# Patient Record
Sex: Female | Born: 1950 | Race: White | Hispanic: No | Smoking: Former smoker
Health system: Southern US, Community
[De-identification: ages and names within clinical notes are randomized; demographics above are authoritative.]

## PROBLEM LIST (undated history)

## (undated) DIAGNOSIS — M4800 Spinal stenosis, site unspecified: Secondary | ICD-10-CM

## (undated) DIAGNOSIS — C541 Malignant neoplasm of endometrium: Secondary | ICD-10-CM

## (undated) DIAGNOSIS — I639 Cerebral infarction, unspecified: Secondary | ICD-10-CM

## (undated) DIAGNOSIS — E119 Type 2 diabetes mellitus without complications: Secondary | ICD-10-CM

## (undated) DIAGNOSIS — G629 Polyneuropathy, unspecified: Secondary | ICD-10-CM

## (undated) DIAGNOSIS — I10 Essential (primary) hypertension: Secondary | ICD-10-CM

## (undated) HISTORY — PX: BACK SURGERY: SHX140

## (undated) HISTORY — PX: APPENDECTOMY: SHX54

---

## 2005-07-17 ENCOUNTER — Ambulatory Visit: Payer: Self-pay

## 2006-03-28 IMAGING — CT CT ABD-PELV W/O CM
1 of 2 series · 15 of 32 positions shown, 19 images · non-contrast
Comparison: none

REASON FOR EXAM: (1) Abdominal pain  non contrast right sided pain; (2)
abd pain    [HOSPITAL]
COMMENTS:

PROCEDURE:     CT  - CT ABDOMEN AND PELVIS W[DATE] [DATE]
RESULT:
HISTORY: Abdominal pain.

[Series 2: stone · axial · 0.65mm/px · z∈[-502,-104]mm · 15 of 150 slices shown, 19 images]
[im 11/150  soft-tissue]
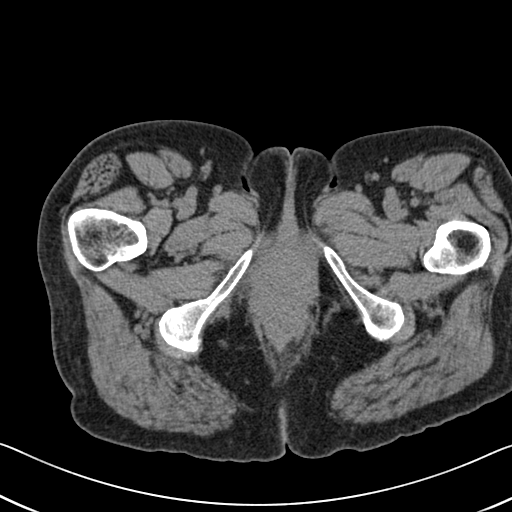
[im 11/150  bone]
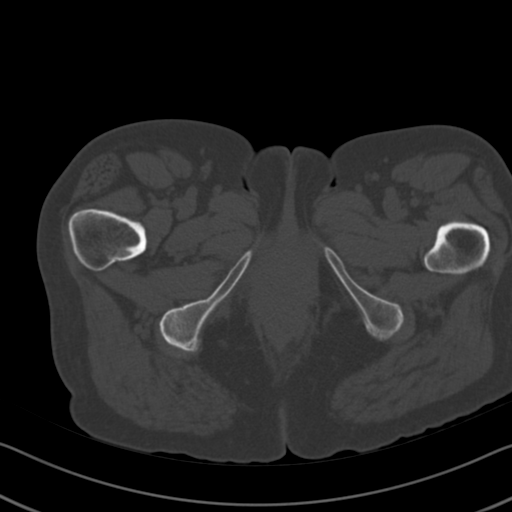
[im 22/150  soft-tissue]
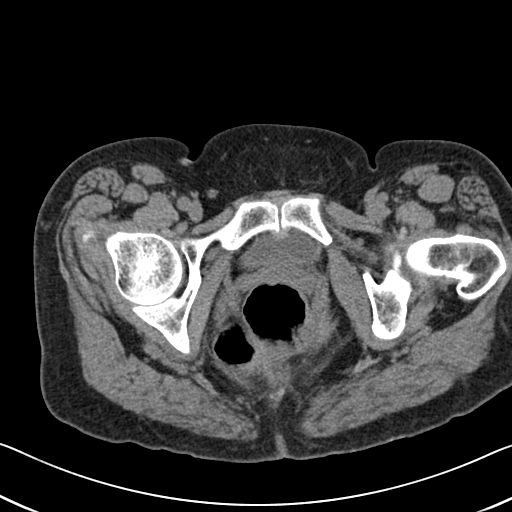
[im 32/150  soft-tissue]
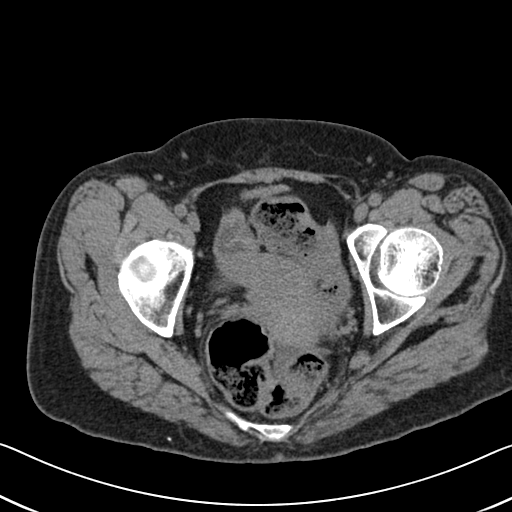
[im 43/150  soft-tissue]
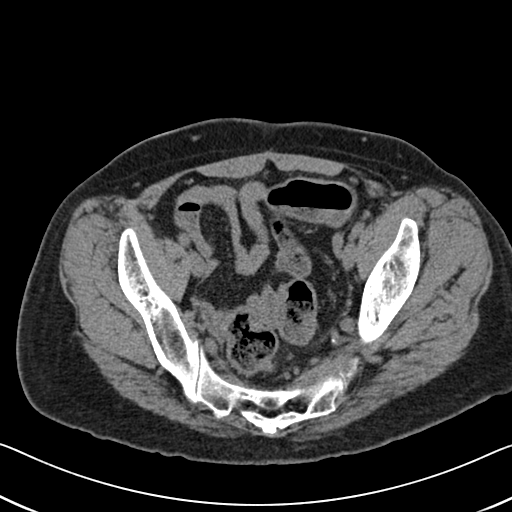
[im 54/150  soft-tissue]
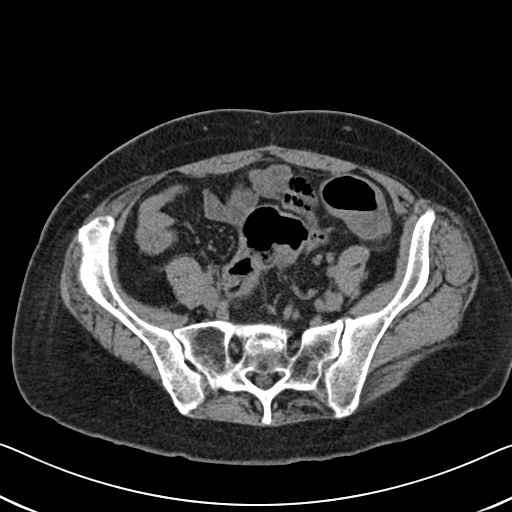
[im 64/150  soft-tissue]
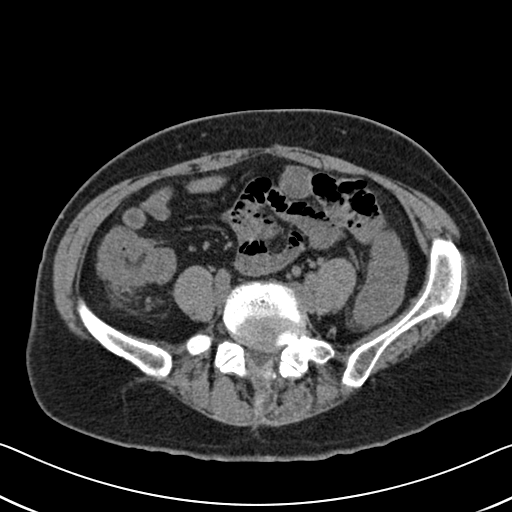
[im 75/150  soft-tissue]
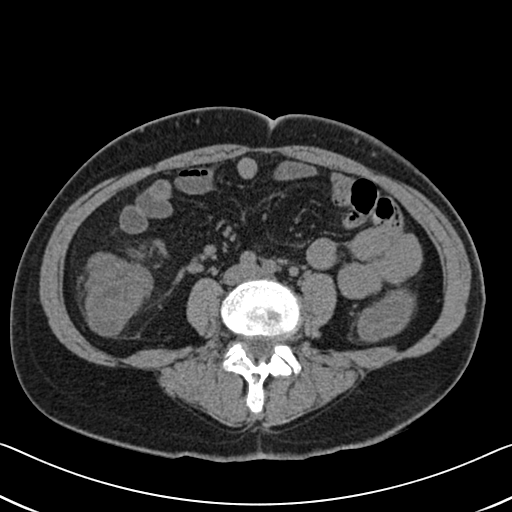
[im 86/150  soft-tissue]
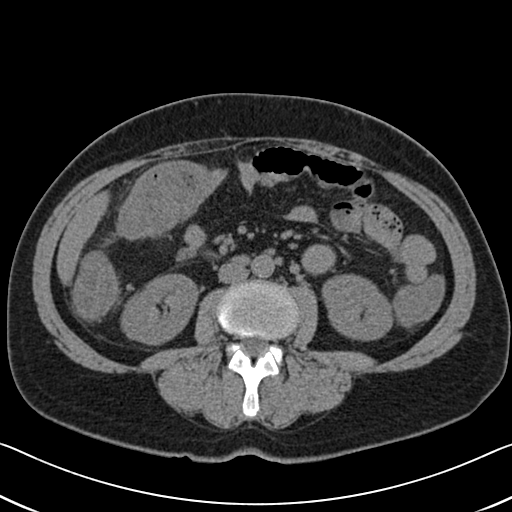
[im 96/150  soft-tissue]
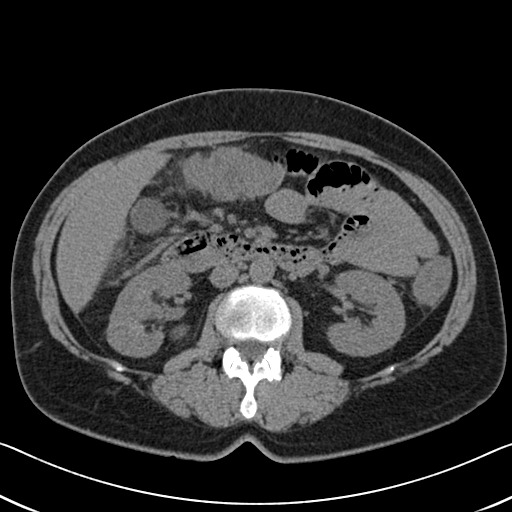
[im 96/150  bone]
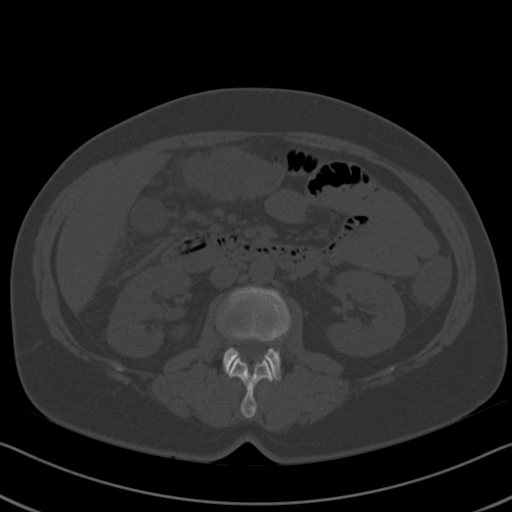
[im 107/150  soft-tissue]
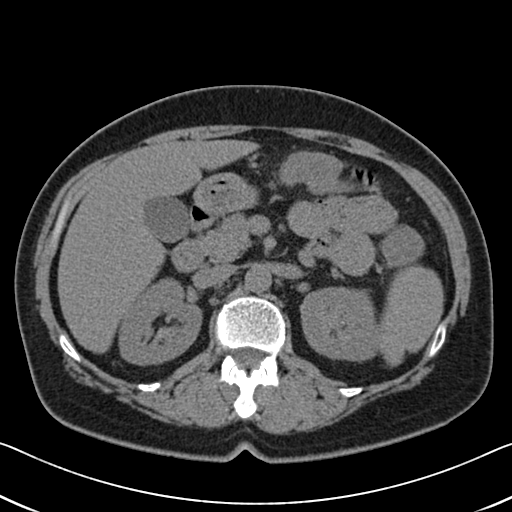
[im 118/150  soft-tissue]
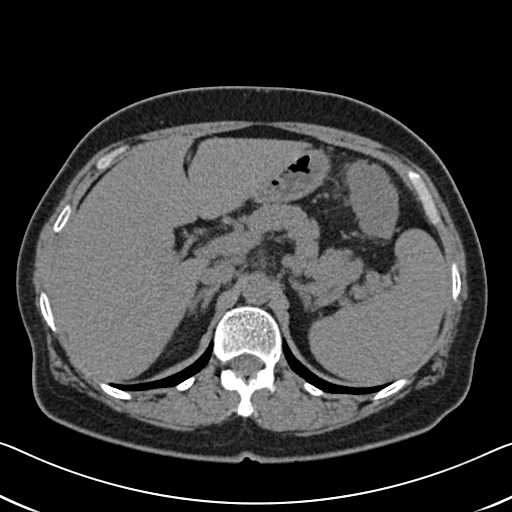
[im 128/150  soft-tissue]
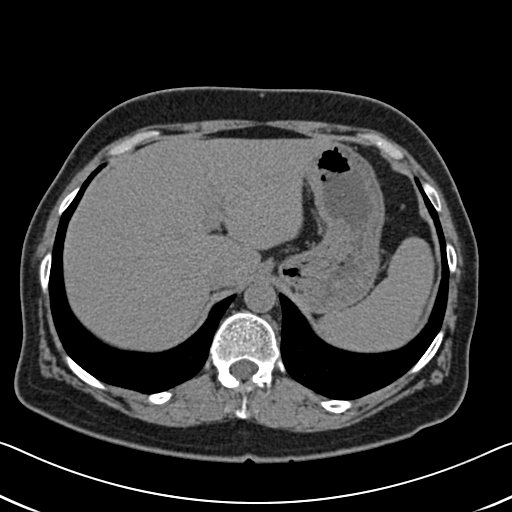
[im 128/150  lung]
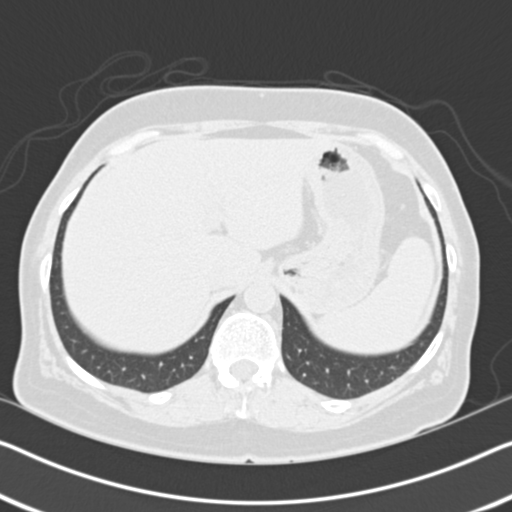
[im 134/150  lung]
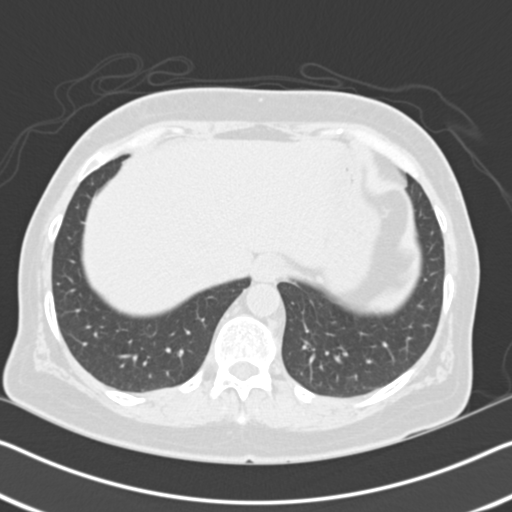
[im 139/150  soft-tissue]
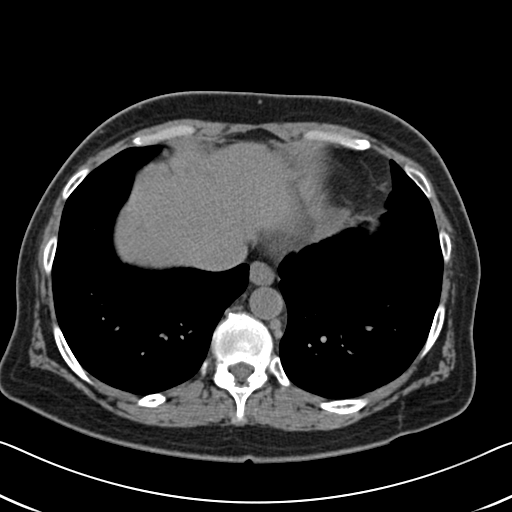
[im 139/150  lung]
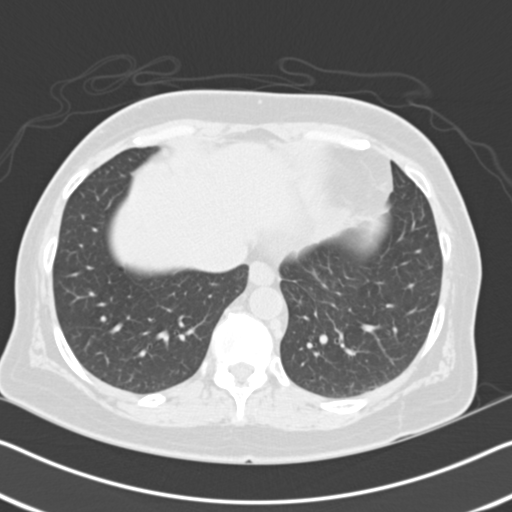
[im 144/150  lung]
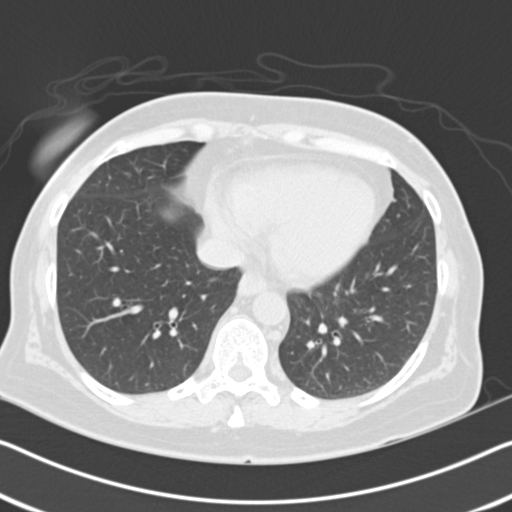

[15 of 32 positions shown; findings below may reference images not displayed]

PROCEDURE AND FINDINGS:   The liver and spleen are normal.  The pancreas is
normal.  The adrenals are normal.  No focal renal abnormalities are
identified.  There is no bowel distention.  Thickening of the RIGHT colonic
wall with pericolonic mesenteric inflammatory change is noted.   Thickening
of the transverse colonic wall and descending colonic wall noted.  Fluid is
noted in the cul-de-sac.  These changes could be related to colitis.  At
some point, colonoscopy may prove useful for further evaluation when
clinically indicated.  The appendix is unremarkable.  Surgical clips are
noted in the RIGHT adnexa.  No inguinal adenopathy is noted.   The lung
bases are clear.  No free air is noted.
IMPRESSION: 1.     RIGHT colonic, transverse colonic and descending colon wall
thickening with pericolonic mesenteric thickening suggesting colitis.
2.     Small amount of fluid noted in the cul-de-sac.
3.     No evidence of bowel obstruction or free air.

## 2006-03-29 ENCOUNTER — Inpatient Hospital Stay: Payer: Self-pay | Admitting: Internal Medicine

## 2012-11-04 ENCOUNTER — Observation Stay: Payer: Self-pay | Admitting: Internal Medicine

## 2012-11-04 DIAGNOSIS — R079 Chest pain, unspecified: Secondary | ICD-10-CM

## 2012-11-04 LAB — TROPONIN I
Troponin-I: <0.02 ng/mL
Troponin-I: <0.02 ng/mL

## 2012-11-04 LAB — BASIC METABOLIC PANEL
Co2: 23 mmol/L (ref 21–32)
Creatinine: 0.74 mg/dL (ref 0.60–1.30)
EGFR (Non-African Amer.): >60
Glucose: 229 mg/dL — ABNORMAL HIGH (ref 65–99)
Osmolality: 279 (ref 275–301)
Sodium: 136 mmol/L (ref 136–145)

## 2012-11-04 LAB — CBC
HCT: 43.2 % (ref 35.0–47.0)
HGB: 15.2 g/dL (ref 12.0–16.0)
MCH: 30.7 pg (ref 26.0–34.0)
RBC: 4.95 10*6/uL (ref 3.80–5.20)
RDW: 13.1 % (ref 11.5–14.5)
WBC: 8.1 10*3/uL (ref 3.6–11.0)

## 2012-11-04 LAB — CK TOTAL AND CKMB (NOT AT ARMC)
CK-MB: 1.2 ng/mL (ref 0.5–3.6)
CK-MB: 1.4 ng/mL (ref 0.5–3.6)

## 2012-11-04 IMAGING — CR DG CHEST 1V PORT
1 series · 1 of 1 positions shown · non-contrast
Comparison: none

REASON FOR EXAM: CP
COMMENTS:

PROCEDURE:     DXR - DXR PORTABLE CHEST SINGLE VIEW  - [DATE]  [DATE]
RESULT:     Mediastinum and hilar structures normal. Lungs clear. Heart size
normal.

[ap]
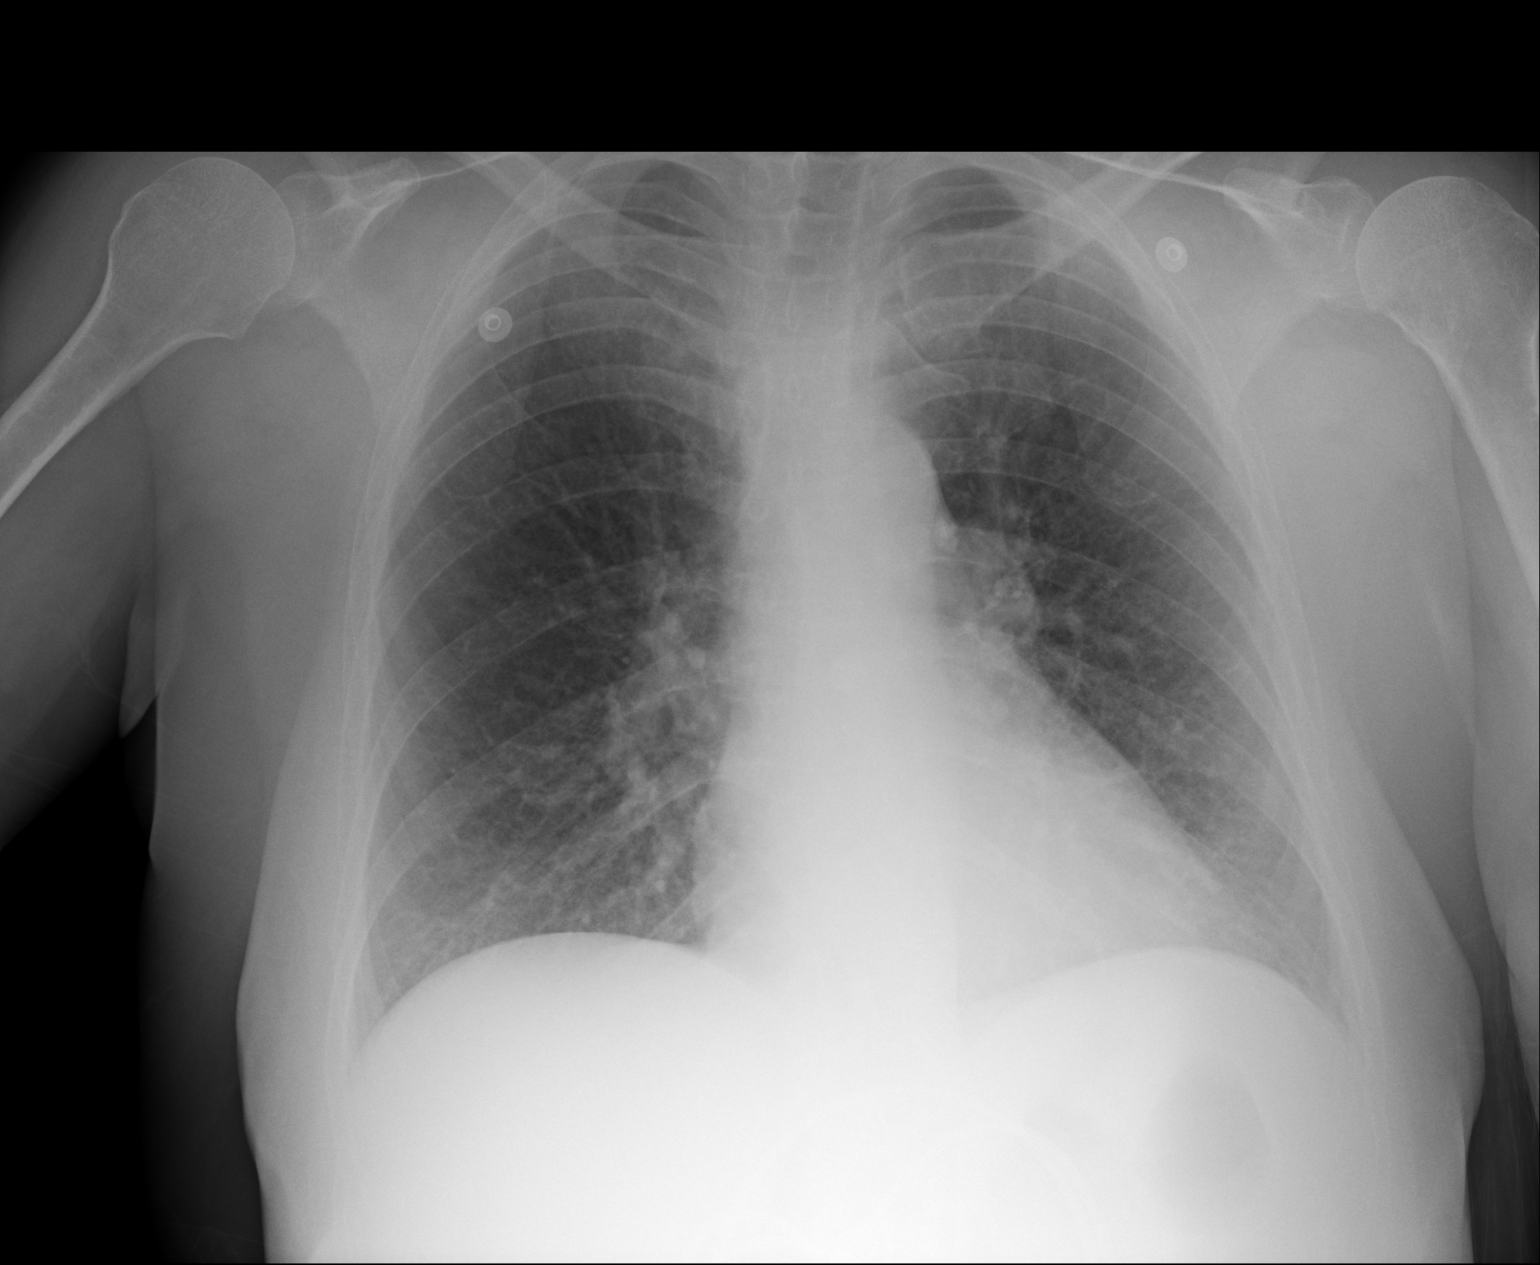

[1 of 1 positions shown; findings below may reference images not displayed]

IMPRESSION: No acute abnormality.

## 2012-11-20 ENCOUNTER — Emergency Department: Payer: Self-pay | Admitting: Emergency Medicine

## 2012-11-20 IMAGING — CR DG FOOT COMPLETE 3+V*L*
1 series · 3 of 3 positions shown · non-contrast
Comparison: none

REASON FOR EXAM: injury
COMMENTS:

PROCEDURE:     DXR - DXR FOOT LT COMP W/OBLIQUES  - [DATE] [DATE]
RESULT:     Three images of the left foot demonstrate no fracture,
dislocation or focal bony defect.

[Series 2: x foot obl left · 0.14mm/px · 3 of 3 slices shown]
[im 1/3]
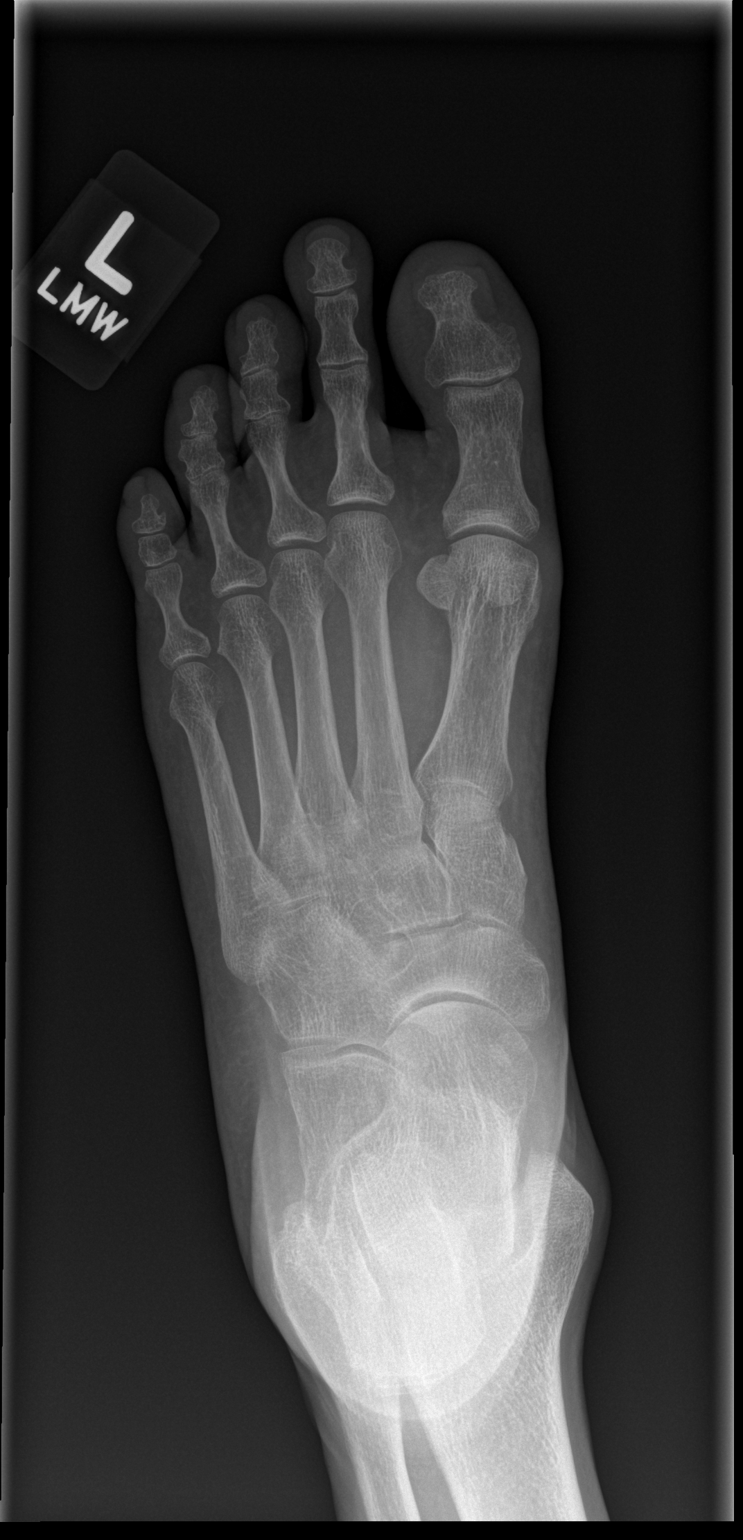
[im 2/3]
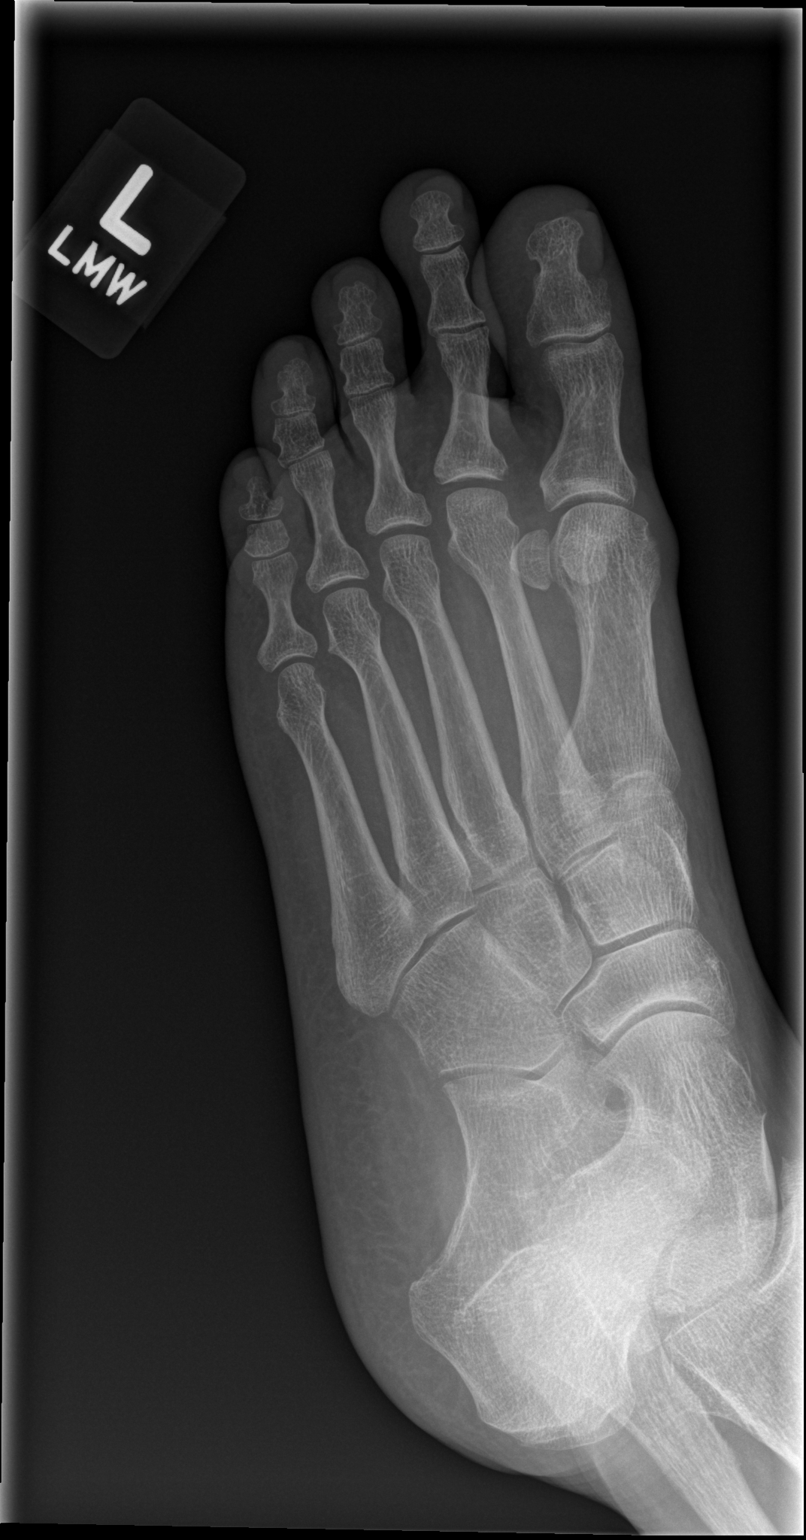
[im 3/3]
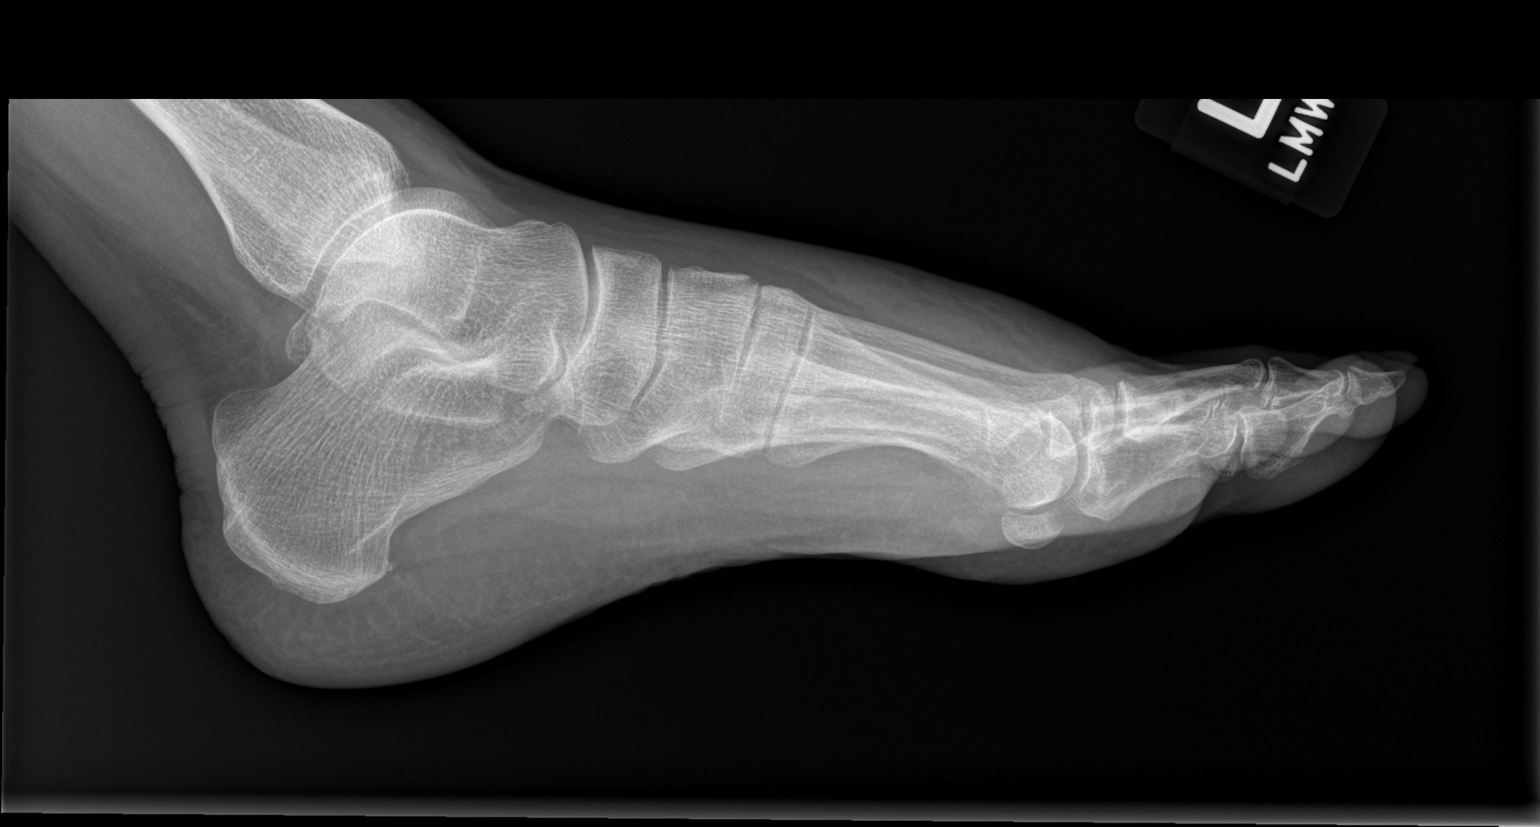

[3 of 3 positions shown; findings below may reference images not displayed]

IMPRESSION: No acute bony abnormality.

[REDACTED]

## 2014-03-05 HISTORY — PX: BACK SURGERY: SHX140

## 2014-03-20 ENCOUNTER — Emergency Department: Payer: Self-pay | Admitting: Emergency Medicine

## 2014-03-20 LAB — COMPREHENSIVE METABOLIC PANEL
ALT: 18 U/L
Albumin: 3.8 g/dL (ref 3.4–5.0)
Alkaline Phosphatase: 71 U/L
Anion Gap: 5 — ABNORMAL LOW (ref 7–16)
BUN: 15 mg/dL (ref 7–18)
Bilirubin,Total: 0.3 mg/dL (ref 0.2–1.0)
CREATININE: 0.71 mg/dL (ref 0.60–1.30)
Calcium, Total: 9.4 mg/dL (ref 8.5–10.1)
Chloride: 104 mmol/L (ref 98–107)
Co2: 30 mmol/L (ref 21–32)
EGFR (African American): >60
EGFR (Non-African Amer.): >60
Glucose: 137 mg/dL — ABNORMAL HIGH (ref 65–99)
Osmolality: 281 (ref 275–301)
POTASSIUM: 3.9 mmol/L (ref 3.5–5.1)
SGOT(AST): 18 U/L (ref 15–37)
SODIUM: 139 mmol/L (ref 136–145)
TOTAL PROTEIN: 7.5 g/dL (ref 6.4–8.2)

## 2014-03-20 LAB — URINALYSIS, COMPLETE
Bacteria: NONE SEEN
Bilirubin,UR: NEGATIVE
Blood: NEGATIVE
Glucose,UR: NEGATIVE mg/dL (ref 0–75)
Ketone: NEGATIVE
Leukocyte Esterase: NEGATIVE
NITRITE: NEGATIVE
PH: 6 (ref 4.5–8.0)
PROTEIN: NEGATIVE
SPECIFIC GRAVITY: 1.005 (ref 1.003–1.030)
Squamous Epithelial: <1

## 2014-03-20 LAB — CBC WITH DIFFERENTIAL/PLATELET
BASOS ABS: 0.1 10*3/uL (ref 0.0–0.1)
Basophil %: 0.8 %
EOS ABS: 0 10*3/uL (ref 0.0–0.7)
Eosinophil %: 0.1 %
HCT: 46.3 % (ref 35.0–47.0)
HGB: 15.4 g/dL (ref 12.0–16.0)
LYMPHS ABS: 1.5 10*3/uL (ref 1.0–3.6)
LYMPHS PCT: 24.9 %
MCH: 30.2 pg (ref 26.0–34.0)
MCHC: 33.3 g/dL (ref 32.0–36.0)
MCV: 91 fL (ref 80–100)
Monocyte #: 0.4 x10 3/mm (ref 0.2–0.9)
Monocyte %: 6.7 %
NEUTROS ABS: 4.1 10*3/uL (ref 1.4–6.5)
Neutrophil %: 67.5 %
Platelet: 194 10*3/uL (ref 150–440)
RBC: 5.11 10*6/uL (ref 3.80–5.20)
RDW: 13.6 % (ref 11.5–14.5)
WBC: 6.1 10*3/uL (ref 3.6–11.0)

## 2014-03-20 LAB — LIPASE, BLOOD: Lipase: 106 U/L (ref 73–393)

## 2014-03-20 LAB — TROPONIN I: Troponin-I: <0.02 ng/mL

## 2014-03-21 IMAGING — CT CT ABD-PELV W/ CM
2 of 6 series · 15 of 46 positions shown, 17 images · IV contrast (omnipaque)
Comparison: CT of the abdomen and pelvis performed [DATE], and
right upper quadrant abdominal ultrasound performed earlier today at

CLINICAL DATA: Acute onset of right upper quadrant abdominal pain,
diarrhea and nausea. Initial encounter.

EXAM:
CT ABDOMEN AND PELVIS WITH CONTRAST
TECHNIQUE: Multidetector CT imaging of the abdomen and pelvis was performed
using the standard protocol following bolus administration of
intravenous contrast.
CONTRAST:  100 mL of Omnipaque 300 IV contrast

[Series 3: routine abd pel with thins · axial · 0.67mm/px · z∈[-493,-45]mm · 12 of 331 slices shown, 14 images]
[im 16/331  soft-tissue]
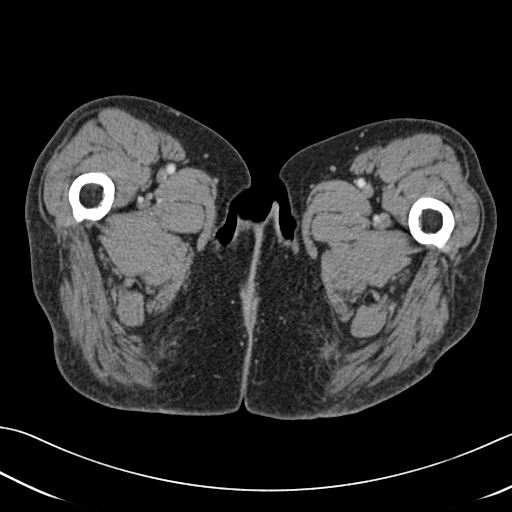
[im 16/331  bone]
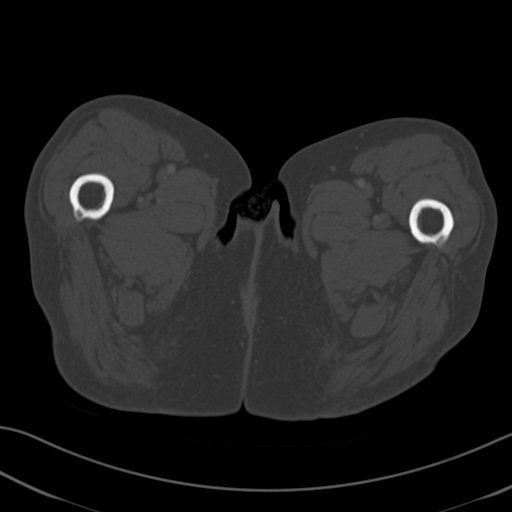
[im 48/331  soft-tissue]
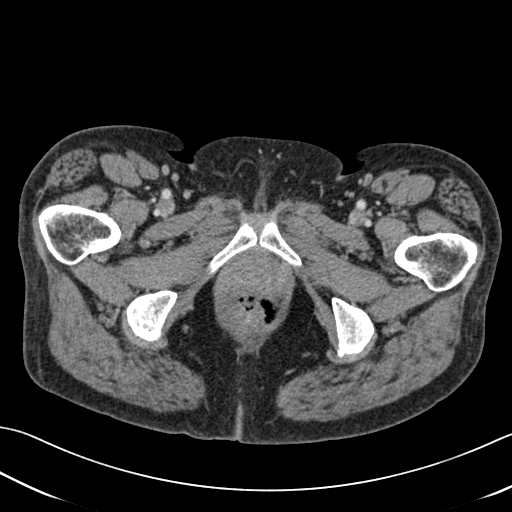
[im 79/331  soft-tissue]
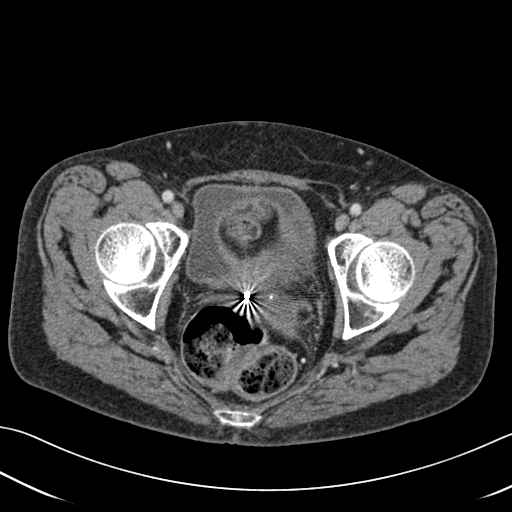
[im 95/331  soft-tissue]
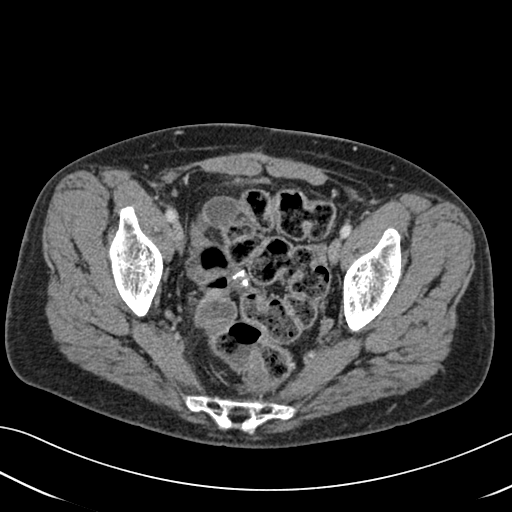
[im 126/331  soft-tissue]
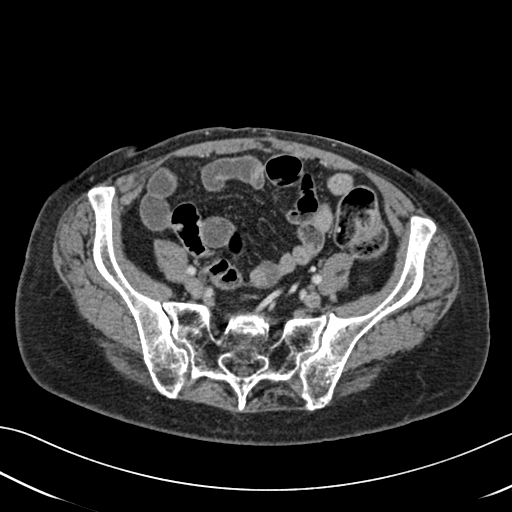
[im 158/331  soft-tissue]
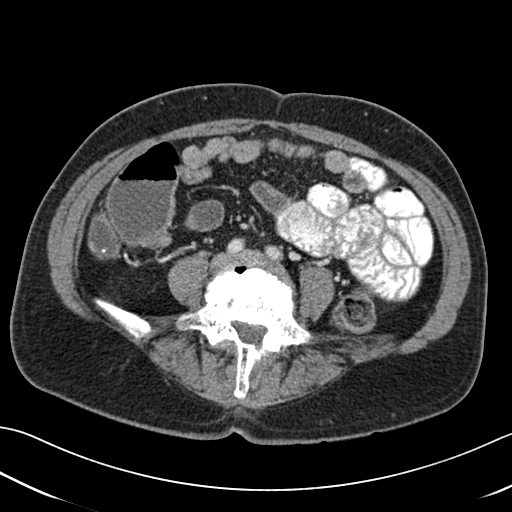
[im 173/331  soft-tissue]
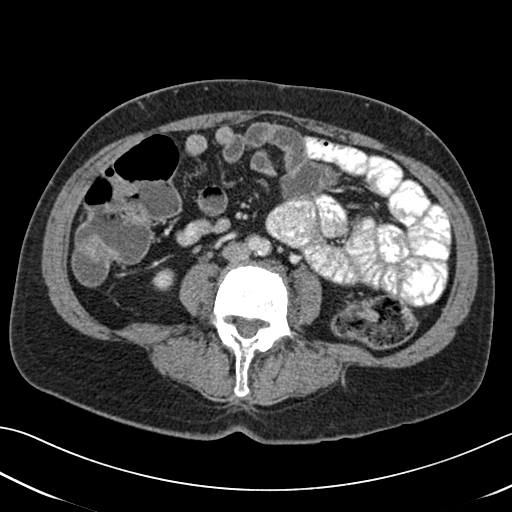
[im 205/331  soft-tissue]
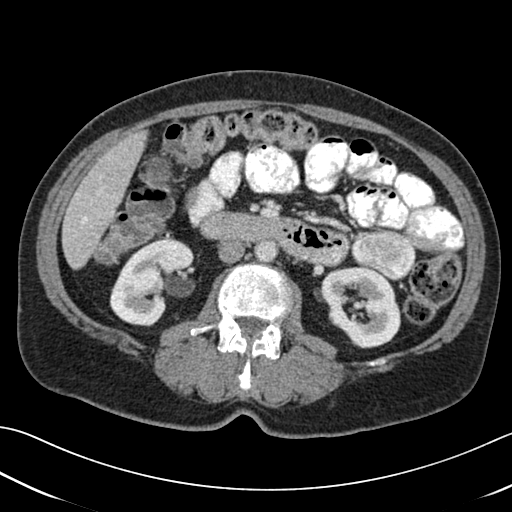
[im 236/331  soft-tissue]
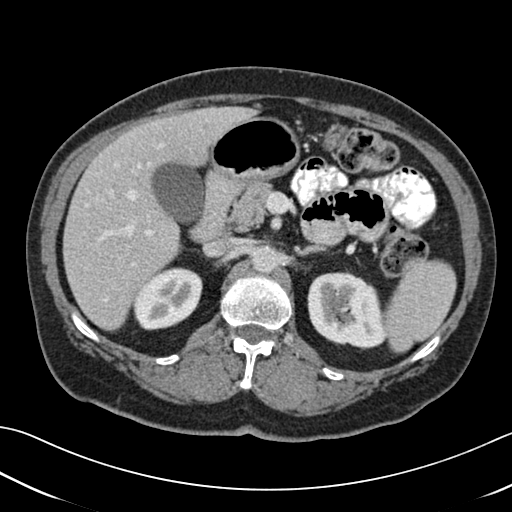
[im 236/331  bone]
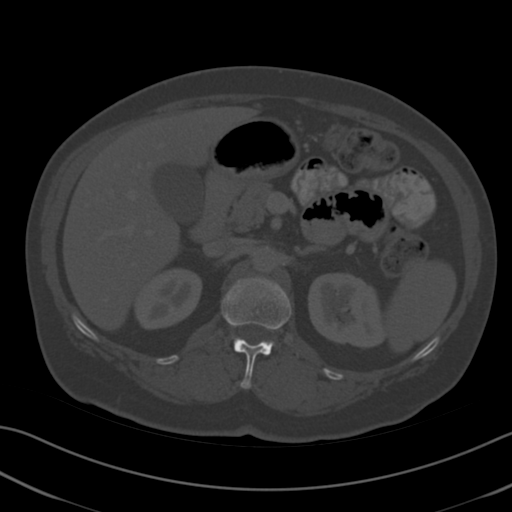
[im 252/331  soft-tissue]
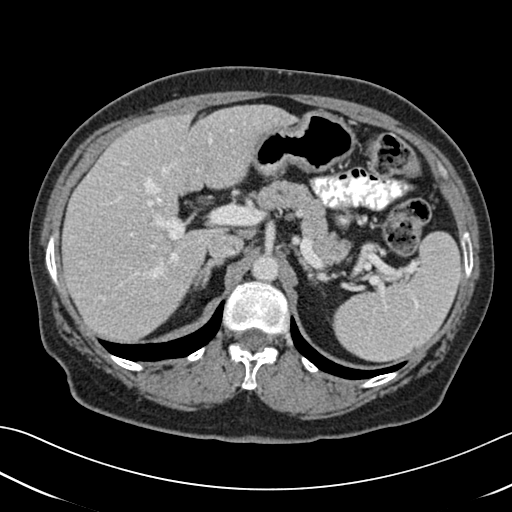
[im 283/331  soft-tissue]
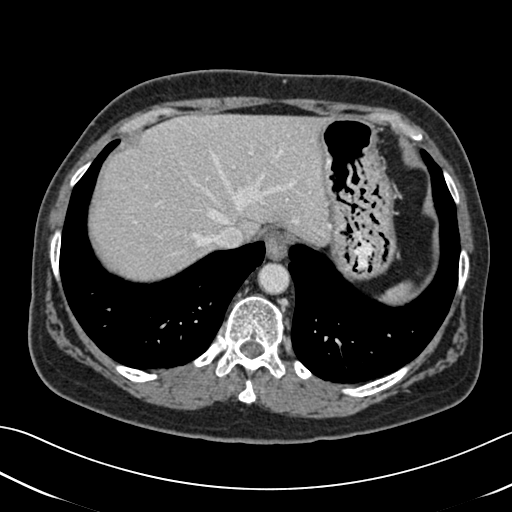
[im 315/331  soft-tissue]
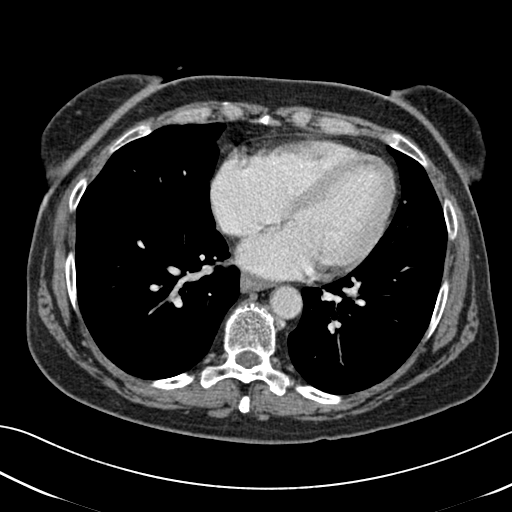

[Series 6: cor routine abd pel with · coronal · 0.68mm/px · 3 of 116 slices shown]
[im 39/116  soft-tissue]
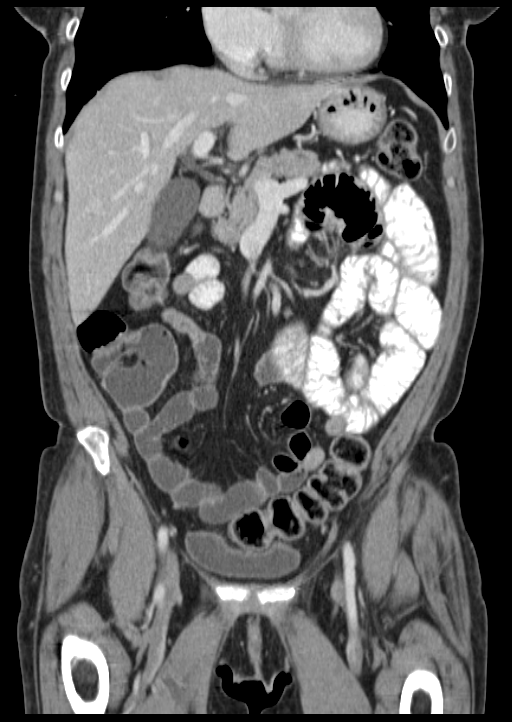
[im 52/116  soft-tissue]
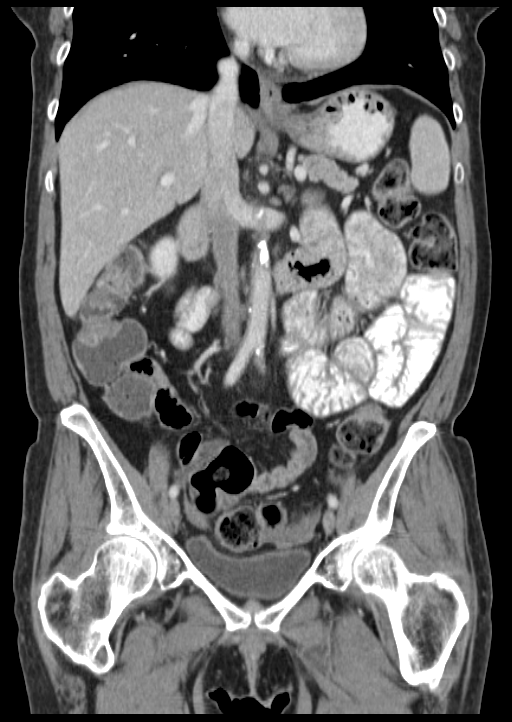
[im 64/116  soft-tissue]
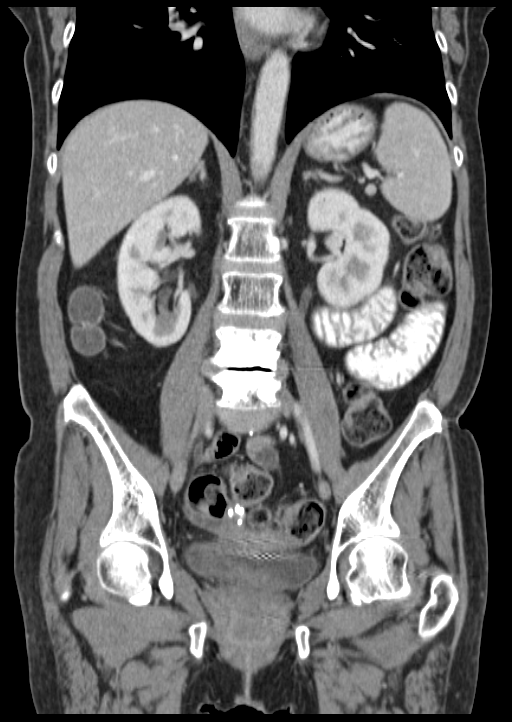

[15 of 46 positions shown; findings below may reference images not displayed]

FINDINGS: The visualized lung bases are clear.

The liver and spleen are unremarkable in appearance. The gallbladder
is within normal limits. The pancreas and adrenal glands are
unremarkable.

The kidneys are unremarkable in appearance. There is no evidence of
hydronephrosis. No renal or ureteral stones are seen. No perinephric
stranding is appreciated.

No free fluid is identified. The small bowel is unremarkable in
appearance. The stomach is within normal limits. No acute vascular
abnormalities are seen. Scattered calcification is seen along the
abdominal aorta and its branches.

The appendix is not definitely seen; there is no evidence for
appendicitis. The colon is partially filled with stool and is
unremarkable in appearance. The sigmoid colon is mildly redundant.

The bladder is decompressed and not well assessed. The patient is
status post hysterectomy. No suspicious adnexal masses are seen. No
inguinal lymphadenopathy is seen.

No acute osseous abnormalities are identified. Significant sclerotic
change is noted at the endplates at L4-L5, with vacuum phenomenon
and underlying left-sided disc protrusion.
IMPRESSION: 1. No acute abnormality seen to explain the patient's symptoms.
2. Scattered calcification along the abdominal aorta and its
branches.
3. Degenerative change at L4-L5, with underlying left-sided disc
protrusion.

## 2014-03-21 IMAGING — US ABDOMEN ULTRASOUND LIMITED
1 series · 14 of 25 positions shown · non-contrast
Comparison: CT of the abdomen and pelvis [DATE]

CLINICAL DATA: RIGHT upper quadrant pain for 3 hr, nausea.

EXAM:
US ABDOMEN LIMITED - RIGHT UPPER QUADRANT

[Series 1: abdomen ultrasound limited · 0.25mm/px · 14 of 45 slices shown]
[im 1/45]
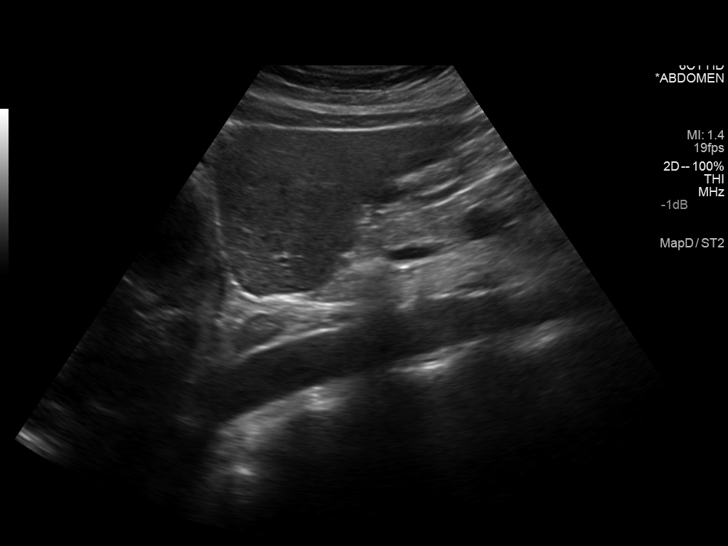
[im 4/45]
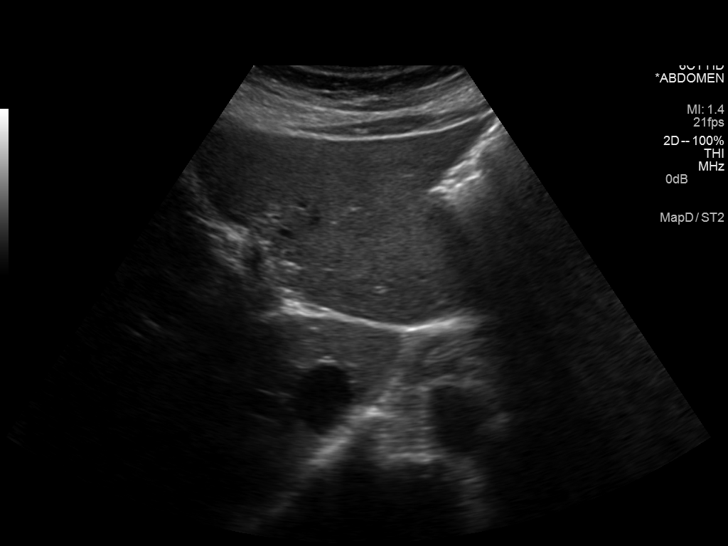
[im 8/45]
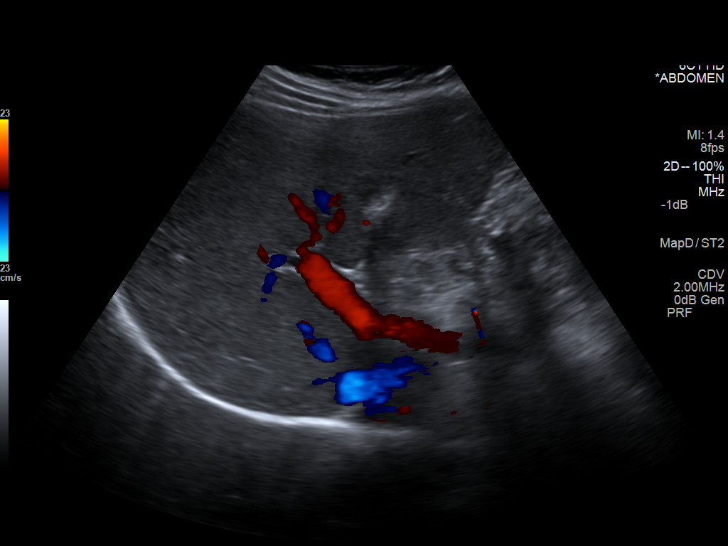
[im 12/45]
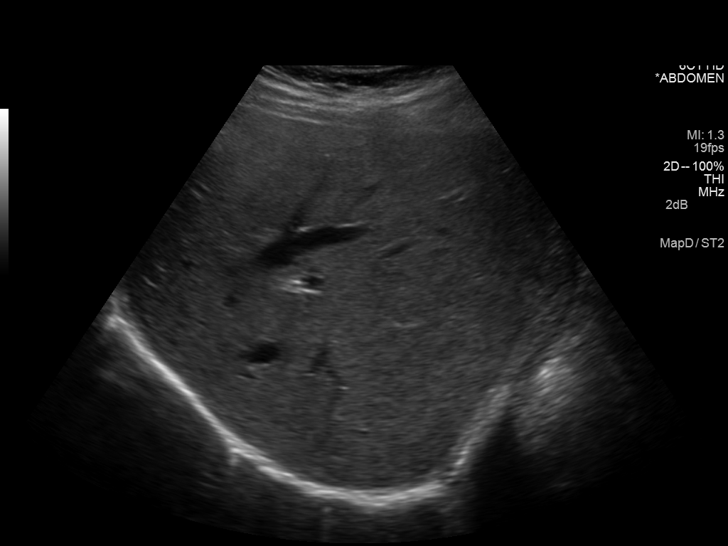
[im 15/45]
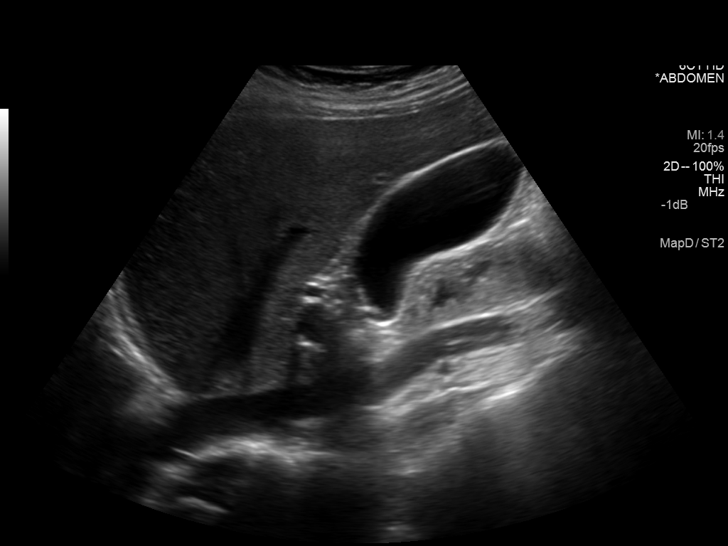
[im 17/45]
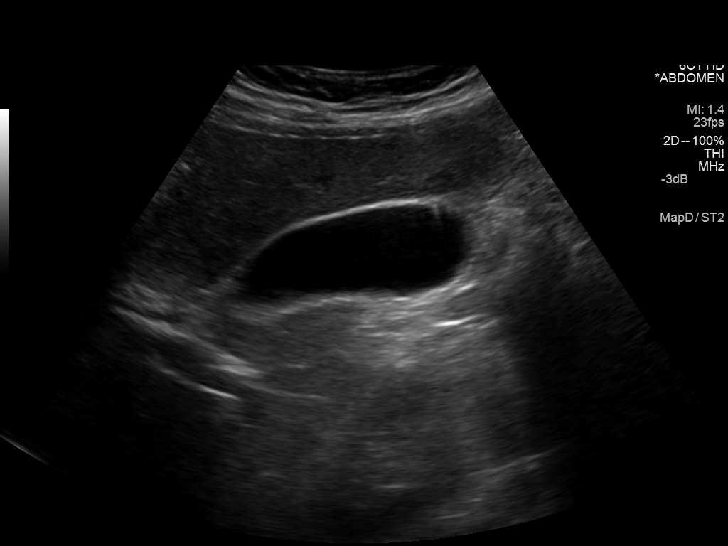
[im 21/45]
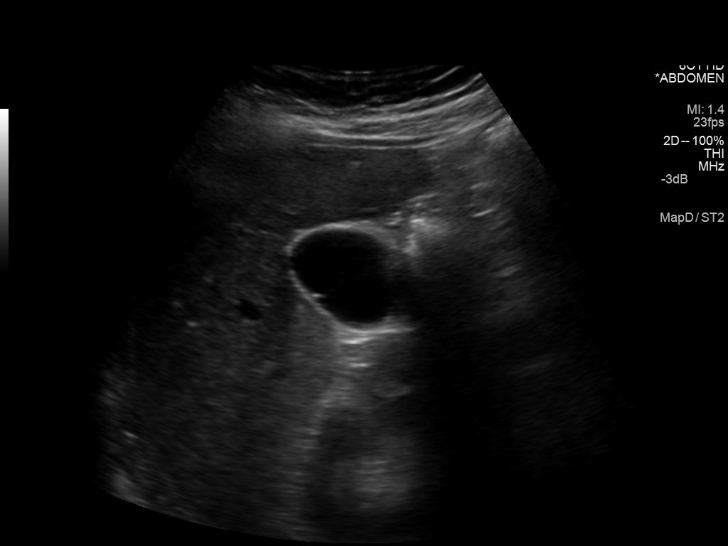
[im 24/45]
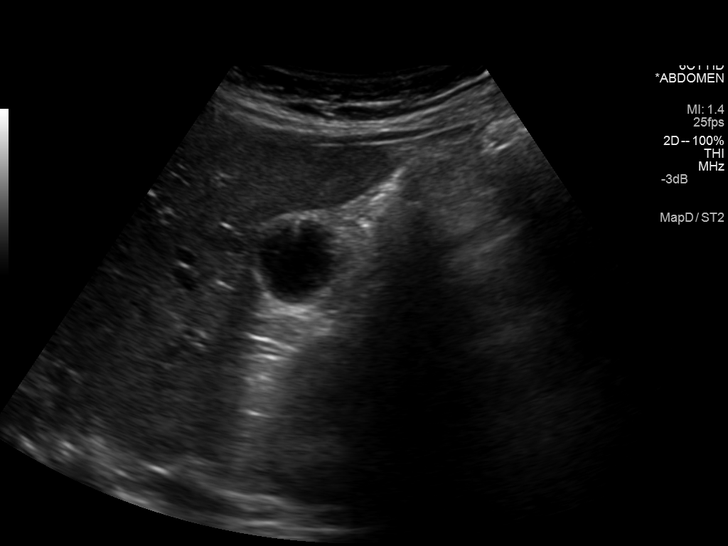
[im 28/45]
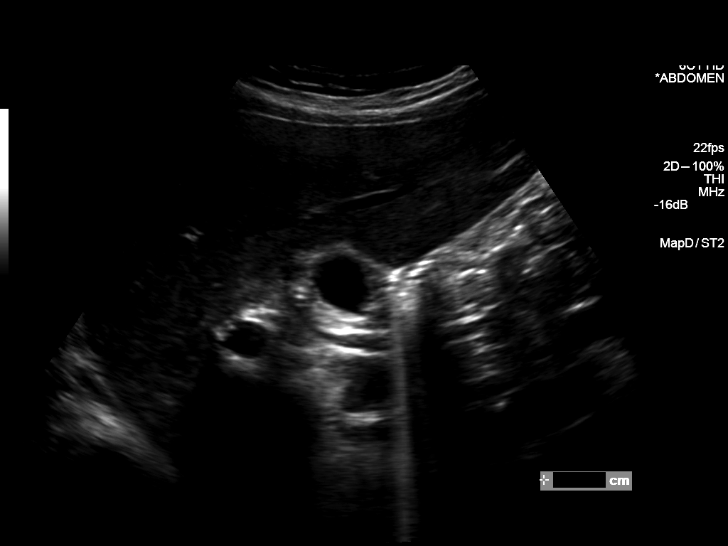
[im 30/45]
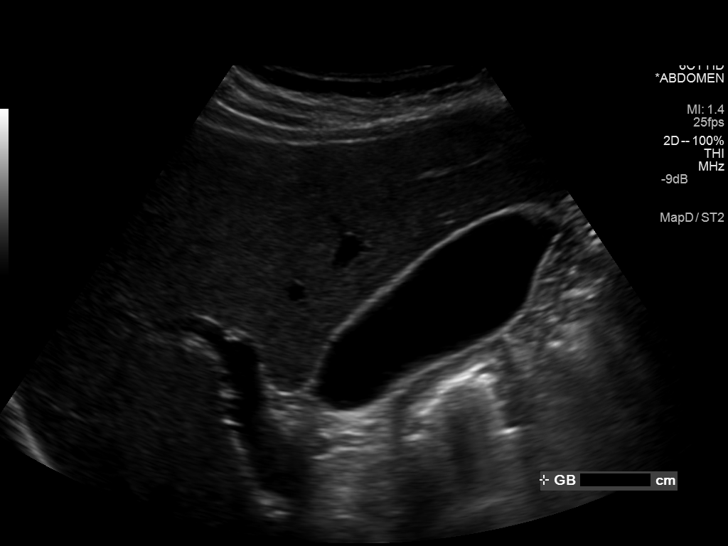
[im 34/45]
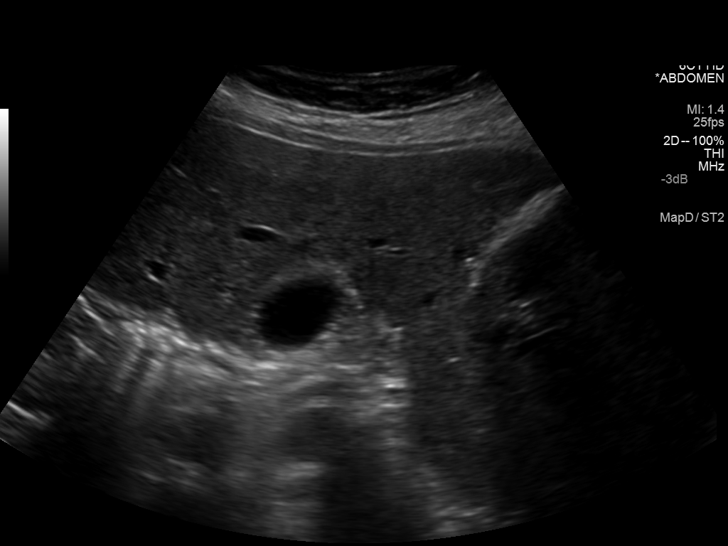
[im 37/45]
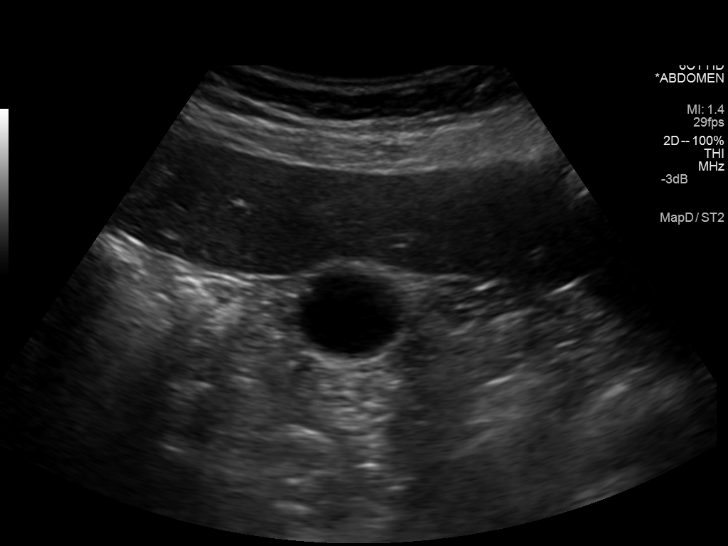
[im 41/45]
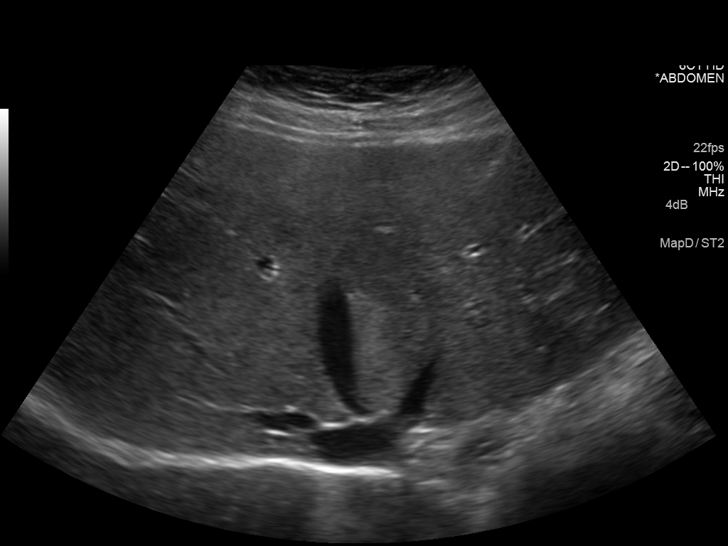
[im 45/45]
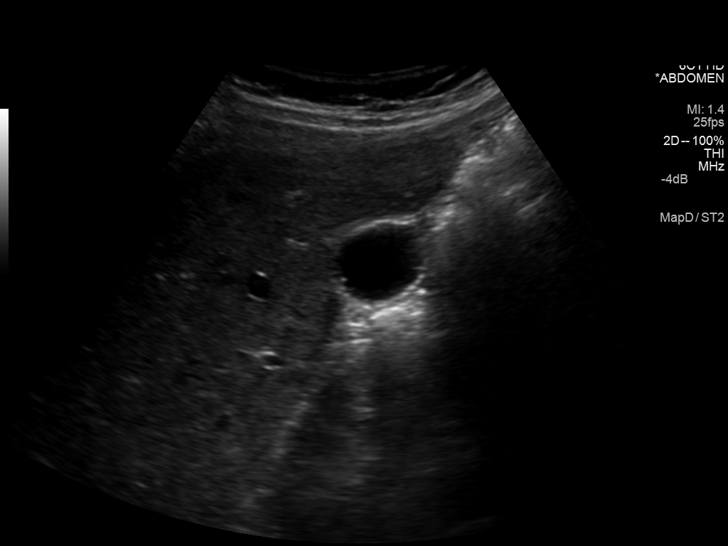

[14 of 25 positions shown; findings below may reference images not displayed]

FINDINGS: Gallbladder:

Echogenic nonmobile non shadowing mural based lesions measure up to
3 mm with comet tail artifact. No gallbladder with wall thickening
or pericholecystic fluid. No sonographic Murphy's sign elicited.

Common bile duct:

Diameter: 6 mm, normal for patient's age.

Liver:

Echogenic focal fat near the porta hepatis, the liver is otherwise
unremarkable. Hepatopetal portal vein.
IMPRESSION: Adenomyomatosis without convincing evidence of cholelithiasis ; no
acute cholecystitis.

  By: THEPZOMAN

## 2014-06-25 NOTE — H&P (Signed)
PATIENT NAME:  Cheyenne [name], Cheyenne [name] MR#:  [id number] DATE OF BIRTH:  1950/04/28  DATE OF ADMISSION:  11/04/2012  REFERRING PHYSICIAN:  Dr. Marjean Donna.  PRIMARY CARE PHYSICIAN:  [facility].   CHIEF COMPLAINT:  Chest pain.   HISTORY OF PRESENT ILLNESS: This is a 64 year old female with significant past medical history of hypertension, hyperlipidemia, type 2 diabetes, who presents with complaints of chest pain, started this evening at rest, as well as radiating to the neck and to the left shoulder. She reports some muscle spasm in the left shoulder as well but reported this as kind of a different pain, which prompted her to come to the ED. The patient had sublingual nitroglycerin at home where she took 1 tablet which helped resolve her pain. Currently she is chest pain-free. She reports she took 1 baby aspirin at home, as well, she received 4 baby aspirin here in the ED. Currently she is chest pain-free. She reports she had rest chest pain in the remote past, but she does not remember having any stress test or cardiac catheter done for it.  She reports she was given sublingual nitroglycerin in case it comes back. The patient's EKG did not have any acute findings. Her first troponin was negative. Hospitalist service was requested to admit the patient for further management and work-up of her chest pain. The patient reports some mild shortness of breath, mild nausea, mild sweating with chest pain but denies any palpitation or dizziness.   PAST MEDICAL HISTORY: 1.  Type 2 diabetes.  2.  Obesity.  3.  Hypertension.  4.  Dyslipidemia.  5.  Tobacco abuse.    ALLERGIES: CODEINE.   HOME MEDICATIONS: 1. Enalapril 10 mg oral daily.  2.  Sublingual nitroglycerin as needed.  3.  Citalopram 20 mg oral daily.  4.  Lantus 15 units subcutaneous daily.  5.  Metformin 1 tablet 500 mg oral 2 times a day.  6.  Taking aspirin or Tylenol as needed, not on a regular basis.   SOCIAL HISTORY: The patient  currently smokes 1 pack per day. No history of illicit drug use. No history of alcohol abuse.   FAMILY HISTORY: Significant for coronary artery disease in the family, but none at young age.   REVIEW OF SYSTEMS: GENERAL: Denies fever, chills, weakness, fatigue, weight gain, weight loss.  EYES: Denies blurry vision, double vision, inflammation, glaucoma.  ENT: Denies tinnitus, ear pain, epistaxis or discharge.  RESPIRATORY: Complains of mild shortness of breath, currently resolved. Denies any cough, wheezing, palpitations, COPD.  CARDIOVASCULAR: Had episode of chest pain resolved. Denies any edema, arrhythmia, palpitations, syncope.  GASTROINTESTINAL: Had some nausea but denies vomiting, diarrhea, abdominal pain, hematemesis, melena, jaundice.  GENITOURINARY: Denies dysuria, hematuria or renal colic.  ENDOCRINE:  Denies polyuria, polydipsia, heat or cold intolerance.  HEMATOLOGY: Denies anemia, easy bruising, bleeding diathesis.  INTEGUMENTARY: Denies acne, rash or skin lesions.  MUSCULOSKELETAL: Complains of neck spasm and shoulder spasm. Denies any arthritis, gout.  NEUROLOGIC: Denies CVA, TIA, seizures, headache or ataxia, vertigo.  PSYCHIATRIC: Denies anxiety, insomnia, schizophrenia, substance or alcohol abuse.   PHYSICAL EXAMINATION: VITAL SIGNS: Temperature 98.2, pulse 66, respiratory rate 18, blood pressure 132/70, saturating 98% on room air.  GENERAL: Older, well-nourished female who looks comfortable in no apparent distress.  HEENT: Head normocephalic. Pupils equal, reactive to light. Pink conjunctivae. Anicteric sclerae. Moist oral mucosa.  NECK: Supple. No thyromegaly. No JVD.  CHEST: Good air entry bilaterally. No wheezing, rales or rhonchi.  CARDIOVASCULAR:  S1, S2 heard. No rubs, murmurs or gallops.  ABDOMEN: Soft, nontender, nondistended. Bowel sounds present.  EXTREMITIES: No edema. No clubbing. No cyanosis. Dorsalis pedis pulse +2 bilaterally.  SKIN: No rash. Moist and  warm.  PSYCHIATRIC: Appropriate affect. Awake, alert x 3. Intact judgment and insight.  NEUROLOGIC: Cranial nerves grossly intact. Motor 5 out of 5.  LYMPHATICS: No cervical or supraclavicular lymphadenopathy.   PERTINENT LABORATORY DATA: Glucose 229, BUN 12, creatinine 0.74, sodium 136, potassium 3.8, chloride 106. Troponin less than 0.02. White blood cell 8.1, hemoglobin 15.2, hematocrit 43.2, platelets 221.   EKG showing normal sinus rhythm without ST or Q-wave changes at 69 beats per minute.  ASSESSMENT AND PLAN: 1.  Chest pain. The patient is currently chest pain-free. She received 324 of aspirin. Given the fact she has multiple risk factors and her chest pain resolved after sublingual nitroglycerin, she will be admitted to telemetry. We will continue to cycle her cardiac enzymes. If next one is negative, we will schedule her for a stress test. We will have her on p.r.n. sublingual nitroglycerin, and we will start her on baby aspirin daily.  2.  Tobacco abuse: The patient was counseled. She will be started on NicoDerm patch.  3.  Diabetes mellitus. We will hold Lantus and oral hypoglycemic agents. We will have her on insulin sliding scale as she will be n.p.o. for her stress test in the morning.  4.  Hypertension. Acceptable. Continue with home meds.  5.  Dyslipidemia. Unclear why the patient is not on any statins. By reviewing her old records, she used to be on Lipitor, so will resume her back on it.  6.  Deep vein thrombosis prophylaxis. Subcutaneous heparin.  7.  CODE STATUS: FULL CODE.   Total time spent on admission and patient care: 50 minutes.    ____________________________ Albertine Patricia, MD dse:dp D: 11/04/2012 04:23:50 ET T: 11/04/2012 06:01:18 ET JOB#: [id number]  cc: Albertine Patricia, MD, <Dictator> Ledia Hanford Graciela Husbands MD ELECTRONICALLY SIGNED 11/04/2012 23:23

## 2014-06-25 NOTE — Discharge Summary (Signed)
PATIENT NAME:  Cheyenne [name], Cheyenne [name] MR#:  [id number] DATE OF BIRTH:  1950-06-03  DATE OF ADMISSION:  11/04/2012 DATE OF DISCHARGE:  11/04/2012  PRIMARY CARE PHYSICIAN: None local.  DISCHARGE DIAGNOSES: 1.  Atypical chest pain.  2.  Hypertension.  3.  Diabetes.  4.  Hyperlipidemia.   CODE STATUS:  FULL CODE.     HOME MEDICATIONS:   1.  Enalapril 10 mg p.o. daily.  2.  NitroQuick 0.4 mg sublingual tablet, 1 tablet sublingual every 5 minutes p.r.n. for chest pain. 3.  Citalopram 20mg   p.o. daily. 4.  Promethazine tablet 12.5 mg p.o. q. 6 hours p.r.n.  5.  Lantus 15 units subcu b.i.d.  6.  Metformin 500 mg p.o. b.i.d.  7.  Atorvastatin 10 mg p.o. at bedtime.  8.  Aspirin 81 mg p.o. daily. 9.  Nicoderm patch 21 mg per 24 hours transdermal film, 1 patch transdermal once a day. 10.   Lopressor 25 mg p.o. tablet, 0.5 tablet b.i.d.   DIET: Low sodium, low fat, ADA diet.   ACTIVITY: As tolerated.   FOLLOWUP CARE: Follow up with PCP within 1 to 2 weeks. The patient needs smoking cessation exercise.   REASON FOR ADMISSION: Chest pain.   HOSPITAL COURSE: The patient is a 64 year old Caucasian female with a history of hypertension, hyperlipidemia, type 2 diabetes, presented to the ED with sudden chest pain on the left side with left shoulder pain.  For detailed history and physical examination, please refer to the admission note dictated by Dr. Waldron Labs.  The patient was admitted for chest pain.  Laboratory data on admission date showed glucose 229, BUN 12, creatinine 0.74.  Troponin was less than 0.02. The patient was treated with aspirin 324 mg p.o.  In addition, the patient was treated with a pressor, enalapril, and a statin.  The patient underwent a stress test which was negative. The patient also has tobacco abuse.  She was counseled for smoking cessation. After admission, the patient had no chest pain . She was clinically stable and was discharged to home on September 2nd. The patient was  admitted on Sept 2 and was discharged on the same day. I discussed the patient's discharge plan with the patient, nurse and case manager.   TIME SPENT: About 32 minutes.   ____________________________ Demetrios Loll, MD qc:cb D: 11/11/2012 18:28:40 ET T: 11/11/2012 20:23:57 ET JOB#: [id number]  cc: Demetrios Loll, MD, <Dictator> Demetrios Loll MD ELECTRONICALLY SIGNED 11/12/2012 15:03

## 2015-12-16 ENCOUNTER — Other Ambulatory Visit: Payer: Self-pay | Admitting: Pharmacist

## 2015-12-16 NOTE — Patient Outreach (Signed)
Outreach call to [employer] regarding her request for follow up from the [facility] Medication Adherence Campaign. Call and speak to Ms. Huffine, but patient reports that she is just heading out the door. Let Ms. Evarts know that I will try her again next week.  Harlow Asa, PharmD Clinical Pharmacist [place] [facility] [phone]

## 2015-12-19 ENCOUNTER — Other Ambulatory Visit: Payer: Self-pay | Admitting: Pharmacist

## 2015-12-19 NOTE — Patient Outreach (Signed)
Outreach call to [employer] regarding her request for follow up from the [facility] Medication Adherence Campaign. Called and spoke with patient. HIPAA identifiers verified and verbal consent received.  Ms. Edgeman reports that she takes both her atorvastatin and enalapril as directed. Reports that she does not typically miss a dose. However, reports that occasionally she will miss a couple of doses if she is waiting to have enough money to afford a refill of her medications. Reports that she currently uses [facility]. Discuss with patient the importance of medications adherence. Patient verbalizes understanding. Discuss with patient the option of using mail order pharmacy for cost savings. Counsel patient on the process of using mail order pharmacy. Patient expresses appreciation for this information. Provide patient with the phone numbers for [facility] mail order and the [facility] over the counter benefit line.   Ms. Lucke reports that she has no further medication questions/concerns for me today. Provide patient with my phone number.  Harlow Asa, PharmD Clinical Pharmacist [place] [facility] [phone]

## 2017-10-09 DIAGNOSIS — E1151 Type 2 diabetes mellitus with diabetic peripheral angiopathy without gangrene: Secondary | ICD-10-CM | POA: Diagnosis not present

## 2017-10-09 DIAGNOSIS — E1136 Type 2 diabetes mellitus with diabetic cataract: Secondary | ICD-10-CM | POA: Diagnosis not present

## 2017-10-09 DIAGNOSIS — Z794 Long term (current) use of insulin: Secondary | ICD-10-CM | POA: Diagnosis not present

## 2017-10-09 DIAGNOSIS — E1142 Type 2 diabetes mellitus with diabetic polyneuropathy: Secondary | ICD-10-CM | POA: Diagnosis not present

## 2017-10-09 DIAGNOSIS — R69 Illness, unspecified: Secondary | ICD-10-CM | POA: Diagnosis not present

## 2017-10-09 DIAGNOSIS — G4700 Insomnia, unspecified: Secondary | ICD-10-CM | POA: Diagnosis not present

## 2017-10-09 DIAGNOSIS — G8929 Other chronic pain: Secondary | ICD-10-CM | POA: Diagnosis not present

## 2017-10-09 DIAGNOSIS — J309 Allergic rhinitis, unspecified: Secondary | ICD-10-CM | POA: Diagnosis not present

## 2017-10-09 DIAGNOSIS — I10 Essential (primary) hypertension: Secondary | ICD-10-CM | POA: Diagnosis not present

## 2017-10-20 DIAGNOSIS — R001 Bradycardia, unspecified: Secondary | ICD-10-CM | POA: Diagnosis not present

## 2017-10-20 DIAGNOSIS — R0682 Tachypnea, not elsewhere classified: Secondary | ICD-10-CM | POA: Diagnosis not present

## 2017-10-20 DIAGNOSIS — R69 Illness, unspecified: Secondary | ICD-10-CM | POA: Diagnosis not present

## 2017-10-20 DIAGNOSIS — K529 Noninfective gastroenteritis and colitis, unspecified: Secondary | ICD-10-CM | POA: Diagnosis not present

## 2017-10-20 DIAGNOSIS — R1084 Generalized abdominal pain: Secondary | ICD-10-CM | POA: Diagnosis not present

## 2017-10-20 DIAGNOSIS — R0902 Hypoxemia: Secondary | ICD-10-CM | POA: Diagnosis not present

## 2017-10-20 DIAGNOSIS — A09 Infectious gastroenteritis and colitis, unspecified: Secondary | ICD-10-CM | POA: Diagnosis not present

## 2017-10-20 DIAGNOSIS — R109 Unspecified abdominal pain: Secondary | ICD-10-CM | POA: Diagnosis not present

## 2017-10-20 DIAGNOSIS — R188 Other ascites: Secondary | ICD-10-CM | POA: Diagnosis not present

## 2017-10-20 DIAGNOSIS — R10819 Abdominal tenderness, unspecified site: Secondary | ICD-10-CM | POA: Diagnosis not present

## 2017-10-20 DIAGNOSIS — R0602 Shortness of breath: Secondary | ICD-10-CM | POA: Diagnosis not present

## 2017-10-20 DIAGNOSIS — Z885 Allergy status to narcotic agent status: Secondary | ICD-10-CM | POA: Diagnosis not present

## 2017-10-20 DIAGNOSIS — C539 Malignant neoplasm of cervix uteri, unspecified: Secondary | ICD-10-CM | POA: Diagnosis not present

## 2017-10-20 DIAGNOSIS — E119 Type 2 diabetes mellitus without complications: Secondary | ICD-10-CM | POA: Diagnosis not present

## 2017-10-20 DIAGNOSIS — I10 Essential (primary) hypertension: Secondary | ICD-10-CM | POA: Diagnosis not present

## 2017-10-20 DIAGNOSIS — R197 Diarrhea, unspecified: Secondary | ICD-10-CM | POA: Diagnosis not present

## 2017-10-23 DIAGNOSIS — E119 Type 2 diabetes mellitus without complications: Secondary | ICD-10-CM | POA: Diagnosis not present

## 2017-10-23 DIAGNOSIS — Z8541 Personal history of malignant neoplasm of cervix uteri: Secondary | ICD-10-CM | POA: Diagnosis not present

## 2017-10-23 DIAGNOSIS — Z1231 Encounter for screening mammogram for malignant neoplasm of breast: Secondary | ICD-10-CM | POA: Diagnosis not present

## 2017-10-23 DIAGNOSIS — I10 Essential (primary) hypertension: Secondary | ICD-10-CM | POA: Diagnosis not present

## 2017-10-23 DIAGNOSIS — Z0000 Encounter for general adult medical examination without abnormal findings: Secondary | ICD-10-CM | POA: Diagnosis not present

## 2017-10-23 DIAGNOSIS — K529 Noninfective gastroenteritis and colitis, unspecified: Secondary | ICD-10-CM | POA: Diagnosis not present

## 2017-10-23 DIAGNOSIS — R69 Illness, unspecified: Secondary | ICD-10-CM | POA: Diagnosis not present

## 2017-11-07 DIAGNOSIS — Z7982 Long term (current) use of aspirin: Secondary | ICD-10-CM | POA: Diagnosis not present

## 2017-11-07 DIAGNOSIS — Z79899 Other long term (current) drug therapy: Secondary | ICD-10-CM | POA: Diagnosis not present

## 2017-11-07 DIAGNOSIS — I10 Essential (primary) hypertension: Secondary | ICD-10-CM | POA: Diagnosis not present

## 2017-11-07 DIAGNOSIS — E785 Hyperlipidemia, unspecified: Secondary | ICD-10-CM | POA: Diagnosis not present

## 2017-11-07 DIAGNOSIS — Z794 Long term (current) use of insulin: Secondary | ICD-10-CM | POA: Diagnosis not present

## 2017-11-07 DIAGNOSIS — Z8541 Personal history of malignant neoplasm of cervix uteri: Secondary | ICD-10-CM | POA: Diagnosis not present

## 2017-11-07 DIAGNOSIS — K529 Noninfective gastroenteritis and colitis, unspecified: Secondary | ICD-10-CM | POA: Diagnosis not present

## 2017-11-07 DIAGNOSIS — Z1231 Encounter for screening mammogram for malignant neoplasm of breast: Secondary | ICD-10-CM | POA: Diagnosis not present

## 2017-11-07 DIAGNOSIS — E1142 Type 2 diabetes mellitus with diabetic polyneuropathy: Secondary | ICD-10-CM | POA: Diagnosis not present

## 2017-11-07 DIAGNOSIS — R69 Illness, unspecified: Secondary | ICD-10-CM | POA: Diagnosis not present

## 2017-11-07 DIAGNOSIS — Z885 Allergy status to narcotic agent status: Secondary | ICD-10-CM | POA: Diagnosis not present

## 2017-11-27 ENCOUNTER — Emergency Department
Admission: EM | Admit: 2017-11-27 | Discharge: 2017-11-27 | Disposition: A | Discharge status: Home / Self Care | Payer: MEDICARE | Attending: Emergency Medicine | Admitting: Emergency Medicine

## 2017-11-27 ENCOUNTER — Other Ambulatory Visit: Payer: Self-pay

## 2017-11-27 ENCOUNTER — Encounter: Payer: Self-pay | Admitting: Emergency Medicine

## 2017-11-27 ENCOUNTER — Emergency Department: Payer: MEDICARE

## 2017-11-27 DIAGNOSIS — R110 Nausea: Secondary | ICD-10-CM | POA: Diagnosis not present

## 2017-11-27 DIAGNOSIS — R69 Illness, unspecified: Secondary | ICD-10-CM | POA: Diagnosis not present

## 2017-11-27 DIAGNOSIS — R1084 Generalized abdominal pain: Secondary | ICD-10-CM

## 2017-11-27 DIAGNOSIS — F17210 Nicotine dependence, cigarettes, uncomplicated: Secondary | ICD-10-CM | POA: Insufficient documentation

## 2017-11-27 DIAGNOSIS — E119 Type 2 diabetes mellitus without complications: Secondary | ICD-10-CM | POA: Diagnosis not present

## 2017-11-27 HISTORY — DX: Type 2 diabetes mellitus without complications: E11.9

## 2017-11-27 LAB — URINALYSIS, COMPLETE (UACMP) WITH MICROSCOPIC
BACTERIA UA: NONE SEEN
Bilirubin Urine: NEGATIVE
Glucose, UA: 50 mg/dL — AB
Ketones, ur: NEGATIVE mg/dL
LEUKOCYTES UA: NEGATIVE
NITRITE: NEGATIVE
PH: 5 (ref 5.0–8.0)
Protein, ur: NEGATIVE mg/dL
Specific Gravity, Urine: 1.015 (ref 1.005–1.030)

## 2017-11-27 LAB — COMPREHENSIVE METABOLIC PANEL
ALBUMIN: 3.8 g/dL (ref 3.5–5.0)
ALK PHOS: 86 U/L (ref 38–126)
ALT: 12 U/L (ref 0–44)
ANION GAP: 9 (ref 5–15)
AST: 12 U/L — AB (ref 15–41)
BILIRUBIN TOTAL: 0.4 mg/dL (ref 0.3–1.2)
BUN: 20 mg/dL (ref 8–23)
CALCIUM: 9.3 mg/dL (ref 8.9–10.3)
CO2: 23 mmol/L (ref 22–32)
CREATININE: 1.29 mg/dL — AB (ref 0.44–1.00)
Chloride: 105 mmol/L (ref 98–111)
GFR calc Af Amer: 49 mL/min — ABNORMAL LOW (ref >60)
GFR calc non Af Amer: 42 mL/min — ABNORMAL LOW (ref >60)
GLUCOSE: 212 mg/dL — AB (ref 70–99)
Potassium: 4.4 mmol/L (ref 3.5–5.1)
SODIUM: 137 mmol/L (ref 135–145)
Total Protein: 7.4 g/dL (ref 6.5–8.1)

## 2017-11-27 LAB — LIPASE, BLOOD: Lipase: 21 U/L (ref 11–51)

## 2017-11-27 LAB — CBC
HEMATOCRIT: 43 % (ref 35.0–47.0)
HEMOGLOBIN: 14.7 g/dL (ref 12.0–16.0)
MCH: 29.9 pg (ref 26.0–34.0)
MCHC: 34.2 g/dL (ref 32.0–36.0)
MCV: 87.5 fL (ref 80.0–100.0)
PLATELETS: 270 10*3/uL (ref 150–440)
RBC: 4.92 MIL/uL (ref 3.80–5.20)
RDW: 13.7 % (ref 11.5–14.5)
WBC: 10.9 10*3/uL (ref 3.6–11.0)

## 2017-11-27 IMAGING — CT CT ABD-PELV W/ CM
2 of 5 series · 16 of 46 positions shown, 18 images · IV contrast (APPLIED)
Comparison: CT scan of [DATE].

CLINICAL DATA: Acute generalized abdominal pain.

EXAM:
CT ABDOMEN AND PELVIS WITH CONTRAST
TECHNIQUE: Multidetector CT imaging of the abdomen and pelvis was performed
using the standard protocol following bolus administration of
intravenous contrast.
CONTRAST:  80mL [DE] IOPAMIDOL ([DE]) INJECTION 61%

[Series 2: axial st · axial · 0.67mm/px · z∈[-1127,-707]mm · 13 of 94 slices shown, 15 images]
[im 5/94  soft-tissue]
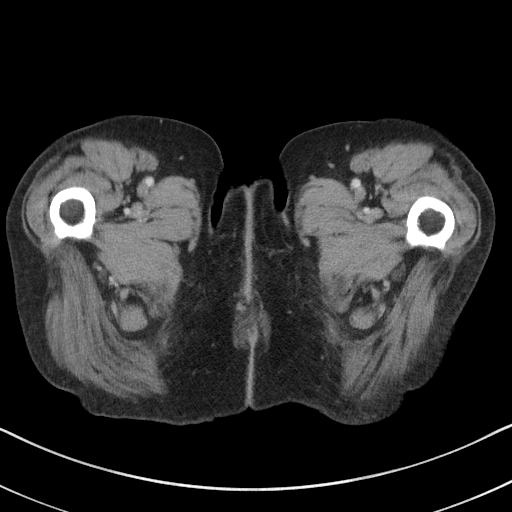
[im 5/94  bone]
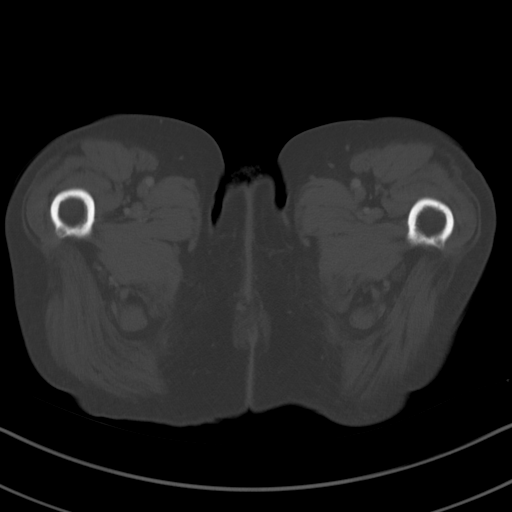
[im 14/94  soft-tissue]
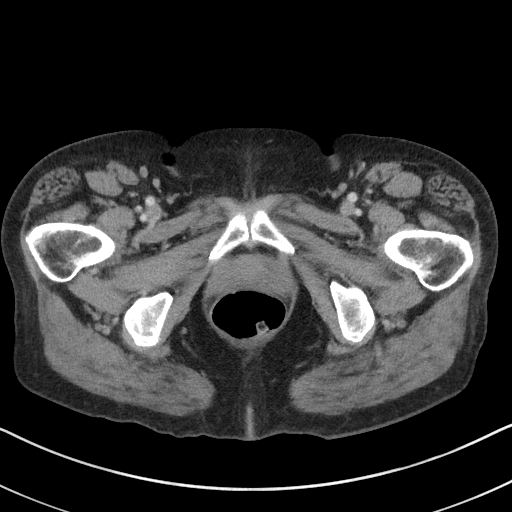
[im 19/94  soft-tissue]
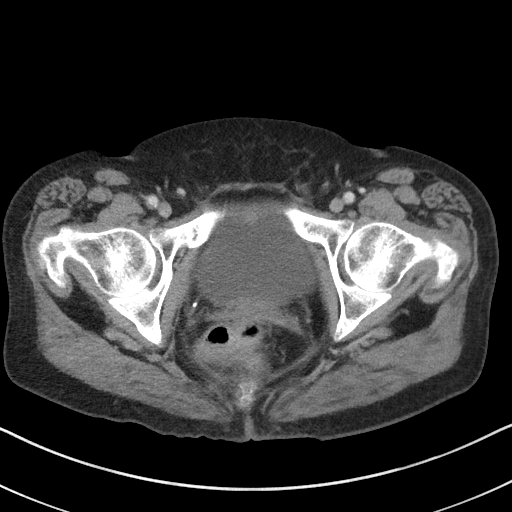
[im 28/94  soft-tissue]
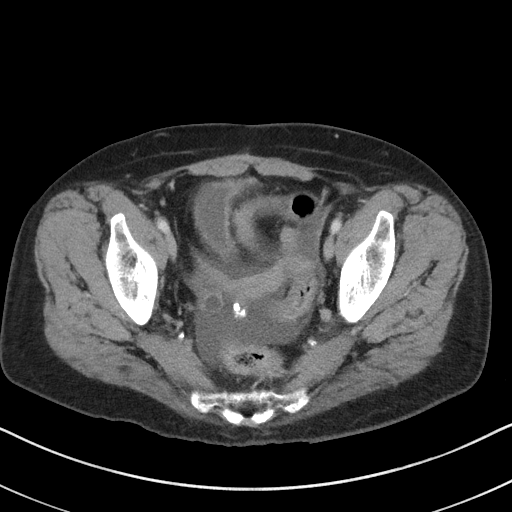
[im 33/94  soft-tissue]
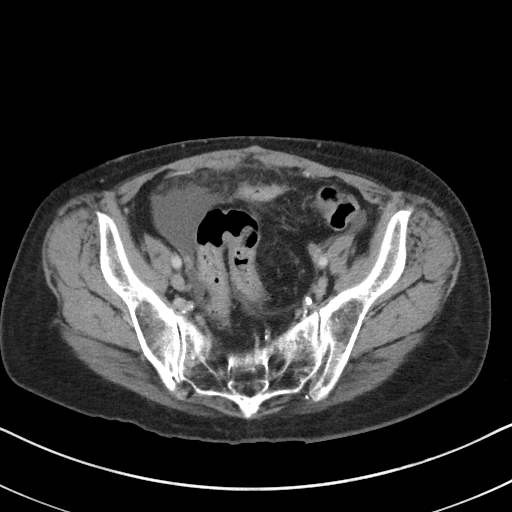
[im 42/94  soft-tissue]
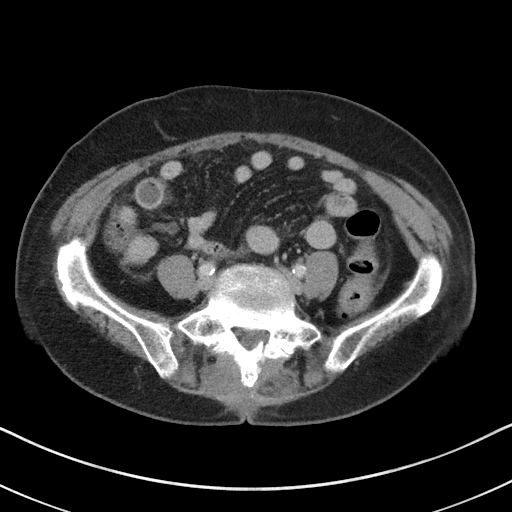
[im 47/94  soft-tissue]
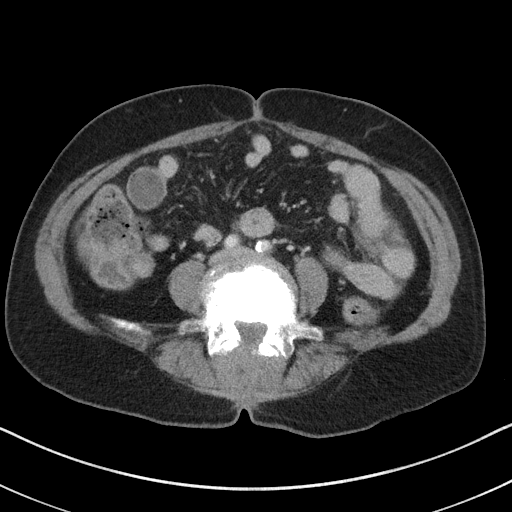
[im 52/94  soft-tissue]
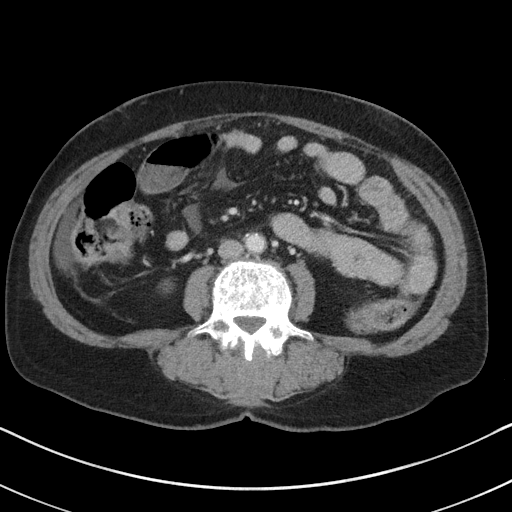
[im 61/94  soft-tissue]
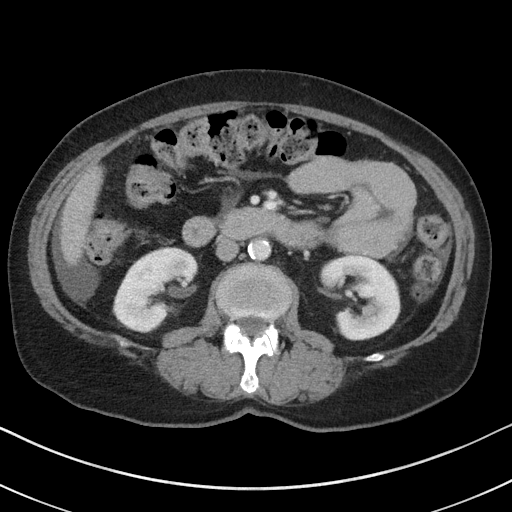
[im 61/94  bone]
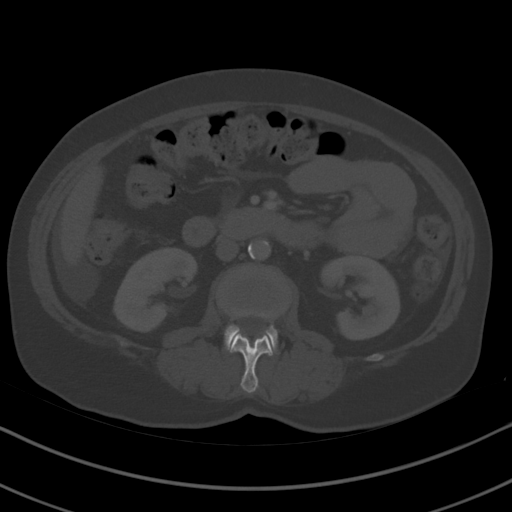
[im 66/94  soft-tissue]
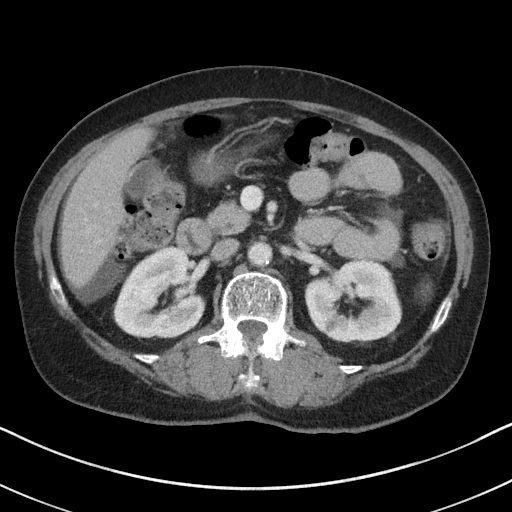
[im 75/94  soft-tissue]
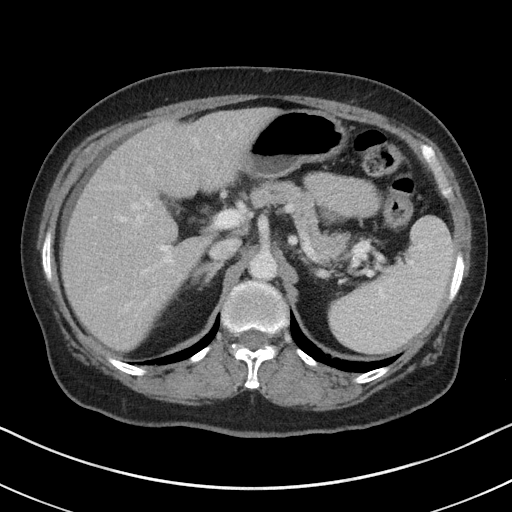
[im 80/94  soft-tissue]
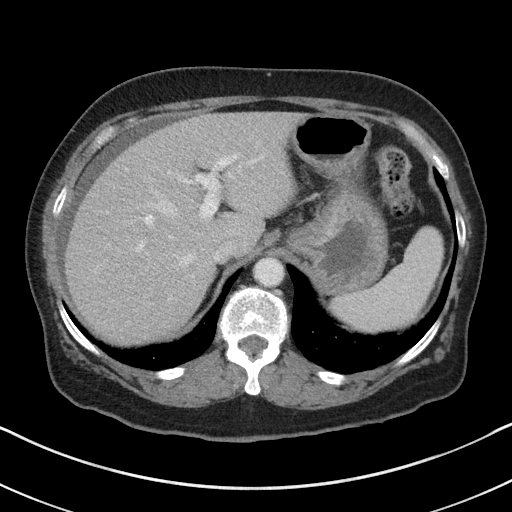
[im 89/94  soft-tissue]
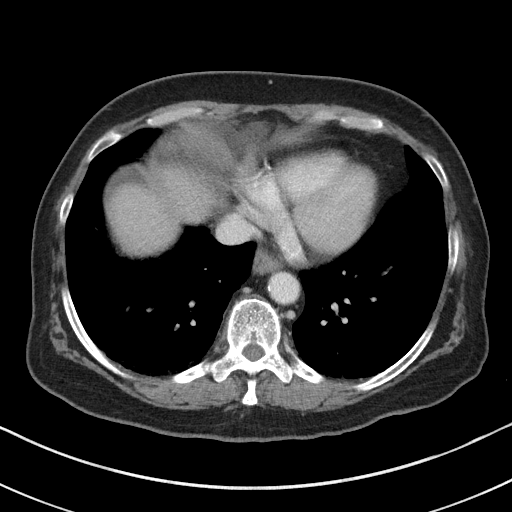

[Series 5: coronal st · coronal · 0.68mm/px · 3 of 81 slices shown]
[im 27/81  soft-tissue]
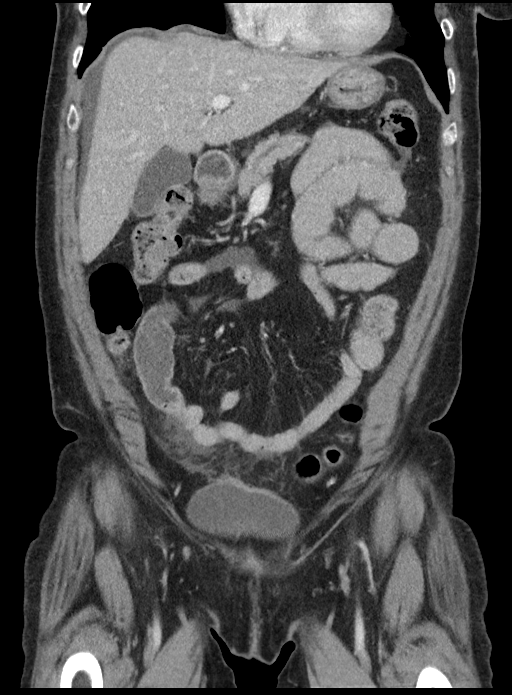
[im 36/81  soft-tissue]
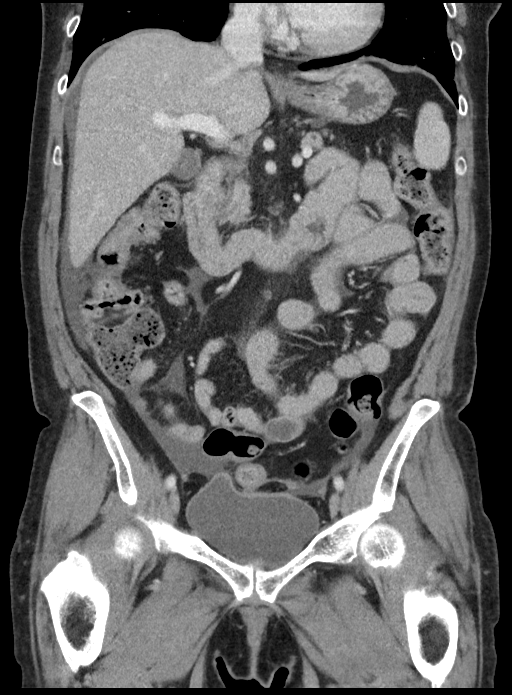
[im 45/81  soft-tissue]
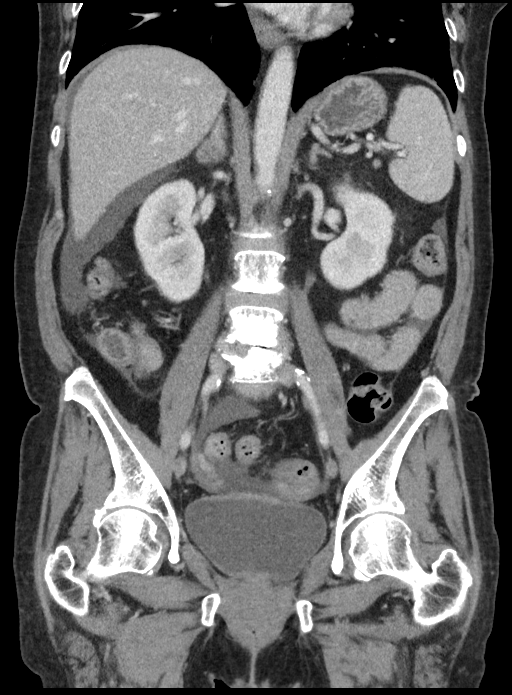

[16 of 46 positions shown; findings below may reference images not displayed]

FINDINGS: Lower chest: No acute abnormality.

Hepatobiliary: No focal liver abnormality is seen. No gallstones,
gallbladder wall thickening, or biliary dilatation. Mild amount of
fluid is noted around the liver.

Pancreas: Unremarkable. No pancreatic ductal dilatation or
surrounding inflammatory changes.

Spleen: Normal in size without focal abnormality.

Adrenals/Urinary Tract: Stable small right adrenal adenoma. Left
adrenal gland appears normal. No hydronephrosis or renal obstruction
is noted. No renal or ureteral calculi are noted. Urinary bladder is
unremarkable.

Stomach/Bowel: The stomach appears normal. The appendix is not
visualized. There is no evidence of bowel obstruction or
inflammation.

Vascular/Lymphatic: Aortic atherosclerosis. No enlarged abdominal or
pelvic lymph nodes.

Reproductive: Status post hysterectomy. No adnexal masses.

Other: Mild amount of free fluid is noted in the pelvis.

Musculoskeletal: Severe degenerative disc disease is noted at L4-5.
No acute abnormality is noted.
IMPRESSION: Mild ascites is noted.

No other acute abnormality seen in the abdomen or pelvis.

Aortic Atherosclerosis ([DE]-[DE]).

## 2017-11-27 MED ORDER — SODIUM CHLORIDE 0.9 % IV SOLN
Freq: Once | INTRAVENOUS | Status: AC
  Administered 2017-11-27: 14:00:00 via INTRAVENOUS

## 2017-11-27 MED ORDER — MORPHINE SULFATE (PF) 4 MG/ML IV SOLN
4.0000 mg | Freq: Once | INTRAVENOUS | Status: AC
  Administered 2017-11-27: 4 mg via INTRAVENOUS
  Filled 2017-11-27: qty 1

## 2017-11-27 MED ORDER — OXYCODONE-ACETAMINOPHEN 5-325 MG PO TABS
2.0000 | ORAL_TABLET | Freq: Once | ORAL | Status: AC
  Administered 2017-11-27: 2 via ORAL
  Filled 2017-11-27: qty 2

## 2017-11-27 MED ORDER — IOPAMIDOL (ISOVUE-300) INJECTION 61%
100.0000 mL | Freq: Once | INTRAVENOUS | Status: AC | PRN
  Administered 2017-11-27: 80 mL via INTRAVENOUS

## 2017-11-27 MED ORDER — ONDANSETRON 4 MG PO TBDP: 4.0000 mg | ORAL_TABLET | Freq: Three times a day (TID) | ORAL | 0 refills | Status: DC | PRN

## 2017-11-27 MED ORDER — DICYCLOMINE HCL 20 MG PO TABS: 20.0000 mg | ORAL_TABLET | Freq: Three times a day (TID) | ORAL | 0 refills | Status: DC | PRN

## 2017-11-27 MED ORDER — METRONIDAZOLE 500 MG PO TABS: 500.0000 mg | ORAL_TABLET | Freq: Three times a day (TID) | ORAL | 0 refills | Status: DC

## 2017-11-27 MED ORDER — OXYCODONE HCL 5 MG PO TABS: 5.0000 mg | ORAL_TABLET | Freq: Three times a day (TID) | ORAL | 0 refills | Status: AC | PRN

## 2017-11-27 NOTE — ED Notes (Signed)
Patient transported to CT 

## 2017-11-27 NOTE — ED Provider Notes (Signed)
[facility] Emergency Department Provider Note      Time seen: ----------------------------------------- 1:30 PM on 11/27/2017 -----------------------------------------   I have reviewed the triage vital signs and the nursing notes.  HISTORY   Chief Complaint Abdominal Pain  HPI Cheyenne [name] is a 67 y.o. female with a history of diabetes who presents to the ED for generalized abdominal pain that woke her up this morning.  Patient describes cold sweats and nausea without vomiting or diarrhea.  She denies any sick contacts, she reports similar symptoms in the past and was diagnosed with colitis.  Sometimes she took antibiotics and sometimes she did not.  She has not followed up with gastroenterology.  Past Medical History:  Diagnosis Date  . Diabetes mellitus without complication ([place])     There are no active problems to display for this patient.   Past Surgical History:  Procedure Laterality Date  . BACK SURGERY      Allergies Patient has no known allergies.  Social History Social History   Tobacco Use  . Smoking status: Current Some Day Smoker    Packs/day: 0.50  . Smokeless tobacco: Never Used  Substance Use Topics  . Alcohol use: Not Currently  . Drug use: Never   Review of Systems Constitutional: Negative for fever. Cardiovascular: Negative for chest pain. Respiratory: Negative for shortness of breath. Gastrointestinal: Positive for abdominal pain, nausea Musculoskeletal: Negative for back pain. Skin: Negative for rash. Neurological: Negative for headaches, focal weakness or numbness.  All systems negative/normal/unremarkable except as stated in the HPI  ____________________________________________   PHYSICAL EXAM:  VITAL SIGNS: ED Triage Vitals [11/27/17 1125]  Enc Vitals Group     BP (!) 139/59     Pulse Rate 78     Resp 18     Temp (!) 97.5 F (36.4 C)     Temp Source Oral     SpO2 96 %     Weight 145 lb (65.8  kg)     Height 5\' 4"  (1.626 m)     Head Circumference      Peak Flow      Pain Score 8     Pain Loc      Pain Edu?      Excl. in [place]?    Constitutional: Alert and oriented. Well appearing and in no distress. Eyes: Conjunctivae are normal. Normal extraocular movements. Cardiovascular: Normal rate, regular rhythm. No murmurs, rubs, or gallops. Respiratory: Normal respiratory effort without tachypnea nor retractions. Breath sounds are clear and equal bilaterally. No wheezes/rales/rhonchi. Gastrointestinal: Severe, nonfocal abdominal tenderness, no rebound or guarding.  Normal bowel sounds. Musculoskeletal: Nontender with normal range of motion in extremities. No lower extremity tenderness nor edema. Neurologic:  Normal speech and language. No gross focal neurologic deficits are appreciated.  Skin:  Skin is warm, dry and intact. No rash noted. Psychiatric: Mood and affect are normal. Speech and behavior are normal.  ____________________________________________  ED COURSE:  As part of my medical decision making, I reviewed the following data within the [place] History obtained from family if available, nursing notes, old chart and ekg, as well as notes from prior ED visits. Patient presented for abdominal pain, we will assess with labs and imaging as indicated at this time.   Procedures ____________________________________________   LABS (pertinent positives/negatives)  Labs Reviewed  COMPREHENSIVE METABOLIC PANEL - Abnormal; Notable for the following components:      Result Value   Glucose, Bld 212 (*)  Creatinine, Ser 1.29 (*)    AST 12 (*)    GFR calc non Af Amer 42 (*)    GFR calc Af Amer 49 (*)    All other components within normal limits  URINALYSIS, COMPLETE (UACMP) WITH MICROSCOPIC - Abnormal; Notable for the following components:   Color, Urine YELLOW (*)    APPearance CLEAR (*)    Glucose, UA 50 (*)    Hgb urine dipstick SMALL (*)    All other  components within normal limits  LIPASE, BLOOD  CBC    RADIOLOGY Images were viewed by me  CT the abdomen pelvis with contrast IMPRESSION: Mild ascites is noted.  No other acute abnormality seen in the abdomen or pelvis.  Aortic Atherosclerosis (ICD10-I70.0). ____________________________________________  DIFFERENTIAL DIAGNOSIS   Gastroenteritis, colitis, inflammatory bowel disease, chronic pain  FINAL ASSESSMENT AND PLAN  Abdominal pain   Plan: The patient had presented for generalized abdominal pain. Patient's labs do not reveal any acute process. Patient's imaging was negative.  She has had negative CT imaging this month and last month.  This likely indicates some type of chronic GI disorder, IBS, inflammatory bowel disorder or other etiology.  I have advised she must follow-up with gastroenterology without fail.  I may try Flagyl as well as antispasmodic medication and antiemetics.  She is cleared for outpatient follow-up.   Laurence Aly, MD   Note: This note was generated in part or whole with voice recognition software. Voice recognition is usually quite accurate but there are transcription errors that can and very often do occur. I apologize for any typographical errors that were not detected and corrected.     Earleen Newport, MD 11/27/17 [phone]

## 2017-11-27 NOTE — ED Triage Notes (Signed)
Patient reports generalized abdominal pain that woke her up this morning. Also reports cold sweats and nausea. Denies vomiting or diarrhea. Denies being around anyone sick. History of similar symptoms and diagnosed with colitis.

## 2017-11-27 NOTE — Discharge Instructions (Addendum)
Cheyenne [name] [id number]   [facility] [place] [id number] Exam Date:@TODAY @                   POST EXAM METFORMIN PATIENT INSTRUCTIONS  As part of your exam today in the Radiology Department, you were given  a radiographic contrast material or x-ray dye.  Because you have had this contrast material and you are taking a metformin  drug, please observe the following instructions:  DO NOT take your metformin medication for 48 hours after your test and notify your doctor for additional information and instructions.  I understand these instructions and have had an opportunity to discuss them with the Radiology Department personnel.    ___________________________________          _____________________________ Patient                                                                                                              Date    __________________________________           ______________________________ Witness                                                                                                            Date

## 2018-03-15 DIAGNOSIS — R69 Illness, unspecified: Secondary | ICD-10-CM | POA: Diagnosis not present

## 2018-03-15 DIAGNOSIS — E1140 Type 2 diabetes mellitus with diabetic neuropathy, unspecified: Secondary | ICD-10-CM | POA: Diagnosis not present

## 2018-03-15 DIAGNOSIS — Z8249 Family history of ischemic heart disease and other diseases of the circulatory system: Secondary | ICD-10-CM | POA: Diagnosis not present

## 2018-03-15 DIAGNOSIS — I10 Essential (primary) hypertension: Secondary | ICD-10-CM | POA: Diagnosis not present

## 2018-03-15 DIAGNOSIS — E1151 Type 2 diabetes mellitus with diabetic peripheral angiopathy without gangrene: Secondary | ICD-10-CM | POA: Diagnosis not present

## 2018-03-15 DIAGNOSIS — Z008 Encounter for other general examination: Secondary | ICD-10-CM | POA: Diagnosis not present

## 2018-03-15 DIAGNOSIS — G4700 Insomnia, unspecified: Secondary | ICD-10-CM | POA: Diagnosis not present

## 2018-03-15 DIAGNOSIS — M1990 Unspecified osteoarthritis, unspecified site: Secondary | ICD-10-CM | POA: Diagnosis not present

## 2018-03-15 DIAGNOSIS — J309 Allergic rhinitis, unspecified: Secondary | ICD-10-CM | POA: Diagnosis not present

## 2018-03-15 DIAGNOSIS — G8929 Other chronic pain: Secondary | ICD-10-CM | POA: Diagnosis not present

## 2018-03-15 DIAGNOSIS — Z794 Long term (current) use of insulin: Secondary | ICD-10-CM | POA: Diagnosis not present

## 2018-04-17 DIAGNOSIS — R69 Illness, unspecified: Secondary | ICD-10-CM | POA: Diagnosis not present

## 2018-04-17 DIAGNOSIS — Z23 Encounter for immunization: Secondary | ICD-10-CM | POA: Diagnosis not present

## 2018-04-17 DIAGNOSIS — E119 Type 2 diabetes mellitus without complications: Secondary | ICD-10-CM | POA: Diagnosis not present

## 2018-04-17 DIAGNOSIS — I10 Essential (primary) hypertension: Secondary | ICD-10-CM | POA: Diagnosis not present

## 2018-06-26 DIAGNOSIS — R1084 Generalized abdominal pain: Secondary | ICD-10-CM | POA: Diagnosis not present

## 2018-06-26 DIAGNOSIS — R69 Illness, unspecified: Secondary | ICD-10-CM | POA: Diagnosis not present

## 2018-06-26 DIAGNOSIS — Z79899 Other long term (current) drug therapy: Secondary | ICD-10-CM | POA: Diagnosis not present

## 2018-06-26 DIAGNOSIS — E785 Hyperlipidemia, unspecified: Secondary | ICD-10-CM | POA: Diagnosis not present

## 2018-06-26 DIAGNOSIS — R109 Unspecified abdominal pain: Secondary | ICD-10-CM | POA: Diagnosis not present

## 2018-06-26 DIAGNOSIS — R630 Anorexia: Secondary | ICD-10-CM | POA: Diagnosis not present

## 2018-06-26 DIAGNOSIS — E119 Type 2 diabetes mellitus without complications: Secondary | ICD-10-CM | POA: Diagnosis not present

## 2018-06-26 DIAGNOSIS — R140 Abdominal distension (gaseous): Secondary | ICD-10-CM | POA: Diagnosis not present

## 2018-06-26 DIAGNOSIS — Z7982 Long term (current) use of aspirin: Secondary | ICD-10-CM | POA: Diagnosis not present

## 2018-06-26 DIAGNOSIS — I10 Essential (primary) hypertension: Secondary | ICD-10-CM | POA: Diagnosis not present

## 2018-06-26 DIAGNOSIS — Z885 Allergy status to narcotic agent status: Secondary | ICD-10-CM | POA: Diagnosis not present

## 2018-06-26 DIAGNOSIS — R110 Nausea: Secondary | ICD-10-CM | POA: Diagnosis not present

## 2018-09-19 DIAGNOSIS — I10 Essential (primary) hypertension: Secondary | ICD-10-CM | POA: Diagnosis not present

## 2018-09-19 DIAGNOSIS — Z794 Long term (current) use of insulin: Secondary | ICD-10-CM | POA: Diagnosis not present

## 2018-09-19 DIAGNOSIS — R600 Localized edema: Secondary | ICD-10-CM | POA: Diagnosis not present

## 2018-09-19 DIAGNOSIS — M7521 Bicipital tendinitis, right shoulder: Secondary | ICD-10-CM | POA: Diagnosis not present

## 2018-09-19 DIAGNOSIS — Z7289 Other problems related to lifestyle: Secondary | ICD-10-CM | POA: Diagnosis not present

## 2018-09-19 DIAGNOSIS — E119 Type 2 diabetes mellitus without complications: Secondary | ICD-10-CM | POA: Diagnosis not present

## 2018-10-23 DIAGNOSIS — I10 Essential (primary) hypertension: Secondary | ICD-10-CM | POA: Diagnosis not present

## 2018-10-23 DIAGNOSIS — Z794 Long term (current) use of insulin: Secondary | ICD-10-CM | POA: Diagnosis not present

## 2018-10-23 DIAGNOSIS — R600 Localized edema: Secondary | ICD-10-CM | POA: Diagnosis not present

## 2018-10-23 DIAGNOSIS — M25511 Pain in right shoulder: Secondary | ICD-10-CM | POA: Diagnosis not present

## 2018-10-23 DIAGNOSIS — Z8541 Personal history of malignant neoplasm of cervix uteri: Secondary | ICD-10-CM | POA: Diagnosis not present

## 2018-10-23 DIAGNOSIS — Z9221 Personal history of antineoplastic chemotherapy: Secondary | ICD-10-CM | POA: Diagnosis not present

## 2018-10-23 DIAGNOSIS — E1142 Type 2 diabetes mellitus with diabetic polyneuropathy: Secondary | ICD-10-CM | POA: Diagnosis not present

## 2018-10-23 DIAGNOSIS — E113292 Type 2 diabetes mellitus with mild nonproliferative diabetic retinopathy without macular edema, left eye: Secondary | ICD-10-CM | POA: Diagnosis not present

## 2018-10-23 DIAGNOSIS — R69 Illness, unspecified: Secondary | ICD-10-CM | POA: Diagnosis not present

## 2018-10-23 DIAGNOSIS — Z923 Personal history of irradiation: Secondary | ICD-10-CM | POA: Diagnosis not present

## 2018-10-24 DIAGNOSIS — R69 Illness, unspecified: Secondary | ICD-10-CM | POA: Diagnosis not present

## 2018-10-30 DIAGNOSIS — R69 Illness, unspecified: Secondary | ICD-10-CM | POA: Diagnosis not present

## 2018-11-19 DIAGNOSIS — I10 Essential (primary) hypertension: Secondary | ICD-10-CM | POA: Diagnosis not present

## 2018-11-19 DIAGNOSIS — G2581 Restless legs syndrome: Secondary | ICD-10-CM | POA: Diagnosis not present

## 2018-11-19 DIAGNOSIS — Z794 Long term (current) use of insulin: Secondary | ICD-10-CM | POA: Diagnosis not present

## 2018-11-19 DIAGNOSIS — E119 Type 2 diabetes mellitus without complications: Secondary | ICD-10-CM | POA: Diagnosis not present

## 2018-11-19 DIAGNOSIS — E1142 Type 2 diabetes mellitus with diabetic polyneuropathy: Secondary | ICD-10-CM | POA: Diagnosis not present

## 2018-11-19 DIAGNOSIS — R358 Other polyuria: Secondary | ICD-10-CM | POA: Diagnosis not present

## 2018-11-25 DIAGNOSIS — Z0000 Encounter for general adult medical examination without abnormal findings: Secondary | ICD-10-CM | POA: Diagnosis not present

## 2018-11-25 DIAGNOSIS — Z780 Asymptomatic menopausal state: Secondary | ICD-10-CM | POA: Diagnosis not present

## 2018-11-25 DIAGNOSIS — M810 Age-related osteoporosis without current pathological fracture: Secondary | ICD-10-CM | POA: Diagnosis not present

## 2018-11-29 DIAGNOSIS — R69 Illness, unspecified: Secondary | ICD-10-CM | POA: Diagnosis not present

## 2018-12-09 DIAGNOSIS — M25531 Pain in right wrist: Secondary | ICD-10-CM | POA: Diagnosis not present

## 2018-12-09 DIAGNOSIS — Z794 Long term (current) use of insulin: Secondary | ICD-10-CM | POA: Diagnosis not present

## 2018-12-09 DIAGNOSIS — Z87891 Personal history of nicotine dependence: Secondary | ICD-10-CM | POA: Diagnosis not present

## 2018-12-09 DIAGNOSIS — Z23 Encounter for immunization: Secondary | ICD-10-CM | POA: Diagnosis not present

## 2018-12-09 DIAGNOSIS — E119 Type 2 diabetes mellitus without complications: Secondary | ICD-10-CM | POA: Diagnosis not present

## 2018-12-12 DIAGNOSIS — R69 Illness, unspecified: Secondary | ICD-10-CM | POA: Diagnosis not present

## 2018-12-25 DIAGNOSIS — R69 Illness, unspecified: Secondary | ICD-10-CM | POA: Diagnosis not present

## 2019-01-06 DIAGNOSIS — M25531 Pain in right wrist: Secondary | ICD-10-CM | POA: Diagnosis not present

## 2019-01-06 DIAGNOSIS — Z794 Long term (current) use of insulin: Secondary | ICD-10-CM | POA: Diagnosis not present

## 2019-01-06 DIAGNOSIS — E119 Type 2 diabetes mellitus without complications: Secondary | ICD-10-CM | POA: Diagnosis not present

## 2019-01-06 DIAGNOSIS — I10 Essential (primary) hypertension: Secondary | ICD-10-CM | POA: Diagnosis not present

## 2019-01-12 DIAGNOSIS — R69 Illness, unspecified: Secondary | ICD-10-CM | POA: Diagnosis not present

## 2019-01-18 ENCOUNTER — Other Ambulatory Visit: Payer: Self-pay

## 2019-01-18 ENCOUNTER — Ambulatory Visit
Admission: EM | Admit: 2019-01-18 | Discharge: 2019-01-18 | Disposition: A | Discharge status: Home / Self Care | Payer: MEDICARE | Attending: Emergency Medicine | Admitting: Emergency Medicine

## 2019-01-18 ENCOUNTER — Encounter: Payer: Self-pay | Admitting: Emergency Medicine

## 2019-01-18 DIAGNOSIS — J029 Acute pharyngitis, unspecified: Secondary | ICD-10-CM | POA: Diagnosis not present

## 2019-01-18 DIAGNOSIS — Z20822 Contact with and (suspected) exposure to covid-19: Secondary | ICD-10-CM

## 2019-01-18 DIAGNOSIS — R05 Cough: Secondary | ICD-10-CM

## 2019-01-18 DIAGNOSIS — M791 Myalgia, unspecified site: Secondary | ICD-10-CM | POA: Diagnosis not present

## 2019-01-18 DIAGNOSIS — J22 Unspecified acute lower respiratory infection: Secondary | ICD-10-CM

## 2019-01-18 DIAGNOSIS — Z20828 Contact with and (suspected) exposure to other viral communicable diseases: Secondary | ICD-10-CM

## 2019-01-18 DIAGNOSIS — H6693 Otitis media, unspecified, bilateral: Secondary | ICD-10-CM | POA: Diagnosis not present

## 2019-01-18 LAB — RAPID STREP SCREEN (MED CTR MEBANE ONLY): Streptococcus, Group A Screen (Direct): NEGATIVE

## 2019-01-18 LAB — RAPID INFLUENZA A&B ANTIGENS
Influenza A (ARMC): NEGATIVE
Influenza B (ARMC): NEGATIVE

## 2019-01-18 MED ORDER — ALBUTEROL SULFATE HFA 108 (90 BASE) MCG/ACT IN AERS: 1.0000 | INHALATION_SPRAY | Freq: Four times a day (QID) | RESPIRATORY_TRACT | 0 refills | Status: AC | PRN

## 2019-01-18 MED ORDER — CEFDINIR 300 MG PO CAPS: 300.0000 mg | ORAL_CAPSULE | Freq: Two times a day (BID) | ORAL | 0 refills | Status: AC

## 2019-01-18 NOTE — ED Triage Notes (Signed)
Patient c/o cough, congestion, bodyaches, ear pain and sore throat for the past 3 days.  Patient denies fevers.

## 2019-01-18 NOTE — ED Provider Notes (Signed)
[facility] [place], [place]   Name: Cheyenne [name] DOB: 24-Mar-1950 MRN: [id number] CSN: [id number] PCP: Patient, No Pcp Per  Arrival date and time:  01/18/19 1429  Chief Complaint:  Sore Throat, Otalgia, and Cough   NOTE: Prior to seeing the patient today, I have reviewed the triage nursing documentation and vital signs. Clinical staff has updated patient's PMH/PSHx, current medication list, and drug allergies/intolerances to ensure comprehensive history available to assist in medical decision making.   History:   HPI: Cheyenne [name] is a 68 y.o. female who presents today with complaints of bilateral ear pain, sore throat, coughing, and bodyaches. All these symptoms started 3 days ago. Her daughter is exhibiting similar symptoms. She denies fever or close contact with anyone who is known of suspected to have COVID-19. She has tried multiple OTC medications with little to no results.    Past Medical History:  Diagnosis Date   Diabetes mellitus without complication ([place])     Past Surgical History:  Procedure Laterality Date   BACK SURGERY      History reviewed. No pertinent family history.  Social History   Tobacco Use   Smoking status: Current Some Day Smoker    Packs/day: 0.50    Types: Cigarettes   Smokeless tobacco: Never Used  Substance Use Topics   Alcohol use: Not Currently   Drug use: Never    There are no active problems to display for this patient.   Home Medications:    Current Meds  Medication Sig   amitriptyline (ELAVIL) 50 MG tablet Take 50 mg by mouth at bedtime.   amLODipine (NORVASC) 10 MG tablet Take 10 mg by mouth daily.   atorvastatin (LIPITOR) 40 MG tablet Take 40 mg by mouth daily.   cetirizine (ZYRTEC) 10 MG tablet Take 10 mg by mouth daily.   glimepiride (AMARYL) 1 MG tablet Take 1 mg by mouth daily with breakfast.   losartan (COZAAR) 50 MG tablet Take 50 mg by mouth 2 (two) times daily.   metFORMIN  (GLUCOPHAGE-XR) 500 MG 24 hr tablet Take 500 mg by mouth 2 (two) times daily with a meal.    Allergies:   Gabapentin, Pregabalin, and Codeine  Review of Systems (ROS): Review of Systems  Constitutional: Positive for appetite change, chills and fatigue. Negative for activity change and fever.  HENT: Positive for congestion, ear pain and sore throat. Negative for ear discharge, postnasal drip, sinus pain, sneezing and tinnitus.   Respiratory: Positive for cough. Negative for wheezing.   Gastrointestinal: Positive for nausea.  Musculoskeletal: Positive for myalgias.  Neurological: Positive for weakness. Negative for dizziness.     Vital Signs: Today's Vitals   01/18/19 1455 01/18/19 1456 01/18/19 1458 01/18/19 1604  BP:   94/75   Pulse:   (!) 105   Resp:   16   Temp:   99.2 F (37.3 C)   TempSrc:   Oral   SpO2:   96%   Weight:  148 lb (67.1 kg)    Height:  5\' 4"  (1.626 m)    PainSc: 3    3     Physical Exam: Physical Exam Vitals signs and nursing note reviewed.  Constitutional:      Appearance: She is well-developed.  HENT:     Head: Normocephalic.     Right Ear: Tenderness present. Tympanic membrane is injected.     Left Ear: Tenderness present. A middle ear effusion is present. Tympanic membrane is injected, erythematous  and bulging.     Mouth/Throat:     Pharynx: Posterior oropharyngeal erythema present.     Tonsils: No tonsillar exudate.  Cardiovascular:     Rate and Rhythm: Normal rate and regular rhythm.     Heart sounds: Normal heart sounds.  Pulmonary:     Effort: Tachypnea present.     Breath sounds: Decreased air movement present. No wheezing or rhonchi.  Neurological:     Mental Status: She is alert.      Urgent Care Treatments / Results:   LABS: PLEASE NOTE: all labs that were ordered this encounter are listed, however only abnormal results are displayed. Labs Reviewed  RAPID STREP SCREEN ([facility] ONLY)  RAPID INFLUENZA A&B ANTIGENS ([facility]  ONLY)  NOVEL CORONAVIRUS, NAA (HOSP ORDER, SEND-OUT TO REF LAB; TAT 18-24 HRS)  CULTURE, GROUP A STREP [facility])    EKG: -None  RADIOLOGY: No results found.  PROCEDURES: Procedures  MEDICATIONS RECEIVED THIS VISIT: Medications - No data to display  PERTINENT CLINICAL COURSE NOTES/UPDATES:   Initial Impression / Assessment and Plan / Urgent Care Course:  Pertinent labs & imaging results that were available during my care of the patient were personally reviewed by me and considered in my medical decision making (see lab/imaging section of note for values and interpretations).  Cheyenne [name] is a 68 y.o. female who presents to [facility] today with complaints of sore throat, bilateral ear pain, cough, diagnosed with bilateral acute bacterial middle ear infection and lower respiratory infection,  and treated as such with the medications below. Due to patient's DM diagnosis, steriods unable to be prescribed. NP and patient reviewed discharge instructions below during visit.   Patient is well appearing overall in clinic today. She does not appear to be in any acute distress. Presenting symptoms (see HPI) and exam as documented above.   I have reviewed the follow up and strict return precautions for any new or worsening symptoms. Patient is aware of symptoms that would be deemed urgent/emergent, and would thus require further evaluation either here or in the emergency department. At the time of discharge, she verbalized understanding and consent with the discharge plan as it was reviewed with her. All questions were fielded by provider and/or clinic staff prior to patient discharge.    Final Clinical Impressions / Urgent Care Diagnoses:   Final diagnoses:  Acute bacterial middle ear infection, bilateral  Lower respiratory infection  Encounter for laboratory testing for COVID-19 virus    New Prescriptions:  [place] Controlled Substance Registry consulted? Not Applicable  Meds  ordered this encounter  Medications   cefdinir (OMNICEF) 300 MG capsule    Sig: Take 1 capsule (300 mg total) by mouth 2 (two) times daily for 10 days.    Dispense:  20 capsule    Refill:  0   albuterol (VENTOLIN HFA) 108 (90 Base) MCG/ACT inhaler    Sig: Inhale 1-2 puffs into the lungs every 6 (six) hours as needed for wheezing or shortness of breath.    Dispense:  8 g    Refill:  0      Discharge Instructions     Follow-up with an ear, nose, throat doctor as soon as you can.   Use the albuterol inhaler every 6 hours for the first two days, then change to using it every 6 hours as needed.  ____________________________________________     Person Under Monitoring Name: Arvin Collard  Location: [place]  [id number]   Infection Prevention Recommendations for Individuals Confirmed to have, or Being Evaluated for, 2019 Novel Coronavirus (COVID-19) Infection Who Receive Care at Home  Individuals who are confirmed to have, or are being evaluated for, COVID-19 should follow the prevention steps below until a healthcare provider or local or state health department says they can return to normal activities.  Stay home except to get medical care You should restrict activities outside your home, except for getting medical care. Do not go to work, school, or public areas, and do not use public transportation or taxis.  Call ahead before visiting your doctor Before your medical appointment, call the healthcare provider and tell them that you have, or are being evaluated for, COVID-19 infection. This will help the healthcare providers office take steps to keep other people from getting infected. Ask your healthcare provider to call the local or state health department.  Monitor your symptoms Seek prompt medical attention if your illness is worsening (e.g., difficulty breathing). Before going to your medical appointment, call the healthcare provider and tell  them that you have, or are being evaluated for, COVID-19 infection. Ask your healthcare provider to call the local or state health department.  Wear a facemask You should wear a facemask that covers your nose and mouth when you are in the same room with other people and when you visit a healthcare provider. People who live with or visit you should also wear a facemask while they are in the same room with you.  Separate yourself from other people in your home As much as possible, you should stay in a different room from other people in your home. Also, you should use a separate bathroom, if available.  Avoid sharing household items You should not share dishes, drinking glasses, cups, eating utensils, towels, bedding, or other items with other people in your home. After using these items, you should wash them thoroughly with soap and water.  Cover your coughs and sneezes Cover your mouth and nose with a tissue when you cough or sneeze, or you can cough or sneeze into your sleeve. Throw used tissues in a lined trash can, and immediately wash your hands with soap and water for at least 20 seconds or use an alcohol-based hand rub.  Wash your [facility] your hands often and thoroughly with soap and water for at least 20 seconds. You can use an alcohol-based hand sanitizer if soap and water are not available and if your hands are not visibly dirty. Avoid touching your eyes, nose, and mouth with unwashed hands.   Prevention Steps for Caregivers and Household Members of Individuals Confirmed to have, or Being Evaluated for, COVID-19 Infection Being Cared for in the Home  If you live with, or provide care at home for, a person confirmed to have, or being evaluated for, COVID-19 infection please follow these guidelines to prevent infection:  Follow healthcare providers instructions Make sure that you understand and can help the patient follow any healthcare provider instructions for all  care.  Provide for the patients basic needs You should help the patient with basic needs in the home and provide support for getting groceries, prescriptions, and other personal needs.  Monitor the patients symptoms If they are getting sicker, call his or her medical provider and tell them that the patient has, or is being evaluated for, COVID-19 infection. This will help the healthcare providers office take steps to keep other people from getting infected. Ask the healthcare provider to call  the local or state health department.  Limit the number of people who have contact with the patient  If possible, have only one caregiver for the patient.  Other household members should stay in another home or place of residence. If this is not possible, they should stay  in another room, or be separated from the patient as much as possible. Use a separate bathroom, if available.  Restrict visitors who do not have an essential need to be in the home.  Keep older adults, very young children, and other sick people away from the patient Keep older adults, very young children, and those who have compromised immune systems or chronic health conditions away from the patient. This includes people with chronic heart, lung, or kidney conditions, diabetes, and cancer.  Ensure good ventilation Make sure that shared spaces in the home have good air flow, such as from an air conditioner or an opened window, weather permitting.  Wash your hands often  Wash your hands often and thoroughly with soap and water for at least 20 seconds. You can use an alcohol based hand sanitizer if soap and water are not available and if your hands are not visibly dirty.  Avoid touching your eyes, nose, and mouth with unwashed hands.  Use disposable paper towels to dry your hands. If not available, use dedicated cloth towels and replace them when they become wet.  Wear a facemask and gloves  Wear a disposable facemask at  all times in the room and gloves when you touch or have contact with the patients blood, body fluids, and/or secretions or excretions, such as sweat, saliva, sputum, nasal mucus, vomit, urine, or feces.  Ensure the mask fits over your nose and mouth tightly, and do not touch it during use.  Throw out disposable facemasks and gloves after using them. Do not reuse.  Wash your hands immediately after removing your facemask and gloves.  If your personal clothing becomes contaminated, carefully remove clothing and launder. Wash your hands after handling contaminated clothing.  Place all used disposable facemasks, gloves, and other waste in a lined container before disposing them with other household waste.  Remove gloves and wash your hands immediately after handling these items.  Do not share dishes, glasses, or other household items with the patient  Avoid sharing household items. You should not share dishes, drinking glasses, cups, eating utensils, towels, bedding, or other items with a patient who is confirmed to have, or being evaluated for, COVID-19 infection.  After the person uses these items, you should wash them thoroughly with soap and water.  Wash laundry thoroughly  Immediately remove and wash clothes or bedding that have blood, body fluids, and/or secretions or excretions, such as sweat, saliva, sputum, nasal mucus, vomit, urine, or feces, on them.  Wear gloves when handling laundry from the patient.  Read and follow directions on labels of laundry or clothing items and detergent. In general, wash and dry with the warmest temperatures recommended on the label.  Clean all areas the individual has used often  Clean all touchable surfaces, such as counters, tabletops, doorknobs, bathroom fixtures, toilets, phones, keyboards, tablets, and bedside tables, every day. Also, clean any surfaces that may have blood, body fluids, and/or secretions or excretions on them.  Wear gloves when  cleaning surfaces the patient has come in contact with.  Use a diluted bleach solution (e.g., dilute bleach with 1 part bleach and 10 parts water) or a household disinfectant with a label that says EPA-registered  for coronaviruses. To make a bleach solution at home, add 1 tablespoon of bleach to 1 quart (4 cups) of water. For a larger supply, add  cup of bleach to 1 gallon (16 cups) of water.  Read labels of cleaning products and follow recommendations provided on product labels. Labels contain instructions for safe and effective use of the cleaning product including precautions you should take when applying the product, such as wearing gloves or eye protection and making sure you have good ventilation during use of the product.  Remove gloves and wash hands immediately after cleaning.  Monitor yourself for signs and symptoms of illness Caregivers and household members are considered close contacts, should monitor their health, and will be asked to limit movement outside of the home to the extent possible. Follow the monitoring steps for close contacts listed on the symptom monitoring form.   ? If you have additional questions, contact your local health department or call the epidemiologist on call at [phone] (available 24/7). ? This guidance is subject to change. For the most up-to-date guidance from [facility], please refer to their website: YouBlogs.pl     Recommended Follow up Care:  Patient encouraged to follow up with the following provider within the specified time frame, or sooner as dictated by the severity of her symptoms. As always, she was instructed that for any urgent/emergent care needs, she should seek care either here or in the emergency department for more immediate evaluation.   Gertie Baron, DNP, NP-c    Gertie Baron, NP 01/18/19 [phone]

## 2019-01-18 NOTE — Discharge Instructions (Signed)
Follow-up with an ear, nose, throat doctor as soon as you can.   Use the albuterol inhaler every 6 hours for the first two days, then change to using it every 6 hours as needed.  ____________________________________________     Person Under Monitoring Name: Cheyenne [name]  Location: [place] [address]   Infection Prevention Recommendations for Individuals Confirmed to have, or Being Evaluated for, 2019 Novel Coronavirus (COVID-19) Infection Who Receive Care at Home  Individuals who are confirmed to have, or are being evaluated for, COVID-19 should follow the prevention steps below until a healthcare provider or local or state health department says they can return to normal activities.  Stay home except to get medical care You should restrict activities outside your home, except for getting medical care. Do not go to work, school, or public areas, and do not use public transportation or taxis.  Call ahead before visiting your doctor Before your medical appointment, call the healthcare provider and tell them that you have, or are being evaluated for, COVID-19 infection. This will help the healthcare providers office take steps to keep other people from getting infected. Ask your healthcare provider to call the local or state health department.  Monitor your symptoms Seek prompt medical attention if your illness is worsening (e.g., difficulty breathing). Before going to your medical appointment, call the healthcare provider and tell them that you have, or are being evaluated for, COVID-19 infection. Ask your healthcare provider to call the local or state health department.  Wear a facemask You should wear a facemask that covers your nose and mouth when you are in the same room with other people and when you visit a healthcare provider. People who live with or visit you should also wear a facemask while they are in the same room with you.  Separate  yourself from other people in your home As much as possible, you should stay in a different room from other people in your home. Also, you should use a separate bathroom, if available.  Avoid sharing household items You should not share dishes, drinking glasses, cups, eating utensils, towels, bedding, or other items with other people in your home. After using these items, you should wash them thoroughly with soap and water.  Cover your coughs and sneezes Cover your mouth and nose with a tissue when you cough or sneeze, or you can cough or sneeze into your sleeve. Throw used tissues in a lined trash can, and immediately wash your hands with soap and water for at least 20 seconds or use an alcohol-based hand rub.  Wash your [facility] your hands often and thoroughly with soap and water for at least 20 seconds. You can use an alcohol-based hand sanitizer if soap and water are not available and if your hands are not visibly dirty. Avoid touching your eyes, nose, and mouth with unwashed hands.   Prevention Steps for Caregivers and Household Members of Individuals Confirmed to have, or Being Evaluated for, COVID-19 Infection Being Cared for in the Home  If you live with, or provide care at home for, a person confirmed to have, or being evaluated for, COVID-19 infection please follow these guidelines to prevent infection:  Follow healthcare providers instructions Make sure that you understand and can help the patient follow any healthcare provider instructions for all care.  Provide for the patients basic needs You should help the patient with basic needs in the home and provide support for getting groceries,  prescriptions, and other personal needs.  Monitor the patients symptoms If they are getting sicker, call his or her medical provider and tell them that the patient has, or is being evaluated for, COVID-19 infection. This will help the healthcare providers office take steps to keep  other people from getting infected. Ask the healthcare provider to call the local or state health department.  Limit the number of people who have contact with the patient If possible, have only one caregiver for the patient. Other household members should stay in another home or place of residence. If this is not possible, they should stay in another room, or be separated from the patient as much as possible. Use a separate bathroom, if available. Restrict visitors who do not have an essential need to be in the home.  Keep older adults, very young children, and other sick people away from the patient Keep older adults, very young children, and those who have compromised immune systems or chronic health conditions away from the patient. This includes people with chronic heart, lung, or kidney conditions, diabetes, and cancer.  Ensure good ventilation Make sure that shared spaces in the home have good air flow, such as from an air conditioner or an opened window, weather permitting.  Wash your hands often Wash your hands often and thoroughly with soap and water for at least 20 seconds. You can use an alcohol based hand sanitizer if soap and water are not available and if your hands are not visibly dirty. Avoid touching your eyes, nose, and mouth with unwashed hands. Use disposable paper towels to dry your hands. If not available, use dedicated cloth towels and replace them when they become wet.  Wear a facemask and gloves Wear a disposable facemask at all times in the room and gloves when you touch or have contact with the patients blood, body fluids, and/or secretions or excretions, such as sweat, saliva, sputum, nasal mucus, vomit, urine, or feces.  Ensure the mask fits over your nose and mouth tightly, and do not touch it during use. Throw out disposable facemasks and gloves after using them. Do not reuse. Wash your hands immediately after removing your facemask and gloves. If your  personal clothing becomes contaminated, carefully remove clothing and launder. Wash your hands after handling contaminated clothing. Place all used disposable facemasks, gloves, and other waste in a lined container before disposing them with other household waste. Remove gloves and wash your hands immediately after handling these items.  Do not share dishes, glasses, or other household items with the patient Avoid sharing household items. You should not share dishes, drinking glasses, cups, eating utensils, towels, bedding, or other items with a patient who is confirmed to have, or being evaluated for, COVID-19 infection. After the person uses these items, you should wash them thoroughly with soap and water.  Wash laundry thoroughly Immediately remove and wash clothes or bedding that have blood, body fluids, and/or secretions or excretions, such as sweat, saliva, sputum, nasal mucus, vomit, urine, or feces, on them. Wear gloves when handling laundry from the patient. Read and follow directions on labels of laundry or clothing items and detergent. In general, wash and dry with the warmest temperatures recommended on the label.  Clean all areas the individual has used often Clean all touchable surfaces, such as counters, tabletops, doorknobs, bathroom fixtures, toilets, phones, keyboards, tablets, and bedside tables, every day. Also, clean any surfaces that may have blood, body fluids, and/or secretions or excretions on them. Wear gloves  when cleaning surfaces the patient has come in contact with. Use a diluted bleach solution (e.g., dilute bleach with 1 part bleach and 10 parts water) or a household disinfectant with a label that says EPA-registered for coronaviruses. To make a bleach solution at home, add 1 tablespoon of bleach to 1 quart (4 cups) of water. For a larger supply, add  cup of bleach to 1 gallon (16 cups) of water. Read labels of cleaning products and follow recommendations provided on  product labels. Labels contain instructions for safe and effective use of the cleaning product including precautions you should take when applying the product, such as wearing gloves or eye protection and making sure you have good ventilation during use of the product. Remove gloves and wash hands immediately after cleaning.  Monitor yourself for signs and symptoms of illness Caregivers and household members are considered close contacts, should monitor their health, and will be asked to limit movement outside of the home to the extent possible. Follow the monitoring steps for close contacts listed on the symptom monitoring form.   ? If you have additional questions, contact your local health department or call the epidemiologist on call at [phone] (available 24/7). ? This guidance is subject to change. For the most up-to-date guidance from [facility], please refer to their website: [email or web address]

## 2019-01-19 LAB — NOVEL CORONAVIRUS, NAA (HOSP ORDER, SEND-OUT TO REF LAB; TAT 18-24 HRS): SARS-CoV-2, NAA: NOT DETECTED

## 2019-01-21 DIAGNOSIS — H6690 Otitis media, unspecified, unspecified ear: Secondary | ICD-10-CM | POA: Diagnosis not present

## 2019-01-21 DIAGNOSIS — E119 Type 2 diabetes mellitus without complications: Secondary | ICD-10-CM | POA: Diagnosis not present

## 2019-01-21 DIAGNOSIS — Z794 Long term (current) use of insulin: Secondary | ICD-10-CM | POA: Diagnosis not present

## 2019-01-21 DIAGNOSIS — R05 Cough: Secondary | ICD-10-CM | POA: Diagnosis not present

## 2019-01-21 LAB — CULTURE, GROUP A STREP (THRC)

## 2019-01-22 DIAGNOSIS — E119 Type 2 diabetes mellitus without complications: Secondary | ICD-10-CM | POA: Diagnosis not present

## 2019-01-22 DIAGNOSIS — E785 Hyperlipidemia, unspecified: Secondary | ICD-10-CM | POA: Diagnosis not present

## 2019-01-22 DIAGNOSIS — I10 Essential (primary) hypertension: Secondary | ICD-10-CM | POA: Diagnosis not present

## 2019-01-22 DIAGNOSIS — Z794 Long term (current) use of insulin: Secondary | ICD-10-CM | POA: Diagnosis not present

## 2019-01-22 DIAGNOSIS — R69 Illness, unspecified: Secondary | ICD-10-CM | POA: Diagnosis not present

## 2019-02-03 DIAGNOSIS — R69 Illness, unspecified: Secondary | ICD-10-CM | POA: Diagnosis not present

## 2019-02-10 DIAGNOSIS — R69 Illness, unspecified: Secondary | ICD-10-CM | POA: Diagnosis not present

## 2019-02-23 DIAGNOSIS — R69 Illness, unspecified: Secondary | ICD-10-CM | POA: Diagnosis not present

## 2019-03-04 DIAGNOSIS — Z87891 Personal history of nicotine dependence: Secondary | ICD-10-CM | POA: Diagnosis not present

## 2019-03-18 DIAGNOSIS — M109 Gout, unspecified: Secondary | ICD-10-CM | POA: Diagnosis not present

## 2019-03-18 DIAGNOSIS — G2581 Restless legs syndrome: Secondary | ICD-10-CM | POA: Diagnosis not present

## 2019-03-18 DIAGNOSIS — I10 Essential (primary) hypertension: Secondary | ICD-10-CM | POA: Diagnosis not present

## 2019-03-18 DIAGNOSIS — E1151 Type 2 diabetes mellitus with diabetic peripheral angiopathy without gangrene: Secondary | ICD-10-CM | POA: Diagnosis not present

## 2019-03-18 DIAGNOSIS — D649 Anemia, unspecified: Secondary | ICD-10-CM | POA: Diagnosis not present

## 2019-03-18 DIAGNOSIS — R69 Illness, unspecified: Secondary | ICD-10-CM | POA: Diagnosis not present

## 2019-03-18 DIAGNOSIS — Z794 Long term (current) use of insulin: Secondary | ICD-10-CM | POA: Diagnosis not present

## 2019-03-18 DIAGNOSIS — E785 Hyperlipidemia, unspecified: Secondary | ICD-10-CM | POA: Diagnosis not present

## 2019-03-27 DIAGNOSIS — R69 Illness, unspecified: Secondary | ICD-10-CM | POA: Diagnosis not present

## 2019-03-27 DIAGNOSIS — Z794 Long term (current) use of insulin: Secondary | ICD-10-CM | POA: Diagnosis not present

## 2019-03-27 DIAGNOSIS — E119 Type 2 diabetes mellitus without complications: Secondary | ICD-10-CM | POA: Diagnosis not present

## 2019-03-27 DIAGNOSIS — M542 Cervicalgia: Secondary | ICD-10-CM | POA: Diagnosis not present

## 2019-04-14 DIAGNOSIS — R69 Illness, unspecified: Secondary | ICD-10-CM | POA: Diagnosis not present

## 2019-04-17 DIAGNOSIS — E559 Vitamin D deficiency, unspecified: Secondary | ICD-10-CM | POA: Diagnosis not present

## 2019-04-17 DIAGNOSIS — Z794 Long term (current) use of insulin: Secondary | ICD-10-CM | POA: Diagnosis not present

## 2019-04-17 DIAGNOSIS — M25542 Pain in joints of left hand: Secondary | ICD-10-CM | POA: Diagnosis not present

## 2019-04-17 DIAGNOSIS — M25541 Pain in joints of right hand: Secondary | ICD-10-CM | POA: Diagnosis not present

## 2019-04-17 DIAGNOSIS — M810 Age-related osteoporosis without current pathological fracture: Secondary | ICD-10-CM | POA: Diagnosis not present

## 2019-04-17 DIAGNOSIS — E119 Type 2 diabetes mellitus without complications: Secondary | ICD-10-CM | POA: Diagnosis not present

## 2019-04-17 DIAGNOSIS — R5382 Chronic fatigue, unspecified: Secondary | ICD-10-CM | POA: Diagnosis not present

## 2019-04-18 DIAGNOSIS — R69 Illness, unspecified: Secondary | ICD-10-CM | POA: Diagnosis not present

## 2019-05-07 DIAGNOSIS — I10 Essential (primary) hypertension: Secondary | ICD-10-CM | POA: Diagnosis not present

## 2019-05-07 DIAGNOSIS — Z794 Long term (current) use of insulin: Secondary | ICD-10-CM | POA: Diagnosis not present

## 2019-05-07 DIAGNOSIS — R69 Illness, unspecified: Secondary | ICD-10-CM | POA: Diagnosis not present

## 2019-05-07 DIAGNOSIS — E119 Type 2 diabetes mellitus without complications: Secondary | ICD-10-CM | POA: Diagnosis not present

## 2019-05-11 DIAGNOSIS — R69 Illness, unspecified: Secondary | ICD-10-CM | POA: Diagnosis not present

## 2019-06-10 DIAGNOSIS — R69 Illness, unspecified: Secondary | ICD-10-CM | POA: Diagnosis not present

## 2019-06-24 DIAGNOSIS — R69 Illness, unspecified: Secondary | ICD-10-CM | POA: Diagnosis not present

## 2019-06-24 DIAGNOSIS — E119 Type 2 diabetes mellitus without complications: Secondary | ICD-10-CM | POA: Diagnosis not present

## 2019-06-24 DIAGNOSIS — H543 Unqualified visual loss, both eyes: Secondary | ICD-10-CM | POA: Diagnosis not present

## 2019-06-24 DIAGNOSIS — I10 Essential (primary) hypertension: Secondary | ICD-10-CM | POA: Diagnosis not present

## 2019-06-24 DIAGNOSIS — Z794 Long term (current) use of insulin: Secondary | ICD-10-CM | POA: Diagnosis not present

## 2019-07-09 DIAGNOSIS — M810 Age-related osteoporosis without current pathological fracture: Secondary | ICD-10-CM | POA: Diagnosis not present

## 2019-07-09 DIAGNOSIS — E1142 Type 2 diabetes mellitus with diabetic polyneuropathy: Secondary | ICD-10-CM | POA: Diagnosis not present

## 2019-07-09 DIAGNOSIS — G2581 Restless legs syndrome: Secondary | ICD-10-CM | POA: Diagnosis not present

## 2019-07-09 DIAGNOSIS — I10 Essential (primary) hypertension: Secondary | ICD-10-CM | POA: Diagnosis not present

## 2019-07-09 DIAGNOSIS — R69 Illness, unspecified: Secondary | ICD-10-CM | POA: Diagnosis not present

## 2019-07-09 DIAGNOSIS — T7840XD Allergy, unspecified, subsequent encounter: Secondary | ICD-10-CM | POA: Diagnosis not present

## 2019-07-09 DIAGNOSIS — M25541 Pain in joints of right hand: Secondary | ICD-10-CM | POA: Diagnosis not present

## 2019-07-09 DIAGNOSIS — E785 Hyperlipidemia, unspecified: Secondary | ICD-10-CM | POA: Diagnosis not present

## 2019-07-09 DIAGNOSIS — E119 Type 2 diabetes mellitus without complications: Secondary | ICD-10-CM | POA: Diagnosis not present

## 2019-07-31 DIAGNOSIS — E119 Type 2 diabetes mellitus without complications: Secondary | ICD-10-CM | POA: Diagnosis not present

## 2019-07-31 DIAGNOSIS — Z794 Long term (current) use of insulin: Secondary | ICD-10-CM | POA: Diagnosis not present

## 2019-07-31 DIAGNOSIS — I10 Essential (primary) hypertension: Secondary | ICD-10-CM | POA: Diagnosis not present

## 2019-07-31 DIAGNOSIS — R69 Illness, unspecified: Secondary | ICD-10-CM | POA: Diagnosis not present

## 2020-10-24 ENCOUNTER — Encounter (HOSPITAL_COMMUNITY): Payer: Self-pay | Admitting: Emergency Medicine

## 2020-10-24 ENCOUNTER — Inpatient Hospital Stay (HOSPITAL_COMMUNITY)
Admission: EM | Admit: 2020-10-24 | Discharge: 2020-10-31 | DRG: 065 | Disposition: A | Discharge status: Skilled Nursing Facility | Payer: Medicare HMO | Attending: Internal Medicine | Admitting: Internal Medicine

## 2020-10-24 ENCOUNTER — Emergency Department (HOSPITAL_COMMUNITY): Payer: Medicare HMO

## 2020-10-24 ENCOUNTER — Other Ambulatory Visit: Payer: Self-pay

## 2020-10-24 DIAGNOSIS — W010XXA Fall on same level from slipping, tripping and stumbling without subsequent striking against object, initial encounter: Secondary | ICD-10-CM | POA: Diagnosis present

## 2020-10-24 DIAGNOSIS — I639 Cerebral infarction, unspecified: Secondary | ICD-10-CM | POA: Diagnosis not present

## 2020-10-24 DIAGNOSIS — I10 Essential (primary) hypertension: Secondary | ICD-10-CM | POA: Diagnosis present

## 2020-10-24 DIAGNOSIS — F17210 Nicotine dependence, cigarettes, uncomplicated: Secondary | ICD-10-CM | POA: Diagnosis present

## 2020-10-24 DIAGNOSIS — I680 Cerebral amyloid angiopathy: Secondary | ICD-10-CM | POA: Diagnosis present

## 2020-10-24 DIAGNOSIS — E854 Organ-limited amyloidosis: Secondary | ICD-10-CM | POA: Diagnosis present

## 2020-10-24 DIAGNOSIS — G9349 Other encephalopathy: Secondary | ICD-10-CM | POA: Diagnosis not present

## 2020-10-24 DIAGNOSIS — Z888 Allergy status to other drugs, medicaments and biological substances status: Secondary | ICD-10-CM

## 2020-10-24 DIAGNOSIS — R531 Weakness: Secondary | ICD-10-CM

## 2020-10-24 DIAGNOSIS — Z885 Allergy status to narcotic agent status: Secondary | ICD-10-CM

## 2020-10-24 DIAGNOSIS — M545 Low back pain, unspecified: Secondary | ICD-10-CM | POA: Diagnosis present

## 2020-10-24 DIAGNOSIS — E1165 Type 2 diabetes mellitus with hyperglycemia: Secondary | ICD-10-CM | POA: Diagnosis present

## 2020-10-24 DIAGNOSIS — Z7984 Long term (current) use of oral hypoglycemic drugs: Secondary | ICD-10-CM

## 2020-10-24 DIAGNOSIS — I69354 Hemiplegia and hemiparesis following cerebral infarction affecting left non-dominant side: Secondary | ICD-10-CM

## 2020-10-24 DIAGNOSIS — Z20822 Contact with and (suspected) exposure to covid-19: Secondary | ICD-10-CM | POA: Diagnosis present

## 2020-10-24 DIAGNOSIS — Z28310 Unvaccinated for covid-19: Secondary | ICD-10-CM

## 2020-10-24 DIAGNOSIS — R29705 NIHSS score 5: Secondary | ICD-10-CM | POA: Diagnosis present

## 2020-10-24 DIAGNOSIS — Z79899 Other long term (current) drug therapy: Secondary | ICD-10-CM

## 2020-10-24 DIAGNOSIS — J329 Chronic sinusitis, unspecified: Secondary | ICD-10-CM | POA: Diagnosis present

## 2020-10-24 DIAGNOSIS — E1169 Type 2 diabetes mellitus with other specified complication: Secondary | ICD-10-CM | POA: Diagnosis present

## 2020-10-24 DIAGNOSIS — F99 Mental disorder, not otherwise specified: Secondary | ICD-10-CM

## 2020-10-24 DIAGNOSIS — E785 Hyperlipidemia, unspecified: Secondary | ICD-10-CM | POA: Diagnosis present

## 2020-10-24 DIAGNOSIS — R296 Repeated falls: Secondary | ICD-10-CM | POA: Diagnosis present

## 2020-10-24 DIAGNOSIS — E11649 Type 2 diabetes mellitus with hypoglycemia without coma: Secondary | ICD-10-CM | POA: Diagnosis present

## 2020-10-24 HISTORY — DX: Cerebral infarction, unspecified: I63.9

## 2020-10-24 LAB — COMPREHENSIVE METABOLIC PANEL
ALT: 13 U/L (ref 0–44)
AST: 16 U/L (ref 15–41)
Albumin: 4 g/dL (ref 3.5–5.0)
Alkaline Phosphatase: 86 U/L (ref 38–126)
Anion gap: 7 (ref 5–15)
BUN: 15 mg/dL (ref 8–23)
CO2: 25 mmol/L (ref 22–32)
Calcium: 9.5 mg/dL (ref 8.9–10.3)
Chloride: 105 mmol/L (ref 98–111)
Creatinine, Ser: 0.9 mg/dL (ref 0.44–1.00)
GFR, Estimated: >60 mL/min (ref >60)
Glucose, Bld: 93 mg/dL (ref 70–99)
Potassium: 4.2 mmol/L (ref 3.5–5.1)
Sodium: 137 mmol/L (ref 135–145)
Total Bilirubin: 0.7 mg/dL (ref 0.3–1.2)
Total Protein: 8.1 g/dL (ref 6.5–8.1)

## 2020-10-24 LAB — URINALYSIS, ROUTINE W REFLEX MICROSCOPIC
Bilirubin Urine: NEGATIVE
Glucose, UA: 50 mg/dL — AB
Hgb urine dipstick: NEGATIVE
Ketones, ur: NEGATIVE mg/dL
Leukocytes,Ua: NEGATIVE
Nitrite: NEGATIVE
Protein, ur: NEGATIVE mg/dL
Specific Gravity, Urine: 1.017 (ref 1.005–1.030)
pH: 5 (ref 5.0–8.0)

## 2020-10-24 LAB — CBC
HCT: 50.5 % — ABNORMAL HIGH (ref 36.0–46.0)
Hemoglobin: 16.4 g/dL — ABNORMAL HIGH (ref 12.0–15.0)
MCH: 28.9 pg (ref 26.0–34.0)
MCHC: 32.5 g/dL (ref 30.0–36.0)
MCV: 88.9 fL (ref 80.0–100.0)
Platelets: 276 10*3/uL (ref 150–400)
RBC: 5.68 MIL/uL — ABNORMAL HIGH (ref 3.87–5.11)
RDW: 14.1 % (ref 11.5–15.5)
WBC: 9.4 10*3/uL (ref 4.0–10.5)
nRBC: 0 % (ref 0.0–0.2)

## 2020-10-24 LAB — CBG MONITORING, ED: Glucose-Capillary: 98 mg/dL (ref 70–99)

## 2020-10-24 IMAGING — DX DG LUMBAR SPINE COMPLETE 4+V
5 series · 5 of 5 positions shown · non-contrast
Comparison: None.

CLINICAL DATA: Fall, left lower rib pain, low back pain

EXAM:
LUMBAR SPINE - COMPLETE 4+ VIEW

[l-spine ap]
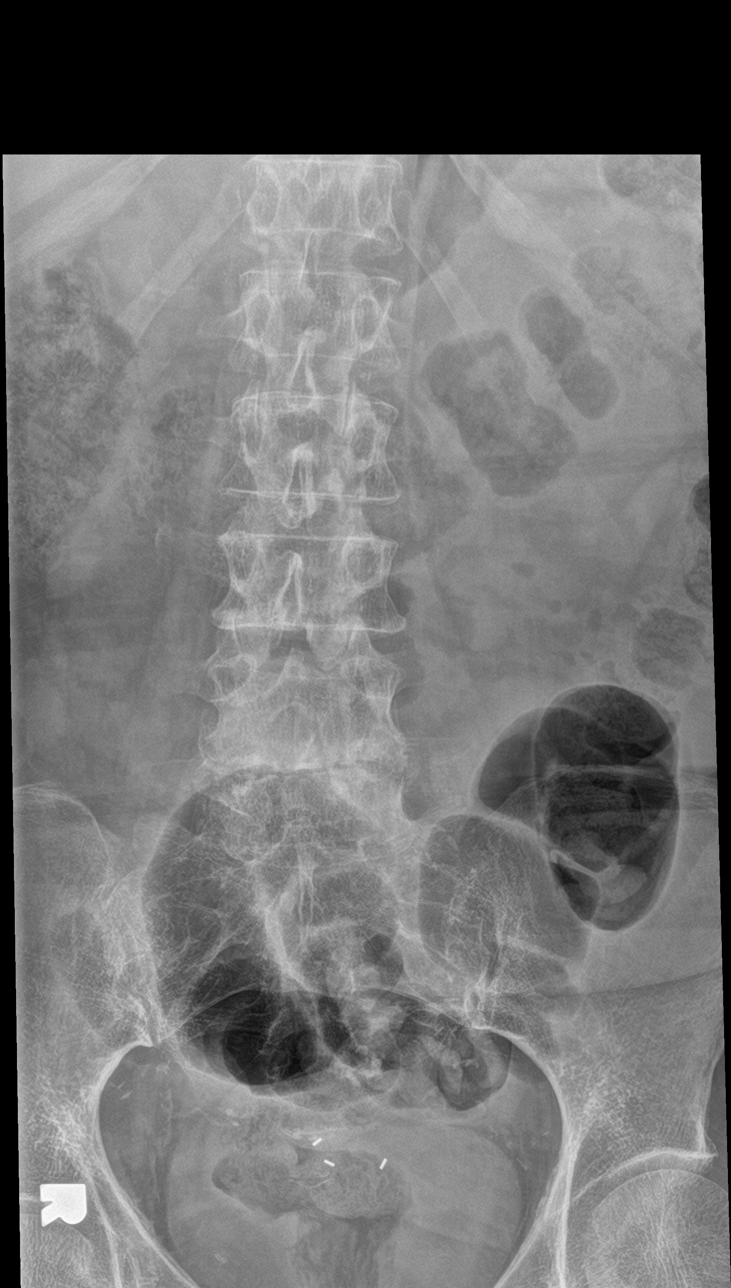

[l-spine obl (1 of 2)]
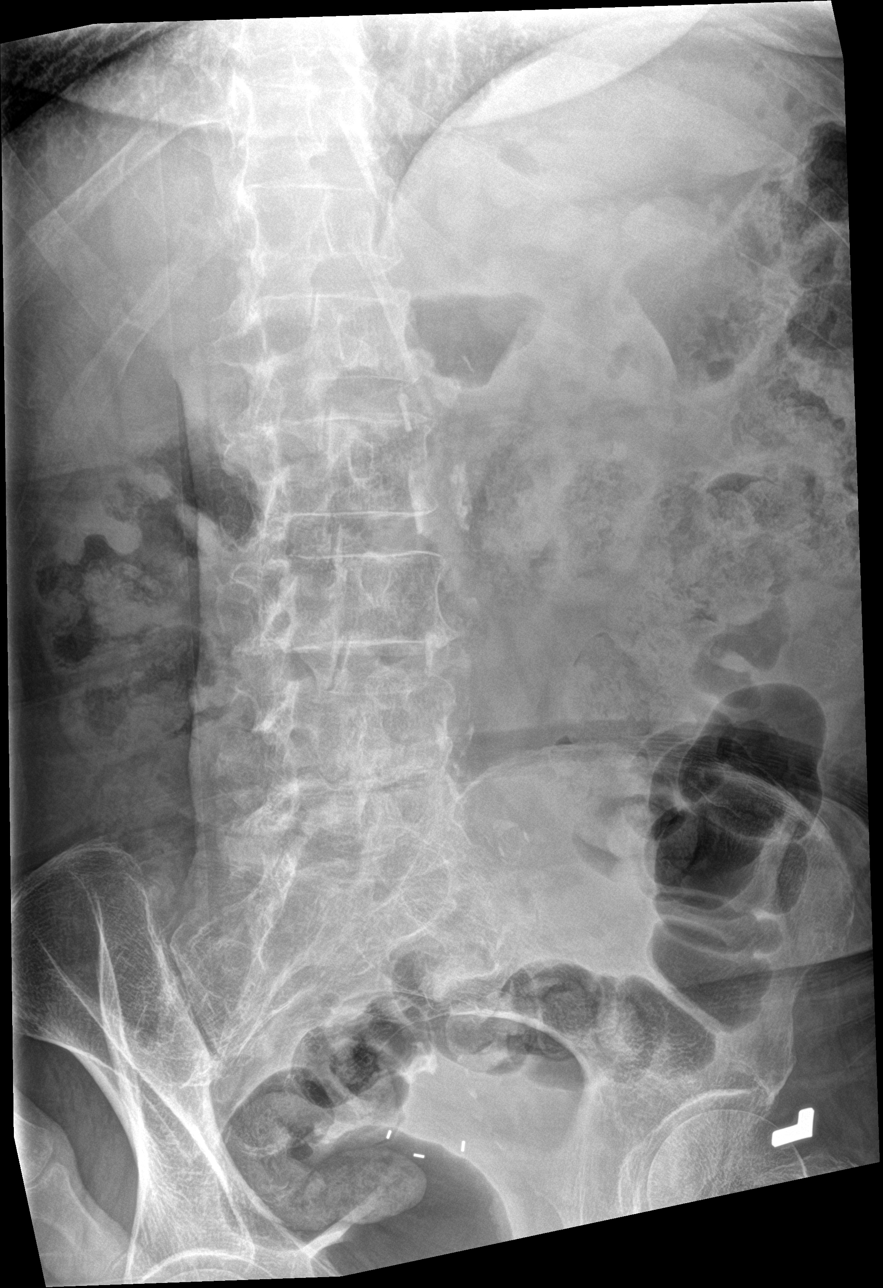

[l-spine obl (2 of 2)]
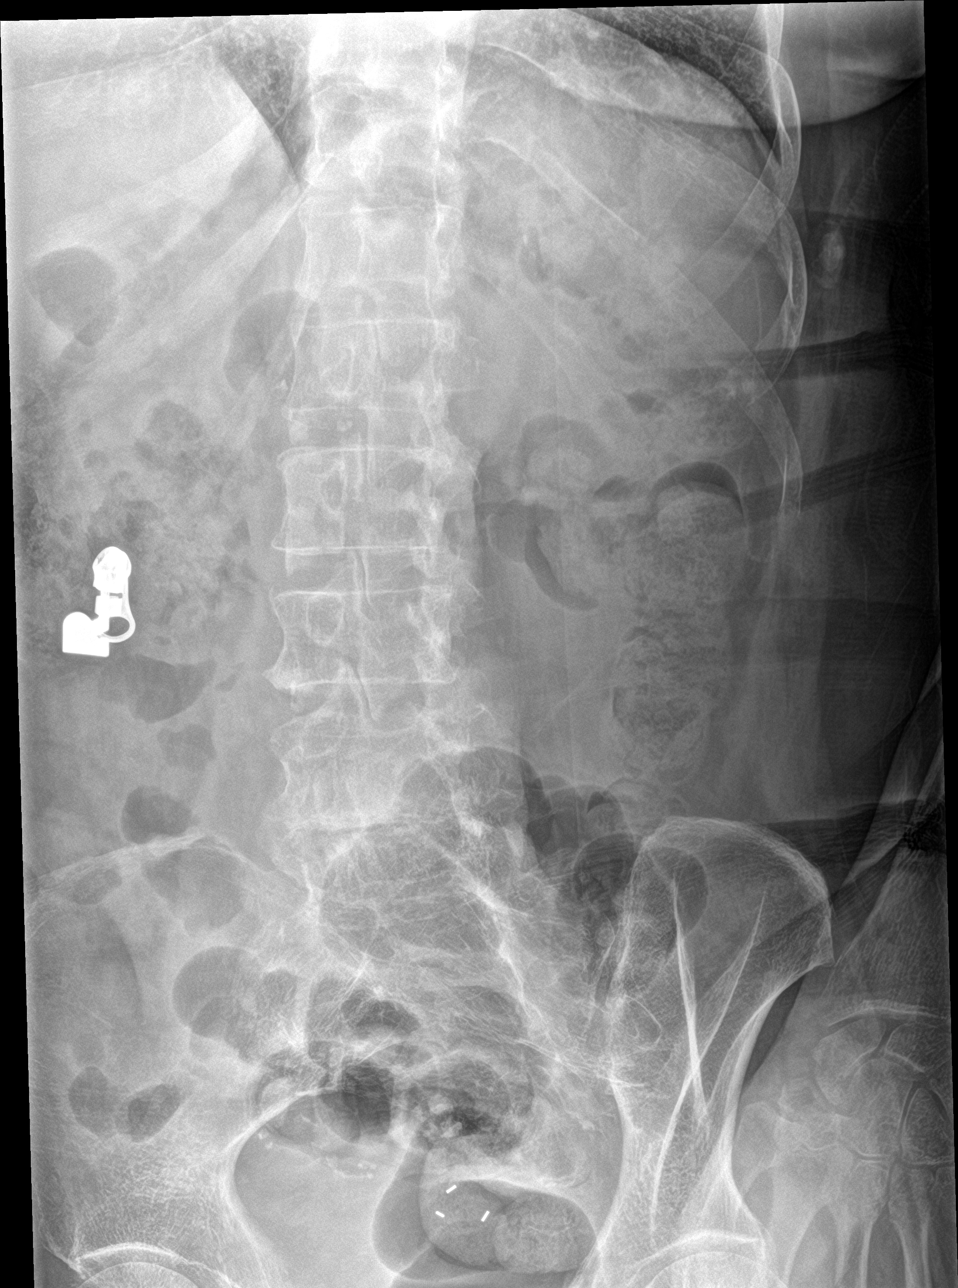

[l-spine lat]
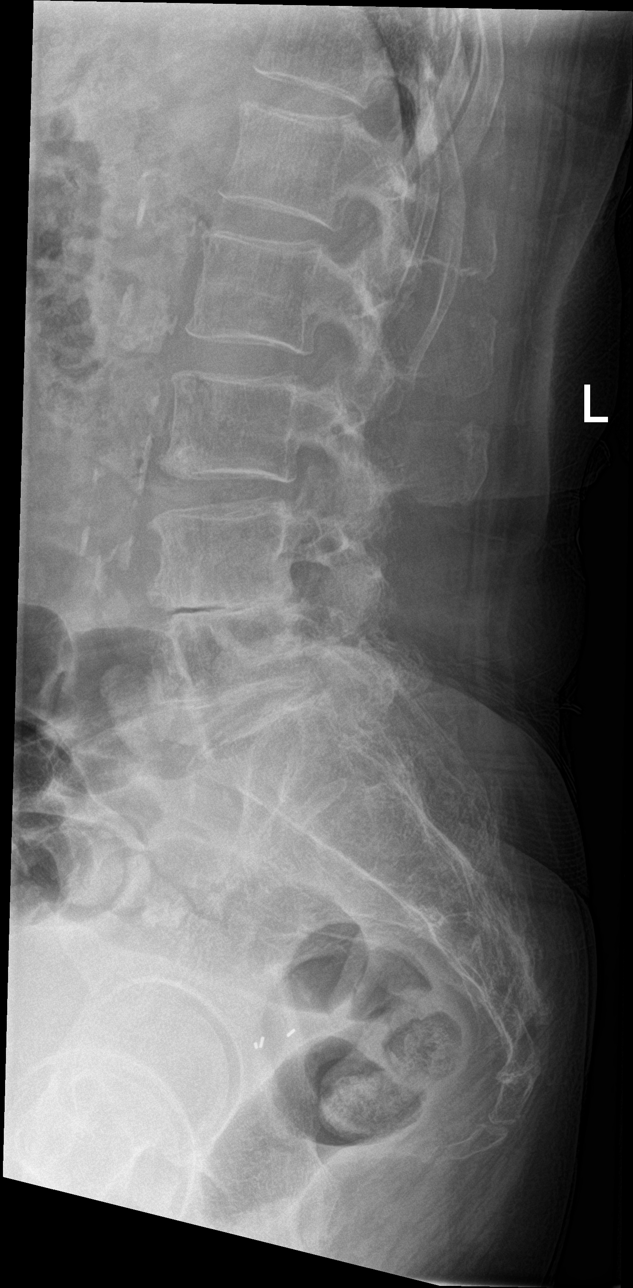

[l-spine spot]
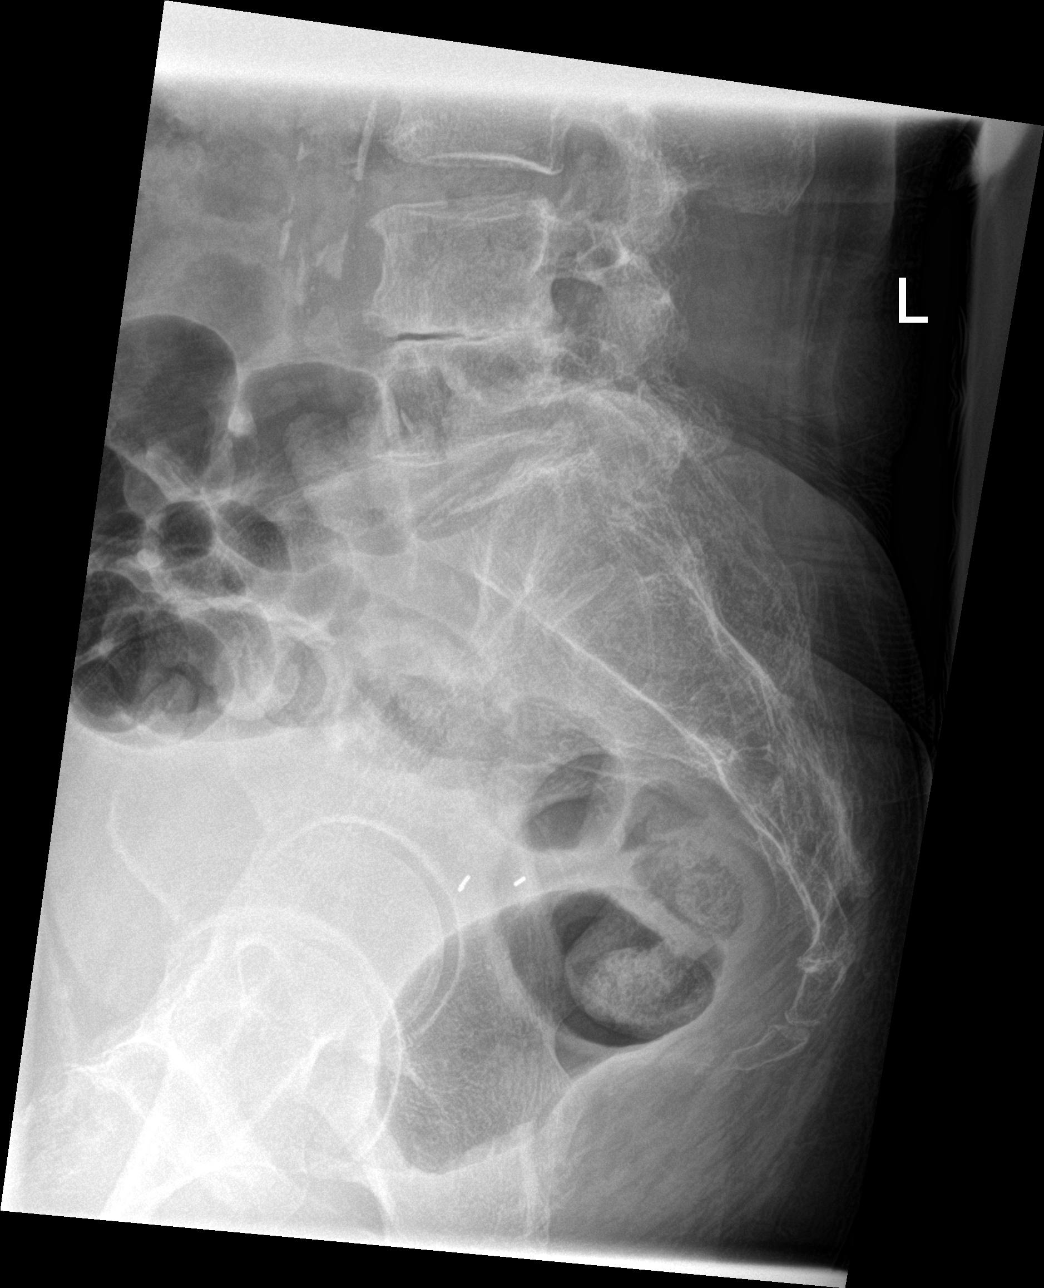

[5 of 5 positions shown; findings below may reference images not displayed]

FINDINGS: Advanced degenerative disc disease changes at L4-5 with disc space
loss, vacuum disc and spurring. Degenerative facet disease in the
lumbar spine. Normal alignment. No fracture. Diffuse osteopenia.
IMPRESSION: No acute bony abnormality.

## 2020-10-24 IMAGING — CT CT HEAD W/O CM
3 series · 15 of 47 positions shown, 18 images · non-contrast
Comparison: None.

CLINICAL DATA: Head trauma, minor (Age >= 65y) recent stroke
affecting L arm/leg at [HOSPITAL], now falling

EXAM:
CT HEAD WITHOUT CONTRAST
TECHNIQUE: Contiguous axial images were obtained from the base of the skull
through the vertex without intravenous contrast.

[Series 2: head w o · axial · 0.43mm/px · z∈[+38,+183]mm · 9 of 35 slices shown, 12 images]
[im 3/35  brain]
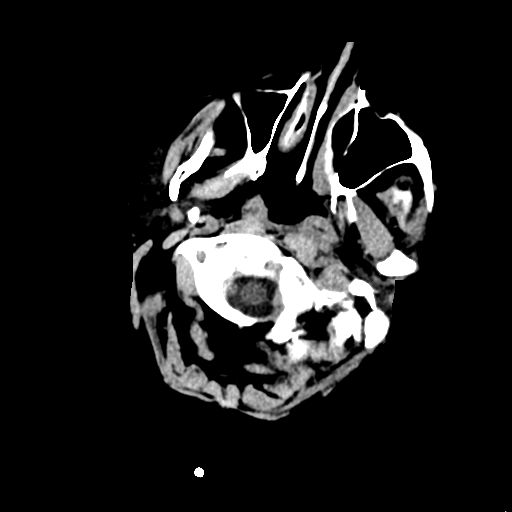
[im 3/35  bone]
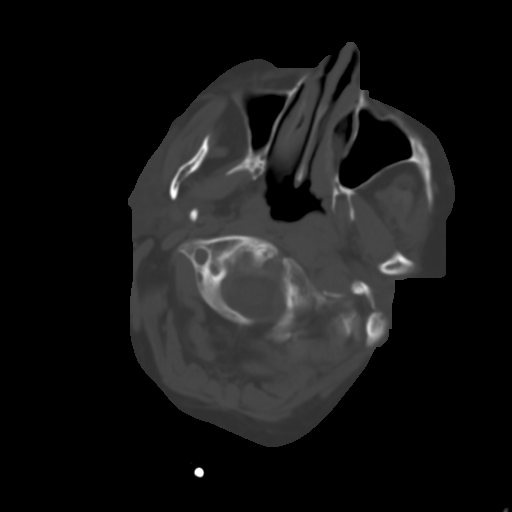
[im 6/35  brain]
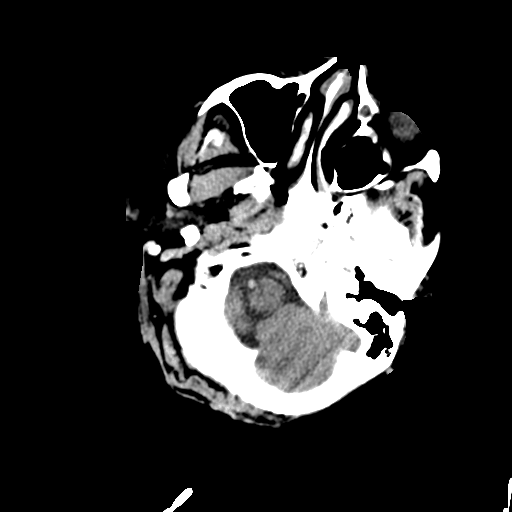
[im 10/35  brain]
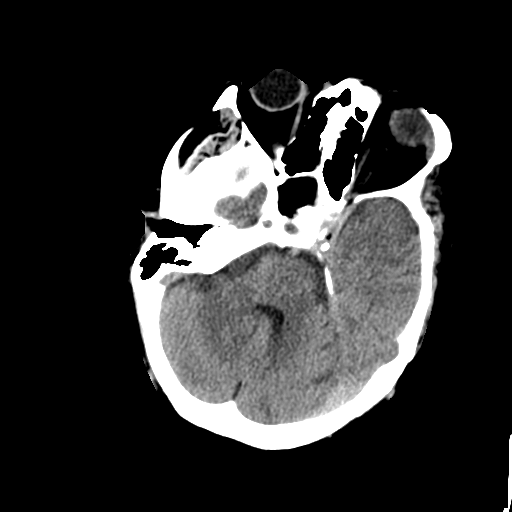
[im 13/35  brain]
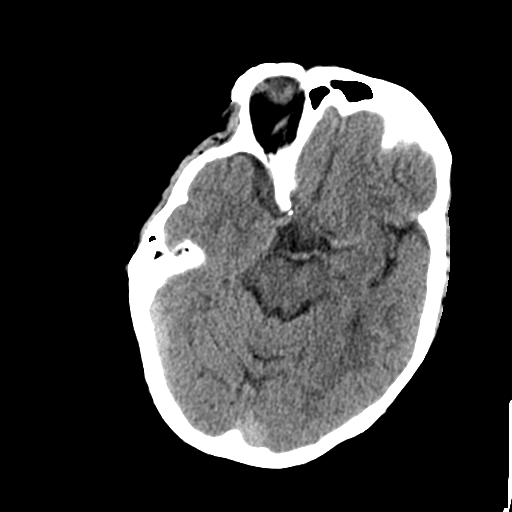
[im 18/35  brain]
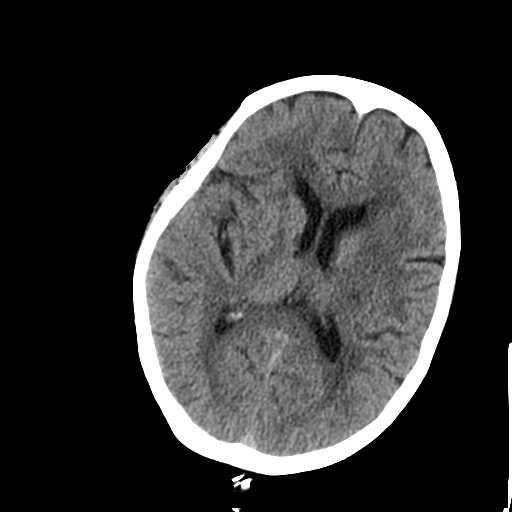
[im 18/35  bone]
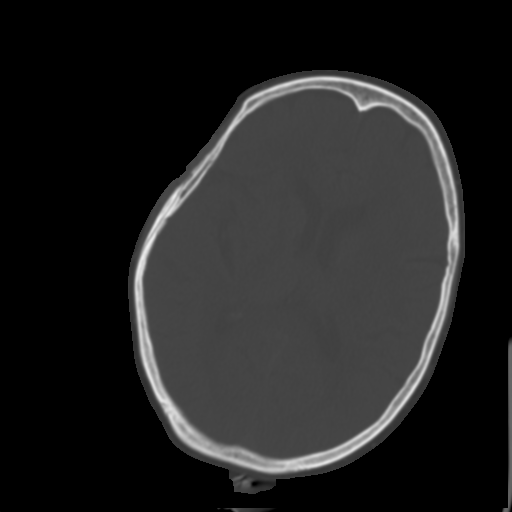
[im 22/35  brain]
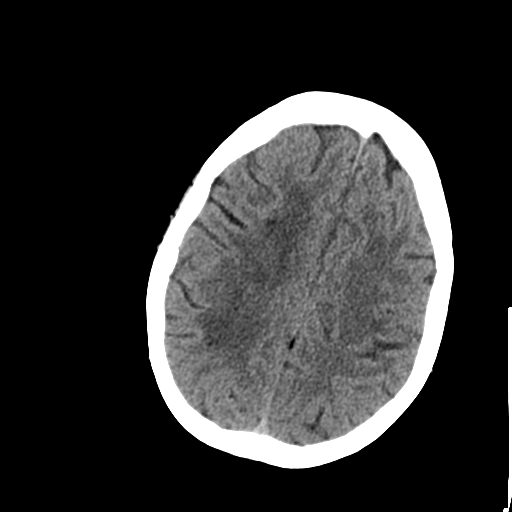
[im 25/35  brain]
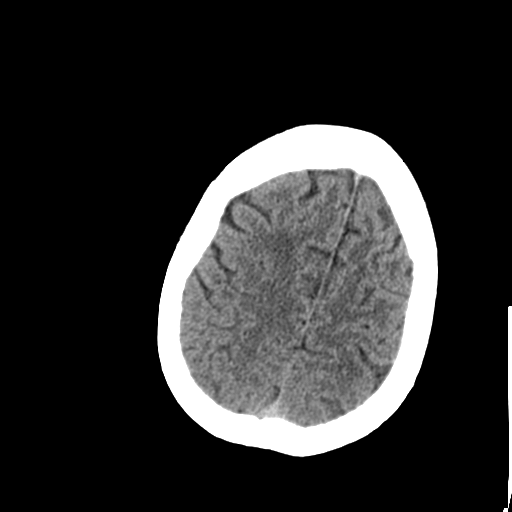
[im 29/35  brain]
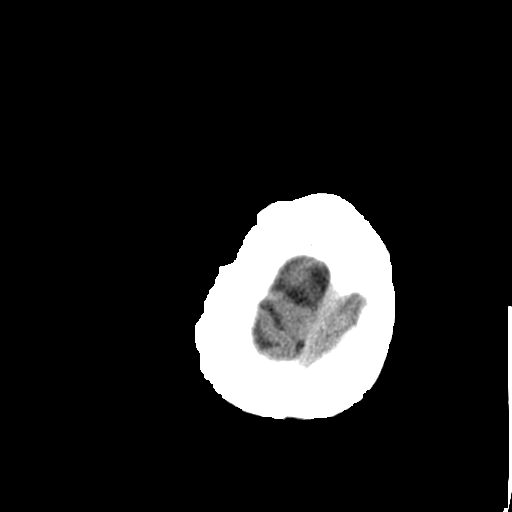
[im 32/35  brain]
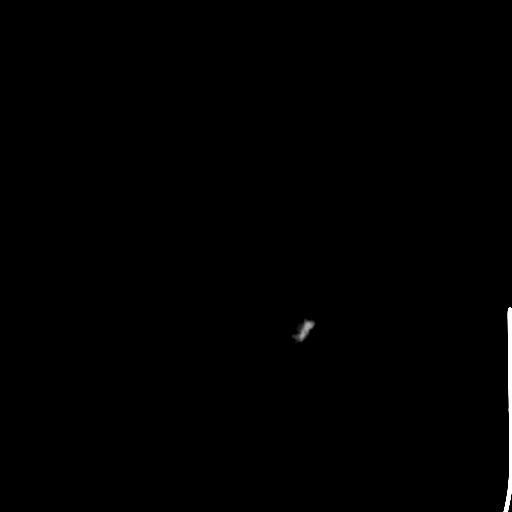
[im 32/35  bone]
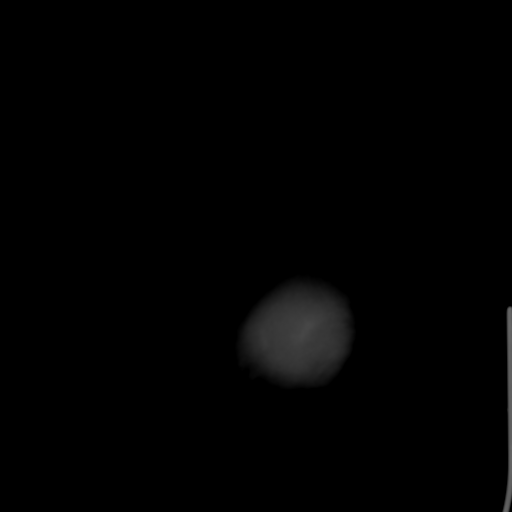

[Series 4: coronal soft · coronal · 0.32mm/px · 3 of 62 slices shown]
[im 21/62  brain]
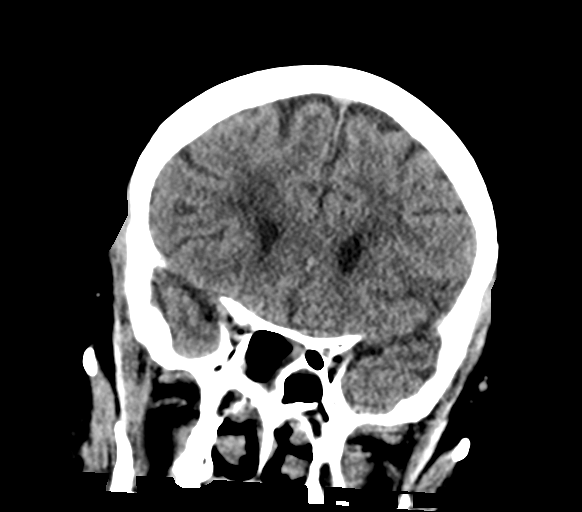
[im 28/62  brain]
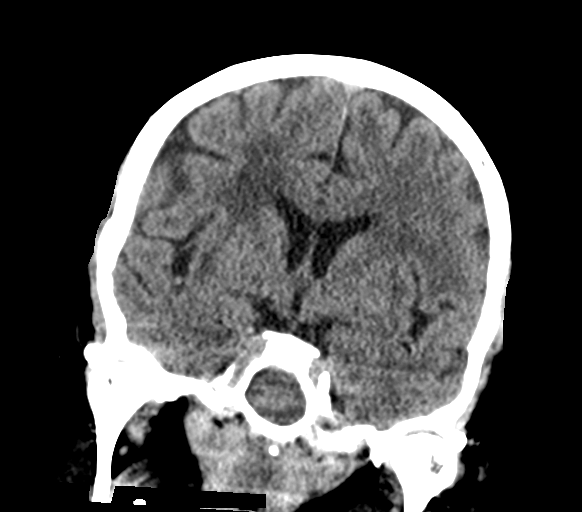
[im 34/62  brain]
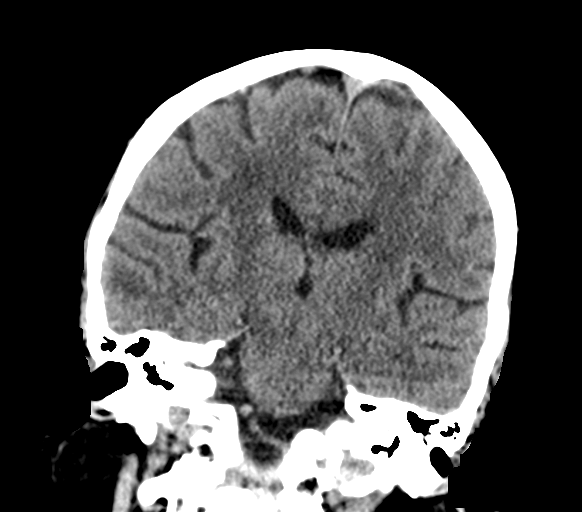

[Series 5: sagittal soft · sagittal · 0.31mm/px · 3 of 50 slices shown]
[im 22/50  brain]
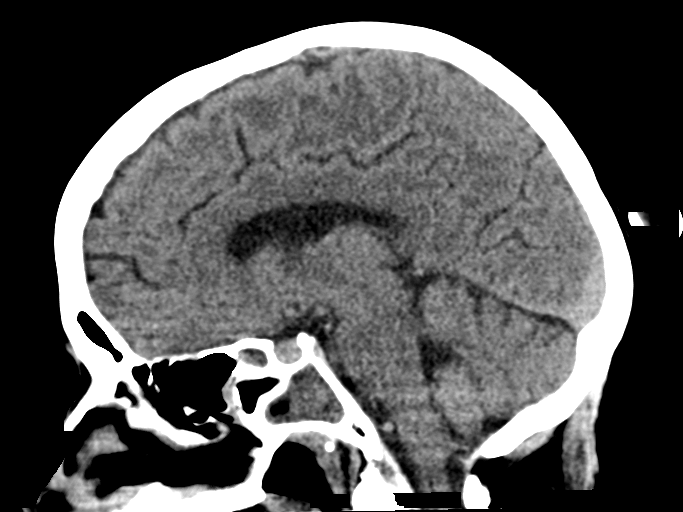
[im 25/50  brain]
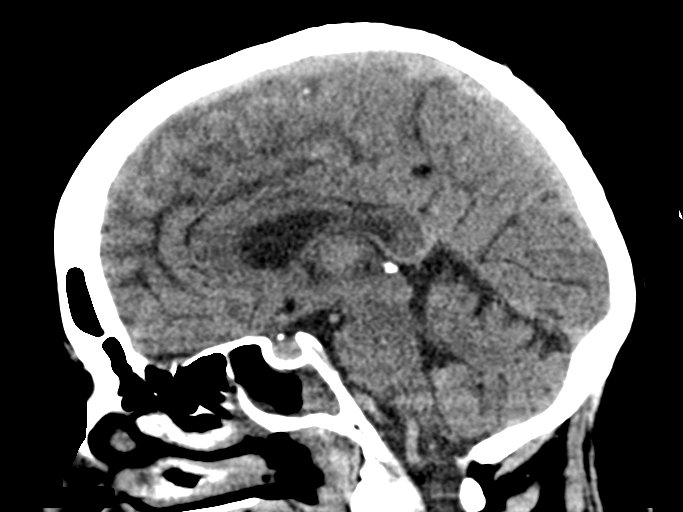
[im 29/50  brain]
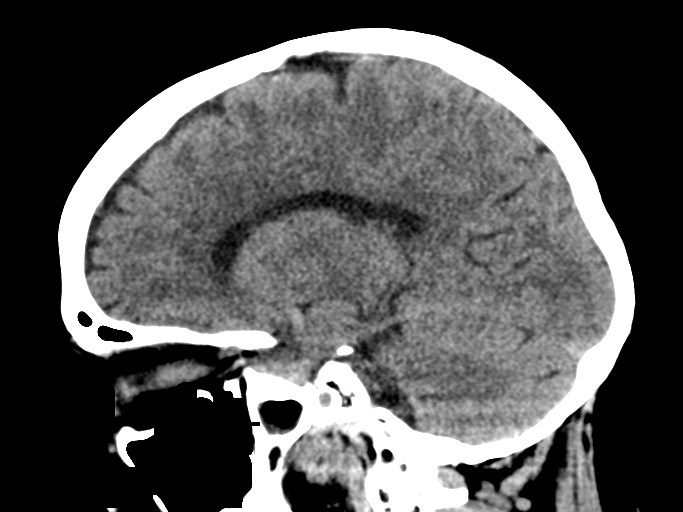

[15 of 47 positions shown; findings below may reference images not displayed]

FINDINGS: Brain: Patchy low-density throughout the deep white matter, likely
chronic small vessel disease. No acute intracranial abnormality.
Specifically, no hemorrhage, hydrocephalus, mass lesion, acute
infarction, or significant intracranial injury.

Vascular: No hyperdense vessel or unexpected calcification.

Skull: No acute calvarial abnormality.

Sinuses/Orbits: Mucosal thickening throughout the paranasal sinuses,
most pronounced in the sphenoid sinuses.

Other: None
IMPRESSION: Chronic small vessel disease.

No acute intracranial abnormality.

Chronic sinusitis.

## 2020-10-24 IMAGING — DX DG RIBS W/ CHEST 3+V*L*
4 series · 6 of 6 positions shown · non-contrast
Comparison: Chest radiograph dated [DATE].

CLINICAL DATA: Fall and left rib pain.

EXAM:
LEFT RIBS AND CHEST - 3+ VIEW

[rib pa obl (1 of 2)]
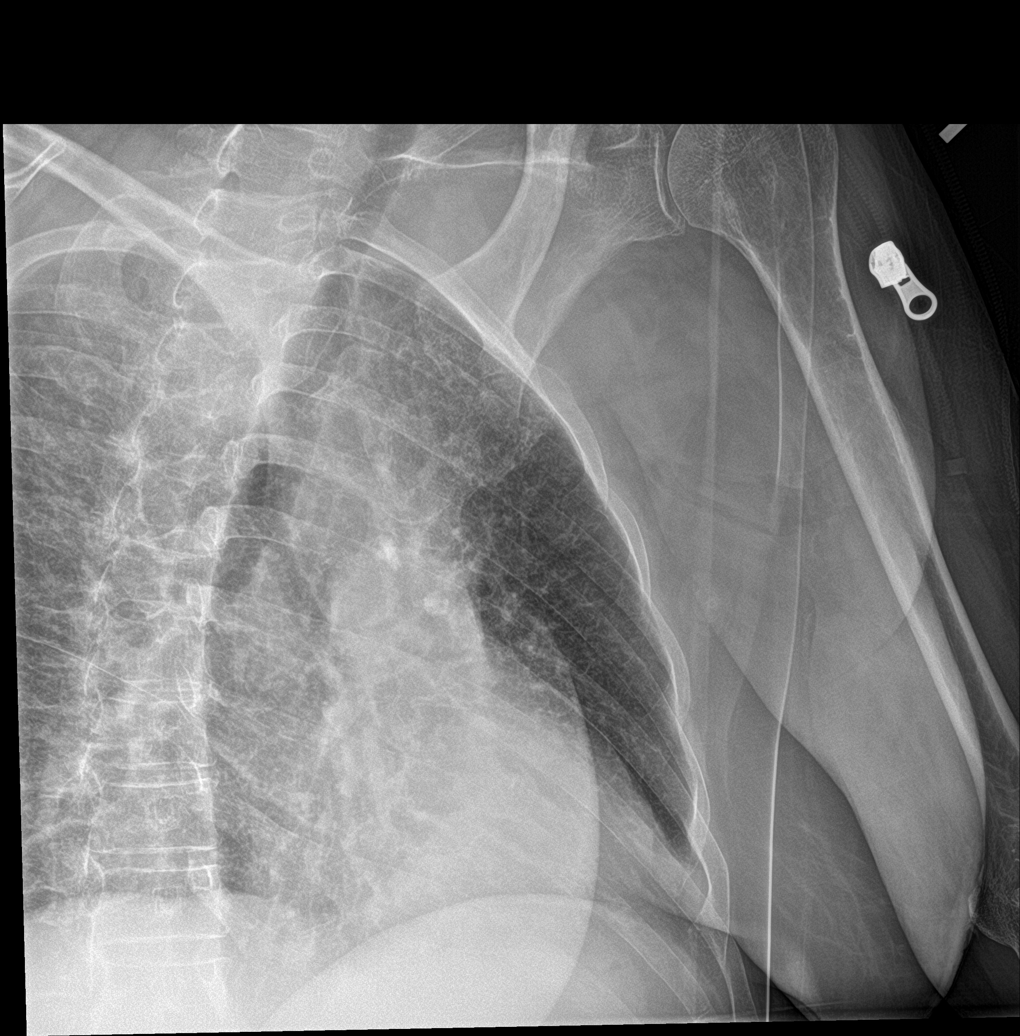

[rib pa obl (2 of 2)]
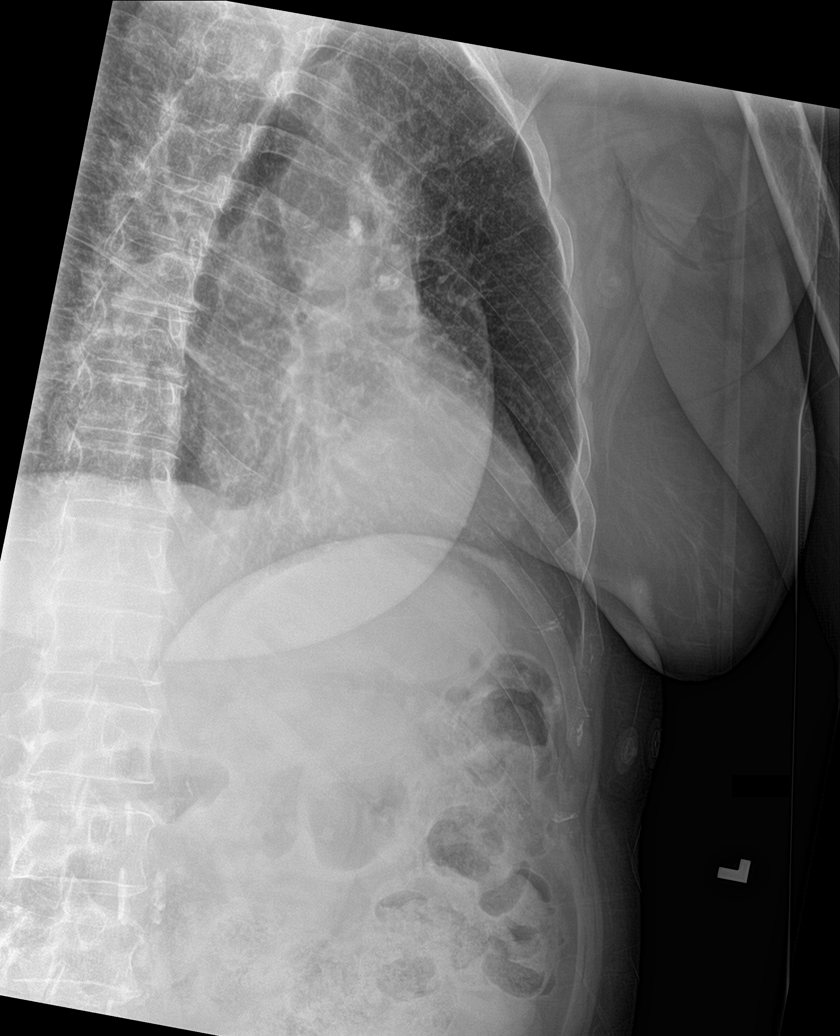

[Series 4: rib pa · 0.13mm/px · 2 of 2 slices shown]
[im 1/2]
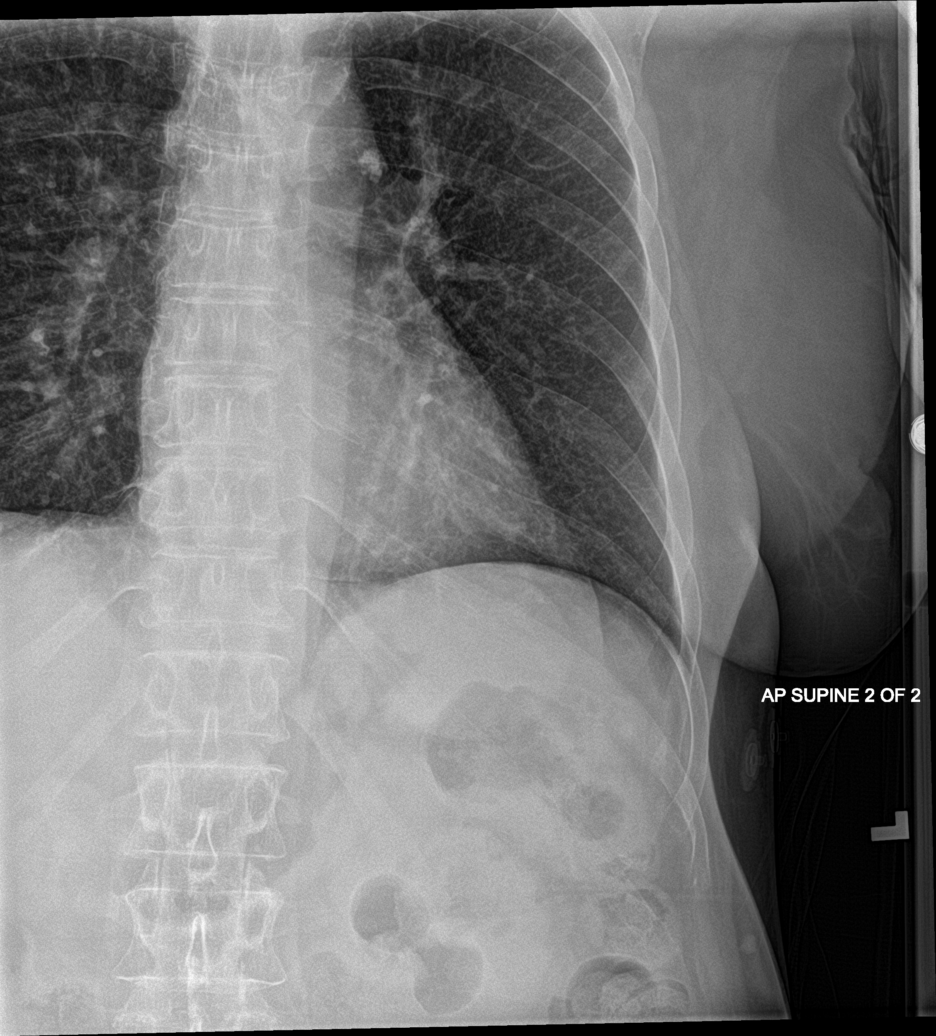
[im 2/2]
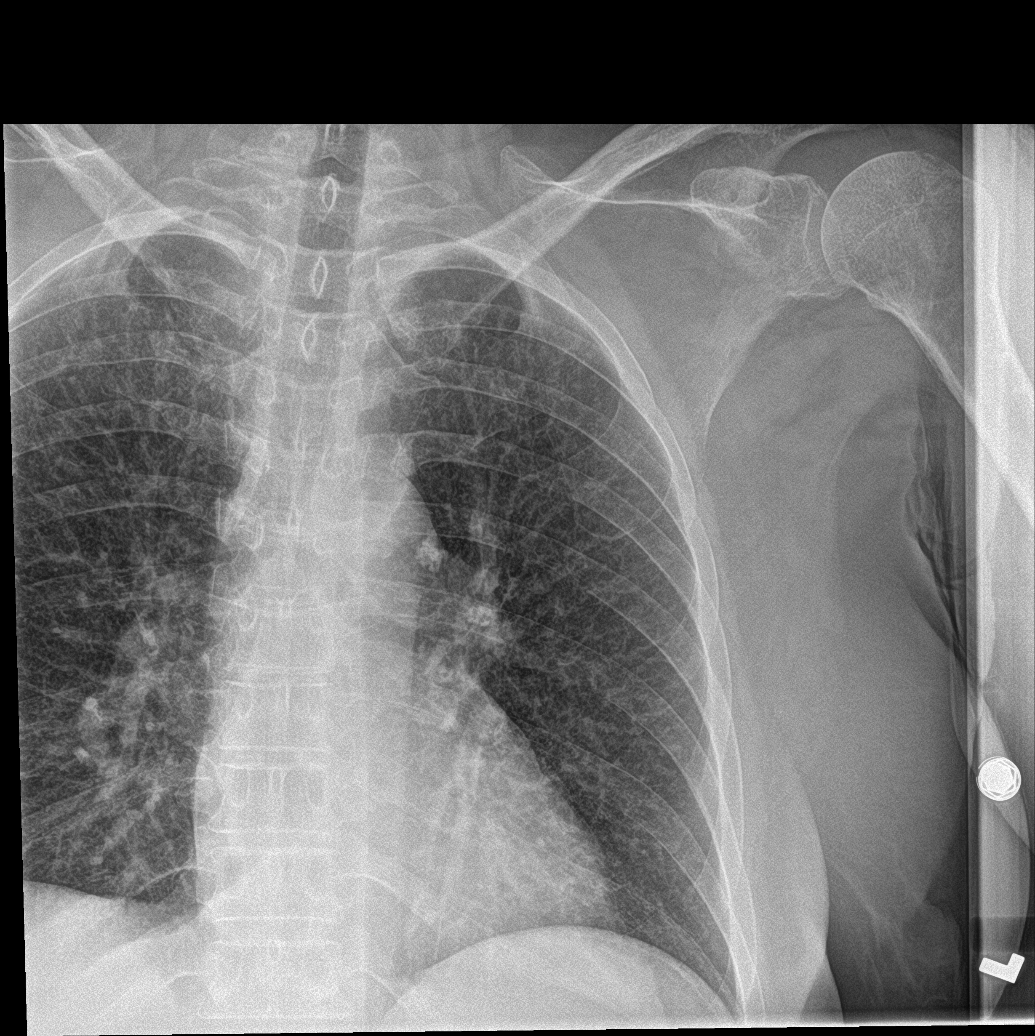

[Series 5: chest ap · 0.13mm/px · 2 of 2 slices shown]
[im 1/2]
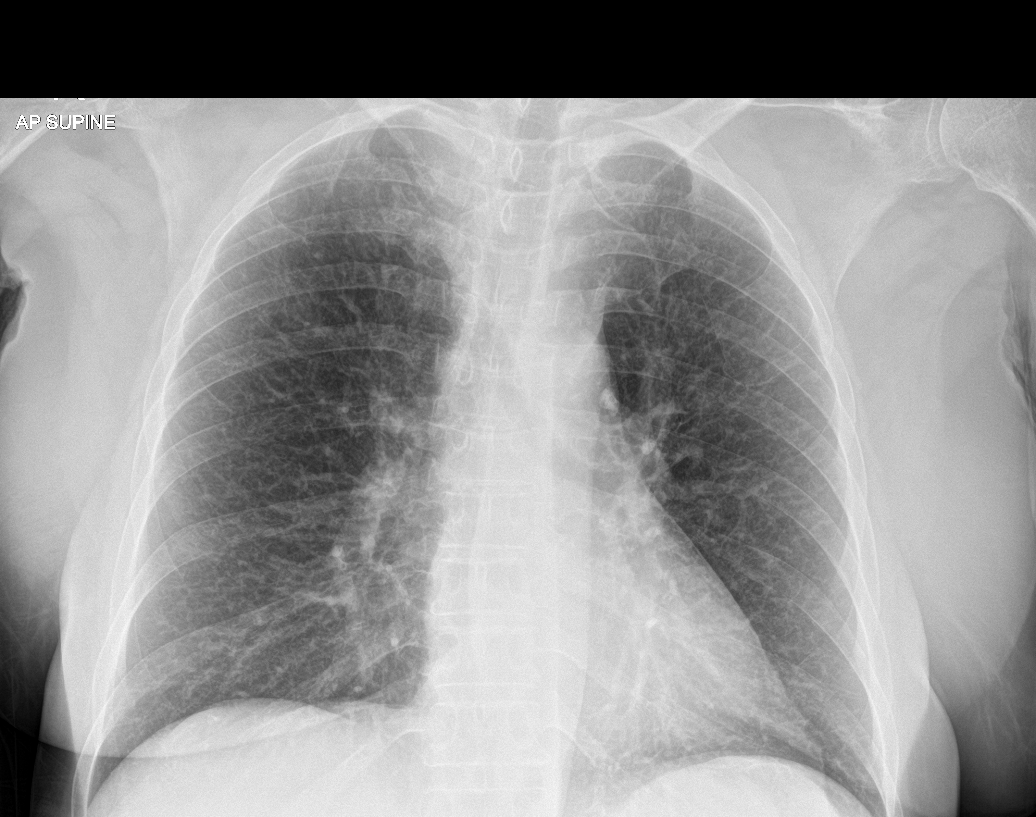
[im 2/2]
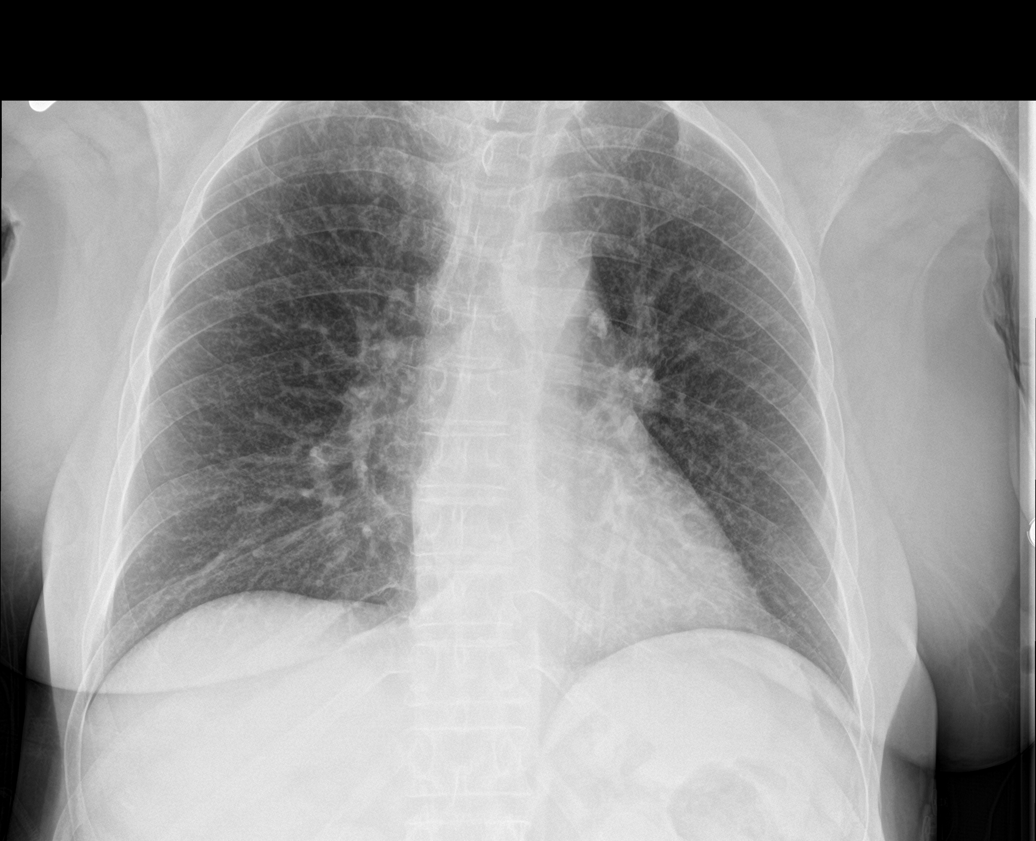

[6 of 6 positions shown; findings below may reference images not displayed]

FINDINGS: There is diffuse chronic interstitial coarsening. No focal
consolidation, pleural effusion, or pneumothorax. The cardiac
silhouette is within limits. Osteopenia with degenerative changes of
the spine. No acute osseous pathology. No displaced rib fractures.
IMPRESSION: 1. No acute cardiopulmonary process.
2. No displaced rib fractures.

## 2020-10-24 NOTE — ED Provider Notes (Signed)
[facility] EMERGENCY DEPARTMENT Provider Note  CSN: [id number] Arrival date & time: 10/24/20 2040    History Chief Complaint  Patient presents with  . Fall  . Altered Mental Status    Cheyenne [name] is a 70 y.o. female brought to the ED by her granddaughter. She had an ischemic stroke at [facility] about 6 weeks ago. Came to live with her granddaughter here about a month ago. She had been doing well, still having some weakness on L arm and leg but has been able to walk with a cane. In the last 4-5 days, however she has had 4 falls. First one she fell onto her L side, has been complaining of some rib pain. She has also fallen backwards, maybe hit her head, complaining of low back pain. She was a little more confused than has been typical for her after a fall yesterday but was back to baseline this afternoon. Home health RN recommended she come to the ED for evaluation.    Past Medical History:  Diagnosis Date  . Diabetes mellitus without complication ([place])   . Stroke [facility])    left sided arm weakness    Past Surgical History:  Procedure Laterality Date  . BACK SURGERY      History reviewed. No pertinent family history.  Social History   Tobacco Use  . Smoking status: Some Days    Packs/day: 0.50    Types: Cigarettes  . Smokeless tobacco: Never  Vaping Use  . Vaping Use: Never used  Substance Use Topics  . Alcohol use: Not Currently  . Drug use: Never     Home Medications Prior to Admission medications   Medication Sig Start Date End Date Taking? Authorizing Provider  albuterol (VENTOLIN HFA) 108 (90 Base) MCG/ACT inhaler Inhale 1-2 puffs into the lungs every 6 (six) hours as needed for wheezing or shortness of breath. 01/18/19   Gertie Baron, NP  amitriptyline (ELAVIL) 50 MG tablet Take 50 mg by mouth at bedtime. 11/05/17   [provider]  amLODipine (NORVASC) 10 MG tablet Take 10 mg by mouth daily. 11/05/17   [provider]  atorvastatin  (LIPITOR) 40 MG tablet Take 40 mg by mouth daily. 11/26/17   [provider]  cetirizine (ZYRTEC) 10 MG tablet Take 10 mg by mouth daily. 11/05/17   [provider]  dicyclomine (BENTYL) 20 MG tablet Take 1 tablet (20 mg total) by mouth 3 (three) times daily as needed for spasms. 11/27/17   Earleen Newport, MD  glimepiride (AMARYL) 1 MG tablet Take 1 mg by mouth daily with breakfast. 11/05/17   [provider]  losartan (COZAAR) 50 MG tablet Take 50 mg by mouth 2 (two) times daily. 11/26/17   [provider]  metFORMIN (GLUCOPHAGE-XR) 500 MG 24 hr tablet Take 500 mg by mouth 2 (two) times daily with a meal. 11/05/17   [provider]  metroNIDAZOLE (FLAGYL) 500 MG tablet Take 1 tablet (500 mg total) by mouth 3 (three) times daily. 11/27/17   Earleen Newport, MD  ondansetron (ZOFRAN ODT) 4 MG disintegrating tablet Take 1 tablet (4 mg total) by mouth every 8 (eight) hours as needed for nausea or vomiting. 11/27/17   Earleen Newport, MD     Allergies    Gabapentin, Pregabalin, and Codeine   Review of Systems   Review of Systems A comprehensive review of systems was completed and negative except as noted in HPI.    Physical Exam  BP (!) 144/71   Pulse 95   Temp 97.8 F (36.6 C) (Oral)   Resp 16   Ht '5\' 4"'$  (1.626 m)   Wt 61.7 kg   SpO2 95%   BMI 23.34 kg/m   Physical Exam Vitals and nursing note reviewed.  Constitutional:      Appearance: Normal appearance.  HENT:     Head: Normocephalic and atraumatic.     Nose: Nose normal.     Mouth/Throat:     Mouth: Mucous membranes are moist.  Eyes:     Extraocular Movements: Extraocular movements intact.     Conjunctiva/sclera: Conjunctivae normal.  Cardiovascular:     Rate and Rhythm: Normal rate.  Pulmonary:     Effort: Pulmonary effort is normal.     Breath sounds: Normal breath sounds.  Chest:     Chest wall: Tenderness (L lower ribs) present.  Abdominal:     General: Abdomen is  flat.     Palpations: Abdomen is soft.     Tenderness: There is no abdominal tenderness.  Musculoskeletal:        General: Tenderness (midline lumbar spine) present. No swelling. Normal range of motion.     Cervical back: Neck supple.  Skin:    General: Skin is warm and dry.  Neurological:     General: No focal deficit present.     Mental Status: She is alert.     Comments: L hemiparesis, otherwise no new focal neuro deficits  Psychiatric:        Mood and Affect: Mood normal.     ED Results / Procedures / Treatments   Labs (all labs ordered are listed, but only abnormal results are displayed) Labs Reviewed  COMPREHENSIVE METABOLIC PANEL  CBC  URINALYSIS, ROUTINE W REFLEX MICROSCOPIC  CBG MONITORING, ED    EKG None  Radiology No results found.  Procedures Procedures  Medications Ordered in the ED Medications - No data to display   MDM Rules/Calculators/A&P MDM Patient with recent stroke, now having more falls. Has not been using her cane all the time. Will check imaging of the areas of pain. Check labs and UA for eval of reported AMS. Currently at baseline.   ED Course  I have reviewed the triage vital signs and the nursing notes.  Pertinent labs & imaging results that were available during my care of the patient were reviewed by me and considered in my medical decision making (see chart for details).  Clinical Course as of 10/24/20 2329  Mon Oct 24, 2020  2154 CBC unremarkable.  [CS]  2225 CMP is normal.  [CS]  [id number] CT head without acute process. Rib and Lumbar spine xrays neg.  [CS]  2328 Care of the patient signed out to Dr. Stark Jock at the change of shift pending UA.  [CS]    Clinical Course User Index [CS] Truddie Hidden, MD    Final Clinical Impression(s) / ED Diagnoses Final diagnoses:  None    Rx / DC Orders ED Discharge Orders     None        Truddie Hidden, MD 10/24/20 2329

## 2020-10-24 NOTE — ED Triage Notes (Signed)
Pt recently had Right frontal CVA on 7/5 seen at [facility]. Since discharge pt has had left sided weakness. Pt has had multiple falls at home since discharge.   Pt being cared for by granddaughter at home, family called RN tonight and they advised to come to ED for eval for possible UTI.

## 2020-10-25 ENCOUNTER — Observation Stay (HOSPITAL_COMMUNITY): Payer: Medicare HMO

## 2020-10-25 DIAGNOSIS — I10 Essential (primary) hypertension: Secondary | ICD-10-CM | POA: Diagnosis present

## 2020-10-25 DIAGNOSIS — F99 Mental disorder, not otherwise specified: Secondary | ICD-10-CM | POA: Diagnosis not present

## 2020-10-25 DIAGNOSIS — Z885 Allergy status to narcotic agent status: Secondary | ICD-10-CM | POA: Diagnosis not present

## 2020-10-25 DIAGNOSIS — R531 Weakness: Secondary | ICD-10-CM

## 2020-10-25 DIAGNOSIS — E1169 Type 2 diabetes mellitus with other specified complication: Secondary | ICD-10-CM | POA: Diagnosis present

## 2020-10-25 DIAGNOSIS — R29705 NIHSS score 5: Secondary | ICD-10-CM | POA: Diagnosis present

## 2020-10-25 DIAGNOSIS — E785 Hyperlipidemia, unspecified: Secondary | ICD-10-CM | POA: Diagnosis present

## 2020-10-25 DIAGNOSIS — Z28310 Unvaccinated for covid-19: Secondary | ICD-10-CM | POA: Diagnosis not present

## 2020-10-25 DIAGNOSIS — I680 Cerebral amyloid angiopathy: Secondary | ICD-10-CM | POA: Diagnosis present

## 2020-10-25 DIAGNOSIS — Z79899 Other long term (current) drug therapy: Secondary | ICD-10-CM | POA: Diagnosis not present

## 2020-10-25 DIAGNOSIS — E854 Organ-limited amyloidosis: Secondary | ICD-10-CM | POA: Diagnosis present

## 2020-10-25 DIAGNOSIS — Z7984 Long term (current) use of oral hypoglycemic drugs: Secondary | ICD-10-CM | POA: Diagnosis not present

## 2020-10-25 DIAGNOSIS — F17210 Nicotine dependence, cigarettes, uncomplicated: Secondary | ICD-10-CM | POA: Diagnosis present

## 2020-10-25 DIAGNOSIS — I639 Cerebral infarction, unspecified: Secondary | ICD-10-CM | POA: Diagnosis present

## 2020-10-25 DIAGNOSIS — E11649 Type 2 diabetes mellitus with hypoglycemia without coma: Secondary | ICD-10-CM | POA: Diagnosis present

## 2020-10-25 DIAGNOSIS — E1165 Type 2 diabetes mellitus with hyperglycemia: Secondary | ICD-10-CM | POA: Diagnosis present

## 2020-10-25 DIAGNOSIS — W010XXA Fall on same level from slipping, tripping and stumbling without subsequent striking against object, initial encounter: Secondary | ICD-10-CM | POA: Diagnosis present

## 2020-10-25 DIAGNOSIS — R296 Repeated falls: Secondary | ICD-10-CM | POA: Diagnosis present

## 2020-10-25 DIAGNOSIS — I69354 Hemiplegia and hemiparesis following cerebral infarction affecting left non-dominant side: Secondary | ICD-10-CM | POA: Diagnosis not present

## 2020-10-25 DIAGNOSIS — M545 Low back pain, unspecified: Secondary | ICD-10-CM | POA: Diagnosis present

## 2020-10-25 DIAGNOSIS — J329 Chronic sinusitis, unspecified: Secondary | ICD-10-CM | POA: Diagnosis present

## 2020-10-25 DIAGNOSIS — G9349 Other encephalopathy: Secondary | ICD-10-CM | POA: Diagnosis present

## 2020-10-25 DIAGNOSIS — Z20822 Contact with and (suspected) exposure to covid-19: Secondary | ICD-10-CM | POA: Diagnosis present

## 2020-10-25 DIAGNOSIS — Z888 Allergy status to other drugs, medicaments and biological substances status: Secondary | ICD-10-CM | POA: Diagnosis not present

## 2020-10-25 LAB — CBC WITH DIFFERENTIAL/PLATELET
Abs Immature Granulocytes: 0.03 10*3/uL (ref 0.00–0.07)
Basophils Absolute: 0.1 10*3/uL (ref 0.0–0.1)
Basophils Relative: 1 %
Eosinophils Absolute: 0.2 10*3/uL (ref 0.0–0.5)
Eosinophils Relative: 2 %
HCT: 45.4 % (ref 36.0–46.0)
Hemoglobin: 14.9 g/dL (ref 12.0–15.0)
Immature Granulocytes: 0 %
Lymphocytes Relative: 13 %
Lymphs Abs: 1.3 10*3/uL (ref 0.7–4.0)
MCH: 29.3 pg (ref 26.0–34.0)
MCHC: 32.8 g/dL (ref 30.0–36.0)
MCV: 89.2 fL (ref 80.0–100.0)
Monocytes Absolute: 1 10*3/uL (ref 0.1–1.0)
Monocytes Relative: 10 %
Neutro Abs: 7.6 10*3/uL (ref 1.7–7.7)
Neutrophils Relative %: 74 %
Platelets: 257 10*3/uL (ref 150–400)
RBC: 5.09 MIL/uL (ref 3.87–5.11)
RDW: 14 % (ref 11.5–15.5)
WBC: 10.1 10*3/uL (ref 4.0–10.5)
nRBC: 0 % (ref 0.0–0.2)

## 2020-10-25 LAB — LIPID PANEL
Cholesterol: 126 mg/dL (ref 0–200)
HDL: 43 mg/dL (ref >40)
LDL Cholesterol: 71 mg/dL (ref 0–99)
Total CHOL/HDL Ratio: 2.9 RATIO
Triglycerides: 62 mg/dL (ref <150)
VLDL: 12 mg/dL (ref 0–40)

## 2020-10-25 LAB — GLUCOSE, CAPILLARY
Glucose-Capillary: 149 mg/dL — ABNORMAL HIGH (ref 70–99)
Glucose-Capillary: 108 mg/dL — ABNORMAL HIGH (ref 70–99)
Glucose-Capillary: 180 mg/dL — ABNORMAL HIGH (ref 70–99)
Glucose-Capillary: 245 mg/dL — ABNORMAL HIGH (ref 70–99)

## 2020-10-25 LAB — COMPREHENSIVE METABOLIC PANEL
ALT: 12 U/L (ref 0–44)
AST: 15 U/L (ref 15–41)
Albumin: 3.6 g/dL (ref 3.5–5.0)
Alkaline Phosphatase: 78 U/L (ref 38–126)
Anion gap: 9 (ref 5–15)
BUN: 16 mg/dL (ref 8–23)
CO2: 21 mmol/L — ABNORMAL LOW (ref 22–32)
Calcium: 8.7 mg/dL — ABNORMAL LOW (ref 8.9–10.3)
Chloride: 108 mmol/L (ref 98–111)
Creatinine, Ser: 0.74 mg/dL (ref 0.44–1.00)
GFR, Estimated: >60 mL/min (ref >60)
Glucose, Bld: 43 mg/dL — CL (ref 70–99)
Potassium: 3.9 mmol/L (ref 3.5–5.1)
Sodium: 138 mmol/L (ref 135–145)
Total Bilirubin: 0.7 mg/dL (ref 0.3–1.2)
Total Protein: 7.2 g/dL (ref 6.5–8.1)

## 2020-10-25 LAB — CBG MONITORING, ED
Glucose-Capillary: 228 mg/dL — ABNORMAL HIGH (ref 70–99)
Glucose-Capillary: 35 mg/dL — CL (ref 70–99)

## 2020-10-25 LAB — HEMOGLOBIN A1C
Hgb A1c MFr Bld: 9 % — ABNORMAL HIGH (ref 4.8–5.6)
Hgb A1c MFr Bld: 9.1 % — ABNORMAL HIGH (ref 4.8–5.6)
Mean Plasma Glucose: 211.6 mg/dL
Mean Plasma Glucose: 214.47 mg/dL

## 2020-10-25 LAB — TSH: TSH: 0.512 u[IU]/mL (ref 0.350–4.500)

## 2020-10-25 LAB — AMMONIA: Ammonia: 11 umol/L (ref 9–35)

## 2020-10-25 LAB — ECHOCARDIOGRAM COMPLETE
AR max vel: 2.99 cm2
AV Area VTI: 2.92 cm2
AV Area mean vel: 2.66 cm2
AV Mean grad: 1.7 mmHg
AV Peak grad: 2.9 mmHg
Ao pk vel: 0.85 m/s
Area-P 1/2: 3.5 cm2
Height: 64 in
S' Lateral: 2.19 cm
Weight: 2176 oz

## 2020-10-25 LAB — MAGNESIUM: Magnesium: 2.1 mg/dL (ref 1.7–2.4)

## 2020-10-25 LAB — HIV ANTIBODY (ROUTINE TESTING W REFLEX): HIV Screen 4th Generation wRfx: NONREACTIVE

## 2020-10-25 LAB — SARS CORONAVIRUS 2 (TAT 6-24 HRS): SARS Coronavirus 2: NEGATIVE

## 2020-10-25 IMAGING — MR MR HEAD W/O CM
11 of 13 series · 25 of 48 positions shown · non-contrast
Comparison: No pertinent prior exam.

CLINICAL DATA: Neuro deficit, acute, stroke suspected

EXAM:
MRI HEAD WITHOUT CONTRAST
MRA HEAD WITHOUT CONTRAST
TECHNIQUE: Multiplanar, multi-echo pulse sequences of the brain and surrounding
structures were acquired without intravenous contrast. Angiographic
images of the Circle of Willis were acquired using MRA technique
without intravenous contrast.

[Series 5: DWI · axial · 4.0mm · 0.88mm/px · z∈[-65,+75]mm · 2 of 36 slices shown (1 of 6)]
[im 1/36]
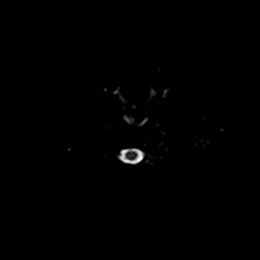
[im 36/36]
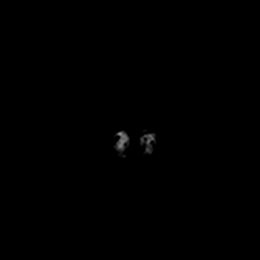

[Series 5: DWI · axial · 4.0mm · 0.88mm/px · z∈[-65,+75]mm · 3 of 36 slices shown (2 of 6)]
[im 1/36]
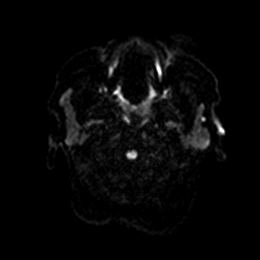
[im 18/36]
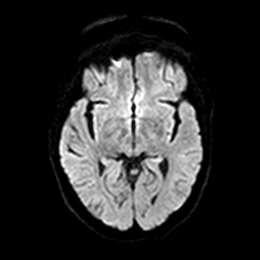
[im 36/36]
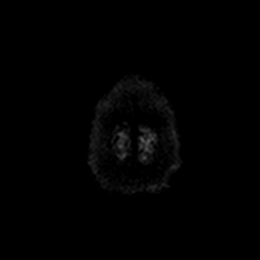

[Series 6: DWI · axial · 4.0mm · 0.88mm/px · z∈[-65,+75]mm · 3 of 36 slices shown (3 of 6)]
[im 1/36]
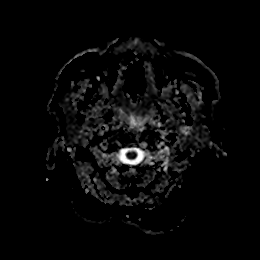
[im 18/36]
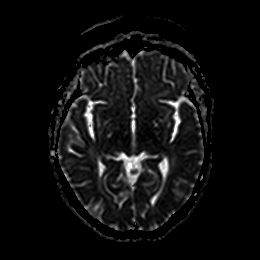
[im 36/36]
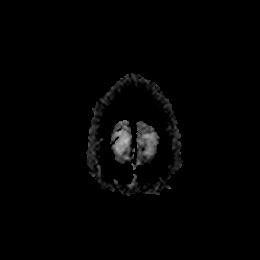

[Series 7: DWI · coronal · 5.0mm · 0.88mm/px · 2 of 26 slices shown (4 of 6)]
[im 1/26]
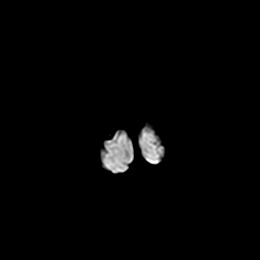
[im 26/26]
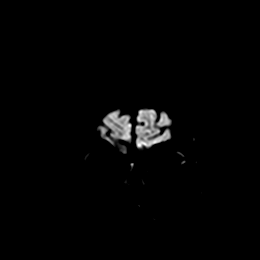

[Series 7: DWI · coronal · 5.0mm · 0.88mm/px · 2 of 26 slices shown (5 of 6)]
[im 1/26]
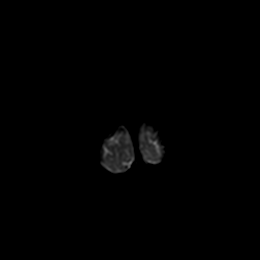
[im 26/26]
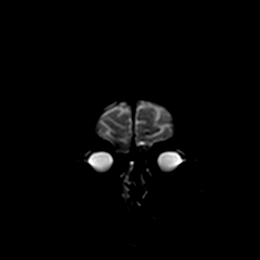

[Series 8: DWI · coronal · 5.0mm · 0.88mm/px · 2 of 26 slices shown (6 of 6)]
[im 1/26]
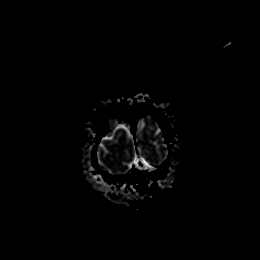
[im 26/26]
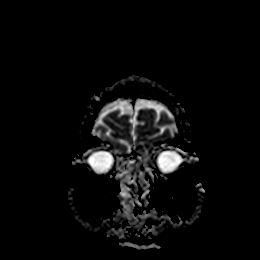

[Series 9: T1 · sagittal · 5.0mm · 0.94mm/px · 2 of 21 slices shown]
[im 1/21]
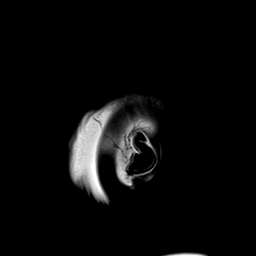
[im 21/21]
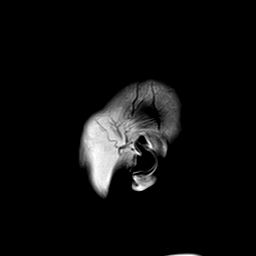

[Series 15: T2 · axial · 5.0mm · 0.72mm/px · z∈[-61,+72]mm · 2 of 20 slices shown (1 of 2)]
[im 1/20]
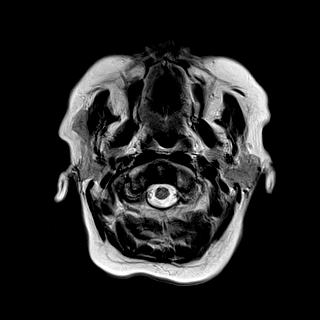
[im 20/20]
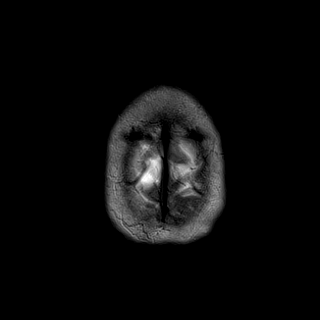

[Series 16: ax hemo · axial · 5.0mm · 0.86mm/px · z∈[-68,+75]mm · 2 of 25 slices shown]
[im 1/25]
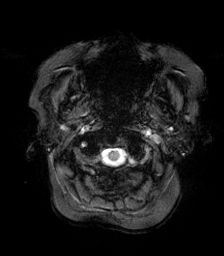
[im 25/25]
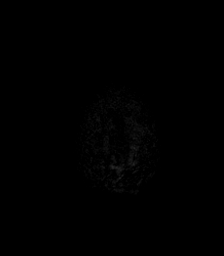

[Series 17: FLAIR · axial · 4.0mm · 0.43mm/px · z∈[-58,+66]mm · 3 of 32 slices shown]
[im 1/32]
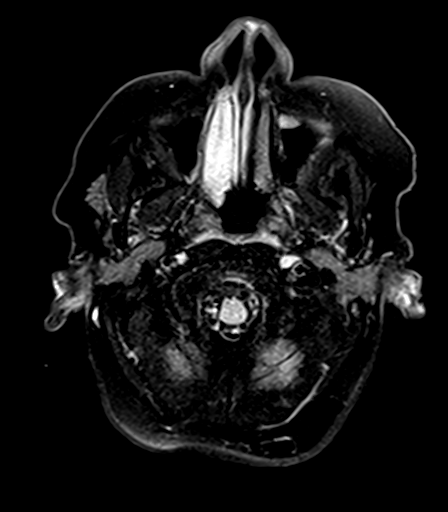
[im 16/32]
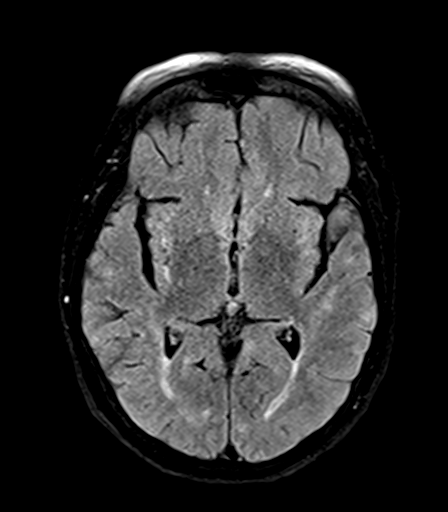
[im 32/32]
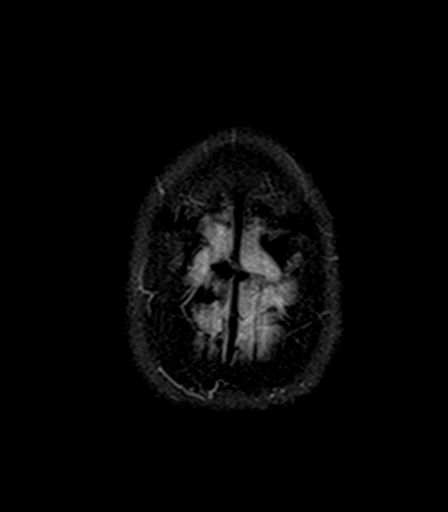

[Series 19: T2 · coronal · 5.0mm · 0.72mm/px · 2 of 28 slices shown (2 of 2)]
[im 1/28]
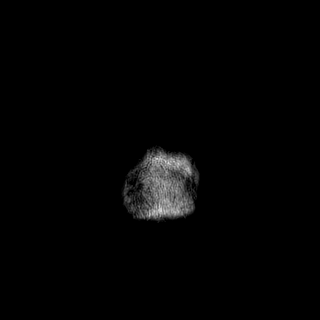
[im 28/28]
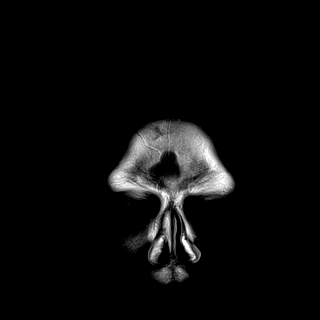

[25 of 48 positions shown; findings below may reference images not displayed]

FINDINGS: MRI HEAD FINDINGS

Brain: Approximately 1.3 cm focus of peripheral restricted diffusion
in the right posterior frontal white matter, most consistent with
acute infarct given the patient's risk factors and other findings of
chronic microvascular disease. Additional punctate areas of infarct
in the more anterior frontal white matter. Moderate to advanced
patchy and T2 hyperintensity in the supratentorial white matter.
There are numerous small foci of susceptibility artifact throughout
the supratentorial brain, predominantly cortical in location and
compatible with prior microhemorrhages. No evidence of acute
hemorrhage, mass lesion, hydrocephalus, or extra-axial fluid
collection.

Vascular: See below.

Skull and upper cervical spine: Normal marrow signal.

Sinuses/Orbits: Moderate mucosal thickening in the right sphenoid
sinus with air-fluid level.

Other: No sizable mastoid effusions.

MRA HEAD FINDINGS

Anterior circulation: Bilateral intracranial ICAs, MCAs, and ACAs
are patent without proximal hemodynamically significant stenosis. No
aneurysm identified.

Posterior circulation: Bilateral intradural vertebral arteries,
basilar artery, and posterior cerebral arteries are patent. Moderate
mid right and severe distal left P2 PCA stenosis.
IMPRESSION: MRI:

1. Approximately 1.3 cm acute infarct in the right frontal white
matter with surrounding punctate acute frontal infarcts.
2. Moderate to advanced T2/FLAIR hyperintensities the white matter.
While nonspecific, findings most likely represent age-advanced
chronic microvascular ischemic disease given the patient's known
risk factors (including diabetes and hypertension).
3. Numerous supratentorial chronic microhemorrhages, potentially
related to amyloid angiopathy given the cortical distribution. These
hemorrhages are atypical for hypertensive hemorrhages given relative
sparing of the basal ganglia, thalami and cerebellum.
4. Moderate mucosal thickening in the right sphenoid sinus with
air-fluid level.

MRA:

1. No large vessel occlusion.
2. Motion limited distal evaluation with suspected severe distal
left P2 PCA and moderate mid right P2 PCA stenosis.

## 2020-10-25 MED ORDER — INSULIN ASPART 100 UNIT/ML IJ SOLN
0.0000 [IU] | Freq: Three times a day (TID) | INTRAMUSCULAR | Status: DC
  Administered 2020-10-25: 2 [IU] via SUBCUTANEOUS

## 2020-10-25 MED ORDER — AMLODIPINE BESYLATE 5 MG PO TABS
10.0000 mg | ORAL_TABLET | Freq: Every day | ORAL | Status: DC
  Administered 2020-10-25 – 2020-10-31 (×7): 10 mg via ORAL
  Filled 2020-10-25 (×7): qty 2

## 2020-10-25 MED ORDER — ONDANSETRON HCL 4 MG PO TABS: 4.0000 mg | ORAL_TABLET | Freq: Four times a day (QID) | ORAL | Status: DC | PRN

## 2020-10-25 MED ORDER — ACETAMINOPHEN 650 MG RE SUPP: 650.0000 mg | Freq: Four times a day (QID) | RECTAL | Status: DC | PRN

## 2020-10-25 MED ORDER — PERFLUTREN LIPID MICROSPHERE
1.0000 mL | INTRAVENOUS | Status: AC | PRN
  Administered 2020-10-25: 2 mL via INTRAVENOUS

## 2020-10-25 MED ORDER — LOSARTAN POTASSIUM 50 MG PO TABS
50.0000 mg | ORAL_TABLET | Freq: Two times a day (BID) | ORAL | Status: DC
  Administered 2020-10-25 – 2020-10-31 (×13): 50 mg via ORAL
  Filled 2020-10-25 (×13): qty 1

## 2020-10-25 MED ORDER — DEXTROSE 50 % IV SOLN: 1.0000 | Freq: Once | INTRAVENOUS | Status: AC

## 2020-10-25 MED ORDER — INSULIN ASPART 100 UNIT/ML IJ SOLN
0.0000 [IU] | Freq: Every day | INTRAMUSCULAR | Status: DC
  Administered 2020-10-25: 2 [IU] via SUBCUTANEOUS
  Administered 2020-10-28: 5 [IU] via SUBCUTANEOUS
  Administered 2020-10-30: 3 [IU] via SUBCUTANEOUS

## 2020-10-25 MED ORDER — INSULIN ASPART 100 UNIT/ML IJ SOLN: 0.0000 [IU] | Freq: Every day | INTRAMUSCULAR | Status: DC

## 2020-10-25 MED ORDER — DEXTROSE 50 % IV SOLN
INTRAVENOUS | Status: AC
  Administered 2020-10-25: 50 mL via INTRAVENOUS
  Filled 2020-10-25: qty 50

## 2020-10-25 MED ORDER — ACETAMINOPHEN 325 MG PO TABS
650.0000 mg | ORAL_TABLET | Freq: Four times a day (QID) | ORAL | Status: DC | PRN
  Administered 2020-10-25: 650 mg via ORAL
  Filled 2020-10-25: qty 2

## 2020-10-25 MED ORDER — LORAZEPAM 2 MG/ML IJ SOLN: 1.0000 mg | Freq: Four times a day (QID) | INTRAMUSCULAR | Status: DC | PRN

## 2020-10-25 MED ORDER — SODIUM CHLORIDE 0.9 % IV BOLUS
500.0000 mL | Freq: Once | INTRAVENOUS | Status: AC
  Administered 2020-10-25: 500 mL via INTRAVENOUS

## 2020-10-25 MED ORDER — ALBUTEROL SULFATE HFA 108 (90 BASE) MCG/ACT IN AERS: 1.0000 | INHALATION_SPRAY | Freq: Four times a day (QID) | RESPIRATORY_TRACT | Status: DC | PRN

## 2020-10-25 MED ORDER — INSULIN ASPART 100 UNIT/ML IJ SOLN
0.0000 [IU] | Freq: Three times a day (TID) | INTRAMUSCULAR | Status: DC
  Administered 2020-10-25 – 2020-10-26 (×2): 2 [IU] via SUBCUTANEOUS
  Administered 2020-10-26: 1 [IU] via SUBCUTANEOUS
  Administered 2020-10-26: 2 [IU] via SUBCUTANEOUS
  Administered 2020-10-27: 3 [IU] via SUBCUTANEOUS
  Administered 2020-10-27: 1 [IU] via SUBCUTANEOUS
  Administered 2020-10-27: 3 [IU] via SUBCUTANEOUS
  Administered 2020-10-28: 2 [IU] via SUBCUTANEOUS
  Administered 2020-10-28: 5 [IU] via SUBCUTANEOUS
  Administered 2020-10-28: 1 [IU] via SUBCUTANEOUS
  Administered 2020-10-29: 5 [IU] via SUBCUTANEOUS
  Administered 2020-10-29 – 2020-10-30 (×3): 3 [IU] via SUBCUTANEOUS
  Administered 2020-10-30: 7 [IU] via SUBCUTANEOUS
  Administered 2020-10-31: 5 [IU] via SUBCUTANEOUS
  Administered 2020-10-31: 2 [IU] via SUBCUTANEOUS
  Administered 2020-10-31: 3 [IU] via SUBCUTANEOUS

## 2020-10-25 MED ORDER — AMITRIPTYLINE HCL 25 MG PO TABS
50.0000 mg | ORAL_TABLET | Freq: Every day | ORAL | Status: DC
  Administered 2020-10-25: 50 mg via ORAL
  Filled 2020-10-25: qty 2

## 2020-10-25 MED ORDER — ATORVASTATIN CALCIUM 40 MG PO TABS
40.0000 mg | ORAL_TABLET | Freq: Every day | ORAL | Status: DC
  Administered 2020-10-25 – 2020-10-31 (×7): 40 mg via ORAL
  Filled 2020-10-25 (×7): qty 1

## 2020-10-25 MED ORDER — STROKE: EARLY STAGES OF RECOVERY BOOK
Freq: Once | Status: DC
  Filled 2020-10-25: qty 1

## 2020-10-25 MED ORDER — ONDANSETRON HCL 4 MG/2ML IJ SOLN: 4.0000 mg | Freq: Four times a day (QID) | INTRAMUSCULAR | Status: DC | PRN

## 2020-10-25 MED ORDER — HEPARIN SODIUM (PORCINE) 5000 UNIT/ML IJ SOLN
5000.0000 [IU] | Freq: Three times a day (TID) | INTRAMUSCULAR | Status: DC
  Administered 2020-10-25 – 2020-10-31 (×17): 5000 [IU] via SUBCUTANEOUS
  Filled 2020-10-25 (×19): qty 1

## 2020-10-25 NOTE — H&P (Signed)
TRH H&P    Patient Demographics:    Cheyenne [name], is a 70 y.o. female  MRN: [id number]  DOB - [birth year]  Admit Date - 10/24/2020  Referring MD/NP/PA: Stark Jock  Outpatient Primary MD for the patient is Toni Arthurs, [place]  Patient coming from: Home  Chief complaint-generalized weakness   HPI:    Cheyenne [name]  is a 70 y.o. female, with history of diabetes mellitus type 2, and stroke 6 weeks ago, presents the ED with a chief complaint of generalized weakness.  Patient has had 4 or 5 falls in the last 5 days.  She reports that today she landed on her face.  She did not have loss of consciousness.  She reports that she tripped and fell over her house slippers.  She did not have any chest pain, palpitations, nausea, presyncopal feeling, or diaphoresis prior to her fall.  She denies any new asymmetrical weakness.  She does have residual left arm and leg weakness from the stroke 6 weeks ago.  She has not had any trouble with vision, speaking, swallowing.  She does not have any change in her hearing either.  Patient reports that she has been excessively fatigued.  Initially the ED attempted to discharge this patient but the daughter feels strongly that we are missing something as she reports that she is worse than when she was admitted to the hospital 6 weeks ago.  That admission was at [facility].  At this time patient is not complaining of any pain.  Earlier in the shift she was complaining of low back pain.  Daughter reported that she is a little more confused than normal, but patient is alert and oriented for me.  Patient has no other complaints at this time.  Patient does not smoke, does not drink alcohol, does not use illicit drugs, is not vaccinated for COVID.  In the ED Temp 97.8, heart rate 91-100, respiratory rate 16-27, blood pressure 149/72, satting at 94% No leukocytosis with a white blood cell count 9.4,  hemoglobin 16.4 Chemistry panel is unremarkable UA is unremarkable X-ray lumbar spine shows no acute bony abnormality X-ray of ribs showed no acute process or displaced rib fracture CT head shows chronic small vessel disease.  No acute intracranial abnormality.  Chronic sinusitis Admission requested for PT consult eval and treat, work-up of progressive weakness    Review of systems:    In addition to the HPI above,  No Fever-chills, No Headache, No changes with Vision or hearing, No problems swallowing food or Liquids, No Chest pain, Cough or Shortness of Breath, No Abdominal pain, No Nausea or Vomiting, bowel movements are regular, No Blood in stool or Urine, No dysuria, No new skin rashes or bruises, No new joints pains-aches,  No recent weight gain or loss, No polyuria, polydypsia or polyphagia, No significant Mental Stressors.  All other systems reviewed and are negative.    Past History of the following :    Past Medical History:  Diagnosis Date   Diabetes mellitus without complication ([place])  Stroke [facility])    left sided arm weakness      Past Surgical History:  Procedure Laterality Date   BACK SURGERY        Social History:      Social History   Tobacco Use   Smoking status: Some Days    Packs/day: 0.50    Types: Cigarettes   Smokeless tobacco: Never  Substance Use Topics   Alcohol use: Not Currently       Family History :    History reviewed. No pertinent family history. Family history hypertension   Home Medications:   Prior to Admission medications   Medication Sig Start Date End Date Taking? Authorizing Provider  albuterol (VENTOLIN HFA) 108 (90 Base) MCG/ACT inhaler Inhale 1-2 puffs into the lungs every 6 (six) hours as needed for wheezing or shortness of breath. 01/18/19   Gertie Baron, NP  amitriptyline (ELAVIL) 50 MG tablet Take 50 mg by mouth at bedtime. 11/05/17   [provider]  amLODipine (NORVASC) 10 MG tablet Take  10 mg by mouth daily. 11/05/17   [provider]  atorvastatin (LIPITOR) 40 MG tablet Take 40 mg by mouth daily. 11/26/17   [provider]  cetirizine (ZYRTEC) 10 MG tablet Take 10 mg by mouth daily. 11/05/17   [provider]  dicyclomine (BENTYL) 20 MG tablet Take 1 tablet (20 mg total) by mouth 3 (three) times daily as needed for spasms. 11/27/17   Earleen Newport, MD  glimepiride (AMARYL) 1 MG tablet Take 1 mg by mouth daily with breakfast. 11/05/17   [provider]  losartan (COZAAR) 50 MG tablet Take 50 mg by mouth 2 (two) times daily. 11/26/17   [provider]  metFORMIN (GLUCOPHAGE-XR) 500 MG 24 hr tablet Take 500 mg by mouth 2 (two) times daily with a meal. 11/05/17   [provider]  metroNIDAZOLE (FLAGYL) 500 MG tablet Take 1 tablet (500 mg total) by mouth 3 (three) times daily. 11/27/17   Earleen Newport, MD  ondansetron (ZOFRAN ODT) 4 MG disintegrating tablet Take 1 tablet (4 mg total) by mouth every 8 (eight) hours as needed for nausea or vomiting. 11/27/17   Earleen Newport, MD     Allergies:     Allergies  Allergen Reactions   Gabapentin     Bad dreams   Pregabalin     Drowsiness. Mind not clear.   Codeine Itching     Physical Exam:   Vitals  Blood pressure (!) 131/58, pulse 94, temperature 97.8 F (36.6 C), temperature source Oral, resp. rate 10, height '5\' 4"'$  (1.626 m), weight 61.7 kg, SpO2 94 %.  1.  General: Patient lying supine in bed,  no acute distress   2. Psychiatric: Alert and oriented x 3, mood and behavior normal for situation, pleasant and cooperative with exam   3. Neurologic: Speech and language are normal, face is symmetric, moves all 4 extremities voluntarily, 4 out of 5 strength in the upper and lower extremity on the left side, normal strength in the right, normal tongue protrusion, cranial nerves intact   4. HEENMT:  Head is atraumatic, normocephalic, pupils reactive to light, neck is  supple, trachea is midline, mucous membranes are moist   5. Respiratory : Lungs are clear to auscultation bilaterally without wheezing, rhonchi, rales, no cyanosis, no increase in work of breathing or accessory muscle use, clubbing present   6. Cardiovascular : Heart rate normal, rhythm is regular, no murmurs, rubs or gallops, no  peripheral edema, peripheral pulses palpated   7. Gastrointestinal:  Abdomen is soft, nondistended, nontender to palpation bowel sounds active, no masses or organomegaly palpated   8. Skin:  Skin is warm, dry and intact without rashes, acute lesions, or ulcers on limited exam   9.Musculoskeletal:  No acute deformities or trauma, no asymmetry in tone, no peripheral edema, peripheral pulses palpated, no tenderness to palpation in the extremities     Data Review:    CBC Recent Labs  Lab 10/24/20 2058  WBC 9.4  HGB 16.4*  HCT 50.5*  PLT 276  MCV 88.9  MCH 28.9  MCHC 32.5  RDW 14.1   ------------------------------------------------------------------------------------------------------------------  Results for orders placed or performed during the hospital encounter of 10/24/20 (from the past 48 hour(s))  Comprehensive metabolic panel     Status: None   Collection Time: 10/24/20  8:58 PM  Result Value Ref Range   Sodium 137 135 - 145 mmol/L   Potassium 4.2 3.5 - 5.1 mmol/L   Chloride 105 98 - 111 mmol/L   CO2 25 22 - 32 mmol/L   Glucose, Bld 93 70 - 99 mg/dL    Comment: Glucose reference range applies only to samples taken after fasting for at least 8 hours.   BUN 15 8 - 23 mg/dL   Creatinine, Ser 0.90 0.44 - 1.00 mg/dL   Calcium 9.5 8.9 - 10.3 mg/dL   Total Protein 8.1 6.5 - 8.1 g/dL   Albumin 4.0 3.5 - 5.0 g/dL   AST 16 15 - 41 U/L   ALT 13 0 - 44 U/L   Alkaline Phosphatase 86 38 - 126 U/L   Total Bilirubin 0.7 0.3 - 1.2 mg/dL   GFR, Estimated >60 >60 mL/min    Comment: (NOTE) Calculated using the CKD-EPI Creatinine Equation (2021)     Anion gap 7 5 - 15    Comment: Performed at [facility], [address] [place], [place] [address]  CBC     Status: Abnormal   Collection Time: 10/24/20  8:58 PM  Result Value Ref Range   WBC 9.4 4.0 - 10.5 K/uL   RBC 5.68 (H) 3.87 - 5.11 MIL/uL   Hemoglobin 16.4 (H) 12.0 - 15.0 g/dL   HCT 50.5 (H) 36.0 - 46.0 %   MCV 88.9 80.0 - 100.0 fL   MCH 28.9 26.0 - 34.0 pg   MCHC 32.5 30.0 - 36.0 g/dL   RDW 14.1 11.5 - 15.5 %   Platelets 276 150 - 400 K/uL   nRBC 0.0 0.0 - 0.2 %    Comment: Performed at [facility], [address], [place], [place] [address]  CBG monitoring, ED     Status: None   Collection Time: 10/24/20  9:01 PM  Result Value Ref Range   Glucose-Capillary 98 70 - 99 mg/dL    Comment: Glucose reference range applies only to samples taken after fasting for at least 8 hours.  Urinalysis, Routine w reflex microscopic Urine, In & Out Cath     Status: Abnormal   Collection Time: 10/24/20 11:18 PM  Result Value Ref Range   Color, Urine YELLOW YELLOW   APPearance CLEAR CLEAR   Specific Gravity, Urine 1.017 1.005 - 1.030   pH 5.0 5.0 - 8.0   Glucose, UA 50 (A) NEGATIVE mg/dL   Hgb urine dipstick NEGATIVE NEGATIVE   Bilirubin Urine NEGATIVE NEGATIVE   Ketones, ur NEGATIVE NEGATIVE mg/dL   Protein, ur NEGATIVE NEGATIVE mg/dL   Nitrite NEGATIVE NEGATIVE  Leukocytes,Ua NEGATIVE NEGATIVE    Comment: Performed at [facility], [address], [place], [place] [address]    Chemistries  Recent Labs  Lab 10/24/20 2058  NA 137  K 4.2  CL 105  CO2 25  GLUCOSE 93  BUN 15  CREATININE 0.90  CALCIUM 9.5  AST 16  ALT 13  ALKPHOS 86  BILITOT 0.7   ------------------------------------------------------------------------------------------------------------------  ------------------------------------------------------------------------------------------------------------------ GFR: Estimated Creatinine Clearance: 50.9 mL/min (by C-G formula based on SCr of 0.9 mg/dL). Liver  Function Tests: Recent Labs  Lab 10/24/20 2058  AST 16  ALT 13  ALKPHOS 86  BILITOT 0.7  PROT 8.1  ALBUMIN 4.0   No results for input(s): LIPASE, AMYLASE in the last 168 hours. No results for input(s): AMMONIA in the last 168 hours. Coagulation Profile: No results for input(s): INR, PROTIME in the last 168 hours. Cardiac Enzymes: No results for input(s): CKTOTAL, CKMB, CKMBINDEX, TROPONINI in the last 168 hours. BNP (last 3 results) No results for input(s): PROBNP in the last 8760 hours. HbA1C: No results for input(s): HGBA1C in the last 72 hours. CBG: Recent Labs  Lab 10/24/20 2101  GLUCAP 98   Lipid Profile: No results for input(s): CHOL, HDL, LDLCALC, TRIG, CHOLHDL, LDLDIRECT in the last 72 hours. Thyroid Function Tests: No results for input(s): TSH, T4TOTAL, FREET4, T3FREE, THYROIDAB in the last 72 hours. Anemia Panel: No results for input(s): VITAMINB12, FOLATE, FERRITIN, TIBC, IRON, RETICCTPCT in the last 72 hours.  --------------------------------------------------------------------------------------------------------------- Urine analysis:    Component Value Date/Time   COLORURINE YELLOW 10/24/2020 2318   APPEARANCEUR CLEAR 10/24/2020 2318   APPEARANCEUR Clear 03/20/2014 2149   LABSPEC 1.017 10/24/2020 2318   LABSPEC 1.005 03/20/2014 2149   PHURINE 5.0 10/24/2020 2318   GLUCOSEU 50 (A) 10/24/2020 2318   GLUCOSEU Negative 03/20/2014 2149   HGBUR NEGATIVE 10/24/2020 2318   [place] NEGATIVE 10/24/2020 2318   BILIRUBINUR Negative 03/20/2014 2149   KETONESUR NEGATIVE 10/24/2020 2318   PROTEINUR NEGATIVE 10/24/2020 2318   NITRITE NEGATIVE 10/24/2020 2318   LEUKOCYTESUR NEGATIVE 10/24/2020 2318   LEUKOCYTESUR Negative 03/20/2014 2149      Imaging Results:    DG Ribs Unilateral W/Chest Left  Result Date: 10/24/2020 CLINICAL DATA:  Fall and left rib pain. EXAM: LEFT RIBS AND CHEST - 3+ VIEW COMPARISON:  Chest radiograph dated 11/04/2012. FINDINGS: There  is diffuse chronic interstitial coarsening. No focal consolidation, pleural effusion, or pneumothorax. The cardiac silhouette is within limits. Osteopenia with degenerative changes of the spine. No acute osseous pathology. No displaced rib fractures. IMPRESSION: 1. No acute cardiopulmonary process. 2. No displaced rib fractures. Electronically Signed   By: Anner Crete M.D.   On: 10/24/2020 22:36   DG Lumbar Spine Complete  Result Date: 10/24/2020 CLINICAL DATA:  Fall, left lower rib pain, low back pain EXAM: LUMBAR SPINE - COMPLETE 4+ VIEW COMPARISON:  None. FINDINGS: Advanced degenerative disc disease changes at L4-5 with disc space loss, vacuum disc and spurring. Degenerative facet disease in the lumbar spine. Normal alignment. No fracture. Diffuse osteopenia. IMPRESSION: No acute bony abnormality. Electronically Signed   By: Rolm Baptise M.D.   On: 10/24/2020 22:34   CT Head Wo Contrast  Result Date: 10/24/2020 CLINICAL DATA:  Head trauma, minor (Age >= 65y) recent stroke affecting L arm/leg at [facility], now falling EXAM: CT HEAD WITHOUT CONTRAST TECHNIQUE: Contiguous axial images were obtained from the base of the skull through the vertex without intravenous contrast. COMPARISON:  None. FINDINGS: Brain: Patchy low-density throughout the  deep white matter, likely chronic small vessel disease. No acute intracranial abnormality. Specifically, no hemorrhage, hydrocephalus, mass lesion, acute infarction, or significant intracranial injury. Vascular: No hyperdense vessel or unexpected calcification. Skull: No acute calvarial abnormality. Sinuses/Orbits: Mucosal thickening throughout the paranasal sinuses, most pronounced in the sphenoid sinuses. Other: None IMPRESSION: Chronic small vessel disease. No acute intracranial abnormality. Chronic sinusitis. Electronically Signed   By: Rolm Baptise M.D.   On: 10/24/2020 22:27    My personal review of EKG: Rhythm NSR, Rate 93 /min, QTc 467 ,no Acute ST changes    Assessment & Plan:    Active Problems:   Generalized weakness   Hypertension   Type 2 diabetes mellitus with hyperlipidemia (HCC)   Psychiatric problem   Generalized weakness CT head is without acute changes MRI brain in a.m. PT eval and treat No infectious symptoms 1 staff member reported that patient had been elsewhere since her discharge from [facility] and just recently moved in with daughter a couple days ago.  Daughter believes the patient is worse than she has been, but it is possible that this is the new baseline the patient was charged at.  In any event patient would likely benefit from home PT at the least Multiple falls Consult PT Diabetes mellitus type 2 Hold oral hypoglycemics Start sliding scale Hypertension Continue amlodipine and losartan Hyperlipidemia Continue statin Psychiatric problem Continue Elavil   DVT Prophylaxis-   heparin - SCDs   AM Labs Ordered, also please review Full Orders  Family Communication: No family at bedside  Code Status: Full  Admission status: Observation      Time spent in minutes : [place] DO

## 2020-10-25 NOTE — Progress Notes (Signed)
Inpatient Diabetes Program Recommendations  AACE/ADA: New Consensus Statement on Inpatient Glycemic Control (2015)  Target Ranges:  Prepandial:   less than 140 mg/dL      [facility] postprandial:   less than 180 mg/dL (1-2 hours)      Critically ill patients:  140 - 180 mg/dL   Lab Results  Component Value Date   GLUCAP 108 (H) 10/25/2020   HGBA1C 9.0 (H) 10/25/2020    Review of Glycemic Control Results for Cheyenne [name], Cheyenne [name] (MRN [id number]) as of 10/25/2020 13:31  Ref. Range 10/24/2020 21:01 10/25/2020 05:57 10/25/2020 06:18 10/25/2020 08:51 10/25/2020 11:16  Glucose-Capillary Latest Ref Range: 70 - 99 mg/dL 98 35 (LL) 228 (H) 149 (H) 108 (H)   Diabetes history: DM 2 Outpatient Diabetes medications:  Amaryl 1 mg daily, NPH 45 units AM and 20 units q PM Current orders for Inpatient glycemic control:  Novolog sensitive tid with meals and HS  Inpatient Diabetes Program Recommendations:    Note low blood sugar this AM.  May have still had NPH on board?  Agree with current orders.  May need low dose basal insulin eventually?  Thanks,  Adah Perl, RN, BC-ADM Inpatient Diabetes Coordinator Pager [phone]  (8a-5p)

## 2020-10-25 NOTE — TOC Initial Note (Signed)
Transition of Care [facility]) - Initial/Assessment Note    Patient Details  Name: Cheyenne [name] MRN: [id number] Date of Birth: Jun 01, 1950  Transition of Care [facility]) CM/SW Contact:    Natasha Bence, LCSW Phone Number: 10/25/2020, 1:12 PM  Clinical Narrative:                 Patient is a 70 year female admitted for Generalized weakness. CSW conducted initial assessment. Patient's daughter reported that prior to patient's stroke in July 2022, patient was fully ambulatory and able to complete all ADL's independently. Patient's daughter reported that patient contacted Covid in July of 2022. Patient has since been experiencing weekness and has not gained back full function of her fine motor skills. Patient's daughter reported that the preference for rehab would be [facility] in [place]. Patient is not fully vaccinated for Covid 19. CSW faxed out patient. PASSR pending. TOC to follow.        Patient Goals and CMS Choice        Expected Discharge Plan and Services                                                Prior Living Arrangements/Services                       Activities of Daily Living Home Assistive Devices/Equipment: Dentures (specify type), Cane (specify quad or straight), Bedside commode/3-in-1, CBG Meter, Walker (specify type) ADL Screening (condition at time of admission) Patient's cognitive ability adequate to safely complete daily activities?: Yes Is the patient deaf or have difficulty hearing?: Yes Does the patient have difficulty seeing, even when wearing glasses/contacts?: No Does the patient have difficulty concentrating, remembering, or making decisions?: Yes Patient able to express need for assistance with ADLs?: Yes Does the patient have difficulty dressing or bathing?: No Independently performs ADLs?: Yes (appropriate for developmental age) Does the patient have difficulty walking or climbing stairs?: Yes Weakness of Legs: Both Weakness of  Arms/Hands: Left  Permission Sought/Granted                  Emotional Assessment              Admission diagnosis:  Generalized weakness [R53.1] Patient Active Problem List   Diagnosis Date Noted   Generalized weakness 10/25/2020   Hypertension 10/25/2020   Type 2 diabetes mellitus with hyperlipidemia ([place]) 10/25/2020   Psychiatric problem 10/25/2020   PCP:  Toni Arthurs, PA Pharmacy:   [place], [place] - [address] [place] [address] Phone: [phone] Fax: [place], [place] - [facility] [address] AT [place] [facility] [place] [address] Phone: [phone] Fax: [phone]     Social Determinants of Health (SDOH) Interventions    Readmission Risk Interventions No flowsheet data found.

## 2020-10-25 NOTE — ED Notes (Signed)
Pts CBG 43, pt awake and alert at this time. Pt given 2 cups of orange juice at this time.

## 2020-10-25 NOTE — Evaluation (Signed)
Speech Language Pathology Evaluation Patient Details Name: Cheyenne [name] MRN: [id number] DOB: 20-Mar-1950 Today's Date: 10/25/2020 Time: [id number] SLP Time Calculation (min) (ACUTE ONLY): 24 min  Problem List:  Patient Active Problem List   Diagnosis Date Noted   Generalized weakness 10/25/2020   Hypertension 10/25/2020   Type 2 diabetes mellitus with hyperlipidemia ([place]) 10/25/2020   Psychiatric problem 10/25/2020   Past Medical History:  Past Medical History:  Diagnosis Date   Diabetes mellitus without complication ([place])    Stroke ([place])    left sided arm weakness   Past Surgical History:  Past Surgical History:  Procedure Laterality Date   BACK SURGERY     HPI:  Cheyenne [name]  is a 70 y.o. female, with history of diabetes mellitus type 2, and stroke 6 weeks ago, presents the ED with a chief complaint of generalized weakness.  Patient has had 4 or 5 falls in the last 5 days.  She reports that today she landed on her face.  She did not have loss of consciousness.  She reports that she tripped and fell over her house slippers.  She did not have any chest pain, palpitations, nausea, presyncopal feeling, or diaphoresis prior to her fall.  She denies any new asymmetrical weakness.  She does have residual left arm and leg weakness from the stroke 6 weeks ago.  She has not had any trouble with vision, speaking, swallowing.  She does not have any change in her hearing either.  Patient reports that she has been excessively fatigued.  Initially the ED attempted to discharge this patient but the daughter feels strongly that we are missing something as she reports that she is worse than when she was admitted to the hospital 6 weeks ago.  That admission was at [facility]. MRI now shows: 1.3 cm acute infarct in the right frontal white   matter with surrounding punctate acute frontal infarcts. SLE requested.   Assessment / Plan / Recommendation Clinical Impression  Pt presents with mild  cognitive deficits characterized by impaired orientation and working memory. Pt indicates that she has had some trouble with her memory since her stroke in July. She lives with her daughter and has appropriate level of supervision to assist with medication management. Pt was surprised to hear that she was at [facility] in [place]. She then required cues for delayed recall of information. Language skills appear intact. Recommend f/u SLP services for cognition at next level of care and after confirming baseline cognitive status (no family present today).     SLP Assessment  SLP Recommendation/Assessment: All further Speech Lanaguage Pathology  needs can be addressed in the next venue of care SLP Visit Diagnosis: Cognitive communication deficit (R41.841)    Follow Up Recommendations  Home health SLP;Skilled Nursing facility    Frequency and Duration           SLP Evaluation Cognition  Overall Cognitive Status: No family/caregiver present to determine baseline cognitive functioning (reportedly some mild baseline "confusion") Arousal/Alertness: Awake/alert Orientation Level: Oriented to person;Oriented to situation;Disoriented to place;Disoriented to time Year: 2022 Month: August Day of Week: Incorrect Memory: Impaired Memory Impairment: Storage deficit;Decreased recall of new information Awareness: Appears intact Problem Solving: Appears intact Executive Function: Self Correcting Safety/Judgment: Appears intact       Comprehension  Auditory Comprehension Overall Auditory Comprehension: Appears within functional limits for tasks assessed Yes/No Questions: Within Functional Limits Commands: Within Functional Limits Conversation: Simple Visual Recognition/Discrimination Discrimination: Within Function Limits Reading Comprehension Reading Status:  Not tested    Expression Expression Primary Mode of Expression: Verbal Verbal Expression Overall Verbal Expression: Appears  within functional limits for tasks assessed Initiation: No impairment Automatic Speech: Name;Social Response;Counting Level of Generative/Spontaneous Verbalization: Conversation Repetition: No impairment Naming: No impairment Pragmatics: No impairment Non-Verbal Means of Communication: Not applicable Written Expression Dominant Hand: Right Written Expression: Not tested   Oral / Motor  Oral Motor/Sensory Function Overall Oral Motor/Sensory Function: Within functional limits Motor Speech Overall Motor Speech: Appears within functional limits for tasks assessed Respiration: Within functional limits Phonation: Normal Resonance: Within functional limits Articulation: Within functional limitis Intelligibility: Intelligible Motor Planning: Witnin functional limits Motor Speech Errors: Not applicable Interfering Components: Inadequate dentition   Thank you,  Genene Churn, [place]                     Azyiah Bo 10/25/2020, 3:11 PM

## 2020-10-25 NOTE — Progress Notes (Addendum)
PROGRESS NOTE  Brief Narrative: FEMALE MASIS is a 70 y.o. 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 Work up in the ED was reassuring including radiographs to r/o fracture from falls and CT head with no acute stroke/hemorrhage on background of chronic small vessel disease. Her mentation appeared somewhat confused which is not her baseline, hospitalists were consulted and the patient was admitted early this morning. She's had wide variations in blood glucose including symptomatic hypoglycemia down to 35 this morning.   Subjective: Pt slowed thinking but oriented. Denies fever, pain, or other complaint this morning. Was resting when I came to visit her.  Objective: BP (!) 145/69 (BP Location: Right Arm)   Pulse 82   Temp 97.8 F (36.6 C) (Oral)   Resp 18   Ht '5\' 4"'$  (1.626 m)   Wt 61.7 kg   SpO2 97%   BMI 23.34 kg/m   Gen: Nontoxic, chronically ill-appearing female in no distress Pulm: Clear and nonlabored on room air  CV: RRR, no murmur, no JVD, no edema GI: Soft, NT, ND, +BS  Neuro: Drowsy but rousable, oriented but very slowed cognitively. Have to ask some questions multiple times. Cooperative with exam but limited in participation. Does appear to have diminished strength on left arm > leg compared to contralateral side. No tremor. Skin: No rashes, lesions or ulcers on visualized skin.  Assessment & Plan: Weakness, falls at home, acute metabolic encephalopathy: Urinalysis negative. CT head nonacute. Blood work largely unremarkable. - PT consulted - MRI ADDENDUM: Wet read per MRI is positive for acute CVA, so will add on MRA, consult neurology. Called pt's contact with no answer. TOC consulted due to SNF recommendation per PT which appears appropriate from my interaction this morning.  - Permissive hyperglycemia as below as hypoglycemia acutely and possibly  sustained at home may be contributing to presentation. - Continue psychiatric medication elavil.   T2DM: Uncontrolled with HbA1c 9%, though brittle with symptomatic hypoglycemia as well.  - Hold metformin, glimepiride. - Will deescalate to sensitive SSI. Permissive hyperglycemia planned due to suspicion for symptomatic and severe hypoglycemia which may have been troubling her at home as well.   History of CVA:  - MRI ordered - Continue statin - Continue statin. LDL is 71, HDL 43.   HTN:  - Continue norvasc, losartan  Patrecia Pour, MD Pager on [facility] 10/25/2020, 9:32 AM

## 2020-10-25 NOTE — Progress Notes (Signed)
  Echocardiogram 2D Echocardiogram has been performed.  Cheyenne [name] 10/25/2020, 11:56 AM

## 2020-10-25 NOTE — NC FL2 (Signed)
[place] MEDICAID FL2 LEVEL OF CARE SCREENING TOOL     IDENTIFICATION  Patient Name: Cheyenne [name] Birthdate: Apr 30, 1950 Sex: female Admission Date (Current Location): 10/24/2020  [facility] and [place] Number:  [employer] and Address:  [place] [address], [place]      Provider Number: [id number]  Attending Physician Name and Address:  Patrecia Pour, MD  Relative Name and Phone Number:  Lanna Poche (Granddaughter)   [phone]    Current Level of Care: SNF Recommended Level of Care: [place] Prior Approval Number:    Date Approved/Denied:   PASRR Number: Pending  Discharge Plan: SNF    Current Diagnoses: Patient Active Problem List   Diagnosis Date Noted   Generalized weakness 10/25/2020   Hypertension 10/25/2020   Type 2 diabetes mellitus with hyperlipidemia ([place]) 10/25/2020   Psychiatric problem 10/25/2020    Orientation RESPIRATION BLADDER Height & Weight     Self, Time, Situation, Place  Normal Continent Weight: 136 lb (61.7 kg) Height:  '5\' 4"'$  (162.6 cm)  BEHAVIORAL SYMPTOMS/MOOD NEUROLOGICAL BOWEL NUTRITION STATUS      Continent Diet (Diet heart healthy/carb modified Room service appropriate? Yes; Fluid consistency: Thin)  AMBULATORY STATUS COMMUNICATION OF NEEDS Skin   Extensive Assist Non-Verbally Normal                       Personal Care Assistance Level of Assistance  Bathing, Feeding, Dressing Bathing Assistance: Maximum assistance Feeding assistance: Independent Dressing Assistance: Maximum assistance     Functional Limitations Info  Sight, Hearing, Speech Sight Info: Adequate Hearing Info: Adequate Speech Info: Adequate    SPECIAL CARE FACTORS FREQUENCY  PT (By licensed PT)     PT Frequency: 5x              Contractures Contractures Info: Not present    Additional Factors Info  Code Status, Allergies, Insulin Sliding Scale Code Status Info: Full Allergies  Info: Gabapentin, Pregabalin, Codeine   Insulin Sliding Scale Info: CBG 70 - 120: 0 units   CBG 121 - 150: 1 unit   CBG 151 - 200: 2 units   CBG 201 - 250: 3 units   CBG 251 - 300: 5 units   CBG 301 - 350: 7 units   CBG 351 - 400 9 units   CBG > 400 call MD and obtain STAT lab verification       Current Medications (10/25/2020):  This is the current hospital active medication list Current Facility-Administered Medications  Medication Dose Route Frequency Provider Last Rate Last Admin    stroke: mapping our early stages of recovery book   Does not apply Once Zierle-Ghosh, [place] B, DO       acetaminophen (TYLENOL) tablet 650 mg  650 mg Oral Q6H PRN Zierle-Ghosh, Asia B, DO       Or   acetaminophen (TYLENOL) suppository 650 mg  650 mg Rectal Q6H PRN Zierle-Ghosh, Asia B, DO       albuterol (VENTOLIN HFA) 108 (90 Base) MCG/ACT inhaler 1-2 puff  1-2 puff Inhalation Q6H PRN Zierle-Ghosh, Asia B, DO       amitriptyline (ELAVIL) tablet 50 mg  50 mg Oral QHS Zierle-Ghosh, Asia B, DO       amLODipine (NORVASC) tablet 10 mg  10 mg Oral Daily Zierle-Ghosh, Asia B, DO   10 mg at 10/25/20 0913   atorvastatin (LIPITOR) tablet 40 mg  40 mg Oral Daily Zierle-Ghosh,  Asia B, DO   40 mg at 10/25/20 0913   heparin injection 5,000 Units  5,000 Units Subcutaneous Q8H Zierle-Ghosh, Asia B, DO       insulin aspart (novoLOG) injection 0-5 Units  0-5 Units Subcutaneous QHS Patrecia Pour, MD       insulin aspart (novoLOG) injection 0-9 Units  0-9 Units Subcutaneous TID WC Patrecia Pour, MD       losartan (COZAAR) tablet 50 mg  50 mg Oral BID Zierle-Ghosh, Asia B, DO   50 mg at 10/25/20 0913   ondansetron (ZOFRAN) tablet 4 mg  4 mg Oral Q6H PRN Zierle-Ghosh, Asia B, DO       Or   ondansetron (ZOFRAN) injection 4 mg  4 mg Intravenous Q6H PRN Zierle-Ghosh, Asia B, DO       perflutren lipid microspheres (DEFINITY) IV suspension  1-10 mL Intravenous PRN Patrecia Pour, MD   2 mL at 10/25/20 1157     Discharge  Medications: Please see discharge summary for a list of discharge medications.  Relevant Imaging Results:  Relevant Lab Results:   Additional Information Pt SSN: [id number]  Natasha Bence, LCSW

## 2020-10-25 NOTE — Progress Notes (Signed)
Patient agitated , wants to " go home and walk out the door. " . She was toileted  on [facility] . Her granddaughter attempted to talk to her without success. Patient still angry and agitated and wants to walk out of the door. Charge RN Stanton Kidney called to help with redirection of patient at this time.

## 2020-10-25 NOTE — Plan of Care (Signed)
  Problem: Acute Rehab OT Goals (only OT should resolve) Goal: Pt. Will Perform Grooming Flowsheets (Taken 10/25/2020 0948) Pt Will Perform Grooming:  standing  with min guard assist  with adaptive equipment Goal: Pt. Will Perform Upper Body Dressing Flowsheets (Taken 10/25/2020 0948) Pt Will Perform Upper Body Dressing:  with supervision  sitting  with adaptive equipment Goal: Pt. Will Perform Lower Body Dressing Flowsheets (Taken 10/25/2020 0948) Pt Will Perform Lower Body Dressing:  with min assist  sitting/lateral leans  with adaptive equipment  sit to/from stand Goal: Pt. Will Transfer To Toilet Flowsheets (Taken 10/25/2020 [phone]) Pt Will Transfer to Toilet:  with supervision  with min guard assist  stand pivot transfer Goal: Pt/Caregiver Will Perform Home Exercise Program Flowsheets (Taken 10/25/2020 [phone]) Pt/caregiver will Perform Home Exercise Program:  Increased ROM  Both right and left upper extremity  With minimal assist  Amaryah Mallen OT, MOT

## 2020-10-25 NOTE — Evaluation (Signed)
Physical Therapy Evaluation Patient Details Name: Cheyenne [name] MRN: [id number] DOB: 09-09-1950 Today's Date: 10/25/2020   History of Present Illness  Cheyenne [name]  is a 70 y.o. female, with history of diabetes mellitus type 2, and stroke 6 weeks ago, presents the ED with a chief complaint of generalized weakness.  Patient has had 4 or 5 falls in the last 5 days.  She reports that today she landed on her face.  She did not have loss of consciousness.  She reports that she tripped and fell over her house slippers.  She did not have any chest pain, palpitations, nausea, presyncopal feeling, or diaphoresis prior to her fall.  She denies any new asymmetrical weakness.  She does have residual left arm and leg weakness from the stroke 6 weeks ago.  She has not had any trouble with vision, speaking, swallowing.  She does not have any change in her hearing either.  Patient reports that she has been excessively fatigued.  Initially the ED attempted to discharge this patient but the daughter feels strongly that we are missing something as she reports that she is worse than when she was admitted to the hospital 6 weeks ago.  That admission was at [facility].  At this time patient is not complaining of any pain.  Earlier in the shift she was complaining of low back pain.  Daughter reported that she is a little more confused than normal, but patient is alert and oriented for me.  Patient has no other complaints at this time.   Clinical Impression  Patient demonstrates slow labored movement for sitting up at bedside, very unsteady on feet and unable to maintain standing balance without use of AD, limited to a few steps at bedside using RW before having to sit due to fatigue and generalized weakness.  Patient put back to bed to bed after therapy.  Patient will benefit from continued physical therapy in hospital and recommended venue below to increase strength, balance, endurance for safe ADLs and gait.       Follow Up Recommendations SNF    Equipment Recommendations  None recommended by PT    Recommendations for Other Services       Precautions / Restrictions Precautions Precautions: Fall Restrictions Weight Bearing Restrictions: No      Mobility  Bed Mobility Overal bed mobility: Needs Assistance Bed Mobility: Sit to Supine;Supine to Sit     Supine to sit: Mod assist Sit to supine: Mod assist;Max assist   General bed mobility comments: as per OT notes    Transfers Overall transfer level: Needs assistance Equipment used: Rolling walker (2 wheeled) Transfers: Sit to/from Stand Sit to Stand: Min assist         General transfer comment: as per OT notes  Ambulation/Gait Ambulation/Gait assistance: Mod assist Gait Distance (Feet): 5 Feet Assistive device: Rolling walker (2 wheeled) Gait Pattern/deviations: Decreased step length - right;Decreased step length - left;Decreased stride length Gait velocity: decreased   General Gait Details: limited to a few steps at bedside with slow labored movement, unable to maintain standing balance without use of RW  Stairs            Wheelchair Mobility    Modified Rankin (Stroke Patients Only)       Balance Overall balance assessment: Needs assistance Sitting-balance support: Feet supported;No upper extremity supported Sitting balance-Leahy Scale: Poor Sitting balance - Comments: fair/poor seated at EOB, fair supporting self with BUE Postural control: Posterior lean;Left lateral lean Standing balance  support: During functional activity;No upper extremity supported Standing balance-Leahy Scale: Poor Standing balance comment: fair/poor using RW                             Pertinent Vitals/Pain Pain Assessment: Faces Faces Pain Scale: Hurts little more Pain Location: low back Pain Descriptors / Indicators: Grimacing;Moaning Pain Intervention(s): Limited activity within patient's tolerance;Monitored  during session;Repositioned    Home Living Family/patient expects to be discharged to:: Private residence Living Arrangements: Other relatives Available Help at Discharge: Family;Available 24 hours/day Type of Home: House Home Access: Stairs to enter Entrance Stairs-Rails: Right Entrance Stairs-Number of Steps: 4 Home Layout: One level Home Equipment: Walker - 2 wheels;Cane - single point;Shower seat Additional Comments: Information per patient, who may be poor historian    Prior Function Level of Independence: Needs assistance   Gait / Transfers Assistance Needed: household and short distanced community without AD, "per patient"  ADL's / Homemaking Assistance Needed: assisted by family  Comments: Unsure of pt's reliability in prior living history. Pt not oriented to place or month.     Hand Dominance   Dominant Hand: Right    Extremity/Trunk Assessment   Upper Extremity Assessment Upper Extremity Assessment: Defer to OT evaluation RUE Deficits / Details: Pt limited to ~90* A/ROM of shoulder flexion while supine in bed with HOB elevated. WFL P/ROM for shoulder flexion and abduction. 4-/4 grip strength. RUE Coordination: decreased fine motor LUE Deficits / Details: Pt presents with L side weakness from previous stroke. Limited to ~75* A/ROM of shoulder flexion. WFL P/ROM of shoulder flexion and abduction with grimacing during movement. 3+/5 grip strength. LUE Coordination: decreased fine motor    Lower Extremity Assessment Lower Extremity Assessment: Generalized weakness;LLE deficits/detail LLE Deficits / Details: grossly -4/5 LLE Sensation: WNL LLE Coordination: decreased fine motor    Cervical / Trunk Assessment Cervical / Trunk Assessment: Normal  Communication   Communication: No difficulties  Cognition Arousal/Alertness: Awake/alert Behavior During Therapy: WFL for tasks assessed/performed Overall Cognitive Status: No family/caregiver present to determine  baseline cognitive functioning                                 General Comments: Pt not oriented to place or month.      General Comments      Exercises     Assessment/Plan    PT Assessment Patient needs continued PT services  PT Problem List Decreased strength;Decreased activity tolerance;Decreased balance;Decreased mobility       PT Treatment Interventions Gait training;Stair training;Functional mobility training;Therapeutic activities;Therapeutic exercise;Patient/family education;DME instruction;Balance training    PT Goals (Current goals can be found in the Care Plan section)  Acute Rehab PT Goals Patient Stated Goal: return home PT Goal Formulation: With patient Time For Goal Achievement: 11/08/20 Potential to Achieve Goals: Good    Frequency Min 3X/week   Barriers to discharge        Co-evaluation               AM-PAC PT "6 Clicks" Mobility  Outcome Measure Help needed turning from your back to your side while in a flat bed without using bedrails?: A Little Help needed moving from lying on your back to sitting on the side of a flat bed without using bedrails?: A Lot Help needed moving to and from a bed to a chair (including a wheelchair)?: A Lot Help needed standing  up from a chair using your arms (e.g., wheelchair or bedside chair)?: A Lot Help needed to walk in hospital room?: A Lot Help needed climbing 3-5 steps with a railing? : Total 6 Click Score: 12    End of Session   Activity Tolerance: Patient tolerated treatment well;Patient limited by fatigue Patient left: in bed;with call bell/phone within reach Nurse Communication: Mobility status PT Visit Diagnosis: Unsteadiness on feet (R26.81);Other abnormalities of gait and mobility (R26.89);Muscle weakness (generalized) (M62.81)    Time: [id number] PT Time Calculation (min) (ACUTE ONLY): 21 min   Charges:   PT Evaluation $PT Eval Moderate Complexity: 1 Mod PT  Treatments $Therapeutic Activity: 8-22 mins        11:18 AM, 10/25/20 Lonell Grandchild, MPT Physical Therapist with [facility] [phone] office [phone] mobile phone

## 2020-10-25 NOTE — Plan of Care (Signed)
  Problem: Acute Rehab PT Goals(only PT should resolve) Goal: Pt will Roll Supine to Side Outcome: Progressing Flowsheets (Taken 10/25/2020 1120) Pt will Roll Supine to Side:  with min assist  with supervision Goal: Patient Will Transfer Sit To/From Stand Outcome: Progressing Flowsheets (Taken 10/25/2020 1120) Patient will transfer sit to/from stand:  with min guard assist  with minimal assist Goal: Pt Will Transfer Bed To Chair/Chair To Bed Outcome: Progressing Flowsheets (Taken 10/25/2020 1120) Pt will Transfer Bed to Chair/Chair to Bed:  min guard assist  with min assist Goal: Pt Will Ambulate Outcome: Progressing Flowsheets (Taken 10/25/2020 1120) Pt will Ambulate:  50 feet  with minimal assist  with rolling walker   11:20 AM, 10/25/20 Lonell Grandchild, MPT Physical Therapist with [facility] [phone] office [phone] mobile phone

## 2020-10-25 NOTE — Evaluation (Signed)
Occupational Therapy Evaluation Patient Details Name: Cheyenne [name] MRN: [id number] DOB: 01-10-1951 Today's Date: 10/25/2020    History of Present Illness Cheyenne [name]  is a 70 y.o. female, with history of diabetes mellitus type 2, and stroke 6 weeks ago, presents the ED with a chief complaint of generalized weakness.  Patient has had 4 or 5 falls in the last 5 days.  She reports that today she landed on her face.  She did not have loss of consciousness.  She reports that she tripped and fell over her house slippers.  She did not have any chest pain, palpitations, nausea, presyncopal feeling, or diaphoresis prior to her fall.  She denies any new asymmetrical weakness.  She does have residual left arm and leg weakness from the stroke 6 weeks ago.  She has not had any trouble with vision, speaking, swallowing.  She does not have any change in her hearing either.  Patient reports that she has been excessively fatigued.  Initially the ED attempted to discharge this patient but the daughter feels strongly that we are missing something as she reports that she is worse than when she was admitted to the hospital 6 weeks ago.  That admission was at [facility].  At this time patient is not complaining of any pain.  Earlier in the shift she was complaining of low back pain.  Daughter reported that she is a little more confused than normal, but patient is alert and oriented for me.  Patient has no other complaints at this time.   Clinical Impression   Pt agreeable to OT evaluation this date. Pt not oriented to place or month. Pt reported history but no family present to confirm prior living situation. Pt demonstrates B UE weakness limited to 90* or less of shoulder A/ROM. Pt presents with residual L side weakness from previous stoke. Pt required mod to max A for bed mobility with reports of back pain during movement. Pt required min to mod A for sit to stand and ambulation at EOB. Pt will benefit from continued  OT in the hospital and recommended venue below to increase strength, balance, and endurance for safe ADL's.       Follow Up Recommendations  SNF    Equipment Recommendations  None recommended by OT           Precautions / Restrictions Precautions Precautions: Fall Restrictions Weight Bearing Restrictions: No      Mobility Bed Mobility Overal bed mobility: Needs Assistance Bed Mobility: Sit to Supine;Supine to Sit     Supine to sit: Mod assist Sit to supine: Mod assist;Max assist   General bed mobility comments: slow labored movement; reports of back pain during mobility    Transfers Overall transfer level: Needs assistance Equipment used: Rolling walker (2 wheeled) Transfers: Sit to/from Stand Sit to Stand: Min assist         General transfer comment: Min A for initial sit to stand with min to mod A for ambulation forward, backward, and to L side. Assist to manage RW.    Balance Overall balance assessment: Needs assistance Sitting-balance support: Feet supported;No upper extremity supported Sitting balance-Leahy Scale: Poor Sitting balance - Comments: poor to fair at EOB Postural control: Posterior lean;Left lateral lean Standing balance support: Bilateral upper extremity supported;During functional activity Standing balance-Leahy Scale: Poor Standing balance comment: poor to fair using RW  ADL either performed or assessed with clinical judgement   ADL Overall ADL's : Needs assistance/impaired                         Toilet Transfer: Moderate assistance;Minimal assistance;RW;Stand-pivot Armed forces technical officer Details (indicate cue type and reason): partially simulated via sit to stand from EOB with RW                 Vision Baseline Vision/History: 0 No visual deficits Ability to See in Adequate Light: 0 Adequate Patient Visual Report: No change from baseline                  Pertinent Vitals/Pain  Pain Assessment: Faces Faces Pain Scale: Hurts little more Pain Location: low back during movement Pain Descriptors / Indicators: Grimacing;Moaning Pain Intervention(s): Limited activity within patient's tolerance;Monitored during session;Repositioned     Hand Dominance Right   Extremity/Trunk Assessment Upper Extremity Assessment Upper Extremity Assessment: RUE deficits/detail;LUE deficits/detail RUE Deficits / Details: Pt limited to ~90* A/ROM of shoulder flexion while supine in bed with HOB elevated. WFL P/ROM for shoulder flexion and abduction. 4-/4 grip strength. RUE Coordination: decreased fine motor LUE Deficits / Details: Pt presents with L side weakness from previous stroke. Limited to ~75* A/ROM of shoulder flexion. WFL P/ROM of shoulder flexion and abduction with grimacing during movement. 3+/5 grip strength. LUE Coordination: decreased fine motor   Lower Extremity Assessment Lower Extremity Assessment: Defer to PT evaluation   Cervical / Trunk Assessment Cervical / Trunk Assessment: Normal   Communication Communication Communication: No difficulties   Cognition Arousal/Alertness: Awake/alert Behavior During Therapy: WFL for tasks assessed/performed Overall Cognitive Status: No family/caregiver present to determine baseline cognitive functioning                                 General Comments: Pt not oriented to place or month.              Home Living Family/patient expects to be discharged to:: Private residence Living Arrangements: Other relatives Advertising account executive) Available Help at Discharge: Family;Available 24 hours/day (per pt report) Type of Home: House Home Access: Stairs to enter [employer] of Steps: 4 Entrance Stairs-Rails: Right Home Layout: One level     Bathroom Shower/Tub: Occupational psychologist: Standard Bathroom Accessibility: Yes How Accessible: Accessible via walker Home Equipment: [place] - 2  wheels;Cane - single point;Shower seat   Additional Comments: Unsure of pt's reliability in prior living history. Pt not oriented to place or month.      Prior Functioning/Environment Level of Independence: Independent        Comments: Unsure of pt's reliability in prior living history. Pt not oriented to place or month.        OT Problem List: Decreased strength;Decreased range of motion;Decreased activity tolerance;Impaired balance (sitting and/or standing);Decreased cognition;Decreased safety awareness;Decreased knowledge of use of DME or AE;Impaired UE functional use      OT Treatment/Interventions: Self-care/ADL training;Therapeutic exercise;DME and/or AE instruction;Neuromuscular education;Manual therapy;Cognitive remediation/compensation;Patient/family education;Balance training    OT Goals(Current goals can be found in the care plan section) Acute Rehab OT Goals Patient Stated Goal: pt did not state a goal; likely would like to return home with family to assist Time For Goal Achievement: 11/08/20 Potential to Achieve Goals: Fair  OT Frequency: Min 2X/week    AM-PAC OT "6 Clicks" Daily Activity     Outcome Measure  Help from another person eating meals?: A Little Help from another person taking care of personal grooming?: A Little Help from another person toileting, which includes using toliet, bedpan, or urinal?: A Lot Help from another person bathing (including washing, rinsing, drying)?: A Lot Help from another person to put on and taking off regular upper body clothing?: A Lot Help from another person to put on and taking off regular lower body clothing?: A Lot 6 Click Score: 14   End of Session Equipment Utilized During Treatment: Surveyor, mining Communication: Other (comment) (Nurse observed later portion of session.)  Activity Tolerance: Patient tolerated treatment well Patient left: in bed;with call bell/phone within reach;with nursing/sitter in room  OT  Visit Diagnosis: Unsteadiness on feet (R26.81);Repeated falls (R29.6);History of falling (Z91.81);Muscle weakness (generalized) (M62.81);Other symptoms and signs involving cognitive function;Hemiplegia and hemiparesis Hemiplegia - Right/Left: Left Hemiplegia - dominant/non-dominant: Non-Dominant Hemiplegia - caused by:  (Prior stroke)                [id number] OT Time Calculation (min): 16 min Charges:  OT General Charges $OT Visit: 1 Visit OT Evaluation $OT Eval Moderate Complexity: 1 Mod  Eilee Schader OT, MOT  Larey Seat 10/25/2020, 9:46 AM

## 2020-10-25 NOTE — Progress Notes (Signed)
Patient redirected and agreed to stay , patient laying in bed at this time. Reminded patient to use call light and we would toilet her anytime. She was agitated about the purewick but was unable to express at first. Her granddauther made [name] aware that she would want to get up and use toilet. Will check every 2 hours to see if patient needs to be toileted

## 2020-10-26 DIAGNOSIS — R531 Weakness: Secondary | ICD-10-CM | POA: Diagnosis not present

## 2020-10-26 LAB — GLUCOSE, CAPILLARY
Glucose-Capillary: 161 mg/dL — ABNORMAL HIGH (ref 70–99)
Glucose-Capillary: 168 mg/dL — ABNORMAL HIGH (ref 70–99)
Glucose-Capillary: 123 mg/dL — ABNORMAL HIGH (ref 70–99)
Glucose-Capillary: 152 mg/dL — ABNORMAL HIGH (ref 70–99)

## 2020-10-26 MED ORDER — ESCITALOPRAM OXALATE 10 MG PO TABS
10.0000 mg | ORAL_TABLET | Freq: Every day | ORAL | Status: DC
  Administered 2020-10-26 – 2020-10-31 (×6): 10 mg via ORAL
  Filled 2020-10-26 (×6): qty 1

## 2020-10-26 MED ORDER — ASPIRIN EC 81 MG PO TBEC
81.0000 mg | DELAYED_RELEASE_TABLET | Freq: Every day | ORAL | Status: DC
  Administered 2020-10-26 – 2020-10-31 (×6): 81 mg via ORAL
  Filled 2020-10-26 (×6): qty 1

## 2020-10-26 MED ORDER — ROPINIROLE HCL 0.25 MG PO TABS
0.2500 mg | ORAL_TABLET | Freq: Every day | ORAL | Status: DC
  Administered 2020-10-26 – 2020-10-30 (×5): 0.25 mg via ORAL
  Filled 2020-10-26 (×5): qty 1

## 2020-10-26 MED ORDER — ROPINIROLE HCL 0.25 MG PO TABS: 0.2500 mg | ORAL_TABLET | Freq: Every day | ORAL | Status: DC

## 2020-10-26 MED ORDER — LORATADINE 10 MG PO TABS
10.0000 mg | ORAL_TABLET | Freq: Every day | ORAL | Status: DC
  Administered 2020-10-26 – 2020-10-31 (×6): 10 mg via ORAL
  Filled 2020-10-26 (×6): qty 1

## 2020-10-26 MED ORDER — CLONAZEPAM 0.5 MG PO TABS
0.5000 mg | ORAL_TABLET | Freq: Two times a day (BID) | ORAL | Status: DC | PRN
  Administered 2020-10-26 – 2020-10-31 (×5): 0.5 mg via ORAL
  Filled 2020-10-26 (×5): qty 1

## 2020-10-26 NOTE — TOC Progression Note (Signed)
Transition of Care [facility]) - Progression Note    Patient Details  Name: Cheyenne [name] MRN: [id number] Date of Birth: 1950/10/28  Transition of Care [facility]) CM/SW Contact  Natasha Bence, LCSW Phone Number: 10/26/2020, 1:36 PM  Clinical Narrative:    To Whom it may Concern:   Please be advised that the above name patient will require a short-term nursing stay - anticipated 30 days or less rehabilitation and strengthening. The plan is for return home.         Expected Discharge Plan and Services                                                 Social Determinants of Health (SDOH) Interventions    Readmission Risk Interventions No flowsheet data found.

## 2020-10-26 NOTE — Progress Notes (Addendum)
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  Work-up in the ED was reassuring including radiographs to rule out fracture from falls and CT head without acute stroke of hemorrhage.  Her mentation appears somewhat confused which is not her baseline, hospitalist were consulted and the patient was admitted early for further evaluation.  Patient has had wide variation in blood glucose including symptomatic hypoglycemia down to 35.   MRI was positive for acute stroke.   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  MRI; 1.3 cm acute infarct in the right frontal white matter with surrounding punctate acute frontal infarcts. MRA; No large Vessel Occlusion.  On aspirin. Statins.  Awaiting Neurology evaluation.  LDL 71.   2-DM type 2.  Hold metformin and glimepiride.  Had Hypoglycemic episodes.  Continue with SSI.  A1c at 9.   History of CVA;  Continue with statins, aspirin.    HTN: Continue with Norvasc and losartan.  Estimated body mass index is 23.46 kg/m as calculated from the following:   Height as of this encounter: '5\' 4"'$  (1.626 m).   Weight as of this encounter: 62 kg.   DVT prophylaxis: Heparin Code Status: Full code Family Communication: grandaughter over phone.  Disposition Plan:  Status is: Inpatient  Remains inpatient appropriate because:IV  treatments appropriate due to intensity of illness or inability to take PO  Dispo: The patient is from: Home              Anticipated d/c is to: SNF              Patient currently is not medically stable to d/c.   Difficult to place patient No        Consultants:  Neurology.   Procedures:  ECHO  Antimicrobials:    Subjective: She is alert, following command. Denies pain.   Objective: Vitals:   10/25/20 2107 10/26/20 0500 10/26/20 0552 10/26/20 1226  BP: 112/65  127/60 136/79  Pulse: 83  77 95  Resp: '19  18 17  '$ Temp: 98.1 F (36.7 C)  (!) 97.5 F (36.4 C) 98.4 F (36.9 C)  TempSrc:   Oral Oral  SpO2: 98%  95% 96%  Weight:  62 kg    Height:        Intake/Output Summary (Last 24 hours) at 10/26/2020 1513 Last data filed at 10/26/2020 1443 Gross per 24 hour  Intake 630 ml  Output --  Net 630 ml   Filed Weights   10/24/20 2043 10/26/20 0500  Weight: 61.7 kg 62 kg    Examination:  General exam: Appears calm and comfortable  Respiratory system: Clear to auscultation. Respiratory effort normal. Cardiovascular system: S1 & S2 heard, RRR. No JVD, murmurs, rubs, gallops or clicks. No pedal edema. Gastrointestinal system: Abdomen is nondistended, soft and nontender. No organomegaly or masses felt. Normal bowel sounds heard. Central nervous system: Alert, oriented to person, left side drift ,  weakness,  Extremities: trace edema     Data Reviewed: I have personally reviewed following labs and imaging studies  CBC: Recent Labs  Lab 10/24/20 2058 10/25/20 0432  WBC 9.4 10.1  NEUTROABS  --  7.6  HGB 16.4* 14.9  HCT 50.5* 45.4  MCV 88.9 89.2  PLT 276 [id number]   Basic Metabolic Panel: Recent Labs  Lab 10/24/20 2058 10/25/20 0432  NA 137 138  K 4.2 3.9  CL 105 108  CO2 25 21*  GLUCOSE 93 43*  BUN 15 16  CREATININE 0.90 0.74  CALCIUM 9.5 8.7*  MG  --  2.1   GFR: Estimated Creatinine Clearance: 57.3 mL/min (by C-G formula based on SCr of 0.74  mg/dL). Liver Function Tests: Recent Labs  Lab 10/24/20 2058 10/25/20 0432  AST 16 15  ALT 13 12  ALKPHOS 86 78  BILITOT 0.7 0.7  PROT 8.1 7.2  ALBUMIN 4.0 3.6   No results for input(s): LIPASE, AMYLASE in the last 168 hours. Recent Labs  Lab 10/25/20 1024  AMMONIA 11   Coagulation Profile: No results for input(s): INR, PROTIME in the last 168 hours. Cardiac Enzymes: No results for input(s): CKTOTAL, CKMB, CKMBINDEX, TROPONINI in the last 168 hours. BNP (last 3 results) No results for input(s): PROBNP in the last 8760 hours. HbA1C: Recent Labs    10/25/20 0432  HGBA1C 9.1*  9.0*   CBG: Recent Labs  Lab 10/25/20 1116 10/25/20 1602 10/25/20 2107 10/26/20 0745 10/26/20 1124  GLUCAP 108* 180* 245* 161* 168*   Lipid Profile: Recent Labs    10/25/20 0432  CHOL 126  HDL 43  LDLCALC 71  TRIG 62  CHOLHDL 2.9   Thyroid Function Tests: Recent Labs    10/25/20 0434  TSH 0.512   Anemia Panel: No results for input(s): VITAMINB12, FOLATE, FERRITIN, TIBC, IRON, RETICCTPCT in the last 72 hours. Sepsis Labs: No results for input(s): PROCALCITON, LATICACIDVEN in the last 168 hours.  Recent Results (from the past 240 hour(s))  SARS CORONAVIRUS 2 (TAT 6-24 HRS) Nasopharyngeal Nasopharyngeal Swab     Status: None   Collection Time: 10/25/20  2:37 AM   Specimen: Nasopharyngeal Swab  Result Value Ref Range Status   SARS Coronavirus 2 NEGATIVE NEGATIVE Final    Comment: (NOTE) SARS-CoV-2 target nucleic acids are NOT DETECTED.  The SARS-CoV-2 RNA is generally detectable in upper and lower respiratory specimens during the acute phase of infection. Negative results do not preclude SARS-CoV-2 infection, do not rule out co-infections with other pathogens, and should not be used as the sole basis for treatment or other patient management decisions. Negative results must be combined with clinical observations, patient history, and epidemiological information. The  expected result is Negative.  Fact Sheet for Patients: SugarRoll.be  Fact Sheet for Healthcare Providers: https://www.woods-mathews.com/  This test is not yet approved or cleared by the [place] FDA and  has been authorized for detection and/or diagnosis of SARS-CoV-2 by FDA under an Emergency Use Authorization (EUA). This EUA will remain  in effect (meaning this test can be used) for the duration of the COVID-19 declaration under Se ction 564(b)(1) of the Act, 21 U.S.C. section 360bbb-3(b)(1), unless the authorization is terminated or revoked sooner.  Performed at [facility], [place] [address] [place], [place] [address]          Radiology Studies: DG Ribs Unilateral W/Chest Left  Result Date: 10/24/2020 CLINICAL DATA:  Fall and left rib pain. EXAM: LEFT RIBS AND CHEST -  3+ VIEW COMPARISON:  Chest radiograph dated 11/04/2012. FINDINGS: There is diffuse chronic interstitial coarsening. No focal consolidation, pleural effusion, or pneumothorax. The cardiac silhouette is within limits. Osteopenia with degenerative changes of the spine. No acute osseous pathology. No displaced rib fractures. IMPRESSION: 1. No acute cardiopulmonary process. 2. No displaced rib fractures. Electronically Signed   By: Anner Crete M.D.   On: 10/24/2020 22:36   DG Lumbar Spine Complete  Result Date: 10/24/2020 CLINICAL DATA:  Fall, left lower rib pain, low back pain EXAM: LUMBAR SPINE - COMPLETE 4+ VIEW COMPARISON:  None. FINDINGS: Advanced degenerative disc disease changes at L4-5 with disc space loss, vacuum disc and spurring. Degenerative facet disease in the lumbar spine. Normal alignment. No fracture. Diffuse osteopenia. IMPRESSION: No acute bony abnormality. Electronically Signed   By: Rolm Baptise M.D.   On: 10/24/2020 22:34   CT Head Wo Contrast  Result Date: 10/24/2020 CLINICAL DATA:  Head trauma, minor (Age >= 65y) recent stroke affecting L  arm/leg at [facility], now falling EXAM: CT HEAD WITHOUT CONTRAST TECHNIQUE: Contiguous axial images were obtained from the base of the skull through the vertex without intravenous contrast. COMPARISON:  None. FINDINGS: Brain: Patchy low-density throughout the deep white matter, likely chronic small vessel disease. No acute intracranial abnormality. Specifically, no hemorrhage, hydrocephalus, mass lesion, acute infarction, or significant intracranial injury. Vascular: No hyperdense vessel or unexpected calcification. Skull: No acute calvarial abnormality. Sinuses/Orbits: Mucosal thickening throughout the paranasal sinuses, most pronounced in the sphenoid sinuses. Other: None IMPRESSION: Chronic small vessel disease. No acute intracranial abnormality. Chronic sinusitis. Electronically Signed   By: Rolm Baptise M.D.   On: 10/24/2020 22:27   MR ANGIO HEAD WO CONTRAST  Result Date: 10/25/2020 CLINICAL DATA:  Neuro deficit, acute, stroke suspected EXAM: MRI HEAD WITHOUT CONTRAST MRA HEAD WITHOUT CONTRAST TECHNIQUE: Multiplanar, multi-echo pulse sequences of the brain and surrounding structures were acquired without intravenous contrast. Angiographic images of the Circle of Willis were acquired using MRA technique without intravenous contrast. COMPARISON:  No pertinent prior exam. FINDINGS: MRI HEAD FINDINGS Brain: Approximately 1.3 cm focus of peripheral restricted diffusion in the right posterior frontal white matter, most consistent with acute infarct given the patient's risk factors and other findings of chronic microvascular disease. Additional punctate areas of infarct in the more anterior frontal white matter. Moderate to advanced patchy and T2 hyperintensity in the supratentorial white matter. There are numerous small foci of susceptibility artifact throughout the supratentorial brain, predominantly cortical in location and compatible with prior microhemorrhages. No evidence of acute hemorrhage, mass lesion,  hydrocephalus, or extra-axial fluid collection. Vascular: See below. Skull and upper cervical spine: Normal marrow signal. Sinuses/Orbits: Moderate mucosal thickening in the right sphenoid sinus with air-fluid level. Other: No sizable mastoid effusions. MRA HEAD FINDINGS Anterior circulation: Bilateral intracranial ICAs, MCAs, and ACAs are patent without proximal hemodynamically significant stenosis. No aneurysm identified. Posterior circulation: Bilateral intradural vertebral arteries, basilar artery, and posterior cerebral arteries are patent. Moderate mid right and severe distal left P2 PCA stenosis. IMPRESSION: MRI: 1. Approximately 1.3 cm acute infarct in the right frontal white matter with surrounding punctate acute frontal infarcts. 2. Moderate to advanced T2/FLAIR hyperintensities the white matter. 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 3. 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 These hemorrhages are atypical for hypertensive hemorrhages given relative sparing of the basal ganglia, thalami and cerebellum. 4. Moderate  mucosal thickening in the right sphenoid sinus with air-fluid level. MRA: 1. No large vessel occlusion. 2. Motion limited distal evaluation with suspected severe distal left P2 PCA and moderate mid right P2 PCA stenosis. Electronically Signed   By: Margaretha Sheffield M.D.   On: 10/25/2020 12:57   MR BRAIN WO CONTRAST  Result Date: 10/25/2020 CLINICAL DATA:  Neuro deficit, acute, stroke suspected EXAM: MRI HEAD WITHOUT CONTRAST MRA HEAD WITHOUT CONTRAST TECHNIQUE: Multiplanar, multi-echo pulse sequences of the brain and surrounding structures were acquired without intravenous contrast. Angiographic images of the Circle of Willis were acquired using MRA technique without  intravenous contrast. COMPARISON:  No pertinent prior exam. FINDINGS: MRI HEAD FINDINGS Brain: Approximately 1.3 cm focus of peripheral restricted diffusion in the right posterior frontal white matter, most consistent with acute infarct given the patient's risk factors and other findings of chronic microvascular disease. Additional punctate areas of infarct in the more anterior frontal white matter. Moderate to advanced patchy and T2 hyperintensity in the supratentorial white matter. There are numerous small foci of susceptibility artifact throughout the supratentorial brain, predominantly cortical in location and compatible with prior microhemorrhages. No evidence of acute hemorrhage, mass lesion, hydrocephalus, or extra-axial fluid collection. Vascular: See below. Skull and upper cervical spine: Normal marrow signal. Sinuses/Orbits: Moderate mucosal thickening in the right sphenoid sinus with air-fluid level. Other: No sizable mastoid effusions. MRA HEAD FINDINGS Anterior circulation: Bilateral intracranial ICAs, MCAs, and ACAs are patent without proximal hemodynamically significant stenosis. No aneurysm identified. Posterior circulation: Bilateral intradural vertebral arteries, basilar artery, and posterior cerebral arteries are patent. Moderate mid right and severe distal left P2 PCA stenosis. IMPRESSION: MRI: 1. Approximately 1.3 cm acute infarct in the right frontal white matter with surrounding punctate acute frontal infarcts. 2. Moderate to advanced T2/FLAIR hyperintensities the white matter. 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 3. 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 These hemorrhages are atypical for hypertensive hemorrhages given relative sparing of the basal ganglia, thalami  and cerebellum. 4. Moderate mucosal thickening in the right sphenoid sinus with air-fluid level. MRA: 1. No large vessel occlusion. 2. Motion limited distal evaluation with suspected severe distal left P2 PCA and moderate mid right P2 PCA stenosis. Electronically Signed   By: Margaretha Sheffield M.D.   On: 10/25/2020 12:57   ECHOCARDIOGRAM COMPLETE  Result Date: 10/25/2020    ECHOCARDIOGRAM REPORT   Patient Name:   GUSSIE PERALTA Date of Exam: 10/25/2020 Medical Rec #:  [id number]        Height:       64.0 in Accession #:    [id number]       Weight:       136.0 lb Date of Birth:  [birth year]        BSA:          1.661 m Patient Age:    60 years         BP:           145/99 mmHg Patient Gender: F                HR:           82 bpm. Exam Location:  [facility] Procedure: 2D Echo, Cardiac Doppler, Color Doppler and Intracardiac            Opacification Agent Indications:    CVA  History:  Patient has no prior history of Echocardiogram examinations.                 Stroke and COPD; Risk Factors:Diabetes, Current Smoker and                 Hypertension.  Sonographer:    Dustin Flock RDCS Referring Phys: [id number] ASIA B [place]  Sonographer Comments: Technically difficult study due to poor echo windows. Image acquisition challenging due to COPD. IMPRESSIONS  1. Left ventricular ejection fraction, by estimation, is 60 to 65%. The left ventricle has normal function. The left ventricle has no regional wall motion abnormalities. Left ventricular diastolic parameters are indeterminate.  2. Right ventricular systolic function is normal. The right ventricular size is normal. Tricuspid regurgitation signal is inadequate for assessing PA pressure.  3. The mitral valve is normal in structure. No evidence of mitral valve regurgitation. No evidence of mitral stenosis.  4. The aortic valve was not well visualized. Aortic valve regurgitation is not visualized. No aortic stenosis is present.  5. 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 FINDINGS  Left Ventricle: Left ventricular ejection fraction, by estimation, is 60 to 65%. The left ventricle has normal function. The left ventricle has no regional wall motion abnormalities. Definity contrast agent was given IV to delineate the left ventricular  endocardial borders. The left ventricular internal cavity size was normal in size. There is no left ventricular hypertrophy. Left ventricular diastolic parameters are indeterminate. Right Ventricle: The right ventricular size is normal. No increase in right ventricular wall thickness. Right ventricular systolic function is normal. Tricuspid regurgitation signal is inadequate for assessing PA pressure. Left Atrium: Left atrial size was normal in size. Right Atrium: Right atrial size was not well visualized. Pericardium: There is no evidence of pericardial effusion. Mitral Valve: The mitral valve is normal in structure. No evidence of mitral valve regurgitation. No evidence of mitral valve stenosis. Tricuspid Valve: The tricuspid valve is not well visualized. Tricuspid valve regurgitation is not demonstrated. No evidence of tricuspid stenosis. Aortic Valve: The aortic valve was not well visualized. Aortic valve regurgitation is not visualized. No aortic stenosis is present. Aortic valve mean gradient measures 1.7 mmHg. Aortic valve peak gradient measures 2.9 mmHg. Aortic valve area, by VTI measures 2.92 cm. Pulmonic Valve: The pulmonic valve was not well visualized. Pulmonic valve regurgitation is not visualized. No evidence of pulmonic stenosis. Aorta: The aortic root is normal in size and structure. 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  LEFT VENTRICLE PLAX 2D LVIDd:         3.57 cm  Diastology LVIDs:         2.19 cm  LV e' medial:    4.12 cm/s LV PW:         0.98 cm  LV E/e' medial:  11.8 LV IVS:        0.96 cm  LV e'  lateral:   7.36 cm/s LVOT diam:     2.00 cm  LV E/e' lateral: 6.6 LV SV:         50 LV SV Index:   30 LVOT Area:     3.14 cm  RIGHT VENTRICLE RV Basal diam:  3.20 cm TAPSE (M-mode): 2.8 cm LEFT ATRIUM             Index       RIGHT ATRIUM           Index  LA diam:        2.40 cm 1.45 cm/m  RA Area:     10.00 cm LA Vol (A2C):   27.4 ml 16.50 ml/m RA Volume:   20.10 ml  12.10 ml/m LA Vol (A4C):   36.1 ml 21.74 ml/m LA Biplane Vol: 33.4 ml 20.11 ml/m  AORTIC VALVE AV Area (Vmax):    2.99 cm AV Area (Vmean):   2.66 cm AV Area (VTI):     2.92 cm AV Vmax:           84.55 cm/s AV Vmean:          63.507 cm/s AV VTI:            0.170 m AV Peak Grad:      2.9 mmHg AV Mean Grad:      1.7 mmHg LVOT Vmax:         80.50 cm/s LVOT Vmean:        53.800 cm/s LVOT VTI:          0.158 m LVOT/AV VTI ratio: 0.93  AORTA Ao Root diam: 2.60 cm MITRAL VALVE MV Area (PHT): 3.50 cm    SHUNTS MV Decel Time: 217 msec    Systemic VTI:  0.16 m MV E velocity: 48.50 cm/s  Systemic Diam: 2.00 cm MV A velocity: 59.70 cm/s MV E/A ratio:  0.81 Carlyle Dolly MD Electronically signed by Carlyle Dolly MD Signature Date/Time: 10/25/2020/12:18:12 PM    Final         Scheduled Meds:   stroke: mapping our early stages of recovery book   Does not apply Once   amitriptyline  50 mg Oral QHS   amLODipine  10 mg Oral Daily   atorvastatin  40 mg Oral Daily   heparin  5,000 Units Subcutaneous Q8H   insulin aspart  0-5 Units Subcutaneous QHS   insulin aspart  0-9 Units Subcutaneous TID WC   losartan  50 mg Oral BID   Continuous Infusions:   LOS: 1 day    Time spent: 35 minutes.      Elmarie Shiley, MD [facility]   If 7PM-7AM, please contact night-coverage [email or web address]  10/26/2020, 3:13 PM

## 2020-10-26 NOTE — TOC Progression Note (Signed)
Transition of Care [facility]) - Progression Note    Patient Details  Name: Cheyenne [name] MRN: [id number] Date of Birth: 1950/11/19  [facility]) CM/SW Contact  Natasha Bence, LCSW Phone Number: 10/26/2020, 2:22 PM  Clinical Narrative:    CSW contacted [facility] to inquire about referral. Admissions agreeable to review patient. CSW contacted [facility] admissions to follow up. CSW LVM. Patient's daughter reported that she also spoke with admissions and stated that patient's husband is currently at [facility]. [facility] admissions also informed patient's daughter that they are reviewing patient for admission. TOC to follow.        Expected Discharge Plan and Services                                                 Social Determinants of Health (SDOH) Interventions    Readmission Risk Interventions No flowsheet data found.

## 2020-10-27 DIAGNOSIS — R531 Weakness: Secondary | ICD-10-CM | POA: Diagnosis not present

## 2020-10-27 LAB — BASIC METABOLIC PANEL
Anion gap: 12 (ref 5–15)
BUN: 14 mg/dL (ref 8–23)
CO2: 25 mmol/L (ref 22–32)
Calcium: 9.3 mg/dL (ref 8.9–10.3)
Chloride: 101 mmol/L (ref 98–111)
Creatinine, Ser: 0.81 mg/dL (ref 0.44–1.00)
GFR, Estimated: >60 mL/min (ref >60)
Glucose, Bld: 126 mg/dL — ABNORMAL HIGH (ref 70–99)
Potassium: 3.7 mmol/L (ref 3.5–5.1)
Sodium: 138 mmol/L (ref 135–145)

## 2020-10-27 LAB — GLUCOSE, CAPILLARY
Glucose-Capillary: 133 mg/dL — ABNORMAL HIGH (ref 70–99)
Glucose-Capillary: 226 mg/dL — ABNORMAL HIGH (ref 70–99)
Glucose-Capillary: 213 mg/dL — ABNORMAL HIGH (ref 70–99)
Glucose-Capillary: 259 mg/dL — ABNORMAL HIGH (ref 70–99)

## 2020-10-27 LAB — CBC
HCT: 42.9 % (ref 36.0–46.0)
Hemoglobin: 14.2 g/dL (ref 12.0–15.0)
MCH: 29.1 pg (ref 26.0–34.0)
MCHC: 33.1 g/dL (ref 30.0–36.0)
MCV: 87.9 fL (ref 80.0–100.0)
Platelets: 220 10*3/uL (ref 150–400)
RBC: 4.88 MIL/uL (ref 3.87–5.11)
RDW: 13.8 % (ref 11.5–15.5)
WBC: 6.2 10*3/uL (ref 4.0–10.5)
nRBC: 0 % (ref 0.0–0.2)

## 2020-10-27 MED ORDER — CLOPIDOGREL BISULFATE 75 MG PO TABS
75.0000 mg | ORAL_TABLET | Freq: Every day | ORAL | Status: DC
  Administered 2020-10-29 – 2020-10-31 (×3): 75 mg via ORAL
  Filled 2020-10-27 (×4): qty 1

## 2020-10-27 NOTE — Progress Notes (Signed)
Pts PASRR is [id number].

## 2020-10-27 NOTE — Progress Notes (Addendum)
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  Work-up in the ED was reassuring including radiographs to rule out fracture from falls and CT head without acute stroke of hemorrhage.  Her mentation appears somewhat confused which is not her baseline, hospitalist were consulted and the patient was admitted early for further evaluation.  Patient has had wide variation in blood glucose including symptomatic hypoglycemia down to 35.   MRI was positive for acute stroke.   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  MRI; 1.3 cm acute infarct in the right frontal white matter with surrounding punctate acute frontal infarcts. MRA; No large Vessel Occlusion.  On aspirin. Statins.  Awaiting Neurology evaluation. Added Dr Lily Lovings to treatment team. Left message.  LDL 71.  On aspirin prior to admission.  ECHO; no source of embolism.   2-DM type 2.  Hold metformin and glimepiride.  Had Hypoglycemic episodes.  Continue with SSI.  A1c at 9.  Due to low Blood sugar patient was getting lower dose insulin Novolin at home, per daughter.  She will need lower insulin doses at discharge.   History of CVA;  Continue with statins, aspirin.    HTN: Continue with Norvasc and losartan.  Estimated body mass index is 23.5 kg/m as calculated from the following:   Height as of this  encounter: '5\' 4"'$  (1.626 m).   Weight as of this encounter: 62.1 kg.   DVT prophylaxis: Heparin Code Status: Full code Family Communication: grandaughter over phone 8/24.  Disposition Plan:  Status is: Inpatient  Remains inpatient appropriate because:IV treatments appropriate due to intensity of illness or inability to take PO  Dispo: The patient is from: Home              Anticipated d/c is to: SNF              Patient currently is not medically stable to d/c.   Difficult to place patient No        Consultants:  Neurology.   Procedures:  ECHO  Antimicrobials:    Subjective: She is alert, oriented to person, denies pain, following command.   Objective: Vitals:   10/26/20 2224 10/27/20 0500 10/27/20 0517 10/27/20 1155  BP: (!) 152/76  (!) 142/76 117/62  Pulse: 87  87 81  Resp: '20  19 18  '$ Temp: 98.4 F (36.9 C)  98.2 F (36.8 C) 98.1 F (36.7 C)  TempSrc: Oral  Oral Oral  SpO2: 94%  95% 94%  Weight:  62.1 kg    Height:        Intake/Output Summary (Last 24 hours) at 10/27/2020 1348 Last data filed at 10/26/2020 1443 Gross per 24 hour  Intake 150 ml  Output --  Net 150 ml    Filed Weights   10/24/20 2043 10/26/20 0500 10/27/20 0500  Weight: 61.7 kg 62 kg 62.1 kg    Examination:  General exam: NAD  Respiratory system: CTA Cardiovascular system: S 1, S 2 RRR Gastrointestinal system: BS present, soft, nt, nd Central nervous system: Alert, oriented to person, left side drift , weakness,  Extremities: No edema    Data Reviewed: I have personally reviewed following labs and imaging studies  CBC: Recent Labs  Lab 10/24/20 2058 10/25/20 0432 10/27/20 0558  WBC 9.4 10.1 6.2  NEUTROABS  --  7.6  --   HGB 16.4* 14.9 14.2  HCT 50.5* 45.4 42.9  MCV 88.9 89.2 87.9  PLT 276 257 [id number]    Basic Metabolic Panel: Recent Labs  Lab 10/24/20 2058 10/25/20 0432 10/27/20 0558  NA 137 138 138  K 4.2 3.9 3.7  CL 105 108 101  CO2 25 21* 25  GLUCOSE 93  43* 126*  BUN '15 16 14  '$ CREATININE 0.90 0.74 0.81  CALCIUM 9.5 8.7* 9.3  MG  --  2.1  --     GFR: Estimated Creatinine Clearance: 56.6 mL/min (by C-G formula based on SCr of 0.81 mg/dL). Liver Function Tests: Recent Labs  Lab 10/24/20 2058 10/25/20 0432  AST 16 15  ALT 13 12  ALKPHOS 86 78  BILITOT 0.7 0.7  PROT 8.1 7.2  ALBUMIN 4.0 3.6    No results for input(s): LIPASE, AMYLASE in the last 168 hours. Recent Labs  Lab 10/25/20 1024  AMMONIA 11    Coagulation Profile: No results for input(s): INR, PROTIME in the last 168 hours. Cardiac Enzymes: No results for input(s): CKTOTAL, CKMB, CKMBINDEX, TROPONINI in the last 168 hours. BNP (last 3 results) No results for input(s): PROBNP in the last 8760 hours. HbA1C: Recent Labs    10/25/20 0432  HGBA1C 9.1*  9.0*    CBG: Recent Labs  Lab 10/26/20 1124 10/26/20 1626 10/26/20 2216 10/27/20 0837 10/27/20 1101  GLUCAP 168* 123* 152* 133* 226*    Lipid Profile: Recent Labs    10/25/20 0432  CHOL 126  HDL 43  LDLCALC 71  TRIG 62  CHOLHDL 2.9    Thyroid Function Tests: Recent Labs    10/25/20 0434  TSH 0.512    Anemia Panel: No results for input(s): VITAMINB12, FOLATE, FERRITIN, TIBC, IRON, RETICCTPCT in the last 72 hours. Sepsis Labs: No results for input(s): PROCALCITON, LATICACIDVEN in the last 168 hours.  Recent Results (from the past 240 hour(s))  SARS CORONAVIRUS 2 (TAT 6-24 HRS) Nasopharyngeal Nasopharyngeal Swab     Status: None   Collection Time: 10/25/20  2:37 AM   Specimen: Nasopharyngeal Swab  Result Value Ref Range Status   SARS Coronavirus 2 NEGATIVE NEGATIVE Final    Comment: (NOTE) SARS-CoV-2 target nucleic acids are NOT DETECTED.  The SARS-CoV-2 RNA is generally detectable in upper and lower respiratory specimens during the acute phase of infection. Negative results do not preclude SARS-CoV-2 infection, do not rule out co-infections with other pathogens, and should not be  used as the sole basis for treatment or other patient management decisions. Negative results must be combined with clinical observations, patient history, and epidemiological information. The expected result is Negative.  Fact Sheet for Patients: SugarRoll.be  Fact Sheet for Healthcare Providers: https://www.woods-mathews.com/  This test is not yet approved or cleared by the [place] FDA and  has been authorized for detection and/or diagnosis of SARS-CoV-2 by FDA under an Emergency Use Authorization (EUA). This EUA will remain  in effect (meaning this test can be used) for the duration of the COVID-19 declaration under Se ction 564(b)(1) of the Act, 21  U.S.C. section 360bbb-3(b)(1), unless the authorization is terminated or revoked sooner.  Performed at [facility], [place] [address] [place], [place] [address]           Radiology Studies: No results found.      Scheduled Meds:   stroke: mapping our early stages of recovery book   Does not apply Once   amLODipine  10 mg Oral Daily   aspirin EC  81 mg Oral Daily   atorvastatin  40 mg Oral Daily   escitalopram  10 mg Oral Daily   heparin  5,000 Units Subcutaneous Q8H   insulin aspart  0-5 Units Subcutaneous QHS   insulin aspart  0-9 Units Subcutaneous TID WC   loratadine  10 mg Oral Daily   losartan  50 mg Oral BID   rOPINIRole  0.25 mg Oral QHS   Continuous Infusions:   LOS: 2 days    Time spent: 35 minutes.      Elmarie Shiley, MD [facility]   If 7PM-7AM, please contact night-coverage [email or web address]  10/27/2020, 1:48 PM

## 2020-10-27 NOTE — [place] FL2 (Signed)
[place] MEDICAID FL2 LEVEL OF CARE SCREENING TOOL     IDENTIFICATION  Patient Name: Cheyenne [name] Birthdate: 1950-06-05 Sex: female Admission Date (Current Location): 10/24/2020  [facility] and [place] Number:  [employer] and Address:  [place] [address], [place]      Provider Number: [id number]  Attending Physician Name and Address:  Elmarie Shiley, MD  Relative Name and Phone Number:  Lanna Poche (Granddaughter)   [phone]    Current Level of Care: Hospital Recommended Level of Care: [place] Prior Approval Number:    Date Approved/Denied:   PASRR Number: Pending  Discharge Plan: SNF    Current Diagnoses: Patient Active Problem List   Diagnosis Date Noted   Generalized weakness 10/25/2020   Hypertension 10/25/2020   Type 2 diabetes mellitus with hyperlipidemia ([place]) 10/25/2020   Psychiatric problem 10/25/2020    Orientation RESPIRATION BLADDER Height & Weight     Self, Time, Situation, Place  Normal Continent Weight: 136 lb 14.5 oz (62.1 kg) Height:  '5\' 4"'$  (162.6 cm)  BEHAVIORAL SYMPTOMS/MOOD NEUROLOGICAL BOWEL NUTRITION STATUS      Continent Diet (Diet heart healthy/carb modified Room service appropriate? Yes; Fluid consistency: Thin)  AMBULATORY STATUS COMMUNICATION OF NEEDS Skin   Extensive Assist Non-Verbally Normal                       Personal Care Assistance Level of Assistance  Bathing, Feeding, Dressing Bathing Assistance: Maximum assistance Feeding assistance: Independent Dressing Assistance: Maximum assistance     Functional Limitations Info  Sight, Hearing, Speech Sight Info: Adequate Hearing Info: Adequate Speech Info: Adequate    SPECIAL CARE FACTORS FREQUENCY  PT (By licensed PT)     PT Frequency: 5x              Contractures Contractures Info: Not present    Additional Factors Info  Code Status, Allergies, Insulin Sliding Scale Code Status  Info: Full Allergies Info: Gabapentin, Pregabalin, Codeine   Insulin Sliding Scale Info: CBG 70 - 120: 0 units   CBG 121 - 150: 1 unit   CBG 151 - 200: 2 units   CBG 201 - 250: 3 units   CBG 251 - 300: 5 units   CBG 301 - 350: 7 units   CBG 351 - 400 9 units   CBG > 400 call MD and obtain STAT lab verification       Current Medications (10/27/2020):  This is the current hospital active medication list Current Facility-Administered Medications  Medication Dose Route Frequency Provider Last Rate Last Admin    stroke: mapping our early stages of recovery book   Does not apply Once Zierle-Ghosh, Asia B, DO       acetaminophen (TYLENOL) tablet 650 mg  650 mg Oral Q6H PRN Zierle-Ghosh, Asia B, DO   650 mg at 10/25/20 2105   Or   acetaminophen (TYLENOL) suppository 650 mg  650 mg Rectal Q6H PRN Zierle-Ghosh, Asia B, DO       albuterol (VENTOLIN HFA) 108 (90 Base) MCG/ACT inhaler 1-2 puff  1-2 puff Inhalation Q6H PRN Zierle-Ghosh, Asia B, DO       amLODipine (NORVASC) tablet 10 mg  10 mg Oral Daily Zierle-Ghosh, Asia B, DO   10 mg at 10/27/20 0907   aspirin EC tablet 81 mg  81 mg Oral Daily Regalado, Belkys A, MD   81 mg at 10/27/20 0908   atorvastatin (LIPITOR) tablet  40 mg  40 mg Oral Daily Zierle-Ghosh, Asia B, DO   40 mg at 10/27/20 [id number]   clonazePAM (KLONOPIN) tablet 0.5 mg  0.5 mg Oral BID PRN Regalado, Belkys A, MD   0.5 mg at 10/26/20 2218   escitalopram (LEXAPRO) tablet 10 mg  10 mg Oral Daily Regalado, Belkys A, MD   10 mg at 10/27/20 0907   heparin injection 5,000 Units  5,000 Units Subcutaneous Q8H Zierle-Ghosh, Asia B, DO   5,000 Units at 10/27/20 1307   insulin aspart (novoLOG) injection 0-5 Units  0-5 Units Subcutaneous QHS Patrecia Pour, MD   2 Units at 10/25/20 2106   insulin aspart (novoLOG) injection 0-9 Units  0-9 Units Subcutaneous TID WC Patrecia Pour, MD   3 Units at 10/27/20 1307   loratadine (CLARITIN) tablet 10 mg  10 mg Oral Daily Regalado, Belkys A, MD   10 mg at 10/27/20  0908   losartan (COZAAR) tablet 50 mg  50 mg Oral BID Zierle-Ghosh, Asia B, DO   50 mg at 10/27/20 0907   ondansetron (ZOFRAN) tablet 4 mg  4 mg Oral Q6H PRN Zierle-Ghosh, Asia B, DO       Or   ondansetron (ZOFRAN) injection 4 mg  4 mg Intravenous Q6H PRN Zierle-Ghosh, Asia B, DO       rOPINIRole (REQUIP) tablet 0.25 mg  0.25 mg Oral QHS Regalado, Belkys A, MD   0.25 mg at 10/26/20 2218     Discharge Medications: Please see discharge summary for a list of discharge medications.  Relevant Imaging Results:  Relevant Lab Results:   Additional Information Pt SSN: [id number]  Natasha Bence, LCSW

## 2020-10-27 NOTE — Progress Notes (Signed)
Occupational Therapy Treatment Patient Details Name: Cheyenne [name] MRN: [id number] DOB: [birth year] Today's Date: 10/27/2020    History of present illness Cheyenne [name]  is a 70 y.o. female, with history of diabetes mellitus type 2, and stroke 6 weeks ago, presents the ED with a chief complaint of generalized weakness.  Patient has had 4 or 5 falls in the last 5 days.  She reports that today she landed on her face.  She did not have loss of consciousness.  She reports that she tripped and fell over her house slippers.  She did not have any chest pain, palpitations, nausea, presyncopal feeling, or diaphoresis prior to her fall.  She denies any new asymmetrical weakness.  She does have residual left arm and leg weakness from the stroke 6 weeks ago.  She has not had any trouble with vision, speaking, swallowing.  She does not have any change in her hearing either.  Patient reports that she has been excessively fatigued.  Initially the ED attempted to discharge this patient but the daughter feels strongly that we are missing something as she reports that she is worse than when she was admitted to the hospital 6 weeks ago.  That admission was at [facility].  At this time patient is not complaining of any pain.  Earlier in the shift she was complaining of low back pain.  Daughter reported that she is a little more confused than normal, but patient is alert and oriented for me.  Patient has no other complaints at this time.   OT comments  Pt much more alert this date at start of session. Pt oriented to year but not month or place. Pt required min to mod A for supine to sit bed mobility but min A for sit to supine. Pt demonstrates weak L UE with assist needed for placement on RW. Pt required Min A for sit to stand and ambulation to sink where pt completed grooming tasks with Min A as well due to balance deficits. Pt using R UE to wash face with wash cloth. Pt appeared fatigued end of session with eyes closed  in bed. Pt left in bed with bed alarm set. Pt will benefit from continued OT in the hospital and recommended venue below to increase strength, balance, and endurance for safe ADL's.     Follow Up Recommendations  SNF    Equipment Recommendations  None recommended by OT          Precautions / Restrictions Precautions Precautions: Fall Restrictions Weight Bearing Restrictions: No       Mobility Bed Mobility Overal bed mobility: Needs Assistance Bed Mobility: Sit to Supine;Supine to Sit     Supine to sit: Min assist;Mod assist Sit to supine: Min assist   General bed mobility comments: Slow labored movement.    Transfers Overall transfer level: Needs assistance Equipment used: Rolling walker (2 wheeled) Transfers: Sit to/from [facility] Sit to Stand: Min assist Stand pivot transfers: Min assist       General transfer comment: Pt required tactile cuing and min A to redirect RW during ambulation. Pt also required assist due to mild posterior lean in standing.    Balance Overall balance assessment: Needs assistance Sitting-balance support: Feet supported;No upper extremity supported Sitting balance-Leahy Scale: Fair Sitting balance - Comments: seated at EOB Postural control: Left lateral lean Standing balance support: During functional activity;Bilateral upper extremity supported Standing balance-Leahy Scale: Fair Standing balance comment: using RW with additional assist from therapist.  ADL either performed or assessed with clinical judgement   ADL Overall ADL's : Needs assistance/impaired     Grooming: Minimal assistance;Standing;Cueing for sequencing;Wash/dry face;Wash/dry hands Grooming Details (indicate cue type and reason): Pt able to wash hands and face standing at sink with RW and Min A.                                                       Cognition Arousal/Alertness:  Awake/alert Behavior During Therapy: WFL for tasks assessed/performed Overall Cognitive Status: No family/caregiver present to determine baseline cognitive functioning                                 General Comments: Pt not oriented to place or month but did know the year.                          Pertinent Vitals/ Pain       Pain Assessment: No/denies pain                                                          Frequency  Min 2X/week        Progress Toward Goals  OT Goals(current goals can now be found in the care plan section)  Progress towards OT goals: Progressing toward goals  Acute Rehab OT Goals Patient Stated Goal: return home Time For Goal Achievement: 11/08/20 Potential to Achieve Goals: Fair ADL Goals Pt Will Perform Grooming: standing;with min guard assist;with adaptive equipment Pt Will Perform Upper Body Dressing: with supervision;sitting;with adaptive equipment Pt Will Perform Lower Body Dressing: with min assist;sitting/lateral leans;with adaptive equipment;sit to/from stand Pt Will Transfer to Toilet: with supervision;with min guard assist;stand pivot transfer Pt/caregiver will Perform Home Exercise Program: Increased ROM;Both right and left upper extremity;With minimal assist  Plan Discharge plan remains appropriate                                    End of Session Equipment Utilized During Treatment: Rolling walker  OT Visit Diagnosis: Unsteadiness on feet (R26.81);Repeated falls (R29.6);History of falling (Z91.81);Muscle weakness (generalized) (M62.81);Other symptoms and signs involving cognitive function;Hemiplegia and hemiparesis Hemiplegia - Right/Left: Left Hemiplegia - dominant/non-dominant: Non-Dominant   Activity Tolerance Patient tolerated treatment well   Patient Left in bed;with call bell/phone within reach;with bed alarm set             Time: [id number] OT Time  Calculation (min): 24 min  Charges: OT General Charges $OT Visit: 1 Visit OT Treatments $Self Care/Home Management : 23-37 mins  Brecken Walth OT, MOT   Larey Seat 10/27/2020, 11:30 AM

## 2020-10-27 NOTE — Plan of Care (Signed)
  Problem: Education: Goal: Knowledge of General Education information will improve Description Including pain rating scale, medication(s)/side effects and non-pharmacologic comfort measures Outcome: Progressing   

## 2020-10-27 NOTE — Consult Note (Addendum)
Olean A. Merlene Laughter, MD     [email or web address]          Cheyenne [name] is an 70 y.o. female.   ASSESSMENT/PLAN: ACUTE GAIT IMPAIRMENT AND THE CONFUSION;  ETIOLOGY IS AT LEAST DUE TO ACUTE ISCHEMIC STROKE INVOLVING THE RIGHT FRONTAL AREA. THERE IS SOME SUGGESTION OF [name] SMALL STROKE ON THE LEFT SIDE.  Etiology is unclear but possible cardioembolic phenomena this suspected. Carotid disease however has not being ruled out and therefore carotid duplex Doppler will be obtained. Patient has been on aspirin and this has been continued. Plavix will be added but only for 1 week. The patient's imaging shows evidence of a cerebral amyloid angiopathy which poses a significant risk of intracranial hemorrhage. Consequently, only aspirin will be recommended long-term. A 30 day event monitor is also recommended although given the multiple microhemorrhage seen on imaging, it is unlikely that she will be a candidate for long-term anticoagulation. I think it is still important for [name] to find that if she should she has paroxysmal atrial fibrillation however. Cerebral amyloid angiopathy: This increases the long-term risk of intracranial hemorrhage and dementia.     The patient presents with the acute onset of gait impairment. She is also noted to have significant cognitive impairment and acute mental status changes. The workup has been significant for ischemic stroke seen on MRI involving the right frontal region. There is also suggestion of a tiny stroke involving the posterior left frontal region.  She is currently disoriented and cannot provide adequate history but she does not complain of any issues at this time. The review systems is limited because of the confusion but appears unrevealing.   GENERAL:  She is resting in bed and cooperates with evaluation.  HEENT:  Neck is supple no trauma noted.  ABDOMEN: soft  EXTREMITIES: No edema   BACK: Normal  SKIN: Normal by inspection.     MENTAL STATUS:  She is awake and alert and the does follow commands briskly. She thinks she is at home and often makes nonsensical speech and the her daughter playing outside with her granddaughter. She is is oriented to the month but not the year.  CRANIAL NERVES: Pupils are equal, round and reactive to light and accomodation; extra ocular movements are full, there is no significant nystagmus; visual fields are full; upper and lower facial muscles are normal in strength and symmetric, there is no flattening of the nasolabial folds; tongue is midline; uvula is midline; shoulder elevation is normal.  MOTOR: Normal tone, bulk and strength; There is a significant drift of the left upper extremity. There is no drift of the other extremities.  COORDINATION: Left finger to nose is normal, right finger to nose is normal, No rest tremor; no intention tremor; no postural tremor; no bradykinesia.  REFLEXES: Deep tendon reflexes are symmetrical and normal.   SENSATION: Normal to pain.      Blood pressure 117/62, pulse 81, temperature 98.1 F (36.7 C), temperature source Oral, resp. rate 18, height '5\' 4"'$  (1.626 m), weight 62.1 kg, SpO2 94 %.  Past Medical History:  Diagnosis Date   Diabetes mellitus without complication ([place])    Stroke ([place])    left sided arm weakness    Past Surgical History:  Procedure Laterality Date   BACK SURGERY      History reviewed. No pertinent family history.  Social History:  reports that she has been smoking cigarettes. She has been smoking an average of .5 packs per day. She  has never used smokeless tobacco. She reports that she does not currently use alcohol. She reports that she does not use drugs.  Allergies:  Allergies  Allergen Reactions   Gabapentin     Bad dreams   Pregabalin     Drowsiness. Mind not clear.   Codeine Itching    Medications: Prior to Admission medications   Medication Sig Start Date End Date Taking? Authorizing Provider   alendronate (FOSAMAX) 70 MG tablet Take 70 mg by mouth once a week. Wednesday 10/03/20  Yes [provider]  aspirin 81 MG EC tablet Take 1 tablet by mouth daily. 11/02/04  Yes [provider]  atorvastatin (LIPITOR) 40 MG tablet Take 80 mg by mouth daily. 11/26/17  Yes [provider]  cholecalciferol (VITAMIN D3) 25 MCG (1000 UNIT) tablet Take 1,000 Units by mouth daily.   Yes [provider]  clonazePAM (KLONOPIN) 0.5 MG tablet Take 0.5 mg by mouth 2 (two) times daily as needed. 10/04/20  Yes [provider]  escitalopram (LEXAPRO) 10 MG tablet Take 10 mg by mouth daily.   Yes [provider]  glimepiride (AMARYL) 1 MG tablet Take 1 mg by mouth daily with breakfast. 11/05/17  Yes [provider]  loratadine (CLARITIN) 10 MG tablet Take 10 mg by mouth daily.   Yes [provider]  losartan (COZAAR) 50 MG tablet Take 50 mg by mouth 2 (two) times daily. 11/26/17  Yes [provider]  melatonin 5 MG TABS Take 10 mg by mouth at bedtime.   Yes [provider]  NOVOLIN N 100 UNIT/ML injection Inject 20-45 Units into the skin 2 (two) times daily. 45units in am and 20 units at night 10/07/20  Yes [provider]  potassium chloride SA (KLOR-CON) 20 MEQ tablet Take 40 mEq by mouth every morning. 09/20/20  Yes [provider]  rOPINIRole (REQUIP) 0.25 MG tablet Take 0.25 mg by mouth at bedtime. 10/06/20  Yes [provider]  albuterol (VENTOLIN HFA) 108 (90 Base) MCG/ACT inhaler Inhale 1-2 puffs into the lungs every 6 (six) hours as needed for wheezing or shortness of breath. 01/18/19   Gertie Baron, NP  dicyclomine (BENTYL) 20 MG tablet Take 1 tablet (20 mg total) by mouth 3 (three) times daily as needed for spasms. Patient not taking: No sig reported 11/27/17   Earleen Newport, MD  metroNIDAZOLE (FLAGYL) 500 MG tablet Take 1 tablet (500 mg total) by mouth 3 (three) times daily. Patient not  taking: Reported on 10/25/2020 11/27/17   Earleen Newport, MD  ondansetron (ZOFRAN ODT) 4 MG disintegrating tablet Take 1 tablet (4 mg total) by mouth every 8 (eight) hours as needed for nausea or vomiting. Patient not taking: Reported on 10/25/2020 11/27/17   Earleen Newport, MD    Scheduled Meds:   stroke: mapping our early stages of recovery book   Does not apply Once   amLODipine  10 mg Oral Daily   aspirin EC  81 mg Oral Daily   atorvastatin  40 mg Oral Daily   escitalopram  10 mg Oral Daily   heparin  5,000 Units Subcutaneous Q8H   insulin aspart  0-5 Units Subcutaneous QHS   insulin aspart  0-9 Units Subcutaneous TID WC   loratadine  10 mg Oral Daily   losartan  50 mg Oral BID   rOPINIRole  0.25 mg Oral QHS   Continuous Infusions: PRN Meds:.acetaminophen **OR** acetaminophen, albuterol, clonazePAM, ondansetron **OR** ondansetron (ZOFRAN) IV  Results for orders placed or performed during the hospital encounter of 10/24/20 (from the past 48 hour(s))  Glucose, capillary     Status: Abnormal   Collection Time: 10/25/20  9:07 PM  Result Value Ref Range   Glucose-Capillary 245 (H) 70 - 99 mg/dL    Comment: Glucose reference range applies only to samples taken after fasting for at least 8 hours.  Glucose, capillary     Status: Abnormal   Collection Time: 10/26/20  7:45 AM  Result Value Ref Range   Glucose-Capillary 161 (H) 70 - 99 mg/dL    Comment: Glucose reference range applies only to samples taken after fasting for at least 8 hours.  Glucose, capillary     Status: Abnormal   Collection Time: 10/26/20 11:24 AM  Result Value Ref Range   Glucose-Capillary 168 (H) 70 - 99 mg/dL    Comment: Glucose reference range applies only to samples taken after fasting for at least 8 hours.  Glucose, capillary     Status: Abnormal   Collection Time: 10/26/20  4:26 PM  Result Value Ref Range   Glucose-Capillary 123 (H) 70 - 99 mg/dL    Comment: Glucose reference range applies  only to samples taken after fasting for at least 8 hours.   Comment 1 Notify RN    Comment 2 Document in Chart   Glucose, capillary     Status: Abnormal   Collection Time: 10/26/20 10:16 PM  Result Value Ref Range   Glucose-Capillary 152 (H) 70 - 99 mg/dL    Comment: Glucose reference range applies only to samples taken after fasting for at least 8 hours.  CBC     Status: None   Collection Time: 10/27/20  5:58 AM  Result Value Ref Range   WBC 6.2 4.0 - 10.5 K/uL   RBC 4.88 3.87 - 5.11 MIL/uL   Hemoglobin 14.2 12.0 - 15.0 g/dL   HCT 42.9 36.0 - 46.0 %   MCV 87.9 80.0 - 100.0 fL   MCH 29.1 26.0 - 34.0 pg   MCHC 33.1 30.0 - 36.0 g/dL   RDW 13.8 11.5 - 15.5 %   Platelets 220 150 - 400 K/uL   nRBC 0.0 0.0 - 0.2 %    Comment: Performed at [facility], [address] [place], [place] [id number]  Basic metabolic panel     Status: Abnormal   Collection Time: 10/27/20  5:58 AM  Result Value Ref Range   Sodium 138 135 - 145 mmol/L   Potassium 3.7 3.5 - 5.1 mmol/L   Chloride 101 98 - 111 mmol/L   CO2 25 22 - 32 mmol/L   Glucose, Bld 126 (H) 70 - 99 mg/dL    Comment: Glucose reference range applies only to samples taken after fasting for at least 8 hours.   BUN 14 8 - 23 mg/dL   Creatinine, Ser 0.81 0.44 - 1.00 mg/dL   Calcium 9.3 8.9 - 10.3 mg/dL   GFR, Estimated >60 >60 mL/min    Comment: (NOTE) Calculated using the CKD-EPI Creatinine Equation (2021)    Anion gap 12 5 - 15    Comment: Performed at [facility], [address] [place],  [address]  Glucose, capillary     Status: Abnormal   Collection Time: 10/27/20  8:37 AM  Result Value Ref Range   Glucose-Capillary 133 (H) 70 - 99 mg/dL    Comment: Glucose reference range applies only to samples taken after fasting for at least 8 hours.  Glucose, capillary     Status: Abnormal   Collection Time: 10/27/20 11:01 AM  Result Value Ref Range   Glucose-Capillary 226 (H) 70 - 99 mg/dL    Comment: Glucose reference range applies  only to samples taken after fasting for at least 8 hours.  Glucose, capillary     Status: Abnormal   Collection Time: 10/27/20  4:09 PM  Result Value Ref Range   Glucose-Capillary 213 (H) 70 - 99 mg/dL    Comment: Glucose reference range applies only to samples taken after fasting for at least 8 hours.    Studies/Results:  BRAIN MRI MRA IMPRESSION: MRI:   1. Approximately 1.3 cm acute infarct in the right frontal white matter with surrounding punctate acute frontal infarcts. 2. Moderate to advanced T2/FLAIR hyperintensities the white matter. 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 3. 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 These hemorrhages are atypical for hypertensive hemorrhages given relative sparing of the basal ganglia, thalami and cerebellum. 4. Moderate mucosal thickening in the right sphenoid sinus with air-fluid level.   MRA:   1. No large vessel occlusion. 2. Motion limited distal evaluation with suspected severe distal left P2 PCA and moderate mid right P2 PCA stenosis.     THE BRAIN MRI IS REVIEWED IN PERSON AND SHOWS DEEP RIGHT FRONTAL INCREASED SIGNAL ON DWI. THE LARGEST 1 IN THE MOST POSTERIOR HAS A CENTRAL AREA OF ENCEPHALOMALACIA WITH SURROUNDING AREA OF INCREASED SIGNAL. THE DISTRIBUTION IS CONSISTENT WITH A WATERSHED INFARCT IN APPARENT MEDIAN DISTRIBUTION. THERE IS ALSO POSSIBLE LEFT FRONTAL SIGNAL POSTERIOR TO THE LATERAL VENTRICLE. THERE WAS SEVERE CONFLUENT LEUKOENCEPHALOPATHY. THERE ARE NUMEROUS MICROHEMORRHAGE INVOLVING THE SUPRA TENTORIUM. MULTIPLE REMOTE INFARCTION NOTED IN THE CENTRUM SEMIOVALE, BASAL GANGLIA AND THALAMIC AREAS BILATERALLY.    TTE 1. Left ventricular ejection fraction, by estimation, is 60 to 65%. The  left  ventricle has normal function. The left ventricle has no regional  wall motion abnormalities. Left ventricular diastolic parameters are  indeterminate.   2. Right ventricular systolic function is normal. The right ventricular  size is normal. Tricuspid regurgitation signal is inadequate for assessing  PA pressure.   3. The mitral valve is normal in structure. No evidence of mitral valve  regurgitation. No evidence of mitral stenosis.   4. The aortic valve was not well visualized. Aortic valve regurgitation  is not visualized. No aortic stenosis is present.   5. 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      Cheyenne [name] A. Merlene Laughter, M.D.  Diplomate, Tax adviser of Psychiatry and Neurology ( Neurology). 10/27/2020, 6:16 PM

## 2020-10-28 ENCOUNTER — Inpatient Hospital Stay (HOSPITAL_COMMUNITY): Payer: Medicare HMO

## 2020-10-28 DIAGNOSIS — I639 Cerebral infarction, unspecified: Secondary | ICD-10-CM

## 2020-10-28 LAB — GLUCOSE, CAPILLARY
Glucose-Capillary: 177 mg/dL — ABNORMAL HIGH (ref 70–99)
Glucose-Capillary: 262 mg/dL — ABNORMAL HIGH (ref 70–99)
Glucose-Capillary: 143 mg/dL — ABNORMAL HIGH (ref 70–99)
Glucose-Capillary: 402 mg/dL — ABNORMAL HIGH (ref 70–99)

## 2020-10-28 IMAGING — US US CAROTID DUPLEX BILAT
1 series · 13 of 24 positions shown · non-contrast
Comparison: None.

CLINICAL DATA: Previous stroke, recent falls, left-sided weakness

EXAM:
BILATERAL CAROTID DUPLEX ULTRASOUND
TECHNIQUE: Gray scale imaging, color Doppler and duplex ultrasound were
performed of bilateral carotid and vertebral arteries in the neck.

[Series 1: us carotid bilateral · 13 of 68 slices shown]
[im 1/68]
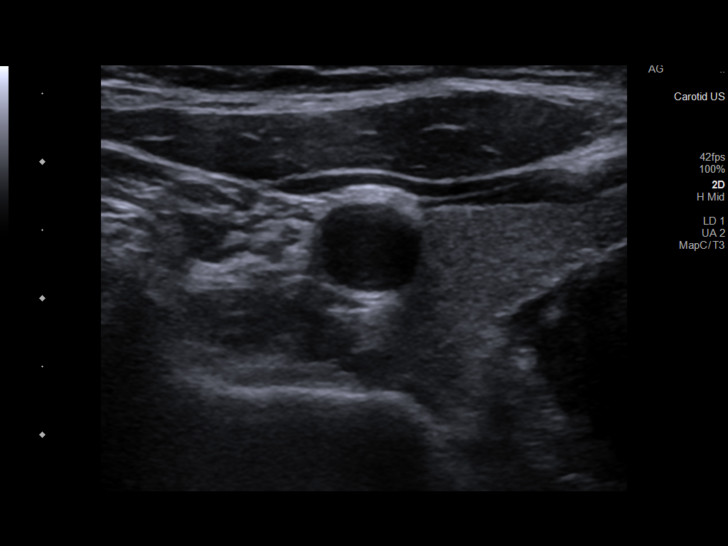
[im 6/68]
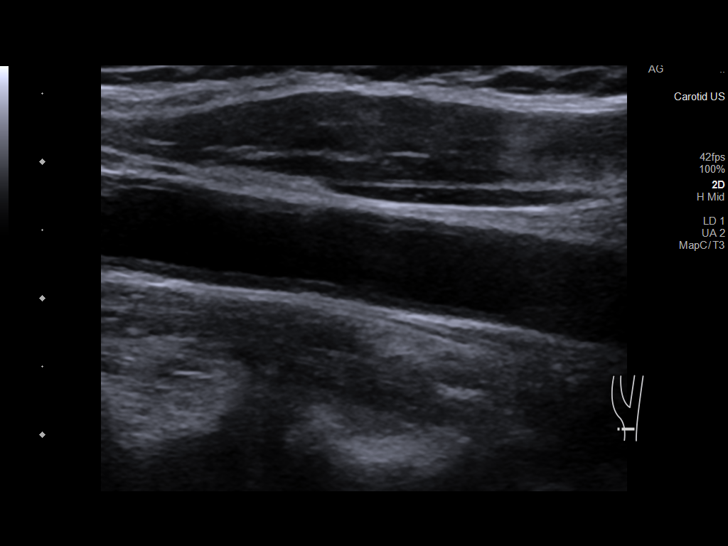
[im 12/68]
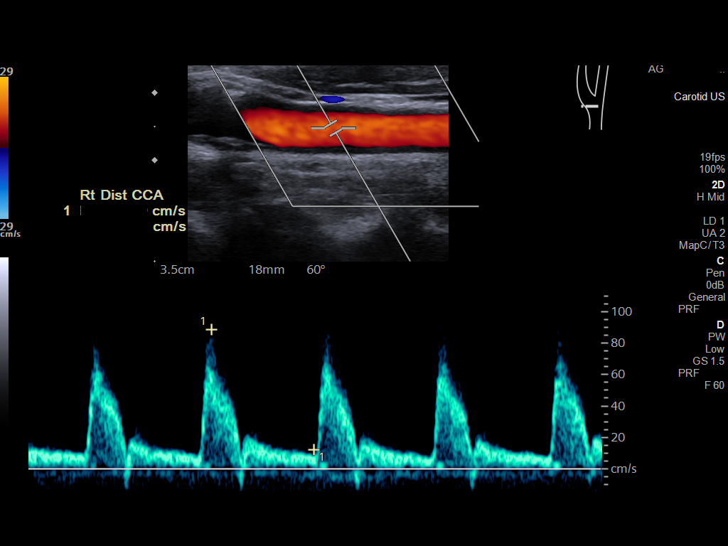
[im 18/68]
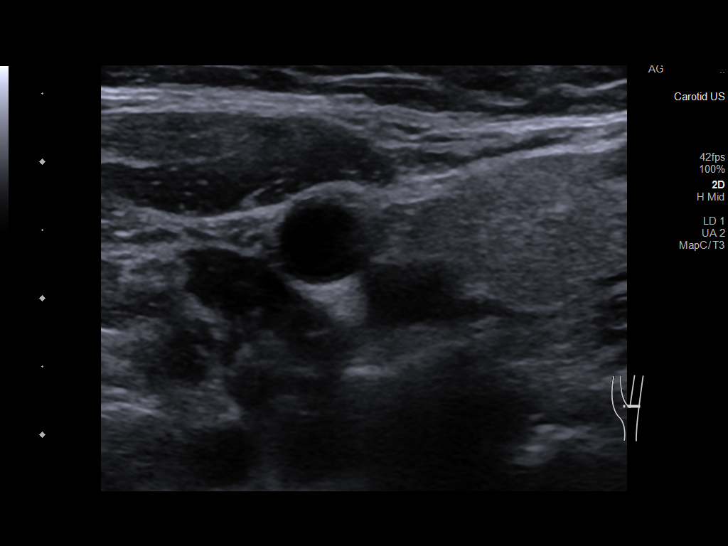
[im 24/68]
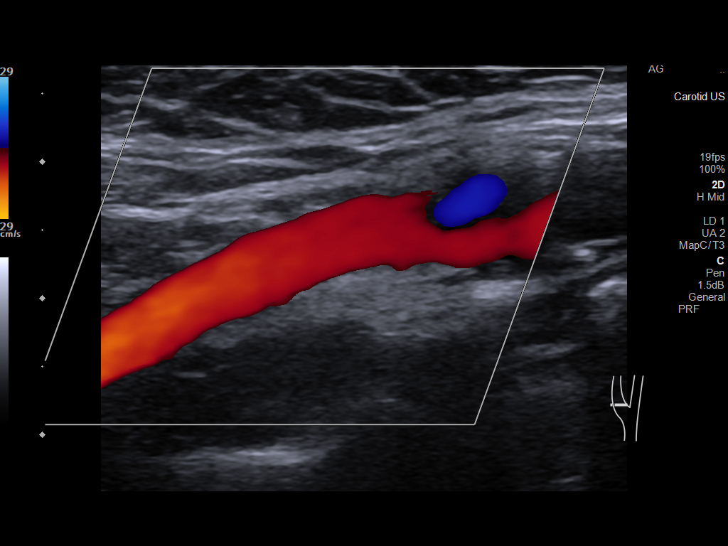
[im 30/68]
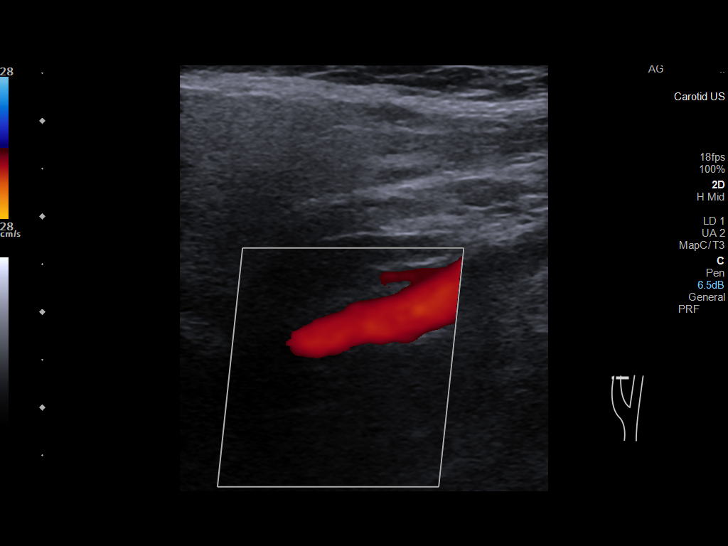
[im 35/68]
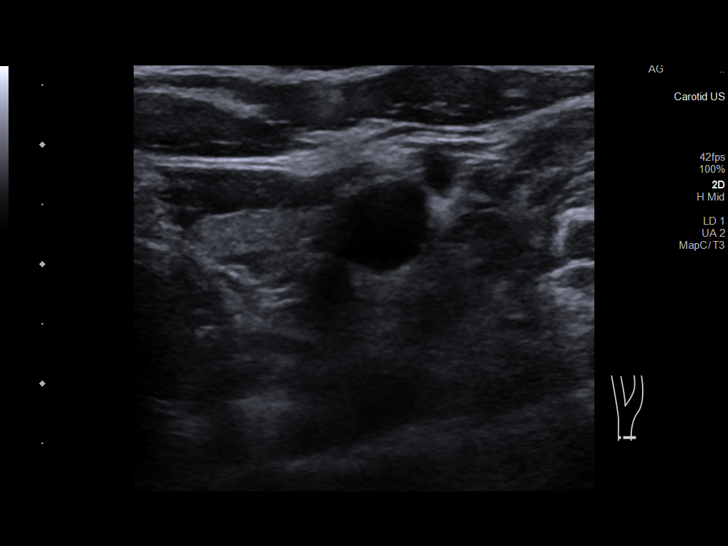
[im 38/68]
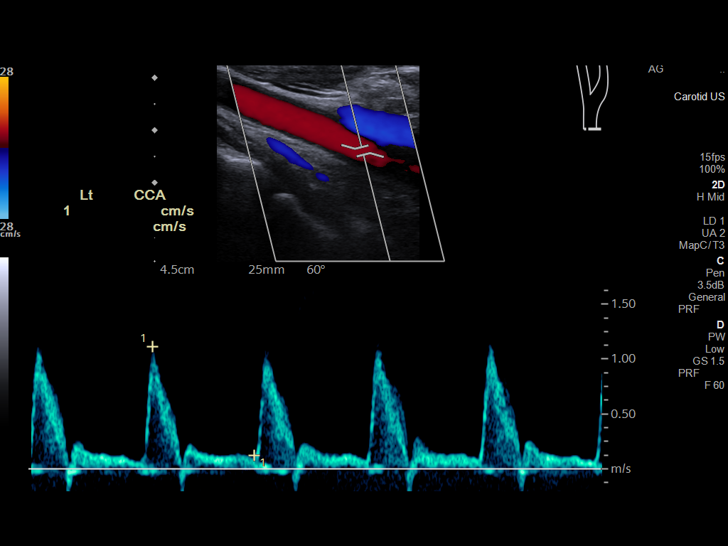
[im 44/68]
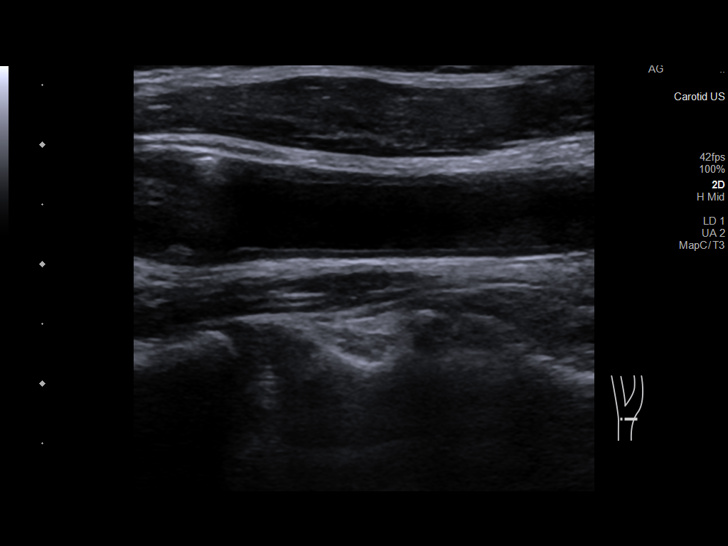
[im 50/68]
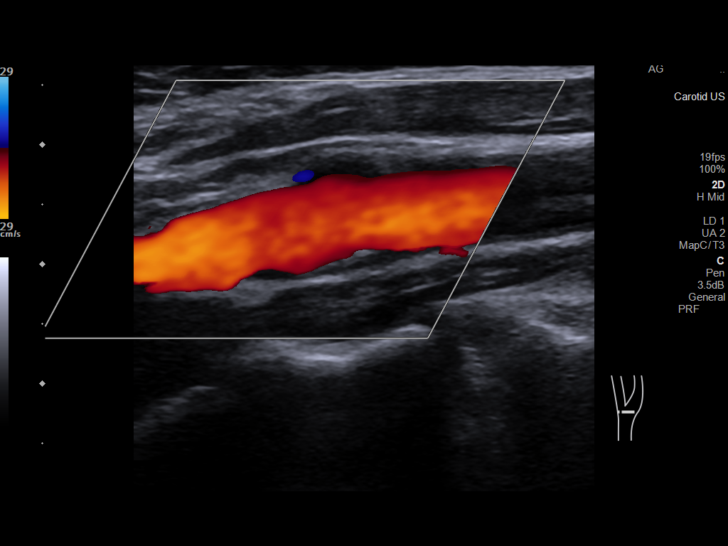
[im 56/68]
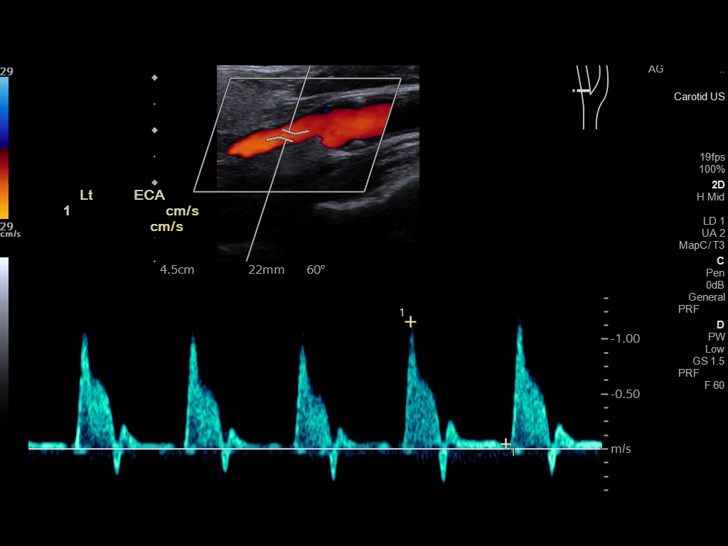
[im 62/68]
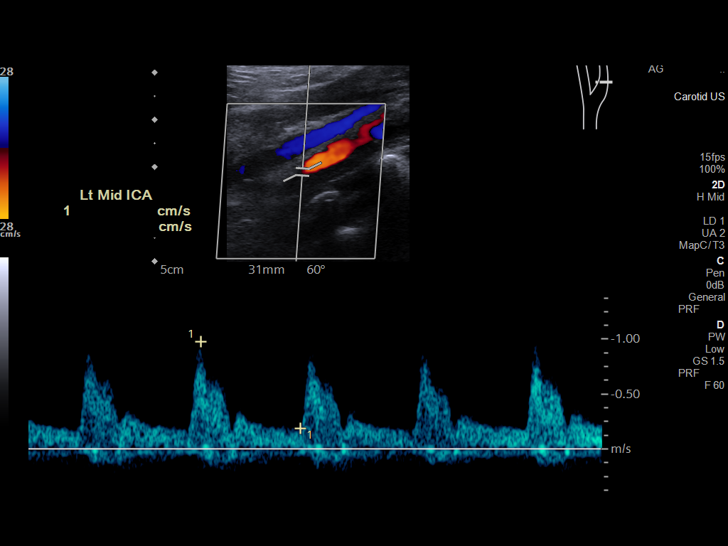
[im 68/68]
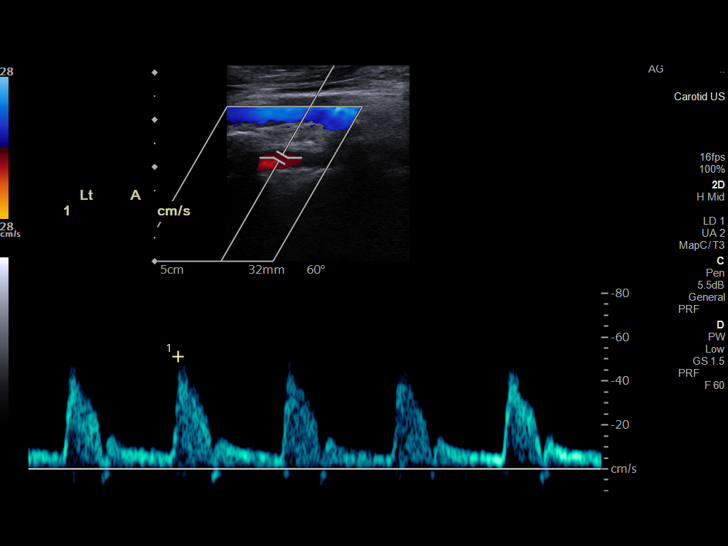

[13 of 24 positions shown; findings below may reference images not displayed]

FINDINGS: Criteria: Quantification of carotid stenosis is based on velocity
parameters that correlate the residual internal carotid diameter
with NASCET-based stenosis levels, using the diameter of the distal
internal carotid lumen as the denominator for stenosis measurement.

The following velocity measurements were obtained:

RIGHT

ICA: 113/24 cm/sec

CCA: 96/9 cm/sec

SYSTOLIC ICA/CCA RATIO:

ECA: 132 cm/sec

LEFT

ICA: 98/19 cm/sec

CCA: 90/13 cm/sec

SYSTOLIC ICA/CCA RATIO:

ECA: 116 cm/sec

RIGHT CAROTID ARTERY: Minor echogenic shadowing plaque formation. No
hemodynamically significant right ICA stenosis, velocity elevation,
or turbulent flow. Degree of narrowing less than 50%.

RIGHT VERTEBRAL ARTERY:  Normal antegrade flow

LEFT CAROTID ARTERY: Similar scattered minor echogenic plaque
formation. No hemodynamically significant left ICA stenosis,
velocity elevation, or turbulent flow.

LEFT VERTEBRAL ARTERY:  Normal antegrade flow
IMPRESSION: Minor carotid atherosclerosis. Negative for stenosis. Degree of
narrowing less than 50% bilaterally by ultrasound criteria.

Patent antegrade vertebral flow bilaterally

## 2020-10-28 MED ORDER — INSULIN NPH (HUMAN) (ISOPHANE) 100 UNIT/ML ~~LOC~~ SUSP
6.0000 [IU] | Freq: Every day | SUBCUTANEOUS | Status: DC
  Administered 2020-10-28: 6 [IU] via SUBCUTANEOUS
  Filled 2020-10-28: qty 10

## 2020-10-28 NOTE — Plan of Care (Signed)
  Problem: Education: Goal: Knowledge of General Education information will improve Description: Including pain rating scale, medication(s)/side effects and non-pharmacologic comfort measures Outcome: Progressing   Problem: Education: Goal: Knowledge of disease or condition will improve Outcome: Progressing Goal: Knowledge of secondary prevention will improve Outcome: Progressing   Problem: Coping: Goal: Will verbalize positive feelings about self Outcome: Progressing

## 2020-10-28 NOTE — Progress Notes (Signed)
Occupational Therapy Treatment Patient Details Name: Cheyenne [name] MRN: [id number] DOB: 05/11/1950 Today's Date: 10/28/2020    History of present illness Cheyenne [name]  is a 70 y.o. female, with history of diabetes mellitus type 2, and stroke 6 weeks ago, presents the ED with a chief complaint of generalized weakness.  Patient has had 4 or 5 falls in the last 5 days.  She reports that today she landed on her face.  She did not have loss of consciousness.  She reports that she tripped and fell over her house slippers.  She did not have any chest pain, palpitations, nausea, presyncopal feeling, or diaphoresis prior to her fall.  She denies any new asymmetrical weakness.  She does have residual left arm and leg weakness from the stroke 6 weeks ago.  She has not had any trouble with vision, speaking, swallowing.  She does not have any change in her hearing either.  Patient reports that she has been excessively fatigued.  Initially the ED attempted to discharge this patient but the daughter feels strongly that we are missing something as she reports that she is worse than when she was admitted to the hospital 6 weeks ago.  That admission was at [facility].  At this time patient is not complaining of any pain.  Earlier in the shift she was complaining of low back pain.  Daughter reported that she is a little more confused than normal, but patient is alert and oriented for me.  Patient has no other complaints at this time.   OT comments  Pt pleasant and engaged this date. Pt remains largely the same as last session for functional mobility. Pt required min to mod A for bed mobility and Min A for sit to stand and functional ambulation to sink and back. Pt able to attempt hair brushing at sink for ~3 to 5 minutes prior to reporting fatigue and needing to sit. Pt used R UE to brush only the R side of her head. Pt was unable to use L UE to brush hair. In supine pt was able to demonstrate a maximum A/ROM for  shoulder flexion of ~70* which decreased with reps likely due to fatigue. A/AROM used to follow through with rest of range. Pt will benefit from continued OT in the hospital and recommended venue below to increase strength, balance, and endurance for safe ADL's.     Follow Up Recommendations  SNF    Equipment Recommendations  None recommended by OT          Precautions / Restrictions Precautions Precautions: Fall Restrictions Weight Bearing Restrictions: No       Mobility Bed Mobility Overal bed mobility: Needs Assistance Bed Mobility: Sit to Supine;Supine to Sit     Supine to sit: Min assist;Mod assist Sit to supine: Mod assist   General bed mobility comments: Slow labored movement.    Transfers Overall transfer level: Needs assistance Equipment used: Rolling walker (2 wheeled) Transfers: Sit to/from [facility] Sit to Stand: Min assist Stand pivot transfers: Min assist       General transfer comment: Pt required tactile cuing and min A to redirect RW during ambulation. Pt also required assist due to mild posterior lean in standing.    Balance Overall balance assessment: Needs assistance Sitting-balance support: Feet supported;No upper extremity supported Sitting balance-Leahy Scale: Fair Sitting balance - Comments: seated at EOB Postural control: Left lateral lean Standing balance support: During functional activity;Bilateral upper extremity supported Standing balance-Leahy Scale: Fair  Standing balance comment: using RW with additional assist from therapist.                           ADL either performed or assessed with clinical judgement   ADL Overall ADL's : Needs assistance/impaired     Grooming: Minimal assistance;Standing;Cueing for sequencing;Wash/dry hands;Brushing hair Grooming Details (indicate cue type and reason): Pt stood at sink using RW and Min A for ~ 3 to 5 minutes.                                                        Cognition Arousal/Alertness: Awake/alert Behavior During Therapy: WFL for tasks assessed/performed Overall Cognitive Status: No family/caregiver present to determine baseline cognitive functioning                                          Exercises Exercises: General Upper Extremity General Exercises - Upper Extremity Shoulder Flexion: PROM;10 reps;AAROM;5 reps;Left;Supine Shoulder ABduction: PROM;10 reps;Left;Supine (x10 internal and external rotation P/ROM as well.)                Pertinent Vitals/ Pain       Pain Assessment: Faces Faces Pain Scale: Hurts little more Pain Location: left knee Pain Descriptors / Indicators: Grimacing;Moaning Pain Intervention(s): Monitored during session;Limited activity within patient's tolerance;Repositioned                                                          Frequency  Min 2X/week        Progress Toward Goals  OT Goals(current goals can now be found in the care plan section)  Progress towards OT goals: Progressing toward goals  Acute Rehab OT Goals Patient Stated Goal: return home Time For Goal Achievement: 11/08/20 Potential to Achieve Goals: Fair ADL Goals Pt Will Perform Grooming: standing;with min guard assist;with adaptive equipment Pt Will Perform Upper Body Dressing: with supervision;sitting;with adaptive equipment Pt Will Perform Lower Body Dressing: with min assist;sitting/lateral leans;with adaptive equipment;sit to/from stand Pt Will Transfer to Toilet: with supervision;with min guard assist;stand pivot transfer Pt/caregiver will Perform Home Exercise Program: Increased ROM;Both right and left upper extremity;With minimal assist  Plan Discharge plan remains appropriate                                    End of Session Equipment Utilized During Treatment: Rolling walker  OT Visit Diagnosis: Unsteadiness on feet  (R26.81);Repeated falls (R29.6);History of falling (Z91.81);Muscle weakness (generalized) (M62.81);Other symptoms and signs involving cognitive function;Hemiplegia and hemiparesis Hemiplegia - Right/Left: Left Hemiplegia - dominant/non-dominant: Non-Dominant   Activity Tolerance Patient tolerated treatment well   Patient Left in bed;with call bell/phone within reach;with bed alarm set             Time: 0917-0950 OT Time Calculation (min): 33 min  Charges: OT General Charges $OT Visit: 1 Visit OT Treatments $Self Care/Home Management : 8-22 mins $Therapeutic Exercise: 8-22 mins  Savino Whisenant OT, MOT   Larey Seat 10/28/2020, 12:11 PM

## 2020-10-28 NOTE — Progress Notes (Signed)
Patient's blood glucose 402 mg/dL at bedtime cbg check. 5 units humalog given as ordered by O. Adefeso MD via telephone.

## 2020-10-28 NOTE — Progress Notes (Addendum)
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  Work-up in the ED was reassuring including radiographs to rule out fracture from falls and CT head without acute stroke of hemorrhage.  Her mentation appears somewhat confused which is not her baseline, hospitalist were consulted and the patient was admitted early for further evaluation.  Patient has had wide variation in blood glucose including symptomatic hypoglycemia down to 35.   MRI was positive for acute stroke.   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  MRI; 1.3 cm acute infarct in the right frontal white matter with surrounding punctate acute frontal infarcts. MRA; No large Vessel Occlusion.  On aspirin. Statins.  Awaiting Neurology evaluation. Added Dr Lily Lovings to treatment team. Left message.  LDL 71.  On aspirin prior to admission.  ECHO; no source of embolism.  Carotid doppler: Minor carotid atherosclerosis. Negative for stenosis. Degree of narrowing less than 50% bilaterally by ultrasound criteria. Appreciate Neurology assistance. Plan for plavix for one week them aspirin alone.  Cardiology consulted to help arrange Holter.  Awaiting SNF  2-DM type 2.  Hold metformin and glimepiride.  Had Hypoglycemic episodes.  Continue with SSI.  A1c at 9.  Due to low Blood sugar patient was getting lower dose  insulin Novolin at home, per daughter.  She will need lower insulin doses at discharge.  Plan to start NPH low dose daily.   History of CVA;  Continue with statins, aspirin.    HTN: Continue with Norvasc and losartan.  Estimated body mass index is 23.54 kg/m as calculated from the following:   Height as of this encounter: '5\' 4"'$  (1.626 m).   Weight as of this encounter: 62.2 kg.   DVT prophylaxis: Heparin Code Status: Full code Family Communication: grandaughter over phone 8/24.  Disposition Plan:  Status is: Inpatient  Remains inpatient appropriate because:IV treatments appropriate due to intensity of illness or inability to take PO  Dispo: The patient is from: Home              Anticipated d/c is to: SNF              Patient currently is medically stable to d/c.   Difficult to place patient No        Consultants:  Neurology.   Procedures:  ECHO  Antimicrobials:    Subjective: She is alert, oriented to person, working with PT No new complaints.   Objective: Vitals:   10/28/20 0607 10/28/20 0903 10/28/20 0910 10/28/20 1435  BP: 136/70 133/60 133/60 (!) 123/59  Pulse: 86 93  92  Resp: 20   18  Temp: 98.4 F (36.9 C)   98 F (36.7 C)  TempSrc:    Oral  SpO2: 92% 93%  94%  Weight:      Height:        Intake/Output Summary (Last 24 hours) at 10/28/2020 1522 Last data filed at 10/27/2020 1700  Gross per 24 hour  Intake 240 ml  Output --  Net 240 ml    Filed Weights   10/26/20 0500 10/27/20 0500 10/28/20 0500  Weight: 62 kg 62.1 kg 62.2 kg    Examination:  General exam: NAD Respiratory system: CTA Cardiovascular system:  S1, S2  RRR Gastrointestinal system: BS present, soft, nt Central nervous system: alert oriented left side weakness Extremities: No edema    Data Reviewed: I have personally reviewed following labs and imaging studies  CBC: Recent Labs  Lab 10/24/20 2058 10/25/20 0432 10/27/20 0558  WBC 9.4 10.1 6.2  NEUTROABS  --   7.6  --   HGB 16.4* 14.9 14.2  HCT 50.5* 45.4 42.9  MCV 88.9 89.2 87.9  PLT 276 257 [id number]    Basic Metabolic Panel: Recent Labs  Lab 10/24/20 2058 10/25/20 0432 10/27/20 0558  NA 137 138 138  K 4.2 3.9 3.7  CL 105 108 101  CO2 25 21* 25  GLUCOSE 93 43* 126*  BUN '15 16 14  '$ CREATININE 0.90 0.74 0.81  CALCIUM 9.5 8.7* 9.3  MG  --  2.1  --     GFR: Estimated Creatinine Clearance: 56.6 mL/min (by C-G formula based on SCr of 0.81 mg/dL). Liver Function Tests: Recent Labs  Lab 10/24/20 2058 10/25/20 0432  AST 16 15  ALT 13 12  ALKPHOS 86 78  BILITOT 0.7 0.7  PROT 8.1 7.2  ALBUMIN 4.0 3.6    No results for input(s): LIPASE, AMYLASE in the last 168 hours. Recent Labs  Lab 10/25/20 1024  AMMONIA 11    Coagulation Profile: No results for input(s): INR, PROTIME in the last 168 hours. Cardiac Enzymes: No results for input(s): CKTOTAL, CKMB, CKMBINDEX, TROPONINI in the last 168 hours. BNP (last 3 results) No results for input(s): PROBNP in the last 8760 hours. HbA1C: No results for input(s): HGBA1C in the last 72 hours.  CBG: Recent Labs  Lab 10/27/20 1101 10/27/20 1609 10/27/20 2038 10/28/20 0827 10/28/20 1132  GLUCAP 226* 213* 259* 177* 262*    Lipid Profile: No results for input(s): CHOL, HDL, LDLCALC, TRIG, CHOLHDL, LDLDIRECT in the last 72 hours.  Thyroid Function Tests: No results for input(s): TSH, T4TOTAL, FREET4, T3FREE, THYROIDAB in the last 72 hours.  Anemia Panel: No results for input(s): VITAMINB12, FOLATE, FERRITIN, TIBC, IRON, RETICCTPCT in the last 72 hours. Sepsis Labs: No results for input(s): PROCALCITON, LATICACIDVEN in the last 168 hours.  Recent Results (from the past 240 hour(s))  SARS CORONAVIRUS 2 (TAT 6-24 HRS) Nasopharyngeal Nasopharyngeal Swab     Status: None   Collection Time: 10/25/20  2:37 AM   Specimen: Nasopharyngeal Swab  Result Value Ref Range Status   SARS Coronavirus 2 NEGATIVE NEGATIVE Final    Comment:  (NOTE) SARS-CoV-2 target nucleic acids are NOT DETECTED.  The SARS-CoV-2 RNA is generally detectable in upper and lower respiratory specimens during the acute phase of infection. Negative results do not preclude SARS-CoV-2 infection, do not rule out co-infections with other pathogens, and should not be used as the sole basis for treatment or other patient management decisions. Negative results must be combined with clinical observations, patient history, and epidemiological information. The expected result is Negative.  Fact Sheet for Patients: SugarRoll.be  Fact Sheet for Healthcare Providers: https://www.woods-mathews.com/  This test is not yet approved or cleared by the [place] FDA and  has been authorized for detection and/or diagnosis of SARS-CoV-2 by FDA under an Emergency Use Authorization (EUA). This  EUA will remain  in effect (meaning this test can be used) for the duration of the COVID-19 declaration under Se ction 564(b)(1) of the Act, 21 U.S.C. section 360bbb-3(b)(1), unless the authorization is terminated or revoked sooner.  Performed at [facility], [place] [address] [place], [place] [address]           Radiology Studies: US Carotid Bilateral  Result Date: 10/28/2020 CLINICAL DATA:  Previous stroke, recent falls, left-sided weakness EXAM: BILATERAL CAROTID DUPLEX ULTRASOUND TECHNIQUE: Pearline Cables scale imaging, color Doppler and duplex ultrasound were performed of bilateral carotid and vertebral arteries in the neck. COMPARISON:  None. FINDINGS: Criteria: Quantification of carotid stenosis is based on velocity parameters that correlate the residual internal carotid diameter with NASCET-based stenosis levels, using the diameter of the distal internal carotid lumen as the denominator for stenosis measurement. The following velocity measurements were obtained: RIGHT ICA: 113/24 cm/sec CCA: [id number] cm/sec SYSTOLIC ICA/CCA  RATIO:  1.2 ECA: 132 cm/sec LEFT ICA: 98/19 cm/sec CCA: [id number] cm/sec SYSTOLIC ICA/CCA RATIO:  1.1 ECA: 116 cm/sec RIGHT CAROTID ARTERY: Minor echogenic shadowing plaque formation. No hemodynamically significant right ICA stenosis, velocity elevation, or turbulent flow. Degree of narrowing less than 50%. RIGHT VERTEBRAL ARTERY:  Normal antegrade flow LEFT CAROTID ARTERY: Similar scattered minor echogenic plaque formation. No hemodynamically significant left ICA stenosis, velocity elevation, or turbulent flow. LEFT VERTEBRAL ARTERY:  Normal antegrade flow IMPRESSION: Minor carotid atherosclerosis. Negative for stenosis. Degree of narrowing less than 50% bilaterally by ultrasound criteria. Patent antegrade vertebral flow bilaterally Electronically Signed   By: Jerilynn Mages.  Shick M.D.   On: 10/28/2020 11:38        Scheduled Meds:   stroke: mapping our early stages of recovery book   Does not apply Once   amLODipine  10 mg Oral Daily   aspirin EC  81 mg Oral Daily   atorvastatin  40 mg Oral Daily   clopidogrel  75 mg Oral Q breakfast   escitalopram  10 mg Oral Daily   heparin  5,000 Units Subcutaneous Q8H   insulin aspart  0-5 Units Subcutaneous QHS   insulin aspart  0-9 Units Subcutaneous TID WC   insulin NPH Human  6 Units Subcutaneous QAC breakfast   loratadine  10 mg Oral Daily   losartan  50 mg Oral BID   rOPINIRole  0.25 mg Oral QHS   Continuous Infusions:   LOS: 3 days    Time spent: 35 minutes.      Elmarie Shiley, MD [facility]   If 7PM-7AM, please contact night-coverage [email or web address]  10/28/2020, 3:22 PM

## 2020-10-28 NOTE — Progress Notes (Signed)
Physical Therapy Treatment Patient Details Name: RORI MEINDERS MRN: [id number] DOB: [birth year] Today's Date: 10/28/2020    History of Present Illness Betzaida Ahlstrand  is a 70 y.o. female, with history of diabetes mellitus type 2, and stroke 6 weeks ago, presents the ED with a chief complaint of generalized weakness.  Patient has had 4 or 5 falls in the last 5 days.  She reports that today she landed on her face.  She did not have loss of consciousness.  She reports that she tripped and fell over her house slippers.  She did not have any chest pain, palpitations, nausea, presyncopal feeling, or diaphoresis prior to her fall.  She denies any new asymmetrical weakness.  She does have residual left arm and leg weakness from the stroke 6 weeks ago.  She has not had any trouble with vision, speaking, swallowing.  She does not have any change in her hearing either.  Patient reports that she has been excessively fatigued.  Initially the ED attempted to discharge this patient but the daughter feels strongly that we are missing something as she reports that she is worse than when she was admitted to the hospital 6 weeks ago.  That admission was at [facility].  At this time patient is not complaining of any pain.  Earlier in the shift she was complaining of low back pain.  Daughter reported that she is a little more confused than normal, but patient is alert and oriented for me.  Patient has no other complaints at this time.    PT Comments    Patient requires assist for all mobility today along with cueing for sequencing and safety. Patient demonstrates good sitting balance EOB while completing exercises with frequent cueing to continue performing. Patient ambulates with unsteady cadence with use of RW in room. Patient returned to bed at end of session and was set up for lunch. Patient will benefit from continued physical therapy in hospital and recommended venue below to increase strength, balance, endurance  for safe ADLs and gait.   Follow Up Recommendations        Equipment Recommendations  None recommended by PT    Recommendations for Other Services       Precautions / Restrictions Precautions Precautions: Fall Restrictions Weight Bearing Restrictions: No    Mobility  Bed Mobility Overal bed mobility: Needs Assistance Bed Mobility: Sit to Supine;Supine to Sit     Supine to sit: Min assist;Mod assist Sit to supine: Min assist;Mod assist   General bed mobility comments: Slow labored movement.    Transfers Overall transfer level: Needs assistance Equipment used: Rolling walker (2 wheeled) Transfers: Sit to/from [facility] Sit to Stand: Min assist Stand pivot transfers: Min assist       General transfer comment: requires cueing for sequencing and use of RW with assist to transfer to standing  Ambulation/Gait Ambulation/Gait assistance: Min assist;Mod assist Gait Distance (Feet): 12 Feet Assistive device: Rolling walker (2 wheeled) Gait Pattern/deviations: Decreased step length - right;Decreased step length - left;Decreased stride length Gait velocity: decreased   General Gait Details: cueing for sequencing and safety, assist for balance and RW use throughout   Stairs             Wheelchair Mobility    Modified Rankin (Stroke Patients Only)       Balance Overall balance assessment: Needs assistance Sitting-balance support: Feet supported;No upper extremity supported Sitting balance-Leahy Scale: Fair Sitting balance - Comments: seated at EOB Postural control: Left  lateral lean Standing balance support: Bilateral upper extremity supported Standing balance-Leahy Scale: Fair Standing balance comment: using RW with additional assist from therapist.                            Cognition Arousal/Alertness: Awake/alert Behavior During Therapy: WFL for tasks assessed/performed Overall Cognitive Status: No family/caregiver  present to determine baseline cognitive functioning                                        Exercises General Exercises - Upper Extremity Shoulder Flexion: PROM;10 reps;AAROM;5 reps;Left;Supine Shoulder ABduction: PROM;10 reps;Left;Supine (x10 internal and external rotation P/ROM as well.) General Exercises - Lower Extremity Ankle Circles/Pumps: Both;10 reps;Seated;AROM Long Arc Quad: AROM;Both;10 reps;Seated Hip Flexion/Marching: AROM;Both;10 reps;Seated    General Comments        Pertinent Vitals/Pain Pain Assessment: No/denies pain Faces Pain Scale: Hurts little more Pain Location: left knee Pain Descriptors / Indicators: Grimacing;Moaning Pain Intervention(s): Monitored during session;Limited activity within patient's tolerance;Repositioned    Home Living                      Prior Function            PT Goals (current goals can now be found in the care plan section) Acute Rehab PT Goals Patient Stated Goal: return home PT Goal Formulation: With patient Time For Goal Achievement: 11/08/20 Potential to Achieve Goals: Good Progress towards PT goals: Progressing toward goals    Frequency    Min 3X/week      PT Plan Current plan remains appropriate    Co-evaluation              AM-PAC PT "6 Clicks" Mobility   Outcome Measure  Help needed turning from your back to your side while in a flat bed without using bedrails?: A Little Help needed moving from lying on your back to sitting on the side of a flat bed without using bedrails?: A Lot Help needed moving to and from a bed to a chair (including a wheelchair)?: A Lot Help needed standing up from a chair using your arms (e.g., wheelchair or bedside chair)?: A Lot Help needed to walk in hospital room?: A Lot Help needed climbing 3-5 steps with a railing? : Total 6 Click Score: 12    End of Session   Activity Tolerance: Patient tolerated treatment well Patient left: in bed;with  call bell/phone within reach;with bed alarm set Nurse Communication: Mobility status PT Visit Diagnosis: Unsteadiness on feet (R26.81);Other abnormalities of gait and mobility (R26.89);Muscle weakness (generalized) (M62.81)     Time: 1343-1400 PT Time Calculation (min) (ACUTE ONLY): 17 min  Charges:  $Therapeutic Exercise: 8-22 mins                     2:27 PM, 10/28/20 Mearl Latin PT, DPT Physical Therapist at [facility]

## 2020-10-28 NOTE — TOC Progression Note (Signed)
Transition of Care [facility]) - Progression Note    Patient Details  Name: CONLEY POITEVINT MRN: [id number] Date of Birth: July 03, 1950  Transition [facility]) CM/SW Contact  Salome Arnt, [place] Phone Number: 10/28/2020, 11:06 AM  Clinical Narrative:  LCSW spoke with admissions at [facility]. They will start [employer] authorization this morning. LCSW attempted to reach pt's granddaughter to confirm bed acceptance- voicemail left. Per Bria (TOC) granddaughter wanted [facility] as pt's husband is there.       Barriers to Discharge: Continued Medical Work up  Expected Discharge Plan and Services                                                 Social Determinants of Health (SDOH) Interventions    Readmission Risk Interventions No flowsheet data found.

## 2020-10-29 DIAGNOSIS — I639 Cerebral infarction, unspecified: Secondary | ICD-10-CM | POA: Diagnosis not present

## 2020-10-29 LAB — BASIC METABOLIC PANEL
Anion gap: 7 (ref 5–15)
BUN: 18 mg/dL (ref 8–23)
CO2: 25 mmol/L (ref 22–32)
Calcium: 8.8 mg/dL — ABNORMAL LOW (ref 8.9–10.3)
Chloride: 103 mmol/L (ref 98–111)
Creatinine, Ser: 0.83 mg/dL (ref 0.44–1.00)
GFR, Estimated: >60 mL/min (ref >60)
Glucose, Bld: 294 mg/dL — ABNORMAL HIGH (ref 70–99)
Potassium: 3.8 mmol/L (ref 3.5–5.1)
Sodium: 135 mmol/L (ref 135–145)

## 2020-10-29 LAB — GLUCOSE, CAPILLARY
Glucose-Capillary: 268 mg/dL — ABNORMAL HIGH (ref 70–99)
Glucose-Capillary: 204 mg/dL — ABNORMAL HIGH (ref 70–99)
Glucose-Capillary: 95 mg/dL (ref 70–99)

## 2020-10-29 MED ORDER — INSULIN NPH (HUMAN) (ISOPHANE) 100 UNIT/ML ~~LOC~~ SUSP
10.0000 [IU] | Freq: Two times a day (BID) | SUBCUTANEOUS | Status: DC
  Administered 2020-10-29 – 2020-10-30 (×2): 10 [IU] via SUBCUTANEOUS
  Filled 2020-10-29: qty 10

## 2020-10-29 MED ORDER — NICOTINE 21 MG/24HR TD PT24
21.0000 mg | MEDICATED_PATCH | Freq: Every day | TRANSDERMAL | Status: DC
Start: 2020-10-29 — End: 2020-10-31
  Administered 2020-10-29 – 2020-10-31 (×3): 21 mg via TRANSDERMAL
  Filled 2020-10-29 (×3): qty 1

## 2020-10-29 NOTE — Progress Notes (Signed)
PROGRESS NOTE    Cheyenne [name]  [id number] DOB: June 08, 1950 DOA: 10/24/2020 PCP: Toni Arthurs, PA   Brief Narrative: 70 year old with past medical history significant for CVA 6 weeks prior to this admission with resultant left-sided hemiparesis, diabetes type 2 who was brought to the ED the night of admission due to increasing falls and generalized weakness at home.  Work-up in the ED was reassuring including radiographs to rule out fracture from falls and CT head without acute stroke of hemorrhage.  Her mentation appears somewhat confused which is not her baseline, hospitalist were consulted and the patient was admitted early for further evaluation.  Patient has had wide variation in blood glucose including symptomatic hypoglycemia down to 35.   MRI was positive for acute stroke.   Assessment & Plan:   Active Problems:   Generalized weakness   Hypertension   Type 2 diabetes mellitus with hyperlipidemia [facility])   Psychiatric problem   Cerebrovascular accident (CVA) ([place])   1-Acute Infarct in the Right Frontal white matter with surrounding punctate acute frontal infarct; Numerous supratentorial chronic micro hemorrhages, probably related to amyloid angiopathy.  MRI; 1.3 cm acute infarct in the right frontal white matter with surrounding punctate acute frontal infarcts. MRA; No large Vessel Occlusion.  On aspirin. Statins.  Awaiting Neurology evaluation. Added Dr Lily Lovings to treatment team. Left message.  LDL 71.  On aspirin prior to admission.  ECHO; no source of embolism.  Carotid doppler: Minor carotid atherosclerosis. Negative for stenosis. Degree of narrowing less than 50% bilaterally by ultrasound criteria. Appreciate Neurology assistance. Plan for plavix for one week them aspirin alone. Plavix started : 8/25. Cardiology consulted to help arrange Holter.  Awaiting SNF Stable.   2-DM type 2.  Hold metformin and glimepiride.  Had Hypoglycemic episodes.  Continue with  SSI.  A1c at 9.  Due to low Blood sugar patient was getting lower dose insulin Novolin at home, per daughter.  She will need lower insulin doses at discharge.  Change NPH to BID>   History of CVA;  Continue with statins, aspirin.    HTN: Continue with Norvasc and losartan.  Estimated body mass index is 23.31 kg/m as calculated from the following:   Height as of this encounter: '5\' 4"'$  (1.626 m).   Weight as of this encounter: 61.6 kg.   DVT prophylaxis: Heparin Code Status: Full code Family Communication: grandaughter over phone 8/26.  Disposition Plan:  Status is: Inpatient  Remains inpatient appropriate because:IV treatments appropriate due to intensity of illness or inability to take PO  Dispo: The patient is from: Home              Anticipated d/c is to: SNF              Patient currently is medically stable to d/c.   Difficult to place patient No        Consultants:  Neurology.   Procedures:  ECHO  Antimicrobials:    Subjective: Alert, denies pain. She knows she had another stroke. She thought she was at her daughter house.   Objective: Vitals:   10/28/20 1435 10/29/20 0500 10/29/20 0800 10/29/20 1431  BP: (!) 123/59  134/69 (!) 119/59  Pulse: 92   75  Resp: 18   18  Temp: 98 F (36.7 C)   97.7 F (36.5 C)  TempSrc: Oral   Oral  SpO2: 94%   96%  Weight:  61.6 kg    Height:  Intake/Output Summary (Last 24 hours) at 10/29/2020 1522 Last data filed at 10/29/2020 0900 Gross per 24 hour  Intake 240 ml  Output --  Net 240 ml    Filed Weights   10/27/20 0500 10/28/20 0500 10/29/20 0500  Weight: 62.1 kg 62.2 kg 61.6 kg    Examination:  General exam: NAD Respiratory system: CTA Cardiovascular system:  S 1, S 2 RRR Gastrointestinal system: BS present, soft, nt Central nervous system: alert oriented tomes 1. Left side weakness Extremities: no edema    Data Reviewed: I have personally reviewed following labs and imaging  studies  CBC: Recent Labs  Lab 10/24/20 2058 10/25/20 0432 10/27/20 0558  WBC 9.4 10.1 6.2  NEUTROABS  --  7.6  --   HGB 16.4* 14.9 14.2  HCT 50.5* 45.4 42.9  MCV 88.9 89.2 87.9  PLT 276 257 [id number]    Basic Metabolic Panel: Recent Labs  Lab 10/24/20 2058 10/25/20 0432 10/27/20 0558 10/29/20 0746  NA 137 138 138 135  K 4.2 3.9 3.7 3.8  CL 105 108 101 103  CO2 25 21* 25 25  GLUCOSE 93 43* 126* 294*  BUN '15 16 14 18  '$ CREATININE 0.90 0.74 0.81 0.83  CALCIUM 9.5 8.7* 9.3 8.8*  MG  --  2.1  --   --     GFR: Estimated Creatinine Clearance: 55.2 mL/min (by C-G formula based on SCr of 0.83 mg/dL). Liver Function Tests: Recent Labs  Lab 10/24/20 2058 10/25/20 0432  AST 16 15  ALT 13 12  ALKPHOS 86 78  BILITOT 0.7 0.7  PROT 8.1 7.2  ALBUMIN 4.0 3.6    No results for input(s): LIPASE, AMYLASE in the last 168 hours. Recent Labs  Lab 10/25/20 1024  AMMONIA 11    Coagulation Profile: No results for input(s): INR, PROTIME in the last 168 hours. Cardiac Enzymes: No results for input(s): CKTOTAL, CKMB, CKMBINDEX, TROPONINI in the last 168 hours. BNP (last 3 results) No results for input(s): PROBNP in the last 8760 hours. HbA1C: No results for input(s): HGBA1C in the last 72 hours.  CBG: Recent Labs  Lab 10/28/20 1132 10/28/20 1611 10/28/20 2211 10/29/20 0757 10/29/20 1158  GLUCAP 262* 143* 402* 268* 204*    Lipid Profile: No results for input(s): CHOL, HDL, LDLCALC, TRIG, CHOLHDL, LDLDIRECT in the last 72 hours.  Thyroid Function Tests: No results for input(s): TSH, T4TOTAL, FREET4, T3FREE, THYROIDAB in the last 72 hours.  Anemia Panel: No results for input(s): VITAMINB12, FOLATE, FERRITIN, TIBC, IRON, RETICCTPCT in the last 72 hours. Sepsis Labs: No results for input(s): PROCALCITON, LATICACIDVEN in the last 168 hours.  Recent Results (from the past 240 hour(s))  SARS CORONAVIRUS 2 (TAT 6-24 HRS) Nasopharyngeal Nasopharyngeal Swab     Status: None    Collection Time: 10/25/20  2:37 AM   Specimen: Nasopharyngeal Swab  Result Value Ref Range Status   SARS Coronavirus 2 NEGATIVE NEGATIVE Final    Comment: (NOTE) SARS-CoV-2 target nucleic acids are NOT DETECTED.  The SARS-CoV-2 RNA is generally detectable in upper and lower respiratory specimens during the acute phase of infection. Negative results do not preclude SARS-CoV-2 infection, do not rule out co-infections with other pathogens, and should not be used as the sole basis for treatment or other patient management decisions. Negative results must be combined with clinical observations, patient history, and epidemiological information. The expected result is Negative.  Fact Sheet for Patients: SugarRoll.be  Fact Sheet for Healthcare Providers: https://www.woods-mathews.com/  This test is  not yet approved or cleared by the [place] and  has been authorized for detection and/or diagnosis of SARS-CoV-2 by FDA under an Emergency Use Authorization (EUA). This EUA will remain  in effect (meaning this test can be used) for the duration of the COVID-19 declaration under Se ction 564(b)(1) of the Act, 21 U.S.C. section 360bbb-3(b)(1), unless the authorization is terminated or revoked sooner.  Performed at [facility], [place] [address], [place], [place] [address]           Radiology Studies: US Carotid Bilateral  Result Date: 10/28/2020 CLINICAL DATA:  Previous stroke, recent falls, left-sided weakness EXAM: BILATERAL CAROTID DUPLEX ULTRASOUND TECHNIQUE: Pearline Cables scale imaging, color Doppler and duplex ultrasound were performed of bilateral carotid and vertebral arteries in the neck. COMPARISON:  None. FINDINGS: Criteria: Quantification of carotid stenosis is based on velocity parameters that correlate the residual internal carotid diameter with NASCET-based stenosis levels, using the diameter of the distal internal carotid  lumen as the denominator for stenosis measurement. The following velocity measurements were obtained: RIGHT ICA: 113/24 cm/sec CCA: [id number] cm/sec SYSTOLIC ICA/CCA RATIO:  1.2 ECA: 132 cm/sec LEFT ICA: 98/19 cm/sec CCA: [id number] cm/sec SYSTOLIC ICA/CCA RATIO:  1.1 ECA: 116 cm/sec RIGHT CAROTID ARTERY: Minor echogenic shadowing plaque formation. No hemodynamically significant right ICA stenosis, velocity elevation, or turbulent flow. Degree of narrowing less than 50%. RIGHT VERTEBRAL ARTERY:  Normal antegrade flow LEFT CAROTID ARTERY: Similar scattered minor echogenic plaque formation. No hemodynamically significant left ICA stenosis, velocity elevation, or turbulent flow. LEFT VERTEBRAL ARTERY:  Normal antegrade flow IMPRESSION: Minor carotid atherosclerosis. Negative for stenosis. Degree of narrowing less than 50% bilaterally by ultrasound criteria. Patent antegrade vertebral flow bilaterally Electronically Signed   By: Jerilynn Mages.  Shick M.D.   On: 10/28/2020 11:38        Scheduled Meds:   stroke: mapping our early stages of recovery book   Does not apply Once   amLODipine  10 mg Oral Daily   aspirin EC  81 mg Oral Daily   atorvastatin  40 mg Oral Daily   clopidogrel  75 mg Oral Q breakfast   escitalopram  10 mg Oral Daily   heparin  5,000 Units Subcutaneous Q8H   insulin aspart  0-5 Units Subcutaneous QHS   insulin aspart  0-9 Units Subcutaneous TID WC   insulin NPH Human  10 Units Subcutaneous BID AC & HS   loratadine  10 mg Oral Daily   losartan  50 mg Oral BID   nicotine  21 mg Transdermal Daily   rOPINIRole  0.25 mg Oral QHS   Continuous Infusions:   LOS: 4 days    Time spent: 35 minutes.      Elmarie Shiley, MD [facility]   If 7PM-7AM, please contact night-coverage [email or web address]  10/29/2020, 3:22 PM

## 2020-10-30 ENCOUNTER — Inpatient Hospital Stay (HOSPITAL_COMMUNITY): Payer: Medicare HMO

## 2020-10-30 DIAGNOSIS — I639 Cerebral infarction, unspecified: Secondary | ICD-10-CM | POA: Diagnosis not present

## 2020-10-30 LAB — BASIC METABOLIC PANEL
Anion gap: 13 (ref 5–15)
BUN: 17 mg/dL (ref 8–23)
CO2: 20 mmol/L — ABNORMAL LOW (ref 22–32)
Calcium: 9 mg/dL (ref 8.9–10.3)
Chloride: 103 mmol/L (ref 98–111)
Creatinine, Ser: 0.86 mg/dL (ref 0.44–1.00)
GFR, Estimated: >60 mL/min (ref >60)
Glucose, Bld: 162 mg/dL — ABNORMAL HIGH (ref 70–99)
Potassium: 3.8 mmol/L (ref 3.5–5.1)
Sodium: 136 mmol/L (ref 135–145)

## 2020-10-30 LAB — GLUCOSE, CAPILLARY
Glucose-Capillary: 224 mg/dL — ABNORMAL HIGH (ref 70–99)
Glucose-Capillary: 313 mg/dL — ABNORMAL HIGH (ref 70–99)
Glucose-Capillary: 86 mg/dL (ref 70–99)
Glucose-Capillary: 279 mg/dL — ABNORMAL HIGH (ref 70–99)

## 2020-10-30 MED ORDER — INSULIN NPH (HUMAN) (ISOPHANE) 100 UNIT/ML ~~LOC~~ SUSP
13.0000 [IU] | Freq: Two times a day (BID) | SUBCUTANEOUS | Status: DC
  Administered 2020-10-30 – 2020-10-31 (×2): 13 [IU] via SUBCUTANEOUS
  Filled 2020-10-30: qty 10

## 2020-10-30 NOTE — Progress Notes (Signed)
PROGRESS NOTE    Cheyenne [name]  [id number] DOB: October 15, 1950 DOA: 10/24/2020 PCP: Toni Arthurs, PA   Brief Narrative: 70 year old with past medical history significant for CVA 6 weeks prior to this admission with resultant left-sided hemiparesis, diabetes type 2 who was brought to the ED the night of admission due to increasing falls and generalized weakness at home.  Work-up in the ED was reassuring including radiographs to rule out fracture from falls and CT head without acute stroke of hemorrhage.  Her mentation appears somewhat confused which is not her baseline, hospitalist were consulted and the patient was admitted early for further evaluation.  Patient has had wide variation in blood glucose including symptomatic hypoglycemia down to 35.   MRI was positive for acute stroke.   Assessment & Plan:   Active Problems:   Generalized weakness   Hypertension   Type 2 diabetes mellitus with hyperlipidemia [facility])   Psychiatric problem   Cerebrovascular accident (CVA) ([place])   1-Acute Infarct in the Right Frontal white matter with surrounding punctate acute frontal infarct; Numerous supratentorial chronic micro hemorrhages, probably related to amyloid angiopathy.  MRI; 1.3 cm acute infarct in the right frontal white matter with surrounding punctate acute frontal infarcts. MRA; No large Vessel Occlusion.  On aspirin. Statins.  Awaiting Neurology evaluation. Added Dr Lily Lovings to treatment team. Left message.  LDL 71.  On aspirin prior to admission.  ECHO; no source of embolism.  Carotid doppler: Minor carotid atherosclerosis. Negative for stenosis. Degree of narrowing less than 50% bilaterally by ultrasound criteria. Appreciate Neurology assistance. Plan for plavix for one week them aspirin alone. Plavix started : 8/25. Cardiology consulted to help arrange Holter.  Awaiting SNF Stable.   2-DM type 2.  Hold metformin and glimepiride.  Had Hypoglycemic episodes.  Continue with  SSI.  A1c at 9.  Due to low Blood sugar patient was getting lower dose insulin Novolin at home, per daughter.  She will need lower insulin doses at discharge.  Change NPH to BID> increase dose today. CBG elevated.   History of CVA;  Continue with statins, aspirin.    HTN: Continue with Norvasc and losartan.  Estimated body mass index is 23.31 kg/m as calculated from the following:   Height as of this encounter: '5\' 4"'$  (1.626 m).   Weight as of this encounter: 61.6 kg.   DVT prophylaxis: Heparin Code Status: Full code Family Communication: grandaughter over phone 8/26.  Disposition Plan:  Status is: Inpatient  Remains inpatient appropriate because:IV treatments appropriate due to intensity of illness or inability to take PO  Dispo: The patient is from: Home              Anticipated d/c is to: SNF              Patient currently is medically stable to d/c.   Difficult to place patient No        Consultants:  Neurology.   Procedures:  ECHO  Antimicrobials:    Subjective: She is alert, today she was able to tell me she is at hospital.   Objective: Vitals:   10/29/20 1431 10/29/20 2206 10/30/20 0625 10/30/20 0826  BP: (!) 119/59 128/75 112/89 129/73  Pulse: 75 77 91   Resp: '18 19 19   '$ Temp: 97.7 F (36.5 C) 97.7 F (36.5 C) (!) 97.5 F (36.4 C)   TempSrc: Oral Oral Oral   SpO2: 96% 95% 96%   Weight:      Height:  Intake/Output Summary (Last 24 hours) at 10/30/2020 1408 Last data filed at 10/29/2020 1800 Gross per 24 hour  Intake 240 ml  Output --  Net 240 ml    Filed Weights   10/27/20 0500 10/28/20 0500 10/29/20 0500  Weight: 62.1 kg 62.2 kg 61.6 kg    Examination:  General exam: NAD Respiratory system: CTA Cardiovascular system:  S 1, S 2 RRR Gastrointestinal system: BS present, soft, nt Central nervous system:alert oriented, left side wekness Extremities: no edema    Data Reviewed: I have personally reviewed following labs and  imaging studies  CBC: Recent Labs  Lab 10/24/20 2058 10/25/20 0432 10/27/20 0558  WBC 9.4 10.1 6.2  NEUTROABS  --  7.6  --   HGB 16.4* 14.9 14.2  HCT 50.5* 45.4 42.9  MCV 88.9 89.2 87.9  PLT 276 257 [id number]    Basic Metabolic Panel: Recent Labs  Lab 10/24/20 2058 10/25/20 0432 10/27/20 0558 10/29/20 0746 10/30/20 0637  NA 137 138 138 135 136  K 4.2 3.9 3.7 3.8 3.8  CL 105 108 101 103 103  CO2 25 21* 25 25 20*  GLUCOSE 93 43* 126* 294* 162*  BUN '15 16 14 18 17  '$ CREATININE 0.90 0.74 0.81 0.83 0.86  CALCIUM 9.5 8.7* 9.3 8.8* 9.0  MG  --  2.1  --   --   --     GFR: Estimated Creatinine Clearance: 53.3 mL/min (by C-G formula based on SCr of 0.86 mg/dL). Liver Function Tests: Recent Labs  Lab 10/24/20 2058 10/25/20 0432  AST 16 15  ALT 13 12  ALKPHOS 86 78  BILITOT 0.7 0.7  PROT 8.1 7.2  ALBUMIN 4.0 3.6    No results for input(s): LIPASE, AMYLASE in the last 168 hours. Recent Labs  Lab 10/25/20 1024  AMMONIA 11    Coagulation Profile: No results for input(s): INR, PROTIME in the last 168 hours. Cardiac Enzymes: No results for input(s): CKTOTAL, CKMB, CKMBINDEX, TROPONINI in the last 168 hours. BNP (last 3 results) No results for input(s): PROBNP in the last 8760 hours. HbA1C: No results for input(s): HGBA1C in the last 72 hours.  CBG: Recent Labs  Lab 10/29/20 0757 10/29/20 1158 10/29/20 2206 10/30/20 0742 10/30/20 1119  GLUCAP 268* 204* 95 224* 313*    Lipid Profile: No results for input(s): CHOL, HDL, LDLCALC, TRIG, CHOLHDL, LDLDIRECT in the last 72 hours.  Thyroid Function Tests: No results for input(s): TSH, T4TOTAL, FREET4, T3FREE, THYROIDAB in the last 72 hours.  Anemia Panel: No results for input(s): VITAMINB12, FOLATE, FERRITIN, TIBC, IRON, RETICCTPCT in the last 72 hours. Sepsis Labs: No results for input(s): PROCALCITON, LATICACIDVEN in the last 168 hours.  Recent Results (from the past 240 hour(s))  SARS CORONAVIRUS 2 (TAT 6-24  HRS) Nasopharyngeal Nasopharyngeal Swab     Status: None   Collection Time: 10/25/20  2:37 AM   Specimen: Nasopharyngeal Swab  Result Value Ref Range Status   SARS Coronavirus 2 NEGATIVE NEGATIVE Final    Comment: (NOTE) SARS-CoV-2 target nucleic acids are NOT DETECTED.  The SARS-CoV-2 RNA is generally detectable in upper and lower respiratory specimens during the acute phase of infection. Negative results do not preclude SARS-CoV-2 infection, do not rule out co-infections with other pathogens, and should not be used as the sole basis for treatment or other patient management decisions. Negative results must be combined with clinical observations, patient history, and epidemiological information. The expected result is Negative.  Fact Sheet for Patients: SugarRoll.be  Fact Sheet for Healthcare Providers: https://www.woods-mathews.com/  This test is not yet approved or cleared by the [place] FDA and  has been authorized for detection and/or diagnosis of SARS-CoV-2 by FDA under an Emergency Use Authorization (EUA). This EUA will remain  in effect (meaning this test can be used) for the duration of the COVID-19 declaration under Se ction 564(b)(1) of the Act, 21 U.S.C. section 360bbb-3(b)(1), unless the authorization is terminated or revoked sooner.  Performed at [facility], [place] [address] [place], [place] [address]           Radiology Studies: No results found.      Scheduled Meds:   stroke: mapping our early stages of recovery book   Does not apply Once   amLODipine  10 mg Oral Daily   aspirin EC  81 mg Oral Daily   atorvastatin  40 mg Oral Daily   clopidogrel  75 mg Oral Q breakfast   escitalopram  10 mg Oral Daily   heparin  5,000 Units Subcutaneous Q8H   insulin aspart  0-5 Units Subcutaneous QHS   insulin aspart  0-9 Units Subcutaneous TID WC   insulin NPH Human  13 Units Subcutaneous BID AC & HS    loratadine  10 mg Oral Daily   losartan  50 mg Oral BID   nicotine  21 mg Transdermal Daily   rOPINIRole  0.25 mg Oral QHS   Continuous Infusions:   LOS: 5 days    Time spent: 35 minutes.      Elmarie Shiley, MD [facility]   If 7PM-7AM, please contact night-coverage [email or web address]  10/30/2020, 2:08 PM

## 2020-10-31 DIAGNOSIS — I639 Cerebral infarction, unspecified: Principal | ICD-10-CM

## 2020-10-31 LAB — RESP PANEL BY RT-PCR (FLU A&B, COVID) ARPGX2
Influenza A by PCR: NEGATIVE
Influenza B by PCR: NEGATIVE
SARS Coronavirus 2 by RT PCR: NEGATIVE

## 2020-10-31 LAB — GLUCOSE, CAPILLARY
Glucose-Capillary: 169 mg/dL — ABNORMAL HIGH (ref 70–99)
Glucose-Capillary: 228 mg/dL — ABNORMAL HIGH (ref 70–99)
Glucose-Capillary: 280 mg/dL — ABNORMAL HIGH (ref 70–99)

## 2020-10-31 LAB — VITAMIN B12: Vitamin B-12: 312 pg/mL (ref 180–914)

## 2020-10-31 MED ORDER — NICOTINE 21 MG/24HR TD PT24
21.0000 mg | MEDICATED_PATCH | Freq: Every day | TRANSDERMAL | 0 refills | Status: DC
Start: 2020-11-01 — End: 2021-01-27

## 2020-10-31 MED ORDER — CLOPIDOGREL BISULFATE 75 MG PO TABS: 75.0000 mg | ORAL_TABLET | Freq: Every day | ORAL | 0 refills | Status: DC

## 2020-10-31 MED ORDER — AMLODIPINE BESYLATE 10 MG PO TABS: 10.0000 mg | ORAL_TABLET | Freq: Every day | ORAL | 1 refills | Status: AC

## 2020-10-31 MED ORDER — INSULIN NPH (HUMAN) (ISOPHANE) 100 UNIT/ML ~~LOC~~ SUSP: 13.0000 [IU] | Freq: Two times a day (BID) | SUBCUTANEOUS | 11 refills | Status: DC

## 2020-10-31 NOTE — Progress Notes (Signed)
Nsg Discharge Note  Admit Date:  10/24/2020 Discharge date: 10/31/2020   RICKITA EARP to be D/C'd  [facility]  per MD order.  AVS completed.  Copy for chart, and copy for patient signed, and dated. Patient/caregiver able to verbalize understanding.  Discharge Medication: Allergies as of 10/31/2020       Reactions   Gabapentin    Bad dreams   Pregabalin    Drowsiness. Mind not clear.   Codeine Itching        Medication List     STOP taking these medications    dicyclomine 20 MG tablet Commonly known as: Bentyl   melatonin 5 MG Tabs   metroNIDAZOLE 500 MG tablet Commonly known as: Flagyl   ondansetron 4 MG disintegrating tablet Commonly known as: Zofran ODT   potassium chloride SA 20 MEQ tablet Commonly known as: KLOR-CON       TAKE these medications    albuterol 108 (90 Base) MCG/ACT inhaler Commonly known as: VENTOLIN HFA Inhale 1-2 puffs into the lungs every 6 (six) hours as needed for wheezing or shortness of breath.   alendronate 70 MG tablet Commonly known as: FOSAMAX Take 70 mg by mouth once a week. Wednesday   amLODipine 10 MG tablet Commonly known as: NORVASC Take 1 tablet (10 mg total) by mouth daily.   aspirin 81 MG EC tablet Take 1 tablet by mouth daily.   atorvastatin 40 MG tablet Commonly known as: LIPITOR Take 80 mg by mouth daily.   cholecalciferol 25 MCG (1000 UNIT) tablet Commonly known as: VITAMIN D3 Take 1,000 Units by mouth daily.   clonazePAM 0.5 MG tablet Commonly known as: KLONOPIN Take 0.5 mg by mouth 2 (two) times daily as needed.   clopidogrel 75 MG tablet Commonly known as: PLAVIX Take 1 tablet (75 mg total) by mouth daily with breakfast. Start taking on: November 01, 2020   escitalopram 10 MG tablet Commonly known as: LEXAPRO Take 10 mg by mouth daily.   glimepiride 1 MG tablet Commonly known as: AMARYL Take 1 mg by mouth daily with breakfast.   insulin NPH Human 100 UNIT/ML injection Commonly known as:  NOVOLIN N Inject 0.13 mLs (13 Units total) into the skin 2 (two) times daily at 8 am and 10 pm. What changed:  how much to take when to take this additional instructions   loratadine 10 MG tablet Commonly known as: CLARITIN Take 10 mg by mouth daily.   losartan 50 MG tablet Commonly known as: COZAAR Take 50 mg by mouth 2 (two) times daily.   nicotine 21 mg/24hr patch Commonly known as: NICODERM CQ - dosed in mg/24 hours Place 1 patch (21 mg total) onto the skin daily. Start taking on: November 01, 2020   rOPINIRole 0.25 MG tablet Commonly known as: REQUIP Take 0.25 mg by mouth at bedtime.        Discharge Assessment: Vitals:   10/31/20 0259 10/31/20 0458  BP: (!) 131/58 130/73  Pulse: 78 77  Resp:  17  Temp:  98 F (36.7 C)  SpO2:  97%   Skin clean, dry and intact without evidence of skin break down, no evidence of skin tears noted. IV catheter discontinued intact. Site without signs and symptoms of complications - no redness or edema noted at insertion site, patient denies c/o pain - only slight tenderness at site.  Dressing with slight pressure applied.  D/c Instructions-Education: Discharge instructions given to patient/family with verbalized understanding. D/c education completed with patient/family including  follow up instructions, medication list, d/c activities limitations if indicated, with other d/c instructions as indicated by MD - patient able to verbalize understanding, all questions fully answered. Patient instructed to return to ED, call 911, or call MD for any changes in condition.  Patient escorted via [place], and D/C home via private auto.  Dorcas Mcmurray, LPN [id number] PM

## 2020-10-31 NOTE — Progress Notes (Signed)
Physical Therapy Treatment Patient Details Name: Cheyenne [name] MRN: [id number] DOB: [birth year] Today's Date: 10/31/2020    History of Present Illness Cheyenne [name]  is a 70 y.o. female, with history of diabetes mellitus type 2, and stroke 6 weeks ago, presents the ED with a chief complaint of generalized weakness.  Patient has had 4 or 5 falls in the last 5 days.  She reports that today she landed on her face.  She did not have loss of consciousness.  She reports that she tripped and fell over her house slippers.  She did not have any chest pain, palpitations, nausea, presyncopal feeling, or diaphoresis prior to her fall.  She denies any new asymmetrical weakness.  She does have residual left arm and leg weakness from the stroke 6 weeks ago.  She has not had any trouble with vision, speaking, swallowing.  She does not have any change in her hearing either.  Patient reports that she has been excessively fatigued.  Initially the ED attempted to discharge this patient but the daughter feels strongly that we are missing something as she reports that she is worse than when she was admitted to the hospital 6 weeks ago.  That admission was at [facility].  At this time patient is not complaining of any pain.  Earlier in the shift she was complaining of low back pain.  Daughter reported that she is a little more confused than normal, but patient is alert and oriented for me.  Patient has no other complaints at this time.    PT Comments    Patient demonstrates good return for getting into/out of bed, increased endurance/distance for gait training, tends to drift left/right and had 1 episode of near fall due to bumping into nearby object and able to regain balance by leaning on wall.  Patient tolerated sitting up in chair after therapy - LPN notified.  Patient will benefit from continued physical therapy in hospital and recommended venue below to increase strength, balance, endurance for safe ADLs and gait.     Follow Up Recommendations  SNF;Supervision for mobility/OOB;Supervision - Intermittent     Equipment Recommendations  None recommended by PT    Recommendations for Other Services       Precautions / Restrictions Precautions Precautions: Fall Restrictions Weight Bearing Restrictions: No    Mobility  Bed Mobility Overal bed mobility: Needs Assistance Bed Mobility: Supine to Sit;Sit to Supine     Supine to sit: Supervision;Modified independent (Device/Increase time) Sit to supine: Modified independent (Device/Increase time);Supervision   General bed mobility comments: demonstrates good return for getting into/out of bed    Transfers   Equipment used: 1 person hand held assist;None Transfers: Sit to/from [facility] Sit to Stand: Supervision;Min guard Stand pivot transfers: Supervision;Min guard       General transfer comment: slightly unsteady, demonstrates increased BLE stregnth for completing sit to stands  Ambulation/Gait Ambulation/Gait assistance: Min guard Gait Distance (Feet): 75 Feet Assistive device: None;1 person hand held assist Gait Pattern/deviations: Decreased step length - right;Decreased step length - left;Decreased stride length;Drifts right/left Gait velocity: decreased   General Gait Details: slightly unsteady with occasiaonl bumping into nearby objects with near loss of balance due to drifting right/left   Stairs             Wheelchair Mobility    Modified Rankin (Stroke Patients Only)       Balance Overall balance assessment: Needs assistance Sitting-balance support: Feet unsupported;No upper extremity supported Sitting balance-Leahy Scale:  Good Sitting balance - Comments: seated at EOB   Standing balance support: During functional activity;No upper extremity supported Standing balance-Leahy Scale: Fair Standing balance comment: without AD                            Cognition Arousal/Alertness:  Awake/alert Behavior During Therapy: Restless;Anxious Overall Cognitive Status: No family/caregiver present to determine baseline cognitive functioning                                        Exercises      General Comments        Pertinent Vitals/Pain Pain Assessment: No/denies pain    Home Living                      Prior Function            PT Goals (current goals can now be found in the care plan section) Acute Rehab PT Goals Patient Stated Goal: return home PT Goal Formulation: With patient Time For Goal Achievement: 11/08/20 Potential to Achieve Goals: Good Progress towards PT goals: Progressing toward goals    Frequency    Min 3X/week      PT Plan Current plan remains appropriate    Co-evaluation              AM-PAC PT "6 Clicks" Mobility   Outcome Measure  Help needed turning from your back to your side while in a flat bed without using bedrails?: None Help needed moving from lying on your back to sitting on the side of a flat bed without using bedrails?: None Help needed moving to and from a bed to a chair (including a wheelchair)?: A Little Help needed standing up from a chair using your arms (e.g., wheelchair or bedside chair)?: A Little Help needed to walk in hospital room?: A Little Help needed climbing 3-5 steps with a railing? : A Lot 6 Click Score: 19    End of Session   Activity Tolerance: Patient tolerated treatment well;Patient limited by fatigue Patient left: in chair;with call bell/phone within reach Nurse Communication: Mobility status PT Visit Diagnosis: Unsteadiness on feet (R26.81);Other abnormalities of gait and mobility (R26.89);Muscle weakness (generalized) (M62.81)     Time: [id number] PT Time Calculation (min) (ACUTE ONLY): 20 min  Charges:  $Gait Training: 8-22 mins $Therapeutic Activity: 8-22 mins                     12:35 PM, 10/31/20 Lonell Grandchild, MPT Physical Therapist with  [facility] [phone] office [phone] mobile phone

## 2020-10-31 NOTE — Progress Notes (Signed)
Cardiology asked to arrange 30 monitor for recent CVA. Our office will arrange. Thanks

## 2020-10-31 NOTE — Progress Notes (Signed)
Has been confused and restless throughout night.  Rested some after prn klonopin.  Concerned about who brought her here and whether her kids know she is here.  Has been in chair and bed, back and forth all night.  Bed alarm and chair alarms set.

## 2020-10-31 NOTE — TOC Transition Note (Signed)
Transition of Care [facility]) - CM/SW Discharge Note   Patient Details  Name: Cheyenne [name] MRN: [id number] Date of Birth: Jan 03, 1951  Transition of Care [facility]) CM/SW Contact:  Natasha Bence, LCSW Phone Number: 10/31/2020, 2:11 PM   Clinical Narrative:    CSW notified of patient's readiness for discharge. CSW contacted Otila Kluver with [facility]. Otila Kluver agreeable to take patient. CSW completed med necessity and called EMS. Nurse to call report. TOC signing off.    Final next level of care: Skilled Nursing Facility Barriers to Discharge: Barriers Resolved   Patient Goals and CMS Choice Patient states their goals for this hospitalization and ongoing recovery are:: Rehab with SNF CMS Medicare.gov Compare Post Acute Care list provided to:: Patient    Discharge Placement                Patient to be transferred to facility by: [facility] Name of family member notified: Lanna Poche (Granddaughter)   [phone] Patient and family notified of of transfer: 10/31/20  Discharge Plan and Services                                     Social Determinants of Health (SDOH) Interventions     Readmission Risk Interventions No flowsheet data found.

## 2020-10-31 NOTE — Progress Notes (Signed)
Patient Discharged to [facility]. Report called and given to [facility] the caretaker.VSS.Transported by [place] transportation to awaiting facility.

## 2020-10-31 NOTE — Discharge Summary (Signed)
Physician Discharge Summary  Cheyenne [name] [id number] DOB: 1950-04-05 DOA: 10/24/2020  PCP: Toni Arthurs, PA  Admit date: 10/24/2020 Discharge date: 10/31/2020  Admitted From: Home  Disposition:  SNF  Recommendations for Outpatient Follow-up:  Follow up with PCP in 1-2 weeks Please obtain BMP/CBC in one week Last dose of plavix on 8/31. Please Make sure patient follow up with neurologist in 2-3 week, needs evaluation for dementia as well.  Please follow B 12 result.    Home Health: none  Discharge Condition: Stable.  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  Work-up in the ED was reassuring including radiographs to rule out fracture from falls and CT head without acute stroke of hemorrhage.  Her mentation appears somewhat confused which is not her baseline, hospitalist were consulted and the patient was admitted early for further evaluation.  Patient has had wide variation in blood glucose including symptomatic hypoglycemia down to 35.    MRI was positive for acute stroke.      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  Patient presents with confusion gait disturbance, fall.  MRI; 1.3 cm acute infarct in the right frontal white matter with surrounding punctate acute frontal infarcts. MRA; No large Vessel Occlusion.  On aspirin. Statins.  Awaiting Neurology evaluation. Added Dr Lily Lovings to treatment team. Left message.  LDL 71.  On aspirin prior to admission.  ECHO; no source of embolism.  Carotid doppler: Minor carotid atherosclerosis.  Negative for stenosis. Degree of narrowing less than 50% bilaterally by ultrasound criteria. Appreciate Neurology assistance. Plan for plavix for one week them aspirin alone. Plavix started : 8/25. Cardiology consulted to help arrange Holter.  Awaiting SNF. Stable for discharge,  Repeated CT head yesterday for confusion is stable. She is confuse today, delirium precaution.    2-DM type 2.  Hold metformin and glimepiride.  Had Hypoglycemic episodes.  Continue with SSI.  A1c at 9.  Due to low Blood sugar patient was getting lower dose insulin Novolin at home, per daughter.  She will need lower insulin doses at discharge.  Continue with NPH to BID> 13 units BID   History of CVA;  Continue with statins, aspirin.      HTN: Continue with Norvasc and losartan.   Estimated body mass index is 23.31 kg/m as calculated from the following:   Height as of this encounter: '5\' 4"'$  (1.626 m).   Weight as of this encounter: 61.6 kg.    Discharge Diagnoses:  Active Problems:   Generalized weakness   Hypertension   Type 2 diabetes mellitus with hyperlipidemia [facility])   Psychiatric problem   Cerebrovascular accident (CVA) [facility])    Discharge Instructions  Discharge Instructions     Diet - low sodium heart healthy   Complete by: As directed    Increase activity slowly   Complete by: As directed       Allergies as of 10/31/2020       Reactions   Gabapentin    Bad dreams   Pregabalin    Drowsiness. Mind not clear.   Codeine Itching        Medication List  STOP taking these medications    dicyclomine 20 MG tablet Commonly known as: Bentyl   melatonin 5 MG Tabs   metroNIDAZOLE 500 MG tablet Commonly known as: Flagyl   ondansetron 4 MG disintegrating tablet Commonly known as: Zofran ODT   potassium chloride SA 20 MEQ tablet Commonly known as: KLOR-CON       TAKE these medications    albuterol 108 (90 Base) MCG/ACT inhaler Commonly known as: VENTOLIN HFA Inhale  1-2 puffs into the lungs every 6 (six) hours as needed for wheezing or shortness of breath.   alendronate 70 MG tablet Commonly known as: FOSAMAX Take 70 mg by mouth once a week. Wednesday   amLODipine 10 MG tablet Commonly known as: NORVASC Take 1 tablet (10 mg total) by mouth daily.   aspirin 81 MG EC tablet Take 1 tablet by mouth daily.   atorvastatin 40 MG tablet Commonly known as: LIPITOR Take 80 mg by mouth daily.   cholecalciferol 25 MCG (1000 UNIT) tablet Commonly known as: VITAMIN D3 Take 1,000 Units by mouth daily.   clonazePAM 0.5 MG tablet Commonly known as: KLONOPIN Take 0.5 mg by mouth 2 (two) times daily as needed.   clopidogrel 75 MG tablet Commonly known as: PLAVIX Take 1 tablet (75 mg total) by mouth daily with breakfast. Start taking on: November 01, 2020   escitalopram 10 MG tablet Commonly known as: LEXAPRO Take 10 mg by mouth daily.   glimepiride 1 MG tablet Commonly known as: AMARYL Take 1 mg by mouth daily with breakfast.   insulin NPH Human 100 UNIT/ML injection Commonly known as: NOVOLIN N Inject 0.13 mLs (13 Units total) into the skin 2 (two) times daily at 8 am and 10 pm. What changed:  how much to take when to take this additional instructions   loratadine 10 MG tablet Commonly known as: CLARITIN Take 10 mg by mouth daily.   losartan 50 MG tablet Commonly known as: COZAAR Take 50 mg by mouth 2 (two) times daily.   nicotine 21 mg/24hr patch Commonly known as: NICODERM CQ - dosed in mg/24 hours Place 1 patch (21 mg total) onto the skin daily. Start taking on: November 01, 2020   rOPINIRole 0.25 MG tablet Commonly known as: REQUIP Take 0.25 mg by mouth at bedtime.        Allergies  Allergen Reactions   Gabapentin     Bad dreams   Pregabalin     Drowsiness. Mind not clear.   Codeine Itching    Consultations: Neurology   Procedures/Studies: DG Ribs Unilateral W/Chest Left  Result Date: 10/24/2020 CLINICAL DATA:   Fall and left rib pain. EXAM: LEFT RIBS AND CHEST - 3+ VIEW COMPARISON:  Chest radiograph dated 11/04/2012. FINDINGS: There is diffuse chronic interstitial coarsening. No focal consolidation, pleural effusion, or pneumothorax. The cardiac silhouette is within limits. Osteopenia with degenerative changes of the spine. No acute osseous pathology. No displaced rib fractures. IMPRESSION: 1. No acute cardiopulmonary process. 2. No displaced rib fractures. Electronically Signed   By: Anner Crete M.D.   On: 10/24/2020 22:36   DG Lumbar Spine Complete  Result Date: 10/24/2020 CLINICAL DATA:  Fall, left lower rib pain, low back pain EXAM: LUMBAR SPINE - COMPLETE 4+ VIEW COMPARISON:  None. FINDINGS: Advanced degenerative disc disease changes at L4-5 with disc space loss, vacuum disc and spurring. Degenerative facet disease in the lumbar spine. Normal alignment. No fracture. Diffuse osteopenia. IMPRESSION: No acute bony abnormality. Electronically Signed   By: Lennette Bihari  Dover M.D.   On: 10/24/2020 22:34   CT HEAD WO CONTRAST (5MM)  Result Date: 10/30/2020 CLINICAL DATA:  Encephalopathy EXAM: CT HEAD WITHOUT CONTRAST TECHNIQUE: Contiguous axial images were obtained from the base of the skull through the vertex without intravenous contrast. COMPARISON:  Head CT 10/24/2020 and brain MRI 10/25/2020 FINDINGS: Brain: There is no mass, hemorrhage or extra-axial collection. There is generalized atrophy without lobar predilection. Hypodensity of the white matter is most commonly associated with chronic microvascular disease. Focal hypoattenuation at the site of known posterior right frontal lesion. Vascular: No abnormal hyperdensity of the major intracranial arteries or dural venous sinuses. No intracranial atherosclerosis. Skull: The visualized skull base, calvarium and extracranial soft tissues are normal. Sinuses/Orbits: Moderate sphenoid sinus mucosal thickening. The orbits are normal. IMPRESSION: 1. No acute intracranial  abnormality. 2. Focal hypoattenuation at the site of known posterior right frontal lesion, likely subacute infarct. Electronically Signed   By: Ulyses Jarred M.D.   On: 10/30/2020 19:16   CT Head Wo Contrast  Result Date: 10/24/2020 CLINICAL DATA:  Head trauma, minor (Age >= 65y) recent stroke affecting L arm/leg at [facility], now falling EXAM: CT HEAD WITHOUT CONTRAST TECHNIQUE: Contiguous axial images were obtained from the base of the skull through the vertex without intravenous contrast. COMPARISON:  None. FINDINGS: Brain: Patchy low-density throughout the deep white matter, likely chronic small vessel disease. No acute intracranial abnormality. Specifically, no hemorrhage, hydrocephalus, mass lesion, acute infarction, or significant intracranial injury. Vascular: No hyperdense vessel or unexpected calcification. Skull: No acute calvarial abnormality. Sinuses/Orbits: Mucosal thickening throughout the paranasal sinuses, most pronounced in the sphenoid sinuses. Other: None IMPRESSION: Chronic small vessel disease. No acute intracranial abnormality. Chronic sinusitis. Electronically Signed   By: Rolm Baptise M.D.   On: 10/24/2020 22:27   MR ANGIO HEAD WO CONTRAST  Result Date: 10/25/2020 CLINICAL DATA:  Neuro deficit, acute, stroke suspected EXAM: MRI HEAD WITHOUT CONTRAST MRA HEAD WITHOUT CONTRAST TECHNIQUE: Multiplanar, multi-echo pulse sequences of the brain and surrounding structures were acquired without intravenous contrast. Angiographic images of the Circle of Willis were acquired using MRA technique without intravenous contrast. COMPARISON:  No pertinent prior exam. FINDINGS: MRI HEAD FINDINGS Brain: Approximately 1.3 cm focus of peripheral restricted diffusion in the right posterior frontal white matter, most consistent with acute infarct given the patient's risk factors and other findings of chronic microvascular disease. Additional punctate areas of infarct in the more anterior frontal white matter.  Moderate to advanced patchy and T2 hyperintensity in the supratentorial white matter. There are numerous small foci of susceptibility artifact throughout the supratentorial brain, predominantly cortical in location and compatible with prior microhemorrhages. No evidence of acute hemorrhage, mass lesion, hydrocephalus, or extra-axial fluid collection. Vascular: See below. Skull and upper cervical spine: Normal marrow signal. Sinuses/Orbits: Moderate mucosal thickening in the right sphenoid sinus with air-fluid level. Other: No sizable mastoid effusions. MRA HEAD FINDINGS Anterior circulation: Bilateral intracranial ICAs, MCAs, and ACAs are patent without proximal hemodynamically significant stenosis. No aneurysm identified. Posterior circulation: Bilateral intradural vertebral arteries, basilar artery, and posterior cerebral arteries are patent. Moderate mid right and severe distal left P2 PCA stenosis. IMPRESSION: MRI: 1. Approximately 1.3 cm acute infarct in the right frontal white matter with surrounding punctate acute frontal infarcts. 2. Moderate to advanced T2/FLAIR hyperintensities the white matter. 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 3. Numerous supratentorial chronic microhemorrhages, potentially related to amyloid angiopathy given the  cortical distribution. These hemorrhages are atypical for hypertensive hemorrhages given relative sparing of the basal ganglia, thalami and cerebellum. 4. Moderate mucosal thickening in the right sphenoid sinus with air-fluid level. MRA: 1. No large vessel occlusion. 2. Motion limited distal evaluation with suspected severe distal left P2 PCA and moderate mid right P2 PCA stenosis. Electronically Signed   By: Margaretha Sheffield M.D.   On: 10/25/2020 12:57   MR BRAIN WO CONTRAST  Result Date: 10/25/2020 CLINICAL DATA:  Neuro deficit,  acute, stroke suspected EXAM: MRI HEAD WITHOUT CONTRAST MRA HEAD WITHOUT CONTRAST TECHNIQUE: Multiplanar, multi-echo pulse sequences of the brain and surrounding structures were acquired without intravenous contrast. Angiographic images of the Circle of Willis were acquired using MRA technique without intravenous contrast. COMPARISON:  No pertinent prior exam. FINDINGS: MRI HEAD FINDINGS Brain: Approximately 1.3 cm focus of peripheral restricted diffusion in the right posterior frontal white matter, most consistent with acute infarct given the patient's risk factors and other findings of chronic microvascular disease. Additional punctate areas of infarct in the more anterior frontal white matter. Moderate to advanced patchy and T2 hyperintensity in the supratentorial white matter. There are numerous small foci of susceptibility artifact throughout the supratentorial brain, predominantly cortical in location and compatible with prior microhemorrhages. No evidence of acute hemorrhage, mass lesion, hydrocephalus, or extra-axial fluid collection. Vascular: See below. Skull and upper cervical spine: Normal marrow signal. Sinuses/Orbits: Moderate mucosal thickening in the right sphenoid sinus with air-fluid level. Other: No sizable mastoid effusions. MRA HEAD FINDINGS Anterior circulation: Bilateral intracranial ICAs, MCAs, and ACAs are patent without proximal hemodynamically significant stenosis. No aneurysm identified. Posterior circulation: Bilateral intradural vertebral arteries, basilar artery, and posterior cerebral arteries are patent. Moderate mid right and severe distal left P2 PCA stenosis. IMPRESSION: MRI: 1. Approximately 1.3 cm acute infarct in the right frontal white matter with surrounding punctate acute frontal infarcts. 2. Moderate to advanced T2/FLAIR hyperintensities the white matter. While nonspecific, findings most likely represent age-advanced chronic microvascular ischemic disease given the  patient's known risk factors (including diabetes and hypertension). 3. 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 These hemorrhages are atypical for hypertensive hemorrhages given relative sparing of the basal ganglia, thalami and cerebellum. 4. Moderate mucosal thickening in the right sphenoid sinus with air-fluid level. MRA: 1. No large vessel occlusion. 2. Motion limited distal evaluation with suspected severe distal left P2 PCA and moderate mid right P2 PCA stenosis. Electronically Signed   By: Margaretha Sheffield M.D.   On: 10/25/2020 12:57   US Carotid Bilateral  Result Date: 10/28/2020 CLINICAL DATA:  Previous stroke, recent falls, left-sided weakness EXAM: BILATERAL CAROTID DUPLEX ULTRASOUND TECHNIQUE: Pearline Cables scale imaging, color Doppler and duplex ultrasound were performed of bilateral carotid and vertebral arteries in the neck. COMPARISON:  None. FINDINGS: Criteria: Quantification of carotid stenosis is based on velocity parameters that correlate the residual internal carotid diameter with NASCET-based stenosis levels, using the diameter of the distal internal carotid lumen as the denominator for stenosis measurement. The following velocity measurements were obtained: RIGHT ICA: 113/24 cm/sec CCA: [id number] cm/sec SYSTOLIC ICA/CCA RATIO:  1.2 ECA: 132 cm/sec LEFT ICA: 98/19 cm/sec CCA: [id number] cm/sec SYSTOLIC ICA/CCA RATIO:  1.1 ECA: 116 cm/sec RIGHT CAROTID ARTERY: Minor echogenic shadowing plaque formation. No hemodynamically significant right ICA stenosis, velocity elevation, or turbulent flow. Degree of narrowing less than 50%. RIGHT VERTEBRAL ARTERY:  Normal antegrade flow LEFT CAROTID ARTERY: Similar scattered minor echogenic plaque formation. No hemodynamically significant left ICA  stenosis, velocity elevation, or turbulent flow. LEFT VERTEBRAL ARTERY:  Normal antegrade flow IMPRESSION: Minor carotid atherosclerosis. Negative  for stenosis. Degree of narrowing less than 50% bilaterally by ultrasound criteria. Patent antegrade vertebral flow bilaterally Electronically Signed   By: Jerilynn Mages.  Shick M.D.   On: 10/28/2020 11:38   ECHOCARDIOGRAM COMPLETE  Result Date: 10/25/2020    ECHOCARDIOGRAM REPORT   Patient Name:   YOMARI EASLER Date of Exam: 10/25/2020 Medical Rec #:  [id number]        Height:       64.0 in Accession #:    [id number]       Weight:       136.0 lb Date of Birth:  1950-12-08        BSA:          1.661 m Patient Age:    70 years         BP:           145/99 mmHg Patient Gender: F                HR:           82 bpm. Exam Location:  [facility] Procedure: 2D Echo, Cardiac Doppler, Color Doppler and Intracardiac            Opacification Agent Indications:    CVA  History:        Patient has no prior history of Echocardiogram examinations.                 Stroke and COPD; Risk Factors:Diabetes, Current Smoker and                 Hypertension.  Sonographer:    Dustin Flock RDCS Referring Phys: [id number] ASIA B [place]  Sonographer Comments: Technically difficult study due to poor echo windows. Image acquisition challenging due to COPD. IMPRESSIONS  1. Left ventricular ejection fraction, by estimation, is 60 to 65%. The left ventricle has normal function. The left ventricle has no regional wall motion abnormalities. Left ventricular diastolic parameters are indeterminate.  2. Right ventricular systolic function is normal. The right ventricular size is normal. Tricuspid regurgitation signal is inadequate for assessing PA pressure.  3. The mitral valve is normal in structure. No evidence of mitral valve regurgitation. No evidence of mitral stenosis.  4. The aortic valve was not well visualized. Aortic valve regurgitation is not visualized. No aortic stenosis is present.  5. 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 FINDINGS  Left Ventricle: Left ventricular ejection fraction,  by estimation, is 60 to 65%. The left ventricle has normal function. The left ventricle has no regional wall motion abnormalities. Definity contrast agent was given IV to delineate the left ventricular  endocardial borders. The left ventricular internal cavity size was normal in size. There is no left ventricular hypertrophy. Left ventricular diastolic parameters are indeterminate. Right Ventricle: The right ventricular size is normal. No increase in right ventricular wall thickness. Right ventricular systolic function is normal. Tricuspid regurgitation signal is inadequate for assessing PA pressure. Left Atrium: Left atrial size was normal in size. Right Atrium: Right atrial size was not well visualized. Pericardium: There is no evidence of pericardial effusion. Mitral Valve: The mitral valve is normal in structure. No evidence of mitral valve regurgitation. No evidence of mitral valve stenosis. Tricuspid Valve: The tricuspid valve is not well visualized. Tricuspid valve regurgitation is not demonstrated. No evidence of tricuspid stenosis. Aortic Valve: The aortic  valve was not well visualized. Aortic valve regurgitation is not visualized. No aortic stenosis is present. Aortic valve mean gradient measures 1.7 mmHg. Aortic valve peak gradient measures 2.9 mmHg. Aortic valve area, by VTI measures 2.92 cm. Pulmonic Valve: The pulmonic valve was not well visualized. Pulmonic valve regurgitation is not visualized. No evidence of pulmonic stenosis. Aorta: The aortic root is normal in size and structure. 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  LEFT VENTRICLE PLAX 2D LVIDd:         3.57 cm  Diastology LVIDs:         2.19 cm  LV e' medial:    4.12 cm/s LV PW:         0.98 cm  LV E/e' medial:  11.8 LV IVS:        0.96 cm  LV e' lateral:   7.36 cm/s LVOT diam:     2.00 cm  LV E/e' lateral: 6.6 LV SV:         50 LV SV Index:   30 LVOT Area:     3.14 cm  RIGHT  VENTRICLE RV Basal diam:  3.20 cm TAPSE (M-mode): 2.8 cm LEFT ATRIUM             Index       RIGHT ATRIUM           Index LA diam:        2.40 cm 1.45 cm/m  RA Area:     10.00 cm LA Vol (A2C):   27.4 ml 16.50 ml/m RA Volume:   20.10 ml  12.10 ml/m LA Vol (A4C):   36.1 ml 21.74 ml/m LA Biplane Vol: 33.4 ml 20.11 ml/m  AORTIC VALVE AV Area (Vmax):    2.99 cm AV Area (Vmean):   2.66 cm AV Area (VTI):     2.92 cm AV Vmax:           84.55 cm/s AV Vmean:          63.507 cm/s AV VTI:            0.170 m AV Peak Grad:      2.9 mmHg AV Mean Grad:      1.7 mmHg LVOT Vmax:         80.50 cm/s LVOT Vmean:        53.800 cm/s LVOT VTI:          0.158 m LVOT/AV VTI ratio: 0.93  AORTA Ao Root diam: 2.60 cm MITRAL VALVE MV Area (PHT): 3.50 cm    SHUNTS MV Decel Time: 217 msec    Systemic VTI:  0.16 m MV E velocity: 48.50 cm/s  Systemic Diam: 2.00 cm MV A velocity: 59.70 cm/s MV E/A ratio:  0.81 Carlyle Dolly MD Electronically signed by Carlyle Dolly MD Signature Date/Time: 10/25/2020/12:18:12 PM    Final      Subjective: She is sleepy this am, didn't sleep well last night.  Subsequently she wake up and has been confuse.   Discharge Exam: Vitals:   10/31/20 0259 10/31/20 0458  BP: (!) 131/58 130/73  Pulse: 78 77  Resp:  17  Temp:  98 F (36.7 C)  SpO2:  97%     General: Pt is alert, awake, not in acute distress Cardiovascular: RRR, S1/S2 +, no rubs, no gallops Respiratory: CTA bilaterally, no wheezing, no rhonchi Abdominal: Soft, NT, ND, bowel sounds + Extremities: no edema, no cyanosis    The results  of significant diagnostics from this hospitalization (including imaging, microbiology, ancillary and laboratory) are listed below for reference.     Microbiology: Recent Results (from the past 240 hour(s))  SARS CORONAVIRUS 2 (TAT 6-24 HRS) Nasopharyngeal Nasopharyngeal Swab     Status: None   Collection Time: 10/25/20  2:37 AM   Specimen: Nasopharyngeal Swab  Result Value Ref Range Status    SARS Coronavirus 2 NEGATIVE NEGATIVE Final    Comment: (NOTE) SARS-CoV-2 target nucleic acids are NOT DETECTED.  The SARS-CoV-2 RNA is generally detectable in upper and lower respiratory specimens during the acute phase of infection. Negative results do not preclude SARS-CoV-2 infection, do not rule out co-infections with other pathogens, and should not be used as the sole basis for treatment or other patient management decisions. Negative results must be combined with clinical observations, patient history, and epidemiological information. The expected result is Negative.  Fact Sheet for Patients: SugarRoll.be  Fact Sheet for Healthcare Providers: https://www.woods-mathews.com/  This test is not yet approved or cleared by the [place] FDA and  has been authorized for detection and/or diagnosis of SARS-CoV-2 by FDA under an Emergency Use Authorization (EUA). This EUA will remain  in effect (meaning this test can be used) for the duration of the COVID-19 declaration under Se ction 564(b)(1) of the Act, 21 U.S.C. section 360bbb-3(b)(1), unless the authorization is terminated or revoked sooner.  Performed at [facility], [place] [address], [place], [place] [address]      Labs: BNP (last 3 results) No results for input(s): BNP in the last 8760 hours. Basic Metabolic Panel: Recent Labs  Lab 10/24/20 2058 10/25/20 0432 10/27/20 0558 10/29/20 0746 10/30/20 0637  NA 137 138 138 135 136  K 4.2 3.9 3.7 3.8 3.8  CL 105 108 101 103 103  CO2 25 21* 25 25 20*  GLUCOSE 93 43* 126* 294* 162*  BUN '15 16 14 18 17  '$ CREATININE 0.90 0.74 0.81 0.83 0.86  CALCIUM 9.5 8.7* 9.3 8.8* 9.0  MG  --  2.1  --   --   --    Liver Function Tests: Recent Labs  Lab 10/24/20 2058 10/25/20 0432  AST 16 15  ALT 13 12  ALKPHOS 86 78  BILITOT 0.7 0.7  PROT 8.1 7.2  ALBUMIN 4.0 3.6   No results for input(s): LIPASE, AMYLASE in the last 168  hours. Recent Labs  Lab 10/25/20 1024  AMMONIA 11   CBC: Recent Labs  Lab 10/24/20 2058 10/25/20 0432 10/27/20 0558  WBC 9.4 10.1 6.2  NEUTROABS  --  7.6  --   HGB 16.4* 14.9 14.2  HCT 50.5* 45.4 42.9  MCV 88.9 89.2 87.9  PLT 276 257 220   Cardiac Enzymes: No results for input(s): CKTOTAL, CKMB, CKMBINDEX, TROPONINI in the last 168 hours. BNP: Invalid input(s): POCBNP CBG: Recent Labs  Lab 10/30/20 0742 10/30/20 1119 10/30/20 1609 10/30/20 2121 10/31/20 0745  GLUCAP 224* 313* 86 279* 169*   D-Dimer No results for input(s): DDIMER in the last 72 hours. Hgb A1c No results for input(s): HGBA1C in the last 72 hours. Lipid Profile No results for input(s): CHOL, HDL, LDLCALC, TRIG, CHOLHDL, LDLDIRECT in the last 72 hours. Thyroid function studies No results for input(s): TSH, T4TOTAL, T3FREE, THYROIDAB in the last 72 hours.  Invalid input(s): FREET3 Anemia work up No results for input(s): VITAMINB12, FOLATE, FERRITIN, TIBC, IRON, RETICCTPCT in the last 72 hours. Urinalysis    Component Value Date/Time   COLORURINE  YELLOW 10/24/2020 2318   APPEARANCEUR CLEAR 10/24/2020 2318   APPEARANCEUR Clear 03/20/2014 2149   LABSPEC 1.017 10/24/2020 2318   LABSPEC 1.005 03/20/2014 2149   PHURINE 5.0 10/24/2020 2318   GLUCOSEU 50 (A) 10/24/2020 2318   GLUCOSEU Negative 03/20/2014 2149   HGBUR NEGATIVE 10/24/2020 2318   [place] NEGATIVE 10/24/2020 2318   BILIRUBINUR Negative 03/20/2014 2149   KETONESUR NEGATIVE 10/24/2020 2318   PROTEINUR NEGATIVE 10/24/2020 2318   NITRITE NEGATIVE 10/24/2020 2318   LEUKOCYTESUR NEGATIVE 10/24/2020 2318   LEUKOCYTESUR Negative 03/20/2014 2149   Sepsis Labs Invalid input(s): PROCALCITONIN,  WBC,  LACTICIDVEN Microbiology Recent Results (from the past 240 hour(s))  SARS CORONAVIRUS 2 (TAT 6-24 HRS) Nasopharyngeal Nasopharyngeal Swab     Status: None   Collection Time: 10/25/20  2:37 AM   Specimen: Nasopharyngeal Swab  Result Value  Ref Range Status   SARS Coronavirus 2 NEGATIVE NEGATIVE Final    Comment: (NOTE) SARS-CoV-2 target nucleic acids are NOT DETECTED.  The SARS-CoV-2 RNA is generally detectable in upper and lower respiratory specimens during the acute phase of infection. Negative results do not preclude SARS-CoV-2 infection, do not rule out co-infections with other pathogens, and should not be used as the sole basis for treatment or other patient management decisions. Negative results must be combined with clinical observations, patient history, and epidemiological information. The expected result is Negative.  Fact Sheet for Patients: SugarRoll.be  Fact Sheet for Healthcare Providers: https://www.woods-mathews.com/  This test is not yet approved or cleared by the [place] FDA and  has been authorized for detection and/or diagnosis of SARS-CoV-2 by FDA under an Emergency Use Authorization (EUA). This EUA will remain  in effect (meaning this test can be used) for the duration of the COVID-19 declaration under Se ction 564(b)(1) of the Act, 21 U.S.C. section 360bbb-3(b)(1), unless the authorization is terminated or revoked sooner.  Performed at [facility], [place] [address], [place], [place] [address]      Time coordinating discharge: 40 minutes  SIGNED:   Elmarie Shiley, MD  [facility]

## 2020-11-09 ENCOUNTER — Other Ambulatory Visit: Payer: Self-pay

## 2020-11-09 ENCOUNTER — Emergency Department (HOSPITAL_COMMUNITY)
Admission: EM | Admit: 2020-11-09 | Discharge: 2020-11-09 | Disposition: A | Discharge status: Home / Self Care | Payer: Medicare HMO | Source: Home / Self Care

## 2020-11-09 ENCOUNTER — Encounter (HOSPITAL_COMMUNITY): Payer: Self-pay

## 2020-11-09 DIAGNOSIS — R1030 Lower abdominal pain, unspecified: Secondary | ICD-10-CM | POA: Insufficient documentation

## 2020-11-09 DIAGNOSIS — R1110 Vomiting, unspecified: Secondary | ICD-10-CM | POA: Insufficient documentation

## 2020-11-09 DIAGNOSIS — Z5321 Procedure and treatment not carried out due to patient leaving prior to being seen by health care provider: Secondary | ICD-10-CM | POA: Insufficient documentation

## 2020-11-09 DIAGNOSIS — A419 Sepsis, unspecified organism: Secondary | ICD-10-CM | POA: Diagnosis not present

## 2020-11-09 DIAGNOSIS — K529 Noninfective gastroenteritis and colitis, unspecified: Secondary | ICD-10-CM | POA: Diagnosis not present

## 2020-11-09 LAB — LIPASE, BLOOD: Lipase: 20 U/L (ref 11–51)

## 2020-11-09 LAB — CBC
HCT: 43.8 % (ref 36.0–46.0)
Hemoglobin: 14.5 g/dL (ref 12.0–15.0)
MCH: 28.9 pg (ref 26.0–34.0)
MCHC: 33.1 g/dL (ref 30.0–36.0)
MCV: 87.3 fL (ref 80.0–100.0)
Platelets: 331 10*3/uL (ref 150–400)
RBC: 5.02 MIL/uL (ref 3.87–5.11)
RDW: 13.9 % (ref 11.5–15.5)
WBC: 16.6 10*3/uL — ABNORMAL HIGH (ref 4.0–10.5)
nRBC: 0 % (ref 0.0–0.2)

## 2020-11-09 LAB — COMPREHENSIVE METABOLIC PANEL
ALT: 21 U/L (ref 0–44)
AST: 15 U/L (ref 15–41)
Albumin: 3.9 g/dL (ref 3.5–5.0)
Alkaline Phosphatase: 79 U/L (ref 38–126)
Anion gap: 10 (ref 5–15)
BUN: 22 mg/dL (ref 8–23)
CO2: 24 mmol/L (ref 22–32)
Calcium: 9.4 mg/dL (ref 8.9–10.3)
Chloride: 102 mmol/L (ref 98–111)
Creatinine, Ser: 1.46 mg/dL — ABNORMAL HIGH (ref 0.44–1.00)
GFR, Estimated: 39 mL/min — ABNORMAL LOW (ref >60)
Glucose, Bld: 357 mg/dL — ABNORMAL HIGH (ref 70–99)
Potassium: 4.1 mmol/L (ref 3.5–5.1)
Sodium: 136 mmol/L (ref 135–145)
Total Bilirubin: 0.5 mg/dL (ref 0.3–1.2)
Total Protein: 7.2 g/dL (ref 6.5–8.1)

## 2020-11-09 NOTE — ED Triage Notes (Signed)
Pt to er via ems, per ems pt is here for abd pain, states that she just got out of rehab for a stroke.  Pt states that she is here for lower abd pain that started this am.  Pt reports some vomiting, denies diarrhea.

## 2020-11-10 ENCOUNTER — Other Ambulatory Visit: Payer: Self-pay

## 2020-11-10 ENCOUNTER — Encounter (HOSPITAL_COMMUNITY): Payer: Self-pay | Admitting: Emergency Medicine

## 2020-11-10 ENCOUNTER — Inpatient Hospital Stay (HOSPITAL_COMMUNITY)
Admission: EM | Admit: 2020-11-10 | Discharge: 2020-11-22 | DRG: 871 | Disposition: A | Discharge status: Skilled Nursing Facility | Payer: Medicare HMO | Attending: Family Medicine | Admitting: Family Medicine

## 2020-11-10 ENCOUNTER — Emergency Department (HOSPITAL_COMMUNITY): Payer: Medicare HMO

## 2020-11-10 DIAGNOSIS — Z888 Allergy status to other drugs, medicaments and biological substances status: Secondary | ICD-10-CM | POA: Diagnosis not present

## 2020-11-10 DIAGNOSIS — Z23 Encounter for immunization: Secondary | ICD-10-CM

## 2020-11-10 DIAGNOSIS — Z8673 Personal history of transient ischemic attack (TIA), and cerebral infarction without residual deficits: Secondary | ICD-10-CM

## 2020-11-10 DIAGNOSIS — E1165 Type 2 diabetes mellitus with hyperglycemia: Secondary | ICD-10-CM | POA: Diagnosis present

## 2020-11-10 DIAGNOSIS — Z20822 Contact with and (suspected) exposure to covid-19: Secondary | ICD-10-CM | POA: Diagnosis present

## 2020-11-10 DIAGNOSIS — I639 Cerebral infarction, unspecified: Secondary | ICD-10-CM | POA: Diagnosis present

## 2020-11-10 DIAGNOSIS — E559 Vitamin D deficiency, unspecified: Secondary | ICD-10-CM

## 2020-11-10 DIAGNOSIS — Z8542 Personal history of malignant neoplasm of other parts of uterus: Secondary | ICD-10-CM

## 2020-11-10 DIAGNOSIS — E876 Hypokalemia: Secondary | ICD-10-CM | POA: Diagnosis not present

## 2020-11-10 DIAGNOSIS — Z7902 Long term (current) use of antithrombotics/antiplatelets: Secondary | ICD-10-CM | POA: Diagnosis not present

## 2020-11-10 DIAGNOSIS — Z720 Tobacco use: Secondary | ICD-10-CM

## 2020-11-10 DIAGNOSIS — D72829 Elevated white blood cell count, unspecified: Secondary | ICD-10-CM | POA: Diagnosis not present

## 2020-11-10 DIAGNOSIS — Z7982 Long term (current) use of aspirin: Secondary | ICD-10-CM | POA: Diagnosis not present

## 2020-11-10 DIAGNOSIS — T383X5A Adverse effect of insulin and oral hypoglycemic [antidiabetic] drugs, initial encounter: Secondary | ICD-10-CM | POA: Diagnosis not present

## 2020-11-10 DIAGNOSIS — E782 Mixed hyperlipidemia: Secondary | ICD-10-CM | POA: Diagnosis present

## 2020-11-10 DIAGNOSIS — F17210 Nicotine dependence, cigarettes, uncomplicated: Secondary | ICD-10-CM | POA: Diagnosis present

## 2020-11-10 DIAGNOSIS — R109 Unspecified abdominal pain: Secondary | ICD-10-CM

## 2020-11-10 DIAGNOSIS — Y92239 Unspecified place in hospital as the place of occurrence of the external cause: Secondary | ICD-10-CM | POA: Diagnosis not present

## 2020-11-10 DIAGNOSIS — Z6824 Body mass index (BMI) 24.0-24.9, adult: Secondary | ICD-10-CM

## 2020-11-10 DIAGNOSIS — E43 Unspecified severe protein-calorie malnutrition: Secondary | ICD-10-CM | POA: Diagnosis present

## 2020-11-10 DIAGNOSIS — G2581 Restless legs syndrome: Secondary | ICD-10-CM | POA: Diagnosis present

## 2020-11-10 DIAGNOSIS — R1030 Lower abdominal pain, unspecified: Secondary | ICD-10-CM

## 2020-11-10 DIAGNOSIS — Z79899 Other long term (current) drug therapy: Secondary | ICD-10-CM | POA: Diagnosis not present

## 2020-11-10 DIAGNOSIS — R112 Nausea with vomiting, unspecified: Secondary | ICD-10-CM | POA: Diagnosis not present

## 2020-11-10 DIAGNOSIS — R188 Other ascites: Secondary | ICD-10-CM | POA: Diagnosis present

## 2020-11-10 DIAGNOSIS — E11649 Type 2 diabetes mellitus with hypoglycemia without coma: Secondary | ICD-10-CM | POA: Diagnosis not present

## 2020-11-10 DIAGNOSIS — M4800 Spinal stenosis, site unspecified: Secondary | ICD-10-CM | POA: Diagnosis present

## 2020-11-10 DIAGNOSIS — J449 Chronic obstructive pulmonary disease, unspecified: Secondary | ICD-10-CM | POA: Diagnosis present

## 2020-11-10 DIAGNOSIS — A419 Sepsis, unspecified organism: Principal | ICD-10-CM | POA: Diagnosis present

## 2020-11-10 DIAGNOSIS — Z885 Allergy status to narcotic agent status: Secondary | ICD-10-CM | POA: Diagnosis not present

## 2020-11-10 DIAGNOSIS — Z794 Long term (current) use of insulin: Secondary | ICD-10-CM

## 2020-11-10 DIAGNOSIS — N179 Acute kidney failure, unspecified: Secondary | ICD-10-CM | POA: Diagnosis present

## 2020-11-10 DIAGNOSIS — Z7983 Long term (current) use of bisphosphonates: Secondary | ICD-10-CM | POA: Diagnosis not present

## 2020-11-10 DIAGNOSIS — R6510 Systemic inflammatory response syndrome (SIRS) of non-infectious origin without acute organ dysfunction: Secondary | ICD-10-CM

## 2020-11-10 DIAGNOSIS — M549 Dorsalgia, unspecified: Secondary | ICD-10-CM | POA: Diagnosis present

## 2020-11-10 DIAGNOSIS — J45909 Unspecified asthma, uncomplicated: Secondary | ICD-10-CM

## 2020-11-10 DIAGNOSIS — I10 Essential (primary) hypertension: Secondary | ICD-10-CM | POA: Diagnosis present

## 2020-11-10 DIAGNOSIS — G8929 Other chronic pain: Secondary | ICD-10-CM | POA: Diagnosis present

## 2020-11-10 DIAGNOSIS — K529 Noninfective gastroenteritis and colitis, unspecified: Secondary | ICD-10-CM

## 2020-11-10 DIAGNOSIS — Z923 Personal history of irradiation: Secondary | ICD-10-CM

## 2020-11-10 HISTORY — DX: Polyneuropathy, unspecified: G62.9

## 2020-11-10 HISTORY — DX: Essential (primary) hypertension: I10

## 2020-11-10 HISTORY — DX: Malignant neoplasm of endometrium: C54.1

## 2020-11-10 HISTORY — DX: Spinal stenosis, site unspecified: M48.00

## 2020-11-10 LAB — BASIC METABOLIC PANEL
Anion gap: 14 (ref 5–15)
BUN: 38 mg/dL — ABNORMAL HIGH (ref 8–23)
CO2: 16 mmol/L — ABNORMAL LOW (ref 22–32)
Calcium: 9.6 mg/dL (ref 8.9–10.3)
Chloride: 103 mmol/L (ref 98–111)
Creatinine, Ser: 3.11 mg/dL — ABNORMAL HIGH (ref 0.44–1.00)
GFR, Estimated: 16 mL/min — ABNORMAL LOW (ref >60)
Glucose, Bld: 579 mg/dL (ref 70–99)
Potassium: 4.2 mmol/L (ref 3.5–5.1)
Sodium: 133 mmol/L — ABNORMAL LOW (ref 135–145)

## 2020-11-10 LAB — URINALYSIS, ROUTINE W REFLEX MICROSCOPIC
Bilirubin Urine: NEGATIVE
Glucose, UA: >=500 mg/dL — AB
Hgb urine dipstick: NEGATIVE
Ketones, ur: NEGATIVE mg/dL
Leukocytes,Ua: NEGATIVE
Nitrite: NEGATIVE
Protein, ur: NEGATIVE mg/dL
Specific Gravity, Urine: 1.01 (ref 1.005–1.030)
pH: 5.5 (ref 5.0–8.0)

## 2020-11-10 LAB — GLUCOSE, CAPILLARY
Glucose-Capillary: 442 mg/dL — ABNORMAL HIGH (ref 70–99)
Glucose-Capillary: 390 mg/dL — ABNORMAL HIGH (ref 70–99)
Glucose-Capillary: 338 mg/dL — ABNORMAL HIGH (ref 70–99)
Glucose-Capillary: 313 mg/dL — ABNORMAL HIGH (ref 70–99)
Glucose-Capillary: 218 mg/dL — ABNORMAL HIGH (ref 70–99)
Glucose-Capillary: 101 mg/dL — ABNORMAL HIGH (ref 70–99)
Glucose-Capillary: 109 mg/dL — ABNORMAL HIGH (ref 70–99)
Glucose-Capillary: 107 mg/dL — ABNORMAL HIGH (ref 70–99)

## 2020-11-10 LAB — CBC
HCT: 43.3 % (ref 36.0–46.0)
Hemoglobin: 14.1 g/dL (ref 12.0–15.0)
MCH: 29 pg (ref 26.0–34.0)
MCHC: 32.6 g/dL (ref 30.0–36.0)
MCV: 89.1 fL (ref 80.0–100.0)
Platelets: 404 10*3/uL — ABNORMAL HIGH (ref 150–400)
RBC: 4.86 MIL/uL (ref 3.87–5.11)
RDW: 14.5 % (ref 11.5–15.5)
WBC: 16 10*3/uL — ABNORMAL HIGH (ref 4.0–10.5)
nRBC: 0 % (ref 0.0–0.2)

## 2020-11-10 LAB — URINALYSIS, MICROSCOPIC (REFLEX): Bacteria, UA: NONE SEEN

## 2020-11-10 LAB — CBG MONITORING, ED
Glucose-Capillary: 578 mg/dL (ref 70–99)
Glucose-Capillary: 569 mg/dL (ref 70–99)
Glucose-Capillary: 500 mg/dL — ABNORMAL HIGH (ref 70–99)
Glucose-Capillary: 511 mg/dL (ref 70–99)
Glucose-Capillary: 513 mg/dL (ref 70–99)

## 2020-11-10 LAB — RESP PANEL BY RT-PCR (FLU A&B, COVID) ARPGX2
Influenza A by PCR: NEGATIVE
Influenza B by PCR: NEGATIVE
SARS Coronavirus 2 by RT PCR: NEGATIVE

## 2020-11-10 LAB — MRSA NEXT GEN BY PCR, NASAL: MRSA by PCR Next Gen: NOT DETECTED

## 2020-11-10 LAB — MAGNESIUM: Magnesium: 2 mg/dL (ref 1.7–2.4)

## 2020-11-10 IMAGING — CT CT ABD-PELV W/ CM
2 of 5 series · 15 of 46 positions shown, 17 images · IV contrast (Omnipaque or Isovue)
Comparison: [DATE]

CLINICAL DATA: Lower abdominal pain since yesterday with nausea and
vomiting.

EXAM:
CT ABDOMEN AND PELVIS WITH CONTRAST
TECHNIQUE: Multidetector CT imaging of the abdomen and pelvis was performed
using the standard protocol following bolus administration of
intravenous contrast.
CONTRAST:  80mL OMNIPAQUE IOHEXOL 350 MG/ML SOLN

[Series 2: axial st · axial · 0.62mm/px · z∈[-470,-70]mm · 12 of 92 slices shown, 14 images]
[im 6/92  soft-tissue]
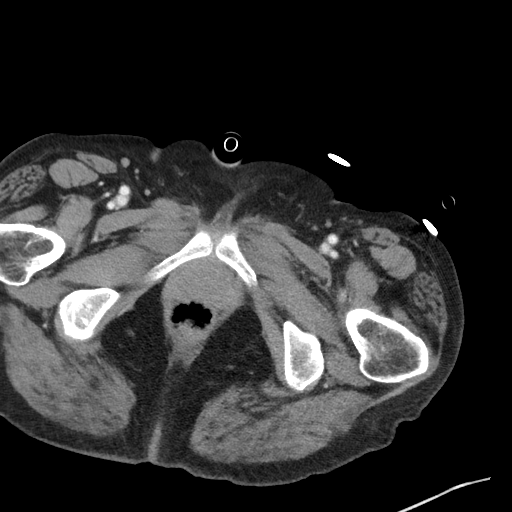
[im 6/92  bone]
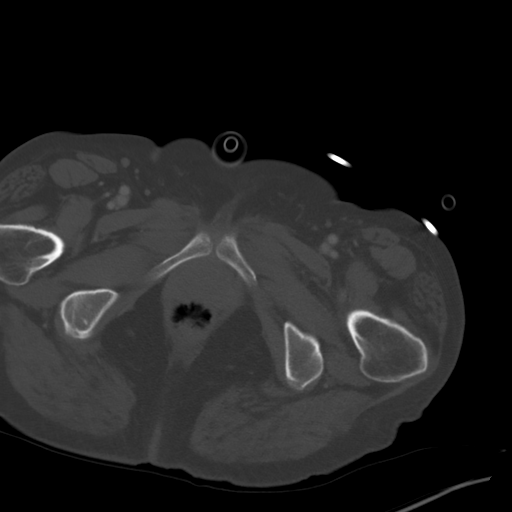
[im 16/92  soft-tissue]
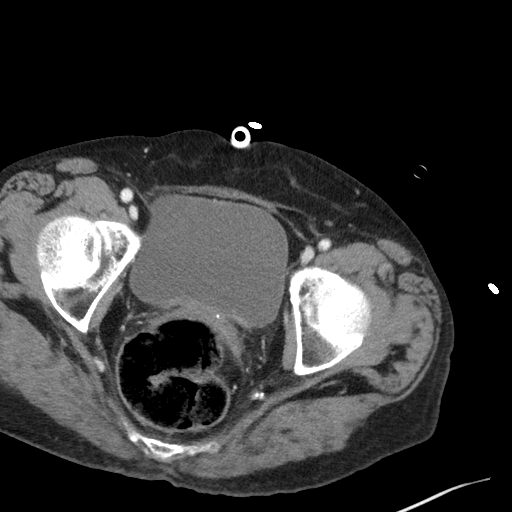
[im 21/92  soft-tissue]
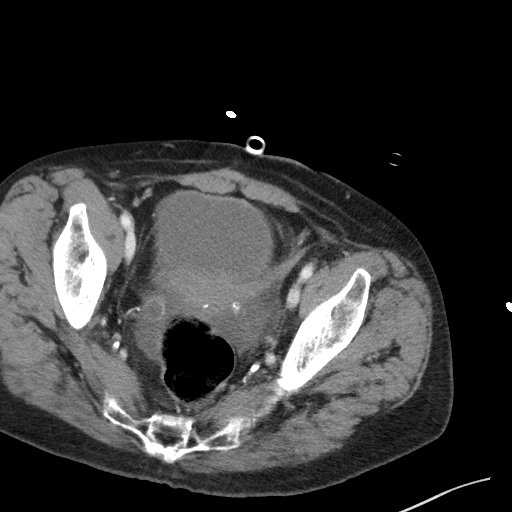
[im 26/92  soft-tissue]
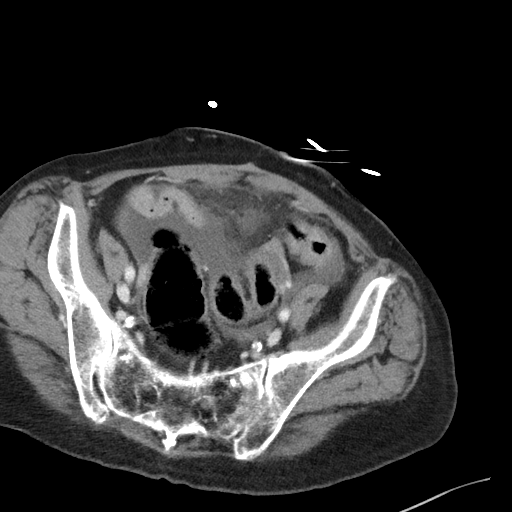
[im 36/92  soft-tissue]
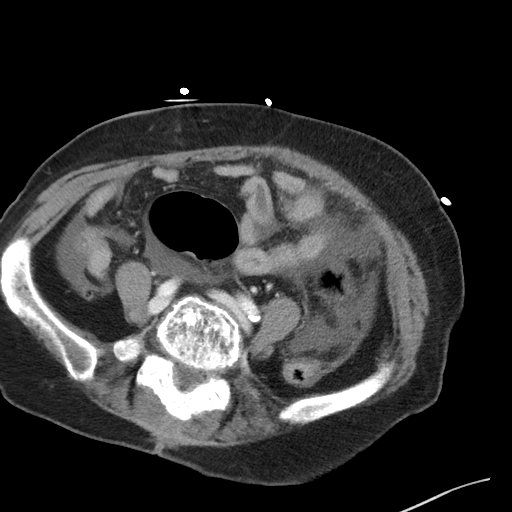
[im 41/92  soft-tissue]
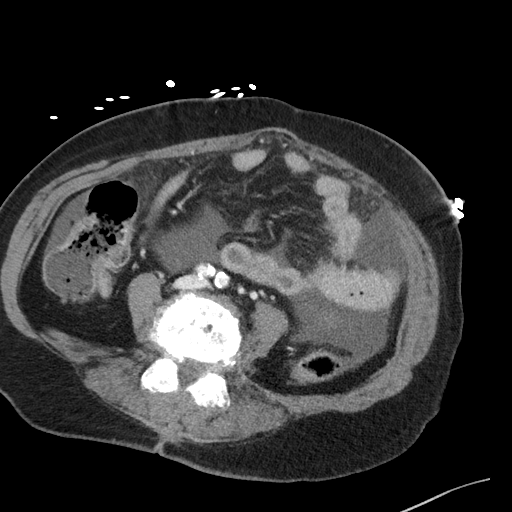
[im 51/92  soft-tissue]
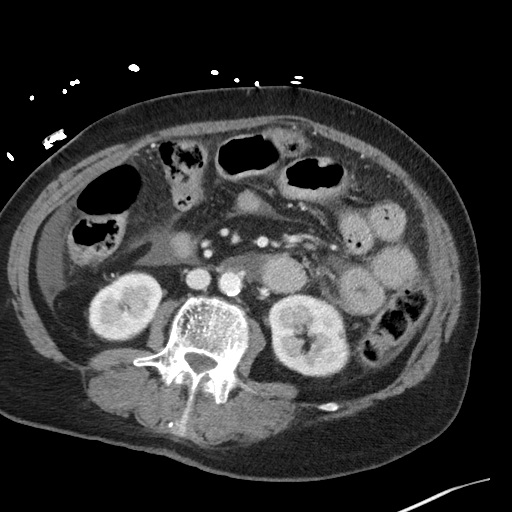
[im 56/92  soft-tissue]
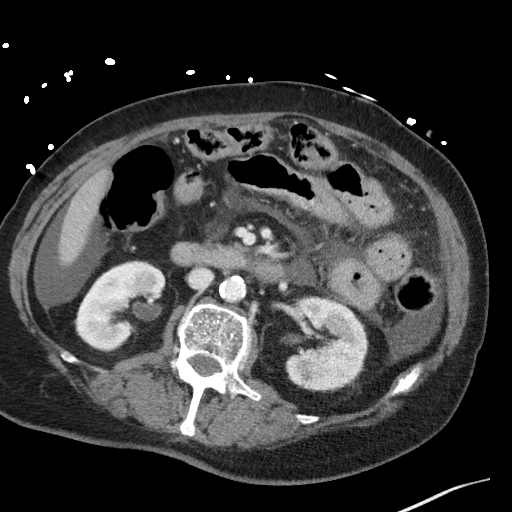
[im 66/92  soft-tissue]
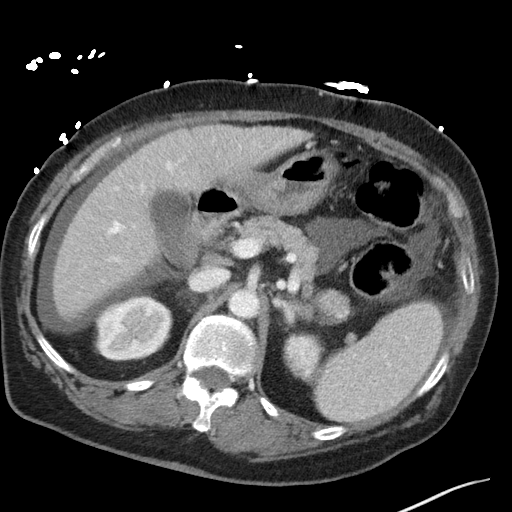
[im 66/92  bone]
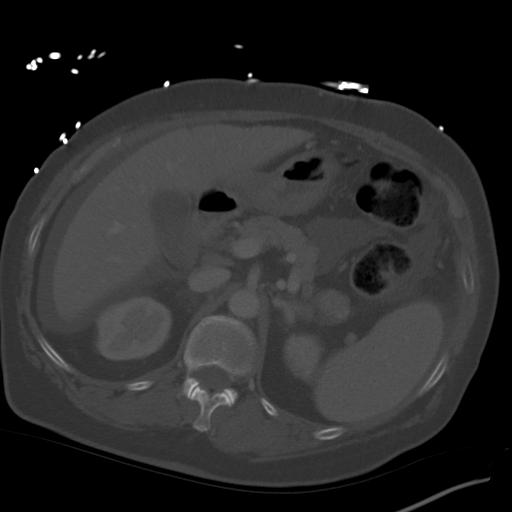
[im 71/92  soft-tissue]
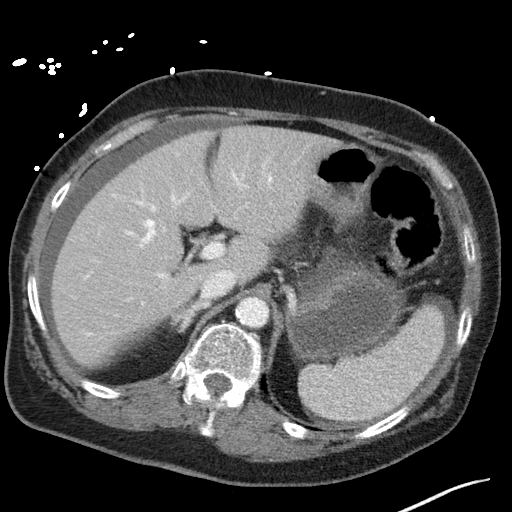
[im 76/92  soft-tissue]
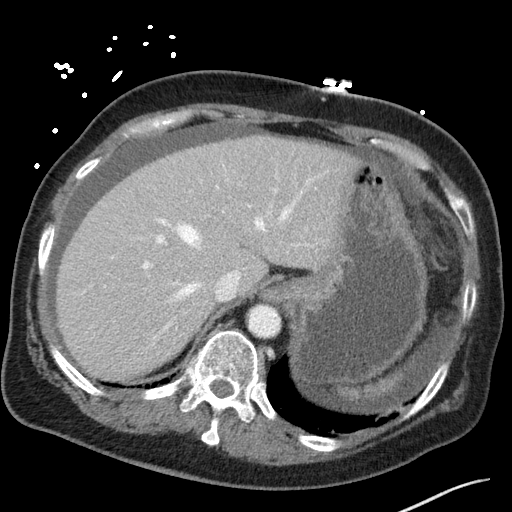
[im 86/92  soft-tissue]
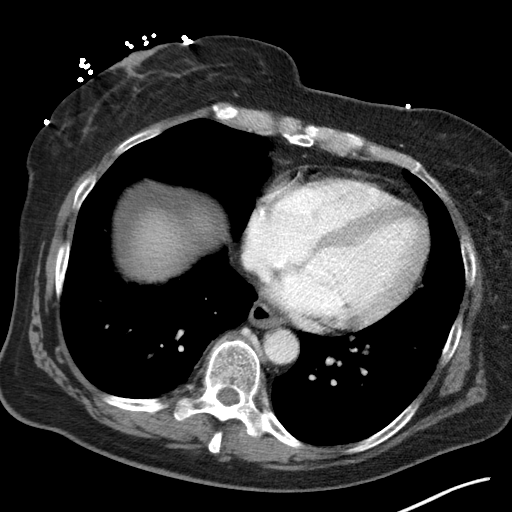

[Series 5: coronal st · coronal · 0.70mm/px · 3 of 78 slices shown]
[im 26/78  soft-tissue]
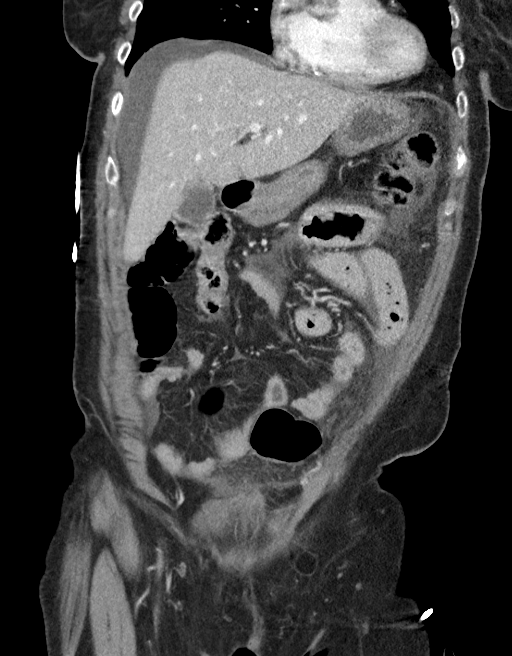
[im 35/78  soft-tissue]
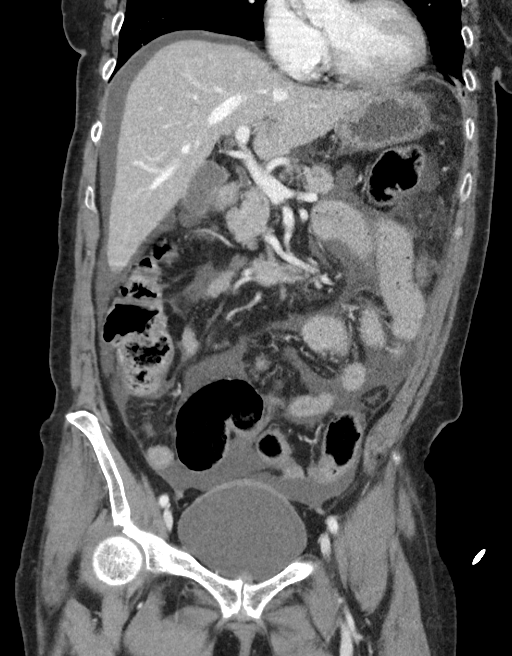
[im 43/78  soft-tissue]
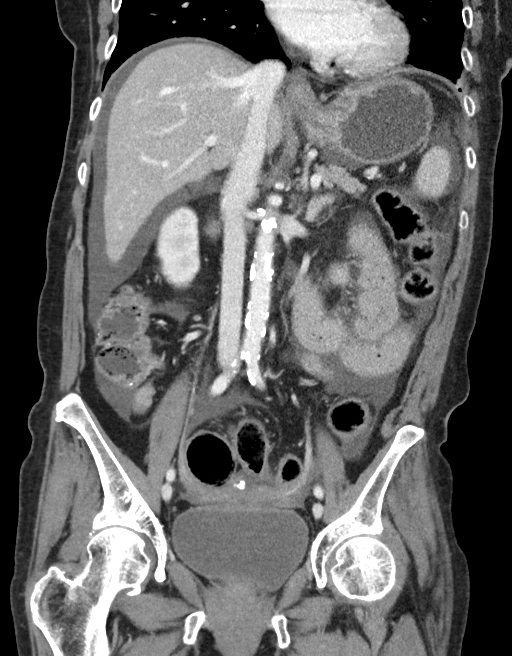

[15 of 46 positions shown; findings below may reference images not displayed]

FINDINGS: Lower chest: No acute pulmonary findings. Subpleural pulmonary
nodule in the right middle lobe is likely a benign lymph node and
unchanged since [4G]. The heart is normal in size. No pericardial
effusion. Aortic and coronary artery calcifications are noted.

Hepatobiliary: No hepatic lesions or intrahepatic biliary
dilatation. The gallbladder is unremarkable. No common bile duct
dilatation.

Pancreas: No mass, inflammation or ductal dilatation.

Spleen: Normal size. No focal lesions.

Adrenals/Urinary Tract: Small bilateral adrenal gland nodules are
stable and likely benign adenomas. The kidneys are unremarkable. No
renal lesions, renal calculi or hydronephrosis. The bladder is
grossly normal.

Stomach/Bowel: The stomach is unremarkable. The duodenum and
proximal small bowel demonstrate mild wall thickening. The colon
also demonstrates thickening slight enhancement. No mass lesions or
obstructive findings. Suspect enterocolitis.

Vascular/Lymphatic: Age advanced atherosclerotic calcifications
involving the aorta and iliac arteries. The branch vessels are
patent. No dissection. The major venous structures are patent.

Reproductive: The uterus is small. A calcified fundal fibroid is
noted. No adnexal mass.

Other: Small to moderate abdominal ascites. No omental disease or
peritoneal surface lesions are identified. This is likely related to
the enterocolitis. No peritoneal enhancement to suggest peritonitis.

Musculoskeletal: Moderate osteopenia. Postoperative changes
involving the lumbar spine. No acute bony findings or worrisome bone
lesions.
IMPRESSION: 1. CT findings suggest enterocolitis.
2. Small to moderate abdominal ascites.
3. Stable small bilateral adrenal gland nodules, likely benign
adenomas.
4. Age advanced atherosclerotic calcifications involving the aorta
and iliac arteries.

Aortic Atherosclerosis ([4G]-[4G]).

## 2020-11-10 MED ORDER — DEXTROSE-NACL 5-0.9 % IV SOLN: INTRAVENOUS | Status: DC

## 2020-11-10 MED ORDER — LABETALOL HCL 5 MG/ML IV SOLN
10.0000 mg | INTRAVENOUS | Status: DC | PRN
  Administered 2020-11-13: 10 mg via INTRAVENOUS
  Filled 2020-11-10: qty 4

## 2020-11-10 MED ORDER — INSULIN ASPART 100 UNIT/ML IJ SOLN: 0.0000 [IU] | Freq: Every day | INTRAMUSCULAR | Status: DC

## 2020-11-10 MED ORDER — SODIUM CHLORIDE 0.9 % IV BOLUS
1000.0000 mL | Freq: Once | INTRAVENOUS | Status: AC
  Administered 2020-11-10: 1000 mL via INTRAVENOUS

## 2020-11-10 MED ORDER — ENOXAPARIN SODIUM 40 MG/0.4ML IJ SOSY
40.0000 mg | PREFILLED_SYRINGE | INTRAMUSCULAR | Status: DC
  Administered 2020-11-10 – 2020-11-15 (×6): 40 mg via SUBCUTANEOUS
  Filled 2020-11-10 (×6): qty 0.4

## 2020-11-10 MED ORDER — ASPIRIN EC 81 MG PO TBEC
81.0000 mg | DELAYED_RELEASE_TABLET | Freq: Every day | ORAL | Status: DC
  Administered 2020-11-10 – 2020-11-22 (×13): 81 mg via ORAL
  Filled 2020-11-10 (×13): qty 1

## 2020-11-10 MED ORDER — INSULIN GLARGINE-YFGN 100 UNIT/ML ~~LOC~~ SOLN
10.0000 [IU] | Freq: Once | SUBCUTANEOUS | Status: AC
  Administered 2020-11-10: 10 [IU] via SUBCUTANEOUS
  Filled 2020-11-10: qty 0.1

## 2020-11-10 MED ORDER — PIPERACILLIN-TAZOBACTAM 3.375 G IVPB
3.3750 g | Freq: Once | INTRAVENOUS | Status: AC
  Administered 2020-11-10: 3.375 g via INTRAVENOUS
  Filled 2020-11-10: qty 50

## 2020-11-10 MED ORDER — LOSARTAN POTASSIUM 25 MG PO TABS
50.0000 mg | ORAL_TABLET | Freq: Two times a day (BID) | ORAL | Status: DC
  Filled 2020-11-10: qty 2

## 2020-11-10 MED ORDER — AMLODIPINE BESYLATE 5 MG PO TABS
10.0000 mg | ORAL_TABLET | Freq: Every day | ORAL | Status: DC
  Administered 2020-11-10 – 2020-11-22 (×13): 10 mg via ORAL
  Filled 2020-11-10 (×13): qty 2

## 2020-11-10 MED ORDER — ONDANSETRON HCL 4 MG/2ML IJ SOLN
4.0000 mg | Freq: Once | INTRAMUSCULAR | Status: AC
  Administered 2020-11-10: 4 mg via INTRAVENOUS
  Filled 2020-11-10: qty 2

## 2020-11-10 MED ORDER — ATORVASTATIN CALCIUM 40 MG PO TABS
80.0000 mg | ORAL_TABLET | Freq: Every day | ORAL | Status: DC
  Administered 2020-11-10 – 2020-11-22 (×13): 80 mg via ORAL
  Filled 2020-11-10 (×13): qty 2

## 2020-11-10 MED ORDER — ROPINIROLE HCL 0.25 MG PO TABS
0.2500 mg | ORAL_TABLET | Freq: Every day | ORAL | Status: DC
  Administered 2020-11-13 – 2020-11-22 (×10): 0.25 mg via ORAL
  Filled 2020-11-10 (×13): qty 1

## 2020-11-10 MED ORDER — SODIUM CHLORIDE 0.9 % IV SOLN: Freq: Once | INTRAVENOUS | Status: DC

## 2020-11-10 MED ORDER — INSULIN REGULAR(HUMAN) IN NACL 100-0.9 UT/100ML-% IV SOLN
INTRAVENOUS | Status: DC
  Administered 2020-11-10: 9.5 [IU]/h via INTRAVENOUS
  Filled 2020-11-10: qty 100

## 2020-11-10 MED ORDER — INSULIN ASPART 100 UNIT/ML IV SOLN
10.0000 [IU] | Freq: Once | INTRAVENOUS | Status: AC
  Administered 2020-11-10: 10 [IU] via INTRAVENOUS

## 2020-11-10 MED ORDER — ONDANSETRON HCL 4 MG/2ML IJ SOLN: 4.0000 mg | Freq: Four times a day (QID) | INTRAMUSCULAR | Status: DC | PRN

## 2020-11-10 MED ORDER — DEXTROSE IN LACTATED RINGERS 5 % IV SOLN: INTRAVENOUS | Status: DC

## 2020-11-10 MED ORDER — ALBUTEROL SULFATE HFA 108 (90 BASE) MCG/ACT IN AERS: 1.0000 | INHALATION_SPRAY | Freq: Four times a day (QID) | RESPIRATORY_TRACT | Status: DC | PRN

## 2020-11-10 MED ORDER — MORPHINE SULFATE (PF) 4 MG/ML IV SOLN
4.0000 mg | Freq: Once | INTRAVENOUS | Status: AC
  Administered 2020-11-10: 4 mg via INTRAVENOUS
  Filled 2020-11-10: qty 1

## 2020-11-10 MED ORDER — CHLORHEXIDINE GLUCONATE CLOTH 2 % EX PADS
6.0000 | MEDICATED_PAD | Freq: Every day | CUTANEOUS | Status: DC
  Administered 2020-11-10 – 2020-11-22 (×11): 6 via TOPICAL

## 2020-11-10 MED ORDER — INSULIN ASPART 100 UNIT/ML IJ SOLN
0.0000 [IU] | INTRAMUSCULAR | Status: DC
  Administered 2020-11-11 – 2020-11-14 (×6): 3 [IU] via SUBCUTANEOUS
  Administered 2020-11-14: 7 [IU] via SUBCUTANEOUS
  Administered 2020-11-15: 4 [IU] via SUBCUTANEOUS
  Administered 2020-11-15: 3 [IU] via SUBCUTANEOUS
  Administered 2020-11-15: 7 [IU] via SUBCUTANEOUS
  Administered 2020-11-15: 4 [IU] via SUBCUTANEOUS

## 2020-11-10 MED ORDER — INSULIN ASPART 100 UNIT/ML IJ SOLN: 0.0000 [IU] | INTRAMUSCULAR | Status: DC

## 2020-11-10 MED ORDER — IOHEXOL 350 MG/ML SOLN
80.0000 mL | Freq: Once | INTRAVENOUS | Status: AC | PRN
  Administered 2020-11-10: 80 mL via INTRAVENOUS

## 2020-11-10 MED ORDER — ALBUTEROL SULFATE (2.5 MG/3ML) 0.083% IN NEBU: 2.5000 mg | INHALATION_SOLUTION | Freq: Four times a day (QID) | RESPIRATORY_TRACT | Status: DC | PRN

## 2020-11-10 MED ORDER — HYDROMORPHONE HCL 1 MG/ML IJ SOLN
0.5000 mg | INTRAMUSCULAR | Status: DC | PRN
Start: 2020-11-10 — End: 2020-11-23
  Administered 2020-11-10 – 2020-11-20 (×17): 0.5 mg via INTRAVENOUS
  Filled 2020-11-10 (×9): qty 0.5
  Filled 2020-11-10: qty 1
  Filled 2020-11-10 (×9): qty 0.5

## 2020-11-10 MED ORDER — INSULIN ASPART 100 UNIT/ML IJ SOLN: 0.0000 [IU] | Freq: Three times a day (TID) | INTRAMUSCULAR | Status: DC

## 2020-11-10 MED ORDER — PIPERACILLIN-TAZOBACTAM 3.375 G IVPB 30 MIN: 3.3750 g | Freq: Four times a day (QID) | INTRAVENOUS | Status: DC

## 2020-11-10 MED ORDER — SODIUM CHLORIDE 0.9 % IV SOLN: INTRAVENOUS | Status: DC

## 2020-11-10 MED ORDER — DEXTROSE 50 % IV SOLN
0.0000 mL | INTRAVENOUS | Status: DC | PRN
  Administered 2020-11-12 – 2020-11-13 (×3): 50 mL via INTRAVENOUS
  Filled 2020-11-10 (×3): qty 50

## 2020-11-10 MED ORDER — LACTATED RINGERS IV SOLN: INTRAVENOUS | Status: DC

## 2020-11-10 MED ORDER — VITAMIN D 25 MCG (1000 UNIT) PO TABS
1000.0000 [IU] | ORAL_TABLET | Freq: Every day | ORAL | Status: DC
  Administered 2020-11-10 – 2020-11-22 (×12): 1000 [IU] via ORAL
  Filled 2020-11-10 (×13): qty 1

## 2020-11-10 MED ORDER — MORPHINE SULFATE (PF) 2 MG/ML IV SOLN: 2.0000 mg | INTRAVENOUS | Status: DC | PRN

## 2020-11-10 MED ORDER — PIPERACILLIN-TAZOBACTAM 3.375 G IVPB
3.3750 g | Freq: Three times a day (TID) | INTRAVENOUS | Status: DC
  Administered 2020-11-10 – 2020-11-17 (×21): 3.375 g via INTRAVENOUS
  Filled 2020-11-10 (×21): qty 50

## 2020-11-10 NOTE — ED Notes (Signed)
Pt trying to use restroom

## 2020-11-10 NOTE — Plan of Care (Signed)
Pt encouraged to ask for pain medication for increased pain.  Pt is a fall risk.  Precautions in place.  Blood sugars checked per order.  Problem: Education: Goal: Knowledge of General Education information will improve Description: Including pain rating scale, medication(s)/side effects and non-pharmacologic comfort measures Outcome: Progressing   Problem: Health Behavior/Discharge Planning: Goal: Ability to manage health-related needs will improve Outcome: Progressing   Problem: Clinical Measurements: Goal: Ability to maintain clinical measurements within normal limits will improve Outcome: Progressing Goal: Will remain free from infection Outcome: Progressing Goal: Diagnostic test results will improve Outcome: Progressing Goal: Respiratory complications will improve Outcome: Progressing Goal: Cardiovascular complication will be avoided Outcome: Progressing   Problem: Activity: Goal: Risk for activity intolerance will decrease Outcome: Progressing   Problem: Nutrition: Goal: Adequate nutrition will be maintained Outcome: Progressing   Problem: Coping: Goal: Level of anxiety will decrease Outcome: Progressing   Problem: Elimination: Goal: Will not experience complications related to bowel motility Outcome: Progressing Goal: Will not experience complications related to urinary retention Outcome: Progressing   Problem: Pain Managment: Goal: General experience of comfort will improve Outcome: Progressing   Problem: Safety: Goal: Ability to remain free from injury will improve Outcome: Progressing   Problem: Skin Integrity: Goal: Risk for impaired skin integrity will decrease Outcome: Progressing

## 2020-11-10 NOTE — ED Provider Notes (Signed)
[facility] EMERGENCY DEPARTMENT Provider Note   CSN: [id number] Arrival date & time: 11/10/20  0335     History Chief Complaint  Patient presents with   Abdominal Pain    Cheyenne [name] is a 70 y.o. female.  Patient is a 70 year old female with past medical history of diabetes, hypertension, and recent CVA.  Patient presenting today for evaluation of abdominal pain.  She describes a 2-day history of worsening pain across her lower abdomen.  This is worse when she moves or pushes on the area.  She denies fevers or chills.  She denies any diarrhea or constipation.  Patient reports being here yesterday evening, then leaving prior to being seen due to prolonged wait times.  The history is provided by the patient.  Abdominal Pain Pain location:  Suprapubic, RLQ and LLQ Pain quality: cramping   Pain radiates to:  Does not radiate Pain severity:  Severe Onset quality:  Gradual Duration:  2 days Timing:  Constant Progression:  Worsening Chronicity:  New Relieved by:  Nothing Worsened by:  Palpation and movement Ineffective treatments:  None tried     Past Medical History:  Diagnosis Date   Diabetes mellitus without complication ([place])    Stroke ([place])    left sided arm weakness    Patient Active Problem List   Diagnosis Date Noted   Cerebrovascular accident (CVA) ([place])    Generalized weakness 10/25/2020   Hypertension 10/25/2020   Type 2 diabetes mellitus with hyperlipidemia ([place]) 10/25/2020   Psychiatric problem 10/25/2020    Past Surgical History:  Procedure Laterality Date   BACK SURGERY       OB History   No obstetric history on file.     History reviewed. No pertinent family history.  Social History   Tobacco Use   Smoking status: Former    Packs/day: 0.50    Types: Cigarettes    Quit date: 10/26/2020    Years since quitting: 0.0   Smokeless tobacco: Never  Vaping Use   Vaping Use: Never used  Substance Use Topics   Alcohol use: Not Currently    Drug use: Never    Home Medications Prior to Admission medications   Medication Sig Start Date End Date Taking? Authorizing Provider  albuterol (VENTOLIN HFA) 108 (90 Base) MCG/ACT inhaler Inhale 1-2 puffs into the lungs every 6 (six) hours as needed for wheezing or shortness of breath. 01/18/19   Gertie Baron, NP  alendronate (FOSAMAX) 70 MG tablet Take 70 mg by mouth once a week. Wednesday 10/03/20   [provider]  amLODipine (NORVASC) 10 MG tablet Take 1 tablet (10 mg total) by mouth daily. 10/31/20   Regalado, Jerald Kief A, MD  aspirin 81 MG EC tablet Take 1 tablet by mouth daily. 11/02/04   [provider]  atorvastatin (LIPITOR) 40 MG tablet Take 80 mg by mouth daily. 11/26/17   [provider]  cholecalciferol (VITAMIN D3) 25 MCG (1000 UNIT) tablet Take 1,000 Units by mouth daily.    [provider]  clonazePAM (KLONOPIN) 0.5 MG tablet Take 0.5 mg by mouth 2 (two) times daily as needed. 10/04/20   [provider]  clopidogrel (PLAVIX) 75 MG tablet Take 1 tablet (75 mg total) by mouth daily with breakfast. 11/01/20   Regalado, Belkys A, MD  escitalopram (LEXAPRO) 10 MG tablet Take 10 mg by mouth daily.    [provider]  glimepiride (AMARYL) 1 MG tablet Take 1 mg by mouth daily with breakfast. 11/05/17  [provider]  insulin NPH Human (NOVOLIN N) 100 UNIT/ML injection Inject 0.13 mLs (13 Units total) into the skin 2 (two) times daily at 8 am and 10 pm. 10/31/20   Regalado, Belkys A, MD  loratadine (CLARITIN) 10 MG tablet Take 10 mg by mouth daily.    [provider]  losartan (COZAAR) 50 MG tablet Take 50 mg by mouth 2 (two) times daily. 11/26/17   [provider]  nicotine (NICODERM CQ - DOSED IN MG/24 HOURS) 21 mg/24hr patch Place 1 patch (21 mg total) onto the skin daily. 11/01/20   Regalado, Belkys A, MD  rOPINIRole (REQUIP) 0.25 MG tablet Take 0.25 mg by mouth at bedtime. 10/06/20   [provider]     Allergies    Gabapentin, Pregabalin, and Codeine  Review of Systems   Review of Systems  Gastrointestinal:  Positive for abdominal pain.  All other systems reviewed and are negative.  Physical Exam Updated Vital Signs Ht '5\' 4"'$  (1.626 m)   Wt 63.5 kg   BMI 24.03 kg/m   Physical Exam Vitals and nursing note reviewed.  Constitutional:      General: She is not in acute distress.    Appearance: She is well-developed. She is not diaphoretic.  HENT:     Head: Normocephalic and atraumatic.  Cardiovascular:     Rate and Rhythm: Normal rate and regular rhythm.     Heart sounds: No murmur heard.   No friction rub. No gallop.  Pulmonary:     Effort: Pulmonary effort is normal. No respiratory distress.     Breath sounds: Normal breath sounds. No wheezing.  Abdominal:     General: Bowel sounds are normal. There is no distension.     Palpations: Abdomen is soft.     Tenderness: There is abdominal tenderness in the right lower quadrant, suprapubic area and left lower quadrant. There is no right CVA tenderness, left CVA tenderness, guarding or rebound.  Musculoskeletal:        General: Normal range of motion.     Cervical back: Normal range of motion and neck supple.  Skin:    General: Skin is warm and dry.  Neurological:     General: No focal deficit present.     Mental Status: She is alert and oriented to person, place, and time.    ED Results / Procedures / Treatments   Labs (all labs ordered are listed, but only abnormal results are displayed) Labs Reviewed  URINALYSIS, ROUTINE W REFLEX MICROSCOPIC    EKG None  Radiology No results found.  Procedures Procedures   Medications Ordered in ED Medications  sodium chloride 0.9 % bolus 1,000 mL (has no administration in time range)  ondansetron (ZOFRAN) injection 4 mg (has no administration in time range)  morphine 4 MG/ML injection 4 mg (has no administration in time range)    ED Course  I have reviewed the  triage vital signs and the nursing notes.  Pertinent labs & imaging results that were available during my care of the patient were reviewed by me and considered in my medical decision making (see chart for details).    MDM Rules/Calculators/A&P  Patient presenting with complaints of lower abdominal pain that began yesterday.  She initially presented here yesterday evening, but left without being seen due to prolonged wait times.  Laboratory studies obtained on her initial visit reveal a white count of 16,000.  Patient returns with ongoing pain and has exquisite tenderness across the lower  abdomen.  CT scan obtained shows enterocolitis, the etiology of which I am uncertain.  Due to the white count and level of discomfort, patient will be started on Zosyn.  I feel as though patient will require admission for further observation.  I have spoken with Dr. Josephine Cables who agrees to admit.  Final Clinical Impression(s) / ED Diagnoses Final diagnoses:  None    Rx / DC Orders ED Discharge Orders     None        Veryl Speak, MD 11/10/20 [phone]

## 2020-11-10 NOTE — Progress Notes (Signed)
Attempted to call ED to get report, no answer at this time

## 2020-11-10 NOTE — ED Notes (Signed)
Took pt breakfast tray did not want

## 2020-11-10 NOTE — ED Triage Notes (Signed)
Pt arrives EMS from home with c/o lower abd pain that started yesterday morning. Pt also reports some nausea and vomiting as well.

## 2020-11-10 NOTE — ED Notes (Signed)
MD notified of blood sugar. See orders.

## 2020-11-10 NOTE — ED Notes (Signed)
Re-checked vital signs due to respirations show 36

## 2020-11-10 NOTE — Progress Notes (Signed)
Insulin gtt to be turned off at 1925, which would be 2 hours after long acting insulin was given.

## 2020-11-10 NOTE — H&P (Signed)
History and Physical  Cheyenne [name] ZLD:[id number] DOB: [birth year] DOA: 11/10/2020  Referring physician: Veryl Speak, MD PCP: Toni Arthurs, PA  Patient coming from: Home  Chief Complaint: Abdominal pain  HPI: Cheyenne [name] is a 70 y.o. female with medical history significant for type II DM, CVA, hypertension, hyperlipidemia, COPD, tobacco use who presents to the emergency department due to 2 day onset of constant lower abdominal pain.  He was associated with nausea and vomiting, there was no known alleviating factor and the pain was severe and was described as sharp and crampy in nature.  She denies fever, chills, diarrhea, constipation.  Last bowel movement was 2 to 3 days ago and was normal per patient.  She presented to the emergency department yesterday in the evening, but she left due to long wait in the ED.  She returned this morning with EMS due to persistent and worsening of the abdominal pain.  ED Course:  In the emergency department, tachypneic and tachycardic.  Work-up in the emergency department showed leukocytosis, BUN to creatinine 22/1.46 (baseline creatinine is 0.7-0.9).  Lipase 20 CT abdomen and pelvis with contrast showed enterocolitis, small to moderate abdominal ascites. She was treated with IV morphine 4 mg x 1, IV Zofran was given, she was started on IV hydration and IV Zosyn was given.  Hospitalist was asked to admit patient for further evaluation and management.  Review of Systems: Constitutional: Negative for chills and fever.  HENT: Negative for ear pain and sore throat.   Eyes: Negative for pain and visual disturbance.  Respiratory: Negative for cough, chest tightness and shortness of breath.   Cardiovascular: Negative for chest pain and palpitations.  Gastrointestinal: Positive for abdominal pain, nausea and vomiting.  Endocrine: Negative for polyphagia and polyuria.  Genitourinary: Negative for decreased urine volume, dysuria, enuresis Musculoskeletal:  Negative for arthralgias and back pain.  Skin: Negative for color change and rash.  Allergic/Immunologic: Negative for immunocompromised state.  Neurological: Negative for tremors, syncope, speech difficulty Hematological: Does not bruise/bleed easily.  All other systems reviewed and are negative   Past Medical History:  Diagnosis Date   Diabetes mellitus without complication ([place])    Stroke ([place])    left sided arm weakness   Past Surgical History:  Procedure Laterality Date   BACK SURGERY      Social History:  reports that she quit smoking about 2 weeks ago. Her smoking use included cigarettes. She smoked an average of .5 packs per day. She has never used smokeless tobacco. She reports that she does not currently use alcohol. She reports that she does not use drugs.   Allergies  Allergen Reactions   Gabapentin     Bad dreams   Pregabalin     Drowsiness. Mind not clear.   Codeine Itching    History reviewed. No pertinent family history.    Prior to Admission medications   Medication Sig Start Date End Date Taking? Authorizing Provider  albuterol (VENTOLIN HFA) 108 (90 Base) MCG/ACT inhaler Inhale 1-2 puffs into the lungs every 6 (six) hours as needed for wheezing or shortness of breath. 01/18/19   Gertie Baron, NP  alendronate (FOSAMAX) 70 MG tablet Take 70 mg by mouth once a week. Wednesday 10/03/20   [provider]  amLODipine (NORVASC) 10 MG tablet Take 1 tablet (10 mg total) by mouth daily. 10/31/20   Regalado, Jerald Kief A, MD  aspirin 81 MG EC tablet Take 1 tablet by mouth daily. 11/02/04   [provider]  atorvastatin (LIPITOR) 40 MG tablet Take 80 mg by mouth daily. 11/26/17   [provider]  cholecalciferol (VITAMIN D3) 25 MCG (1000 UNIT) tablet Take 1,000 Units by mouth daily.    [provider]  clonazePAM (KLONOPIN) 0.5 MG tablet Take 0.5 mg by mouth 2 (two) times daily as needed. 10/04/20   [provider]  clopidogrel  (PLAVIX) 75 MG tablet Take 1 tablet (75 mg total) by mouth daily with breakfast. 11/01/20   Regalado, Belkys A, MD  escitalopram (LEXAPRO) 10 MG tablet Take 10 mg by mouth daily.    [provider]  glimepiride (AMARYL) 1 MG tablet Take 1 mg by mouth daily with breakfast. 11/05/17   [provider]  insulin NPH Human (NOVOLIN N) 100 UNIT/ML injection Inject 0.13 mLs (13 Units total) into the skin 2 (two) times daily at 8 am and 10 pm. 10/31/20   Regalado, Belkys A, MD  loratadine (CLARITIN) 10 MG tablet Take 10 mg by mouth daily.    [provider]  losartan (COZAAR) 50 MG tablet Take 50 mg by mouth 2 (two) times daily. 11/26/17   [provider]  nicotine (NICODERM CQ - DOSED IN MG/24 HOURS) 21 mg/24hr patch Place 1 patch (21 mg total) onto the skin daily. 11/01/20   Regalado, Belkys A, MD  rOPINIRole (REQUIP) 0.25 MG tablet Take 0.25 mg by mouth at bedtime. 10/06/20   [provider]    Physical Exam: BP (!) 148/86   Pulse (!) 124   Temp 97.7 F (36.5 C)   Resp 20   Ht 5' 4"  (1.626 m)   Wt 63.5 kg   SpO2 99%   BMI 24.03 kg/m   General: 70 y.o. year-old female well developed well nourished in no acute distress.  Alert and oriented x3. HEENT: NCAT, EOMI Neck: Supple, trachea medial Cardiovascular: Regular rate and rhythm with no rubs or gallops.  No thyromegaly or JVD noted.  No lower extremity edema. 2/4 pulses in all 4 extremities. Respiratory: Clear to auscultation with no wheezes or rales. Good inspiratory effort. Abdomen: Soft, tender to palpation of lower quadrants with no guarding.  Nondistended with normal bowel sounds x4 quadrants. Muskuloskeletal: No cyanosis, clubbing or edema noted bilaterally Neuro: CN II-XII intact, sensation, reflexes intact.  No focal neurologic deficit Skin: No ulcerative lesions noted or rashes Psychiatry: Mood is appropriate for condition and setting          Labs on Admission:  Basic Metabolic Panel: Recent  Labs  Lab 11/09/20 1720  NA 136  K 4.1  CL 102  CO2 24  GLUCOSE 357*  BUN 22  CREATININE 1.46*  CALCIUM 9.4   Liver Function Tests: Recent Labs  Lab 11/09/20 1720  AST 15  ALT 21  ALKPHOS 79  BILITOT 0.5  PROT 7.2  ALBUMIN 3.9   Recent Labs  Lab 11/09/20 1720  LIPASE 20   No results for input(s): AMMONIA in the last 168 hours. CBC: Recent Labs  Lab 11/09/20 1720  WBC 16.6*  HGB 14.5  HCT 43.8  MCV 87.3  PLT 331   Cardiac Enzymes: No results for input(s): CKTOTAL, CKMB, CKMBINDEX, TROPONINI in the last 168 hours.  BNP (last 3 results) No results for input(s): BNP in the last 8760 hours.  ProBNP (last 3 results) No results for input(s): PROBNP in the last 8760 hours.  CBG: No results for input(s): GLUCAP in the last 168 hours.  Radiological Exams on Admission: CT ABDOMEN  PELVIS W CONTRAST  Result Date: 11/10/2020 CLINICAL DATA:  Lower abdominal pain since yesterday with nausea and vomiting. EXAM: CT ABDOMEN AND PELVIS WITH CONTRAST TECHNIQUE: Multidetector CT imaging of the abdomen and pelvis was performed using the standard protocol following bolus administration of intravenous contrast. CONTRAST:  63m OMNIPAQUE IOHEXOL 350 MG/ML SOLN COMPARISON:  11/27/2017 FINDINGS: Lower chest: No acute pulmonary findings. Subpleural pulmonary nodule in the right middle lobe is likely a benign lymph node and unchanged since 2016. The heart is normal in size. No pericardial effusion. Aortic and coronary artery calcifications are noted. Hepatobiliary: No hepatic lesions or intrahepatic biliary dilatation. The gallbladder is unremarkable. No common bile duct dilatation. Pancreas: No mass, inflammation or ductal dilatation. Spleen: Normal size. No focal lesions. Adrenals/Urinary Tract: Small bilateral adrenal gland nodules are stable and likely benign adenomas. The kidneys are unremarkable. No renal lesions, renal calculi or hydronephrosis. The bladder is grossly normal.  Stomach/Bowel: The stomach is unremarkable. The duodenum and proximal small bowel demonstrate mild wall thickening. The colon also demonstrates thickening slight enhancement. No mass lesions or obstructive findings. Suspect enterocolitis. Vascular/Lymphatic: Age advanced atherosclerotic calcifications involving the aorta and iliac arteries. The branch vessels are patent. No dissection. The major venous structures are patent. Reproductive: The uterus is small. A calcified fundal fibroid is noted. No adnexal mass. Other: Small to moderate abdominal ascites. No omental disease or peritoneal surface lesions are identified. This is likely related to the enterocolitis. No peritoneal enhancement to suggest peritonitis. Musculoskeletal: Moderate osteopenia. Postoperative changes involving the lumbar spine. No acute bony findings or worrisome bone lesions. IMPRESSION: 1. CT findings suggest enterocolitis. 2. Small to moderate abdominal ascites. 3. Stable small bilateral adrenal gland nodules, likely benign adenomas. 4. Age advanced atherosclerotic calcifications involving the aorta and iliac arteries. Aortic Atherosclerosis (ICD10-I70.0). Electronically Signed   By: PMarijo SanesM.D.   On: 11/10/2020 05:39    EKG: I independently viewed the EKG done and my findings are as followed: EKG was not done in the ED  Assessment/Plan Present on Admission:  Enterocolitis  Cerebrovascular accident (CVA) ([place]  Principal Problem:   Enterocolitis Active Problems:   Essential hypertension   Cerebrovascular accident ([place]A) ([place]   Abdominal pain   Nausea & vomiting   AK[place] ([place]) [place]   Hyperglycemia due to diabetes mellitus ([facility])   Leukocytosis   Mixed hyperlipidemia   Tobacco use   Asthma   SIRS (systemic inflammatory response syndrome) ([facility])   Restless leg syndrome   Vitamin D deficiency  Abdominal pain, nausea and vomiting secondary to enterocolitis CT abdomen pelvis showed  enterocolitis Patient will be admitted to [facility] Continue IV NS at 100 mLs/Hr Continue IV Zosyn 3.375 q.6h Continue IV morphine 2 mg q.4h p.r.n. for moderate to severe pain Continue IV Zofran p.r.n. Continue clear liquid diet with plan to advanced diet as tolerated Blood culture x2 pending Consider surgical consult for worsening abdominal pain   SIRS in the setting of above Patient was tachypneic, tachycardic and WBC was 16.6 (met SIRS criteria) Continue treatment as described above  Leukocytosis WBC 16.6, continue treatment as described for enterocolitis  Acute kidney injury BUN to creatinine 22/1.46 (baseline creatinine is 0.7-0.9). Continue IV hydration Renally adjust medications, avoid nephrotoxic agents/dehydration/hypotension  Hyperglycemia secondary to type 2 diabetes mellitus Continue ISS and hypoglycemic protocol  Essential hypertension (uncontrolled) Continue Norvasc and Cozaar  Hyperlipidemia Continue Lipitor  History of CVA Continue aspirin, Lipitor  Restless leg syndrome Continue Requip  Vitamin D deficiency Continue vitamin  D3  COPD Continue albuterol.  Tobacco use Patient counseled on tobacco abuse cessation, she states that she quit smoking about 2 weeks ago   DVT prophylaxis: Lovenox  Code Status: Full code  Family Communication: None at bedside  Disposition Plan:  Patient is from:                        home Anticipated DC to:                   SNF or family members home Anticipated DC date:               2-3 days Anticipated DC barriers:          Patient requires inpatient treatment due to abdominal pain secondary to enterocolitis requiring inpatient treatment    Consults called: None  Admission status: Inpatient    Bernadette Hoit MD [facility]  11/10/2020, 7:12 AM

## 2020-11-10 NOTE — ED Notes (Signed)
Waiting on lab to collect cultures before starting ABX

## 2020-11-10 NOTE — Progress Notes (Signed)
Cheyenne [name] is a 70 y.o. female with medical history significant for type II DM, CVA, hypertension, hyperlipidemia, COPD, tobacco use who presents to the emergency department due to 2 day onset of constant lower abdominal pain.  She was also noted to have some associated nausea and vomiting and was admitted for enterocolitis based on CT findings.  She has been started on IV fluid as well as IV Zosyn.  She is also noted to have hyperglycemia secondary to type 2 diabetes at this time.  Sepsis present on admission secondary to enterocolitis -Continue clear liquid diet for now -IV fluid with normal saline -IV Zosyn -Blood cultures pending -Monitor leukocytosis  AKI -Baseline creatinine 0.7-0.9 -Continue IV fluid hydration, likely prerenal -Hold losartan -Monitor strict I's and O's  Hyperglycemia secondary to type 2 diabetes -10 units insulin IV now -SSI to every 4 resistant scale  Essential hypertension -IV labetalol added for poor BP control -Continue Norvasc -Hold Cozaar given AKI  History of CVA/dyslipidemia -Lipitor -Continue aspirin  Restless leg syndrome -Requip  History of COPD with ongoing tobacco abuse -Counseled on cessation -Albuterol as needed for shortness of breath or wheezing  Discussed with granddaughter on phone 9/8.  Total care time: 35 minutes

## 2020-11-10 NOTE — Progress Notes (Addendum)
Inpatient Diabetes Program Recommendations  AACE/ADA: New Consensus Statement on Inpatient Glycemic Control (2015)  Target Ranges:  Prepandial:   less than 140 mg/dL      Peak postprandial:   less than 180 mg/dL (1-2 hours)      Critically ill patients:  140 - 180 mg/dL   Lab Results  Component Value Date   GLUCAP 511 (HH) 11/10/2020   HGBA1C 9.0 (H) 10/25/2020   HGBA1C 9.1 (H) 10/25/2020    Review of Glycemic Control Results for TALLEY, POLACHEK (MRN [id number]) as of 11/10/2020 11:53  Ref. Range 11/10/2020 08:32 11/10/2020 10:11 11/10/2020 11:17 11/10/2020 11:50  Glucose-Capillary Latest Ref Range: 70 - 99 mg/dL 578 (HH) 569 (HH) 500 (H) 511 (HH)   Diabetes history: Type 2 dM Outpatient Diabetes medications: NPH 13 units BID, Novolog 8 units TID, Amaryl 1 mg QD Current orders for Inpatient glycemic control: IV insulin, Novolog 0-20 units Q4H  Inpatient Diabetes Program Recommendations:    Attempted to reach patient by phone and through RN to verify home doses. Unsuccessful.  Secure chat sent to MD to clarify plan for transition, recs and labs.  Hyperglycemic this AM. Missed QHS dose of NPH. Assuming elevated due to sepsis and missed QHS dose.  Consider adding Semglee 10 units two hours prior to discontinuation of IV insulin, then QD to follow. Orders received. Secure chat sent to RN.  Addendum: Patient not appropriate to discuss today.  Thanks, Bronson Curb, MSN, RNC-OB Diabetes Coordinator [phone] (8a-5p)

## 2020-11-11 DIAGNOSIS — K529 Noninfective gastroenteritis and colitis, unspecified: Secondary | ICD-10-CM | POA: Diagnosis not present

## 2020-11-11 LAB — PROTIME-INR
INR: 1.3 — ABNORMAL HIGH (ref 0.8–1.2)
Prothrombin Time: 16.1 seconds — ABNORMAL HIGH (ref 11.4–15.2)

## 2020-11-11 LAB — COMPREHENSIVE METABOLIC PANEL
ALT: 15 U/L (ref 0–44)
AST: 13 U/L — ABNORMAL LOW (ref 15–41)
Albumin: 3 g/dL — ABNORMAL LOW (ref 3.5–5.0)
Alkaline Phosphatase: 69 U/L (ref 38–126)
Anion gap: 8 (ref 5–15)
BUN: 27 mg/dL — ABNORMAL HIGH (ref 8–23)
CO2: 22 mmol/L (ref 22–32)
Calcium: 9.2 mg/dL (ref 8.9–10.3)
Chloride: 112 mmol/L — ABNORMAL HIGH (ref 98–111)
Creatinine, Ser: 1.17 mg/dL — ABNORMAL HIGH (ref 0.44–1.00)
GFR, Estimated: 51 mL/min — ABNORMAL LOW (ref >60)
Glucose, Bld: 155 mg/dL — ABNORMAL HIGH (ref 70–99)
Potassium: 4.1 mmol/L (ref 3.5–5.1)
Sodium: 142 mmol/L (ref 135–145)
Total Bilirubin: 0.5 mg/dL (ref 0.3–1.2)
Total Protein: 6.5 g/dL (ref 6.5–8.1)

## 2020-11-11 LAB — GLUCOSE, CAPILLARY
Glucose-Capillary: 107 mg/dL — ABNORMAL HIGH (ref 70–99)
Glucose-Capillary: 107 mg/dL — ABNORMAL HIGH (ref 70–99)
Glucose-Capillary: 109 mg/dL — ABNORMAL HIGH (ref 70–99)
Glucose-Capillary: 148 mg/dL — ABNORMAL HIGH (ref 70–99)
Glucose-Capillary: 154 mg/dL — ABNORMAL HIGH (ref 70–99)
Glucose-Capillary: 164 mg/dL — ABNORMAL HIGH (ref 70–99)
Glucose-Capillary: 178 mg/dL — ABNORMAL HIGH (ref 70–99)

## 2020-11-11 LAB — CBC
HCT: 38.5 % (ref 36.0–46.0)
Hemoglobin: 12.4 g/dL (ref 12.0–15.0)
MCH: 29 pg (ref 26.0–34.0)
MCHC: 32.2 g/dL (ref 30.0–36.0)
MCV: 90 fL (ref 80.0–100.0)
Platelets: 295 10*3/uL (ref 150–400)
RBC: 4.28 MIL/uL (ref 3.87–5.11)
RDW: 14.6 % (ref 11.5–15.5)
WBC: 15.4 10*3/uL — ABNORMAL HIGH (ref 4.0–10.5)
nRBC: 0 % (ref 0.0–0.2)

## 2020-11-11 LAB — MAGNESIUM: Magnesium: 1.9 mg/dL (ref 1.7–2.4)

## 2020-11-11 LAB — PHOSPHORUS: Phosphorus: 3.4 mg/dL (ref 2.5–4.6)

## 2020-11-11 LAB — APTT: aPTT: 32 seconds (ref 24–36)

## 2020-11-11 MED ORDER — SODIUM CHLORIDE 0.9 % IV SOLN: INTRAVENOUS | Status: DC

## 2020-11-11 MED ORDER — INSULIN DETEMIR 100 UNIT/ML ~~LOC~~ SOLN
10.0000 [IU] | Freq: Two times a day (BID) | SUBCUTANEOUS | Status: DC
  Administered 2020-11-11 – 2020-11-15 (×7): 10 [IU] via SUBCUTANEOUS
  Filled 2020-11-11 (×13): qty 0.1

## 2020-11-11 NOTE — Progress Notes (Signed)
PROGRESS NOTE    Cheyenne [name]  [id number] DOB: 10-04-1950 DOA: 11/10/2020 PCP: Toni Arthurs, PA   Brief Narrative:   Cheyenne [name] is a 70 y.o. female with medical history significant for type II DM, CVA, hypertension, hyperlipidemia, COPD, tobacco use who presents to the emergency department due to 2 day onset of constant lower abdominal pain.  She was also noted to have some associated nausea and vomiting and was admitted for enterocolitis based on CT findings.  She has been started on IV fluid as well as IV Zosyn.  She is also noted to have hyperglycemia secondary to type 2 diabetes at this time.  Assessment & Plan:   Principal Problem:   Enterocolitis Active Problems:   Essential hypertension   Cerebrovascular accident (CVA) ([place])   Abdominal pain   Nausea & vomiting   AKI (acute kidney injury) ([place])   Hyperglycemia due to diabetes mellitus (HCC)   Leukocytosis   Mixed hyperlipidemia   Tobacco use   Asthma   SIRS (systemic inflammatory response syndrome) (HCC)   Restless leg syndrome   Vitamin D deficiency   Enteritis   Sepsis present on admission secondary to enterocolitis -Continue clear liquid diet for now -DC IV fluid -IV Zosyn -Blood cultures with no growth noted thus far -Monitor leukocytosis -Consider repeat CT by a.m. depending on symptoms   AKI-improving -Baseline creatinine 0.7-0.9 -Continue IV fluid hydration, likely prerenal -Hold losartan -Monitor strict I's and O's   Hyperglycemia secondary to type 2 diabetes-improved - 10 units Levemir twice daily as ordered and SSI -Okay to transfer to telemetry   Essential hypertension -IV labetalol added for poor BP control -Continue Norvasc -Hold Cozaar given AKI   History of CVA/dyslipidemia -Lipitor -Continue aspirin   Restless leg syndrome -Requip   History of COPD with ongoing tobacco abuse -Counseled on cessation -Albuterol as needed for shortness of breath or wheezing   DVT  prophylaxis: Lovenox Code Status: Full Family Communication: Discussed with granddaughter 9/8 Disposition Plan:  Status is: Inpatient  Remains inpatient appropriate because:IV treatments appropriate due to intensity of illness or inability to take PO and Inpatient level of care appropriate due to severity of illness  Dispo: The patient is from: Home              Anticipated d/c is to: Home              Patient currently is not medically stable to d/c.   Difficult to place patient No   Consultants:  None  Procedures:  See below  Antimicrobials:  Anti-infectives (From admission, onward)    Start     Dose/Rate Route Frequency Ordered Stop   11/10/20 1400  piperacillin-tazobactam (ZOSYN) IVPB 3.375 g        3.375 g 12.5 mL/hr over 240 Minutes Intravenous Every 8 hours 11/10/20 0732     11/10/20 1200  piperacillin-tazobactam (ZOSYN) IVPB 3.375 g  Status:  Discontinued        3.375 g 100 mL/hr over 30 Minutes Intravenous Every 6 hours 11/10/20 0654 11/10/20 0732   11/10/20 0615  piperacillin-tazobactam (ZOSYN) IVPB 3.375 g        3.375 g 12.5 mL/hr over 240 Minutes Intravenous  Once 11/10/20 0603 11/10/20 1038       Subjective: Patient seen and evaluated today with ongoing abdominal pain noted.  She is tolerating clear liquid diet with no further nausea or vomiting.  Blood glucose control has improved.  Objective: Vitals:   11/11/20  0600 11/11/20 0700 11/11/20 0800 11/11/20 0900  BP: (!) 127/50 (!) 180/76 (!) 177/74 (!) 159/101  Pulse: 95 (!) 101 (!) 108 (!) 105  Resp: (!) 21 18 (!) 27 (!) 23  Temp:  98.4 F (36.9 C)    TempSrc:  Oral    SpO2: 93% 95% 95% 95%  Weight:      Height:        Intake/Output Summary (Last 24 hours) at 11/11/2020 0931 Last data filed at 11/11/2020 0550 Gross per 24 hour  Intake 3381.82 ml  Output 1125 ml  Net 2256.82 ml   Filed Weights   11/10/20 0356  Weight: 63.5 kg    Examination:  General exam: Appears calm and comfortable   Respiratory system: Clear to auscultation. Respiratory effort normal.  Currently on nasal cannula 2 L Cardiovascular system: S1 & S2 heard, RRR.  Gastrointestinal system: Abdomen is soft, tender to palpation throughout Central nervous system: Alert and awake Extremities: No edema Skin: No significant lesions noted Psychiatry: Flat affect.    Data Reviewed: I have personally reviewed following labs and imaging studies  CBC: Recent Labs  Lab 11/09/20 1720 11/10/20 1048 11/11/20 0510  WBC 16.6* 16.0* 15.4*  HGB 14.5 14.1 12.4  HCT 43.8 43.3 38.5  MCV 87.3 89.1 90.0  PLT 331 404* [id number]   Basic Metabolic Panel: Recent Labs  Lab 11/09/20 1720 11/10/20 1048 11/11/20 0510  NA 136 133* 142  K 4.1 4.2 4.1  CL 102 103 112*  CO2 24 16* 22  GLUCOSE 357* 579* 155*  BUN 22 38* 27*  CREATININE 1.46* 3.11* 1.17*  CALCIUM 9.4 9.6 9.2  MG  --  2.0 1.9  PHOS  --   --  3.4   GFR: Estimated Creatinine Clearance: 39.2 mL/min (A) (by C-G formula based on SCr of 1.17 mg/dL (H)). Liver Function Tests: Recent Labs  Lab 11/09/20 1720 11/11/20 0510  AST 15 13*  ALT 21 15  ALKPHOS 79 69  BILITOT 0.5 0.5  PROT 7.2 6.5  ALBUMIN 3.9 3.0*   Recent Labs  Lab 11/09/20 1720  LIPASE 20   No results for input(s): AMMONIA in the last 168 hours. Coagulation Profile: Recent Labs  Lab 11/11/20 0510  INR 1.3*   Cardiac Enzymes: No results for input(s): CKTOTAL, CKMB, CKMBINDEX, TROPONINI in the last 168 hours. BNP (last 3 results) No results for input(s): PROBNP in the last 8760 hours. HbA1C: No results for input(s): HGBA1C in the last 72 hours. CBG: Recent Labs  Lab 11/10/20 2158 11/11/20 0015 11/11/20 0204 11/11/20 0412 11/11/20 0800  GLUCAP 107* 154* 178* 164* 148*   Lipid Profile: No results for input(s): CHOL, HDL, LDLCALC, TRIG, CHOLHDL, LDLDIRECT in the last 72 hours. Thyroid Function Tests: No results for input(s): TSH, T4TOTAL, FREET4, T3FREE, THYROIDAB in the last  72 hours. Anemia Panel: No results for input(s): VITAMINB12, FOLATE, FERRITIN, TIBC, IRON, RETICCTPCT in the last 72 hours. Sepsis Labs: No results for input(s): PROCALCITON, LATICACIDVEN in the last 168 hours.  Recent Results (from the past 240 hour(s))  Resp Panel by RT-PCR (Flu A&B, Covid) Nasopharyngeal Swab     Status: None   Collection Time: 11/10/20  6:38 AM   Specimen: Nasopharyngeal Swab; Nasopharyngeal(NP) swabs in vial transport medium  Result Value Ref Range Status   SARS Coronavirus 2 by RT PCR NEGATIVE NEGATIVE Final    Comment: (NOTE) SARS-CoV-2 target nucleic acids are NOT DETECTED.  The SARS-CoV-2 RNA is generally detectable in upper respiratory specimens during  the acute phase of infection. The lowest concentration of SARS-CoV-2 viral copies this assay can detect is 138 copies/mL. A negative result does not preclude SARS-Cov-2 infection and should not be used as the sole basis for treatment or other patient management decisions. A negative result may occur with  improper specimen collection/handling, submission of specimen other than nasopharyngeal swab, presence of viral mutation(s) within the areas targeted by this assay, and inadequate number of viral copies(<138 copies/mL). A negative result must be combined with clinical observations, patient history, and epidemiological information. The expected result is Negative.  Fact Sheet for Patients:  EntrepreneurPulse.com.au  Fact Sheet for Healthcare Providers:  IncredibleEmployment.be  This test is no t yet approved or cleared by the [place] FDA and  has been authorized for detection and/or diagnosis of SARS-CoV-2 by FDA under an Emergency Use Authorization (EUA). This EUA will remain  in effect (meaning this test can be used) for the duration of the COVID-19 declaration under Section 564(b)(1) of the Act, 21 U.S.C.section 360bbb-3(b)(1), unless the authorization is  terminated  or revoked sooner.       Influenza A by PCR NEGATIVE NEGATIVE Final   Influenza B by PCR NEGATIVE NEGATIVE Final    Comment: (NOTE) The Xpert Xpress SARS-CoV-2/FLU/RSV plus assay is intended as an aid in the diagnosis of influenza from Nasopharyngeal swab specimens and should not be used as a sole basis for treatment. Nasal washings and aspirates are unacceptable for Xpert Xpress SARS-CoV-2/FLU/RSV testing.  Fact Sheet for Patients: EntrepreneurPulse.com.au  Fact Sheet for Healthcare Providers: IncredibleEmployment.be  This test is not yet approved or cleared by the [place] FDA and has been authorized for detection and/or diagnosis of SARS-CoV-2 by FDA under an Emergency Use Authorization (EUA). This EUA will remain in effect (meaning this test can be used) for the duration of the COVID-19 declaration under Section 564(b)(1) of the Act, 21 U.S.C. section 360bbb-3(b)(1), unless the authorization is terminated or revoked.  Performed at [facility], [address] [place], [place] [address]   Blood culture (routine x 2)     Status: None (Preliminary result)   Collection Time: 11/10/20  7:18 AM   Specimen: Right Antecubital; Blood  Result Value Ref Range Status   Specimen Description RIGHT ANTECUBITAL  Final   Special Requests   Final    BOTTLES DRAWN AEROBIC AND ANAEROBIC Blood Culture adequate volume   Culture   Final    NO GROWTH 1 DAY Performed at [facility], [address] [place], [place] [address]    Report Status PENDING  Incomplete  Blood culture (routine x 2)     Status: None (Preliminary result)   Collection Time: 11/10/20  7:19 AM   Specimen: BLOOD RIGHT WRIST  Result Value Ref Range Status   Specimen Description BLOOD RIGHT WRIST  Final   Special Requests   Final    Blood Culture adequate volume BOTTLES DRAWN AEROBIC AND ANAEROBIC   Culture   Final    NO GROWTH 1 DAY Performed at [facility],  [address], [place], [place] [address]    Report Status PENDING  Incomplete  MRSA Next Gen by PCR, Nasal     Status: None   Collection Time: 11/10/20  2:42 PM   Specimen: Nasal Mucosa; Nasal Swab  Result Value Ref Range Status   MRSA by PCR Next Gen NOT DETECTED NOT DETECTED Final    Comment: (NOTE) The GeneXpert MRSA Assay (FDA approved for NASAL specimens only), is one component  of a comprehensive MRSA colonization surveillance program. It is not intended to diagnose MRSA infection nor to guide or monitor treatment for MRSA infections. Test performance is not FDA approved in patients less than 1 years old. Performed at [facility], [address], [place], [place] [address]          Radiology Studies: CT ABDOMEN PELVIS W CONTRAST  Result Date: 11/10/2020 CLINICAL DATA:  Lower abdominal pain since yesterday with nausea and vomiting. EXAM: CT ABDOMEN AND PELVIS WITH CONTRAST TECHNIQUE: Multidetector CT imaging of the abdomen and pelvis was performed using the standard protocol following bolus administration of intravenous contrast. CONTRAST:  69m OMNIPAQUE IOHEXOL 350 MG/ML SOLN COMPARISON:  11/27/2017 FINDINGS: Lower chest: No acute pulmonary findings. Subpleural pulmonary nodule in the right middle lobe is likely a benign lymph node and unchanged since 2016. The heart is normal in size. No pericardial effusion. Aortic and coronary artery calcifications are noted. Hepatobiliary: No hepatic lesions or intrahepatic biliary dilatation. The gallbladder is unremarkable. No common bile duct dilatation. Pancreas: No mass, inflammation or ductal dilatation. Spleen: Normal size. No focal lesions. Adrenals/Urinary Tract: Small bilateral adrenal gland nodules are stable and likely benign adenomas. The kidneys are unremarkable. No renal lesions, renal calculi or hydronephrosis. The bladder is grossly normal. Stomach/Bowel: The stomach is unremarkable. The duodenum and proximal small bowel demonstrate mild  wall thickening. The colon also demonstrates thickening slight enhancement. No mass lesions or obstructive findings. Suspect enterocolitis. Vascular/Lymphatic: Age advanced atherosclerotic calcifications involving the aorta and iliac arteries. The branch vessels are patent. No dissection. The major venous structures are patent. Reproductive: The uterus is small. A calcified fundal fibroid is noted. No adnexal mass. Other: Small to moderate abdominal ascites. No omental disease or peritoneal surface lesions are identified. This is likely related to the enterocolitis. No peritoneal enhancement to suggest peritonitis. Musculoskeletal: Moderate osteopenia. Postoperative changes involving the lumbar spine. No acute bony findings or worrisome bone lesions. IMPRESSION: 1. CT findings suggest enterocolitis. 2. Small to moderate abdominal ascites. 3. Stable small bilateral adrenal gland nodules, likely benign adenomas. 4. Age advanced atherosclerotic calcifications involving the aorta and iliac arteries. Aortic Atherosclerosis (ICD10-I70.0). Electronically Signed   By: PMarijo SanesM.D.   On: 11/10/2020 05:39        Scheduled Meds:  amLODipine  10 mg Oral Daily   aspirin EC  81 mg Oral Daily   atorvastatin  80 mg Oral Daily   Chlorhexidine Gluconate Cloth  6 each Topical Daily   cholecalciferol  1,000 Units Oral Daily   enoxaparin (LOVENOX) injection  40 mg Subcutaneous Q24H   insulin aspart  0-20 Units Subcutaneous Q4H   insulin detemir  10 Units Subcutaneous BID   rOPINIRole  0.25 mg Oral QHS   Continuous Infusions:  piperacillin-tazobactam (ZOSYN)  IV 3.375 g (11/11/20 0539)     LOS: 1 day    Time spent: 35 minutes    Rockwell Zentz DDarleen Crocker DO [facility]  If 7PM-7AM, please contact night-coverage [email or web address] 11/11/2020, 9:31 AM

## 2020-11-11 NOTE — Progress Notes (Signed)
Inpatient Diabetes Program Recommendations  AACE/ADA: New Consensus Statement on Inpatient Glycemic Control (2015)  Target Ranges:  Prepandial:   less than 140 mg/dL      Peak postprandial:   less than 180 mg/dL (1-2 hours)      Critically ill patients:  140 - 180 mg/dL   Lab Results  Component Value Date   GLUCAP 148 (H) 11/11/2020   HGBA1C 9.0 (H) 10/25/2020   HGBA1C 9.1 (H) 10/25/2020    Review of Glycemic Control Results for NAMINE, ECCLESTON (MRN [id number]) as of 11/11/2020 09:09  Ref. Range 11/11/2020 00:15 11/11/2020 02:04 11/11/2020 04:12 11/11/2020 08:00  Glucose-Capillary Latest Ref Range: 70 - 99 mg/dL 154 (H) 178 (H) 164 (H) 148 (H)   Diabetes history: Type 2 dM Outpatient Diabetes medications: NPH 13 units BID, Novolog 8 units TID, Amaryl 1 mg QD Current orders for Inpatient glycemic control: Novolog 0-20 units Q4H, Semglee 10 units x1   Inpatient Diabetes Program Recommendations:    Consider adding Levemir 10 units BID.   Thanks, Bronson Curb, MSN, RNC-OB Diabetes Coordinator [phone] (8a-5p)

## 2020-11-11 NOTE — Progress Notes (Signed)
Notified dr of pt blood sugars dropping quickly and pt only on NSS.  Received order to change to D5NSS at 66m/hr. 034  Notified dr that pt blood sugars are now 178 and have inquired about changing fluids back to NSS or decreasing rate of D5NSS.  Awaiting response.

## 2020-11-12 DIAGNOSIS — K529 Noninfective gastroenteritis and colitis, unspecified: Secondary | ICD-10-CM | POA: Diagnosis not present

## 2020-11-12 LAB — CBC
HCT: 39.7 % (ref 36.0–46.0)
Hemoglobin: 12.8 g/dL (ref 12.0–15.0)
MCH: 29 pg (ref 26.0–34.0)
MCHC: 32.2 g/dL (ref 30.0–36.0)
MCV: 89.8 fL (ref 80.0–100.0)
Platelets: 270 10*3/uL (ref 150–400)
RBC: 4.42 MIL/uL (ref 3.87–5.11)
RDW: 14.5 % (ref 11.5–15.5)
WBC: 10.9 10*3/uL — ABNORMAL HIGH (ref 4.0–10.5)
nRBC: 0 % (ref 0.0–0.2)

## 2020-11-12 LAB — GLUCOSE, CAPILLARY
Glucose-Capillary: 118 mg/dL — ABNORMAL HIGH (ref 70–99)
Glucose-Capillary: 118 mg/dL — ABNORMAL HIGH (ref 70–99)
Glucose-Capillary: 121 mg/dL — ABNORMAL HIGH (ref 70–99)
Glucose-Capillary: 167 mg/dL — ABNORMAL HIGH (ref 70–99)
Glucose-Capillary: 200 mg/dL — ABNORMAL HIGH (ref 70–99)
Glucose-Capillary: 56 mg/dL — ABNORMAL LOW (ref 70–99)
Glucose-Capillary: 84 mg/dL (ref 70–99)

## 2020-11-12 LAB — BASIC METABOLIC PANEL
Anion gap: 4 — ABNORMAL LOW (ref 5–15)
BUN: 21 mg/dL (ref 8–23)
CO2: 23 mmol/L (ref 22–32)
Calcium: 8.5 mg/dL — ABNORMAL LOW (ref 8.9–10.3)
Chloride: 115 mmol/L — ABNORMAL HIGH (ref 98–111)
Creatinine, Ser: 0.89 mg/dL (ref 0.44–1.00)
GFR, Estimated: >60 mL/min (ref >60)
Glucose, Bld: 57 mg/dL — ABNORMAL LOW (ref 70–99)
Potassium: 3.3 mmol/L — ABNORMAL LOW (ref 3.5–5.1)
Sodium: 142 mmol/L (ref 135–145)

## 2020-11-12 LAB — MAGNESIUM: Magnesium: 2.2 mg/dL (ref 1.7–2.4)

## 2020-11-12 MED ORDER — POTASSIUM CHLORIDE CRYS ER 20 MEQ PO TBCR
40.0000 meq | EXTENDED_RELEASE_TABLET | Freq: Once | ORAL | Status: AC
  Administered 2020-11-12: 40 meq via ORAL
  Filled 2020-11-12: qty 2

## 2020-11-12 NOTE — Progress Notes (Signed)
PROGRESS NOTE    Cheyenne [name]  [id number] DOB: 02-09-1951 DOA: 11/10/2020 PCP: Toni Arthurs, PA   Brief Narrative:   Cheyenne [name] is a 70 y.o. female with medical history significant for type II DM, CVA, hypertension, hyperlipidemia, COPD, tobacco use who presents to the emergency department due to 2 day onset of constant lower abdominal pain.  She was also noted to have some associated nausea and vomiting and was admitted for enterocolitis based on CT findings.  She has been started on IV fluid as well as IV Zosyn.  She is also noted to have hyperglycemia secondary to type 2 diabetes at this time.  Assessment & Plan:   Principal Problem:   Enterocolitis Active Problems:   Essential hypertension   Cerebrovascular accident (CVA) ([place])   Abdominal pain   Nausea & vomiting   AKI (acute kidney injury) ([place])   Hyperglycemia due to diabetes mellitus (HCC)   Leukocytosis   Mixed hyperlipidemia   Tobacco use   Asthma   SIRS (systemic inflammatory response syndrome) (HCC)   Restless leg syndrome   Vitamin D deficiency   Enteritis   Sepsis present on admission secondary to enterocolitis -Advance diet to soft -IV Zosyn -Blood cultures with no growth noted thus far -Monitor leukocytosis which is improving -Consider repeat CT by a.m. depending on symptoms   AKI-improving -Baseline creatinine 0.7-0.9 -Continue IV fluid hydration, likely prerenal -Hold losartan -Monitor strict I's and O's  Mild hypokalemia -Replete and recheck in am   Hyperglycemia secondary to type 2 diabetes-improved - 10 units Levemir twice daily as ordered and SSI -Okay to transfer to telemetry   Essential hypertension -IV labetalol added for poor BP control -Continue Norvasc -Hold Cozaar given AKI   History of CVA/dyslipidemia -Lipitor -Continue aspirin   Restless leg syndrome -Requip   History of COPD with ongoing tobacco abuse -Counseled on cessation -Albuterol as needed for  shortness of breath or wheezing     DVT prophylaxis: Lovenox Code Status: Full Family Communication: Discussed with granddaughter 9/8 Disposition Plan:  Status is: Inpatient   Remains inpatient appropriate because:IV treatments appropriate due to intensity of illness or inability to take PO and Inpatient level of care appropriate due to severity of illness   Dispo: The patient is from: Home              Anticipated d/c is to: Home              Patient currently is not medically stable to d/c.              Difficult to place patient No     Consultants:  None   Procedures:  See below  Antimicrobials:  Anti-infectives (From admission, onward)    Start     Dose/Rate Route Frequency Ordered Stop   11/10/20 1400  piperacillin-tazobactam (ZOSYN) IVPB 3.375 g        3.375 g 12.5 mL/hr over 240 Minutes Intravenous Every 8 hours 11/10/20 0732     11/10/20 1200  piperacillin-tazobactam (ZOSYN) IVPB 3.375 g  Status:  Discontinued        3.375 g 100 mL/hr over 30 Minutes Intravenous Every 6 hours 11/10/20 0654 11/10/20 0732   11/10/20 0615  piperacillin-tazobactam (ZOSYN) IVPB 3.375 g        3.375 g 12.5 mL/hr over 240 Minutes Intravenous  Once 11/10/20 0603 11/10/20 1038       Subjective: Patient seen and evaluated today with no new acute complaints or  concerns. No acute concerns or events noted overnight. She is tolerating clears, but continues to have abdominal pain.   Objective: Vitals:   11/12/20 0900 11/12/20 1000 11/12/20 1100 11/12/20 1132  BP: (!) 161/70 131/61 (!) 134/59   Pulse: (!) 110 97 98 (!) 103  Resp: (!) 26 20 (!) 21 (!) 23  Temp:    98.2 F (36.8 C)  TempSrc:    Oral  SpO2: 96% 96% 97% 95%  Weight:      Height:        Intake/Output Summary (Last 24 hours) at 11/12/2020 1135 Last data filed at 11/12/2020 0600 Gross per 24 hour  Intake 198.17 ml  Output 325 ml  Net -126.83 ml   Filed Weights   11/10/20 0356  Weight: 63.5 kg     Examination:  General exam: Appears calm and comfortable  Respiratory system: Clear to auscultation. Respiratory effort normal. Cardiovascular system: S1 & S2 heard, RRR.  Gastrointestinal system: Abdomen is tender to palpation throughout Central nervous system: Alert and awake Extremities: No edema Skin: No significant lesions noted Psychiatry: Flat affect.    Data Reviewed: I have personally reviewed following labs and imaging studies  CBC: Recent Labs  Lab 11/09/20 1720 11/10/20 1048 11/11/20 0510 11/12/20 0322  WBC 16.6* 16.0* 15.4* 10.9*  HGB 14.5 14.1 12.4 12.8  HCT 43.8 43.3 38.5 39.7  MCV 87.3 89.1 90.0 89.8  PLT 331 404* 295 [id number]   Basic Metabolic Panel: Recent Labs  Lab 11/09/20 1720 11/10/20 1048 11/11/20 0510 11/12/20 0322  NA 136 133* 142 142  K 4.1 4.2 4.1 3.3*  CL 102 103 112* 115*  CO2 24 16* 22 23  GLUCOSE 357* 579* 155* 57*  BUN 22 38* 27* 21  CREATININE 1.46* 3.11* 1.17* 0.89  CALCIUM 9.4 9.6 9.2 8.5*  MG  --  2.0 1.9 2.2  PHOS  --   --  3.4  --    GFR: Estimated Creatinine Clearance: 51.5 mL/min (by C-G formula based on SCr of 0.89 mg/dL). Liver Function Tests: Recent Labs  Lab 11/09/20 1720 11/11/20 0510  AST 15 13*  ALT 21 15  ALKPHOS 79 69  BILITOT 0.5 0.5  PROT 7.2 6.5  ALBUMIN 3.9 3.0*   Recent Labs  Lab 11/09/20 1720  LIPASE 20   No results for input(s): AMMONIA in the last 168 hours. Coagulation Profile: Recent Labs  Lab 11/11/20 0510  INR 1.3*   Cardiac Enzymes: No results for input(s): CKTOTAL, CKMB, CKMBINDEX, TROPONINI in the last 168 hours. BNP (last 3 results) No results for input(s): PROBNP in the last 8760 hours. HbA1C: No results for input(s): HGBA1C in the last 72 hours. CBG: Recent Labs  Lab 11/11/20 2004 11/12/20 0404 11/12/20 0440 11/12/20 0740 11/12/20 1133  GLUCAP 107* 56* 167* 121* 118*   Lipid Profile: No results for input(s): CHOL, HDL, LDLCALC, TRIG, CHOLHDL, LDLDIRECT in the  last 72 hours. Thyroid Function Tests: No results for input(s): TSH, T4TOTAL, FREET4, T3FREE, THYROIDAB in the last 72 hours. Anemia Panel: No results for input(s): VITAMINB12, FOLATE, FERRITIN, TIBC, IRON, RETICCTPCT in the last 72 hours. Sepsis Labs: No results for input(s): PROCALCITON, LATICACIDVEN in the last 168 hours.  Recent Results (from the past 240 hour(s))  Resp Panel by RT-PCR (Flu A&B, Covid) Nasopharyngeal Swab     Status: None   Collection Time: 11/10/20  6:38 AM   Specimen: Nasopharyngeal Swab; Nasopharyngeal(NP) swabs in vial transport medium  Result Value Ref Range Status  SARS Coronavirus 2 by RT PCR NEGATIVE NEGATIVE Final    Comment: (NOTE) SARS-CoV-2 target nucleic acids are NOT DETECTED.  The SARS-CoV-2 RNA is generally detectable in upper respiratory specimens during the acute phase of infection. The lowest concentration of SARS-CoV-2 viral copies this assay can detect is 138 copies/mL. A negative result does not preclude SARS-Cov-2 infection and should not be used as the sole basis for treatment or other patient management decisions. A negative result may occur with  improper specimen collection/handling, submission of specimen other than nasopharyngeal swab, presence of viral mutation(s) within the areas targeted by this assay, and inadequate number of viral copies(<138 copies/mL). A negative result must be combined with clinical observations, patient history, and epidemiological information. The expected result is Negative.  Fact Sheet for Patients:  EntrepreneurPulse.com.au  Fact Sheet for Healthcare Providers:  IncredibleEmployment.be  This test is no t yet approved or cleared by the [place] FDA and  has been authorized for detection and/or diagnosis of SARS-CoV-2 by FDA under an Emergency Use Authorization (EUA). This EUA will remain  in effect (meaning this test can be used) for the duration of  the COVID-19 declaration under Section 564(b)(1) of the Act, 21 U.S.C.section 360bbb-3(b)(1), unless the authorization is terminated  or revoked sooner.       Influenza A by PCR NEGATIVE NEGATIVE Final   Influenza B by PCR NEGATIVE NEGATIVE Final    Comment: (NOTE) The Xpert Xpress SARS-CoV-2/FLU/RSV plus assay is intended as an aid in the diagnosis of influenza from Nasopharyngeal swab specimens and should not be used as a sole basis for treatment. Nasal washings and aspirates are unacceptable for Xpert Xpress SARS-CoV-2/FLU/RSV testing.  Fact Sheet for Patients: EntrepreneurPulse.com.au  Fact Sheet for Healthcare Providers: IncredibleEmployment.be  This test is not yet approved or cleared by the [place] FDA and has been authorized for detection and/or diagnosis of SARS-CoV-2 by FDA under an Emergency Use Authorization (EUA). This EUA will remain in effect (meaning this test can be used) for the duration of the COVID-19 declaration under Section 564(b)(1) of the Act, 21 U.S.C. section 360bbb-3(b)(1), unless the authorization is terminated or revoked.  Performed at [facility], [address] [place], [place] [address]   Blood culture (routine x 2)     Status: None (Preliminary result)   Collection Time: 11/10/20  7:18 AM   Specimen: Right Antecubital; Blood  Result Value Ref Range Status   Specimen Description RIGHT ANTECUBITAL  Final   Special Requests   Final    BOTTLES DRAWN AEROBIC AND ANAEROBIC Blood Culture adequate volume   Culture   Final    NO GROWTH 2 DAYS Performed at [facility], [address], [place], [place] [address]    Report Status PENDING  Incomplete  Blood culture (routine x 2)     Status: None (Preliminary result)   Collection Time: 11/10/20  7:19 AM   Specimen: BLOOD RIGHT WRIST  Result Value Ref Range Status   Specimen Description BLOOD RIGHT WRIST  Final   Special Requests   Final    Blood Culture  adequate volume BOTTLES DRAWN AEROBIC AND ANAEROBIC   Culture   Final    NO GROWTH 2 DAYS Performed at [facility], [address], [place], [place] [address]    Report Status PENDING  Incomplete  MRSA Next Gen by PCR, Nasal     Status: None   Collection Time: 11/10/20  2:42 PM   Specimen: Nasal Mucosa; Nasal Swab  Result Value  Ref Range Status   MRSA by PCR Next Gen NOT DETECTED NOT DETECTED Final    Comment: (NOTE) The GeneXpert MRSA Assay (FDA approved for NASAL specimens only), is one component of a comprehensive MRSA colonization surveillance program. It is not intended to diagnose MRSA infection nor to guide or monitor treatment for MRSA infections. Test performance is not FDA approved in patients less than 40 years old. Performed at [facility], [address] [place], [place] [address]          Radiology Studies: No results found.      Scheduled Meds:  amLODipine  10 mg Oral Daily   aspirin EC  81 mg Oral Daily   atorvastatin  80 mg Oral Daily   Chlorhexidine Gluconate Cloth  6 each Topical Daily   cholecalciferol  1,000 Units Oral Daily   enoxaparin (LOVENOX) injection  40 mg Subcutaneous Q24H   insulin aspart  0-20 Units Subcutaneous Q4H   insulin detemir  10 Units Subcutaneous BID   rOPINIRole  0.25 mg Oral QHS   Continuous Infusions:  piperacillin-tazobactam (ZOSYN)  IV 3.375 g (11/12/20 0558)     LOS: 2 days    Time spent: 35 minutes    Aanika Defoor Darleen Crocker, DO [facility]  If 7PM-7AM, please contact night-coverage [email or web address] 11/12/2020, 11:35 AM

## 2020-11-13 DIAGNOSIS — K529 Noninfective gastroenteritis and colitis, unspecified: Secondary | ICD-10-CM | POA: Diagnosis not present

## 2020-11-13 LAB — BASIC METABOLIC PANEL
Anion gap: 7 (ref 5–15)
BUN: 20 mg/dL (ref 8–23)
CO2: 21 mmol/L — ABNORMAL LOW (ref 22–32)
Calcium: 8.2 mg/dL — ABNORMAL LOW (ref 8.9–10.3)
Chloride: 116 mmol/L — ABNORMAL HIGH (ref 98–111)
Creatinine, Ser: 0.77 mg/dL (ref 0.44–1.00)
GFR, Estimated: >60 mL/min (ref >60)
Glucose, Bld: 66 mg/dL — ABNORMAL LOW (ref 70–99)
Potassium: 3.3 mmol/L — ABNORMAL LOW (ref 3.5–5.1)
Sodium: 144 mmol/L (ref 135–145)

## 2020-11-13 LAB — C DIFFICILE (CDIFF) QUICK SCRN (NO PCR REFLEX)
C Diff antigen: NEGATIVE
C Diff interpretation: NOT DETECTED
C Diff toxin: NEGATIVE

## 2020-11-13 LAB — CBC
HCT: 35 % — ABNORMAL LOW (ref 36.0–46.0)
Hemoglobin: 11 g/dL — ABNORMAL LOW (ref 12.0–15.0)
MCH: 28.4 pg (ref 26.0–34.0)
MCHC: 31.4 g/dL (ref 30.0–36.0)
MCV: 90.2 fL (ref 80.0–100.0)
Platelets: 283 10*3/uL (ref 150–400)
RBC: 3.88 MIL/uL (ref 3.87–5.11)
RDW: 14.7 % (ref 11.5–15.5)
WBC: 6.7 10*3/uL (ref 4.0–10.5)
nRBC: 0 % (ref 0.0–0.2)

## 2020-11-13 LAB — GLUCOSE, CAPILLARY
Glucose-Capillary: 60 mg/dL — ABNORMAL LOW (ref 70–99)
Glucose-Capillary: 176 mg/dL — ABNORMAL HIGH (ref 70–99)
Glucose-Capillary: 59 mg/dL — ABNORMAL LOW (ref 70–99)
Glucose-Capillary: 141 mg/dL — ABNORMAL HIGH (ref 70–99)
Glucose-Capillary: 133 mg/dL — ABNORMAL HIGH (ref 70–99)
Glucose-Capillary: 118 mg/dL — ABNORMAL HIGH (ref 70–99)
Glucose-Capillary: 144 mg/dL — ABNORMAL HIGH (ref 70–99)

## 2020-11-13 LAB — MAGNESIUM: Magnesium: 2.2 mg/dL (ref 1.7–2.4)

## 2020-11-13 MED ORDER — CHLORTHALIDONE 25 MG PO TABS
50.0000 mg | ORAL_TABLET | Freq: Every morning | ORAL | Status: DC
  Administered 2020-11-13 – 2020-11-15 (×3): 50 mg via ORAL
  Filled 2020-11-13 (×2): qty 1

## 2020-11-13 MED ORDER — ESCITALOPRAM OXALATE 10 MG PO TABS
10.0000 mg | ORAL_TABLET | Freq: Every day | ORAL | Status: DC
  Administered 2020-11-13 – 2020-11-22 (×10): 10 mg via ORAL
  Filled 2020-11-13 (×10): qty 1

## 2020-11-13 MED ORDER — LOSARTAN POTASSIUM 50 MG PO TABS
50.0000 mg | ORAL_TABLET | Freq: Two times a day (BID) | ORAL | Status: DC
  Administered 2020-11-13 – 2020-11-15 (×5): 50 mg via ORAL
  Filled 2020-11-13 (×5): qty 1

## 2020-11-13 MED ORDER — POTASSIUM CHLORIDE CRYS ER 20 MEQ PO TBCR
40.0000 meq | EXTENDED_RELEASE_TABLET | Freq: Once | ORAL | Status: AC
  Administered 2020-11-13: 40 meq via ORAL
  Filled 2020-11-13: qty 2

## 2020-11-13 MED ORDER — POTASSIUM CHLORIDE 10 MEQ/100ML IV SOLN
10.0000 meq | INTRAVENOUS | Status: AC
  Administered 2020-11-13 (×4): 10 meq via INTRAVENOUS
  Filled 2020-11-13 (×4): qty 100

## 2020-11-13 MED ORDER — HALOPERIDOL LACTATE 5 MG/ML IJ SOLN: 2.0000 mg | Freq: Four times a day (QID) | INTRAMUSCULAR | Status: DC | PRN

## 2020-11-13 NOTE — Plan of Care (Signed)

## 2020-11-13 NOTE — Progress Notes (Signed)
PROGRESS NOTE    Cheyenne [name]  [id number] DOB: 30-Dec-1950 DOA: 11/10/2020 PCP: Toni Arthurs, PA   Brief Narrative:   Cheyenne [name] is a 70 y.o. female with medical history significant for type II DM, CVA, hypertension, hyperlipidemia, COPD, tobacco use who presents to the emergency department due to 2 day onset of constant lower abdominal pain.  She was also noted to have some associated nausea and vomiting and was admitted for enterocolitis based on CT findings.  She has been started on IV fluid as well as IV Zosyn.  She was noted to have hyperglycemia secondary to type 2 diabetes which has now resolved.  She appears to be tolerating more of her soft diet slowly.  Assessment & Plan:   Principal Problem:   Enterocolitis Active Problems:   Essential hypertension   Cerebrovascular accident (CVA) ([place])   Abdominal pain   Nausea & vomiting   AKI (acute kidney injury) ([place])   Hyperglycemia due to diabetes mellitus (HCC)   Leukocytosis   Mixed hyperlipidemia   Tobacco use   Asthma   SIRS (systemic inflammatory response syndrome) (HCC)   Restless leg syndrome   Vitamin D deficiency   Enteritis   Sepsis present on admission secondary to enterocolitis -Advance diet to soft and see how well this is tolerated -IV Zosyn -Blood cultures with no growth noted thus far -Monitor leukocytosis which is improving   AKI-resolved -Baseline creatinine 0.7-0.9 -Continue IV fluid hydration, likely prerenal -Hold losartan -Monitor strict I's and O's   Mild hypokalemia -Replete and recheck in am   Hyperglycemia secondary to type 2 diabetes-improved - 10 units Levemir twice daily as ordered and SSI -Okay to transfer to telemetry   Essential hypertension -IV labetalol added for poor BP control -Continue Norvasc -Okay to resume Cozaar   History of CVA/dyslipidemia -Lipitor -Continue aspirin   Restless leg syndrome -Requip   History of COPD with ongoing tobacco  abuse -Counseled on cessation -Albuterol as needed for shortness of breath or wheezing     DVT prophylaxis: Lovenox Code Status: Full Family Communication: Discussed with granddaughter 9/8 Disposition Plan:  Status is: Inpatient   Remains inpatient appropriate because:IV treatments appropriate due to intensity of illness or inability to take PO and Inpatient level of care appropriate due to severity of illness   Dispo: The patient is from: Home              Anticipated d/c is to: Home              Patient currently is not medically stable to d/c.              Difficult to place patient No     Consultants:  None   Procedures:  See below  Antimicrobials:  Anti-infectives (From admission, onward)    Start     Dose/Rate Route Frequency Ordered Stop   11/10/20 1400  piperacillin-tazobactam (ZOSYN) IVPB 3.375 g        3.375 g 12.5 mL/hr over 240 Minutes Intravenous Every 8 hours 11/10/20 0732     11/10/20 1200  piperacillin-tazobactam (ZOSYN) IVPB 3.375 g  Status:  Discontinued        3.375 g 100 mL/hr over 30 Minutes Intravenous Every 6 hours 11/10/20 0654 11/10/20 0732   11/10/20 0615  piperacillin-tazobactam (ZOSYN) IVPB 3.375 g        3.375 g 12.5 mL/hr over 240 Minutes Intravenous  Once 11/10/20 0603 11/10/20 1038  Subjective: Patient seen and evaluated today with improving abdominal pain and bowel movement noted.  She still has not tried much of her diet.  No acute concerns or events noted overnight.  Objective: Vitals:   11/13/20 0900 11/13/20 1000 11/13/20 1200 11/13/20 1219  BP: (!) 170/78 (!) 186/85 (!) 174/88   Pulse: 89 96 91   Resp: (!) 23 (!) 24 (!) 22   Temp: (!) 97.5 F (36.4 C)   97.6 F (36.4 C)  TempSrc:    Oral  SpO2: 94% 92% 93%   Weight:      Height:        Intake/Output Summary (Last 24 hours) at 11/13/2020 1236 Last data filed at 11/13/2020 0558 Gross per 24 hour  Intake 149.67 ml  Output 875 ml  Net -725.33 ml   Filed Weights    11/10/20 0356  Weight: 63.5 kg    Examination:  General exam: Appears calm and comfortable  Respiratory system: Clear to auscultation. Respiratory effort normal. Cardiovascular system: S1 & S2 heard, RRR.  Gastrointestinal system: Abdomen is soft, tender to palpation in all 4 quadrants. Central nervous system: Alert and awake Extremities: No edema Skin: No significant lesions noted Psychiatry: Flat affect.    Data Reviewed: I have personally reviewed following labs and imaging studies  CBC: Recent Labs  Lab 11/09/20 1720 11/10/20 1048 11/11/20 0510 11/12/20 0322 11/13/20 0401  WBC 16.6* 16.0* 15.4* 10.9* 6.7  HGB 14.5 14.1 12.4 12.8 11.0*  HCT 43.8 43.3 38.5 39.7 35.0*  MCV 87.3 89.1 90.0 89.8 90.2  PLT 331 404* 295 270 [id number]   Basic Metabolic Panel: Recent Labs  Lab 11/09/20 1720 11/10/20 1048 11/11/20 0510 11/12/20 0322 11/13/20 0401  NA 136 133* 142 142 144  K 4.1 4.2 4.1 3.3* 3.3*  CL 102 103 112* 115* 116*  CO2 24 16* 22 23 21*  GLUCOSE 357* 579* 155* 57* 66*  BUN 22 38* 27* 21 20  CREATININE 1.46* 3.11* 1.17* 0.89 0.77  CALCIUM 9.4 9.6 9.2 8.5* 8.2*  MG  --  2.0 1.9 2.2 2.2  PHOS  --   --  3.4  --   --    GFR: Estimated Creatinine Clearance: 57.3 mL/min (by C-G formula based on SCr of 0.77 mg/dL). Liver Function Tests: Recent Labs  Lab 11/09/20 1720 11/11/20 0510  AST 15 13*  ALT 21 15  ALKPHOS 79 69  BILITOT 0.5 0.5  PROT 7.2 6.5  ALBUMIN 3.9 3.0*   Recent Labs  Lab 11/09/20 1720  LIPASE 20   No results for input(s): AMMONIA in the last 168 hours. Coagulation Profile: Recent Labs  Lab 11/11/20 0510  INR 1.3*   Cardiac Enzymes: No results for input(s): CKTOTAL, CKMB, CKMBINDEX, TROPONINI in the last 168 hours. BNP (last 3 results) No results for input(s): PROBNP in the last 8760 hours. HbA1C: No results for input(s): HGBA1C in the last 72 hours. CBG: Recent Labs  Lab 11/13/20 0404 11/13/20 0443 11/13/20 0516 11/13/20 0828  11/13/20 1134  GLUCAP 60* 59* 176* 141* 133*   Lipid Profile: No results for input(s): CHOL, HDL, LDLCALC, TRIG, CHOLHDL, LDLDIRECT in the last 72 hours. Thyroid Function Tests: No results for input(s): TSH, T4TOTAL, FREET4, T3FREE, THYROIDAB in the last 72 hours. Anemia Panel: No results for input(s): VITAMINB12, FOLATE, FERRITIN, TIBC, IRON, RETICCTPCT in the last 72 hours. Sepsis Labs: No results for input(s): PROCALCITON, LATICACIDVEN in the last 168 hours.  Recent Results (from the past 240 hour(s))  Resp  Panel by RT-PCR (Flu A&B, Covid) Nasopharyngeal Swab     Status: None   Collection Time: 11/10/20  6:38 AM   Specimen: Nasopharyngeal Swab; Nasopharyngeal(NP) swabs in vial transport medium  Result Value Ref Range Status   SARS Coronavirus 2 by RT PCR NEGATIVE NEGATIVE Final    Comment: (NOTE) SARS-CoV-2 target nucleic acids are NOT DETECTED.  The SARS-CoV-2 RNA is generally detectable in upper respiratory specimens during the acute phase of infection. The lowest concentration of SARS-CoV-2 viral copies this assay can detect is 138 copies/mL. A negative result does not preclude SARS-Cov-2 infection and should not be used as the sole basis for treatment or other patient management decisions. A negative result may occur with  improper specimen collection/handling, submission of specimen other than nasopharyngeal swab, presence of viral mutation(s) within the areas targeted by this assay, and inadequate number of viral copies(<138 copies/mL). A negative result must be combined with clinical observations, patient history, and epidemiological information. The expected result is Negative.  Fact Sheet for Patients:  EntrepreneurPulse.com.au  Fact Sheet for Healthcare Providers:  IncredibleEmployment.be  This test is no t yet approved or cleared by the [place] FDA and  has been authorized for detection and/or diagnosis of SARS-CoV-2  by FDA under an Emergency Use Authorization (EUA). This EUA will remain  in effect (meaning this test can be used) for the duration of the COVID-19 declaration under Section 564(b)(1) of the Act, 21 U.S.C.section 360bbb-3(b)(1), unless the authorization is terminated  or revoked sooner.       Influenza A by PCR NEGATIVE NEGATIVE Final   Influenza B by PCR NEGATIVE NEGATIVE Final    Comment: (NOTE) The Xpert Xpress SARS-CoV-2/FLU/RSV plus assay is intended as an aid in the diagnosis of influenza from Nasopharyngeal swab specimens and should not be used as a sole basis for treatment. Nasal washings and aspirates are unacceptable for Xpert Xpress SARS-CoV-2/FLU/RSV testing.  Fact Sheet for Patients: EntrepreneurPulse.com.au  Fact Sheet for Healthcare Providers: IncredibleEmployment.be  This test is not yet approved or cleared by the [place] FDA and has been authorized for detection and/or diagnosis of SARS-CoV-2 by FDA under an Emergency Use Authorization (EUA). This EUA will remain in effect (meaning this test can be used) for the duration of the COVID-19 declaration under Section 564(b)(1) of the Act, 21 U.S.C. section 360bbb-3(b)(1), unless the authorization is terminated or revoked.  Performed at [facility], [address], [place], [place] [address]   Blood culture (routine x 2)     Status: None (Preliminary result)   Collection Time: 11/10/20  7:18 AM   Specimen: Right Antecubital; Blood  Result Value Ref Range Status   Specimen Description RIGHT ANTECUBITAL  Final   Special Requests   Final    BOTTLES DRAWN AEROBIC AND ANAEROBIC Blood Culture adequate volume   Culture   Final    NO GROWTH 3 DAYS Performed at [facility], [address] [place], [place] [address]    Report Status PENDING  Incomplete  Blood culture (routine x 2)     Status: None (Preliminary result)   Collection Time: 11/10/20  7:19 AM   Specimen: BLOOD  RIGHT WRIST  Result Value Ref Range Status   Specimen Description BLOOD RIGHT WRIST  Final   Special Requests   Final    Blood Culture adequate volume BOTTLES DRAWN AEROBIC AND ANAEROBIC   Culture   Final    NO GROWTH 3 DAYS Performed at [facility], [address] [place],  [place] [address]    Report Status PENDING  Incomplete  MRSA Next Gen by PCR, Nasal     Status: None   Collection Time: 11/10/20  2:42 PM   Specimen: Nasal Mucosa; Nasal Swab  Result Value Ref Range Status   MRSA by PCR Next Gen NOT DETECTED NOT DETECTED Final    Comment: (NOTE) The GeneXpert MRSA Assay (FDA approved for NASAL specimens only), is one component of a comprehensive MRSA colonization surveillance program. It is not intended to diagnose MRSA infection nor to guide or monitor treatment for MRSA infections. Test performance is not FDA approved in patients less than 14 years old. Performed at [facility], [address] [place], [place] [address]          Radiology Studies: No results found.      Scheduled Meds:  amLODipine  10 mg Oral Daily   aspirin EC  81 mg Oral Daily   atorvastatin  80 mg Oral Daily   Chlorhexidine Gluconate Cloth  6 each Topical Daily   cholecalciferol  1,000 Units Oral Daily   enoxaparin (LOVENOX) injection  40 mg Subcutaneous Q24H   insulin aspart  0-20 Units Subcutaneous Q4H   insulin detemir  10 Units Subcutaneous BID   rOPINIRole  0.25 mg Oral QHS   Continuous Infusions:  piperacillin-tazobactam (ZOSYN)  IV 3.375 g (11/13/20 0609)   potassium chloride 10 mEq (11/13/20 1144)     LOS: 3 days    Time spent: 35 minutes    Jaylynne Birkhead Darleen Crocker, DO [facility]  If 7PM-7AM, please contact night-coverage [email or web address] 11/13/2020, 12:36 PM

## 2020-11-13 NOTE — Progress Notes (Signed)
This RN entered patient's room around 0930 to administer medications. Each medication was reviewed with the patient and she became agitated stating "I know what ya'll are trying to do to me" and refused to take medications. Attempted to reorient patient, however she began to say that "this is not a hospital, you are not nurses". Attempted to calm patient with no success. Dr. Manuella Ghazi to bedside. Order received for PRN Haldol if agitation worsened. Granddaughter, Anderson Malta, was contacted and spoke with patient on the phone as well as this Therapist, sports. Continued reorientation with the patient and addressed safety concerns. Patient calmed down after speaking with granddaughter and is aware of environment and plan of care. Pt now agreeable to take medications and is calm and cooperative.

## 2020-11-14 ENCOUNTER — Inpatient Hospital Stay (HOSPITAL_COMMUNITY): Payer: Medicare HMO

## 2020-11-14 DIAGNOSIS — K529 Noninfective gastroenteritis and colitis, unspecified: Secondary | ICD-10-CM | POA: Diagnosis not present

## 2020-11-14 LAB — GASTROINTESTINAL PANEL BY PCR, STOOL (REPLACES STOOL CULTURE)

## 2020-11-14 LAB — GLUCOSE, CAPILLARY
Glucose-Capillary: 109 mg/dL — ABNORMAL HIGH (ref 70–99)
Glucose-Capillary: 125 mg/dL — ABNORMAL HIGH (ref 70–99)
Glucose-Capillary: 129 mg/dL — ABNORMAL HIGH (ref 70–99)
Glucose-Capillary: 151 mg/dL — ABNORMAL HIGH (ref 70–99)
Glucose-Capillary: 169 mg/dL — ABNORMAL HIGH (ref 70–99)
Glucose-Capillary: 223 mg/dL — ABNORMAL HIGH (ref 70–99)
Glucose-Capillary: 225 mg/dL — ABNORMAL HIGH (ref 70–99)
Glucose-Capillary: 60 mg/dL — ABNORMAL LOW (ref 70–99)

## 2020-11-14 LAB — BASIC METABOLIC PANEL
Anion gap: 14 (ref 5–15)
BUN: 18 mg/dL (ref 8–23)
CO2: 18 mmol/L — ABNORMAL LOW (ref 22–32)
Calcium: 8.6 mg/dL — ABNORMAL LOW (ref 8.9–10.3)
Chloride: 109 mmol/L (ref 98–111)
Creatinine, Ser: 1.19 mg/dL — ABNORMAL HIGH (ref 0.44–1.00)
GFR, Estimated: 49 mL/min — ABNORMAL LOW (ref >60)
Glucose, Bld: 108 mg/dL — ABNORMAL HIGH (ref 70–99)
Potassium: 4 mmol/L (ref 3.5–5.1)
Sodium: 141 mmol/L (ref 135–145)

## 2020-11-14 LAB — CBC
HCT: 40.4 % (ref 36.0–46.0)
Hemoglobin: 13.3 g/dL (ref 12.0–15.0)
MCH: 28.9 pg (ref 26.0–34.0)
MCHC: 32.9 g/dL (ref 30.0–36.0)
MCV: 87.6 fL (ref 80.0–100.0)
Platelets: 336 10*3/uL (ref 150–400)
RBC: 4.61 MIL/uL (ref 3.87–5.11)
RDW: 14.4 % (ref 11.5–15.5)
WBC: 8.1 10*3/uL (ref 4.0–10.5)
nRBC: 0 % (ref 0.0–0.2)

## 2020-11-14 LAB — MAGNESIUM: Magnesium: 2 mg/dL (ref 1.7–2.4)

## 2020-11-14 IMAGING — CT CT ABD-PELV W/ CM
2 of 5 series · 15 of 46 positions shown, 17 images · IV contrast (Omnipaque or Isovue)
Comparison: CT abdomen/pelvis [DATE], [DATE]

CLINICAL DATA: Worsening abdominal pain with nausea, vomiting

EXAM:
CT ABDOMEN AND PELVIS WITH CONTRAST
TECHNIQUE: Multidetector CT imaging of the abdomen and pelvis was performed
using the standard protocol following bolus administration of
intravenous contrast.
CONTRAST:  80mL OMNIPAQUE IOHEXOL 350 MG/ML SOLN

[Series 2: axial st · axial · 0.79mm/px · z∈[+684,+1109]mm · 12 of 97 slices shown, 14 images]
[im 6/97  soft-tissue]
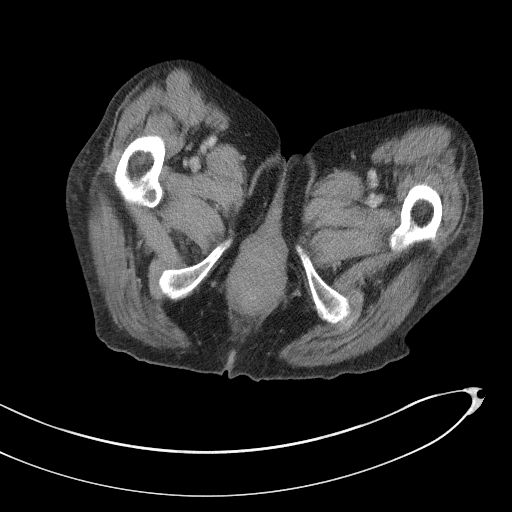
[im 6/97  bone]
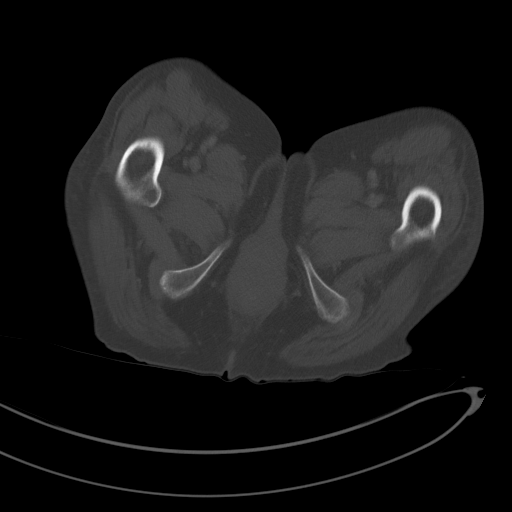
[im 16/97  soft-tissue]
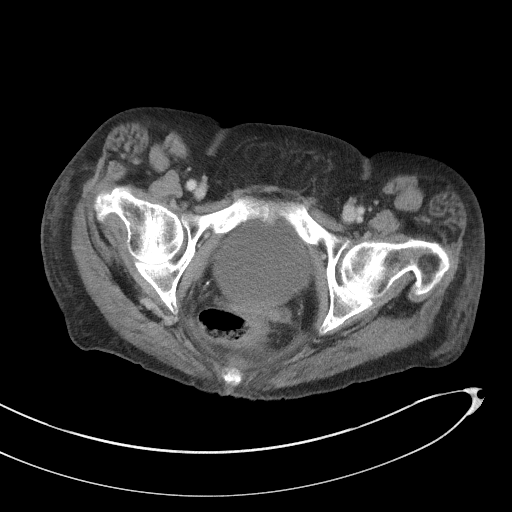
[im 21/97  soft-tissue]
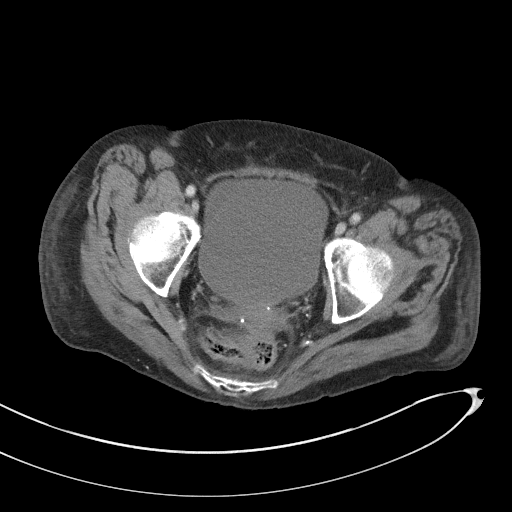
[im 31/97  soft-tissue]
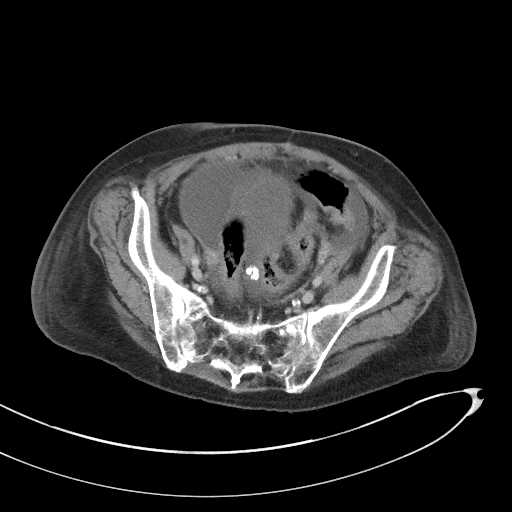
[im 36/97  soft-tissue]
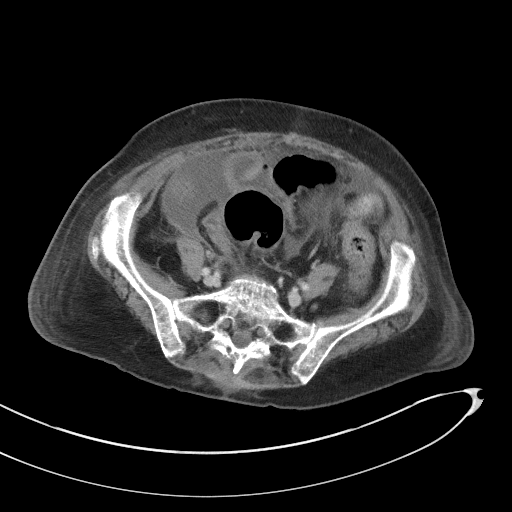
[im 46/97  soft-tissue]
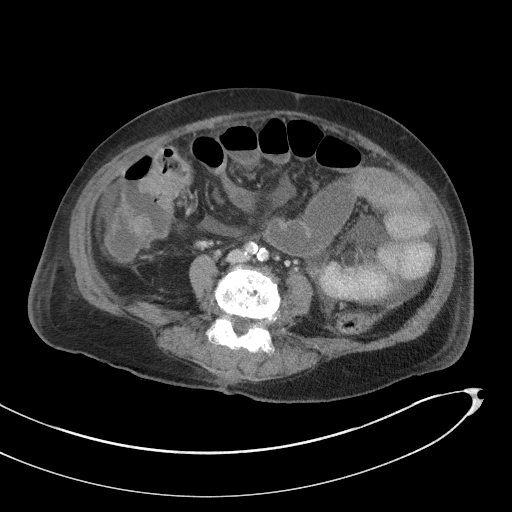
[im 51/97  soft-tissue]
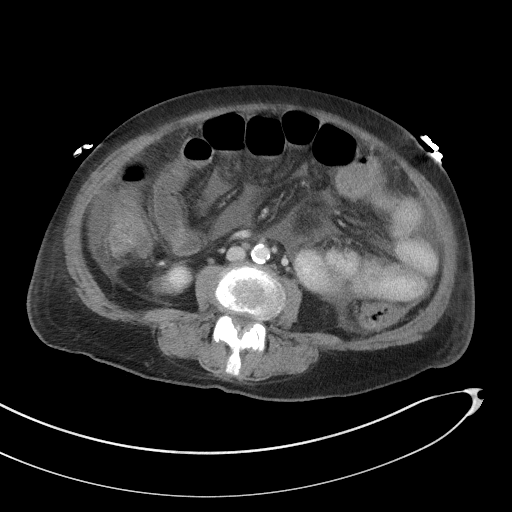
[im 61/97  soft-tissue]
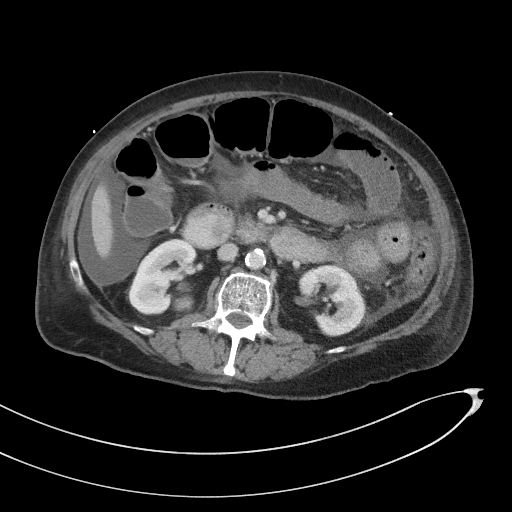
[im 66/97  soft-tissue]
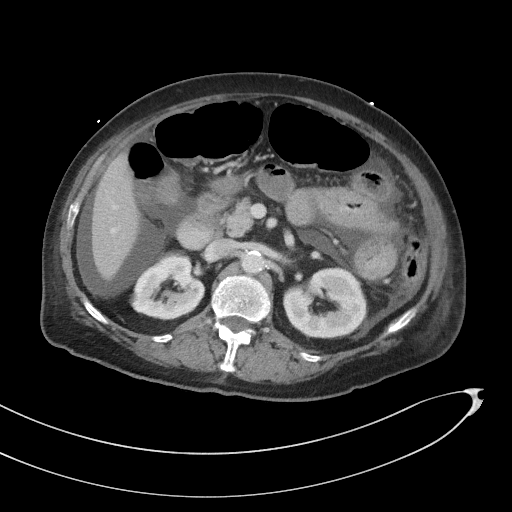
[im 66/97  bone]
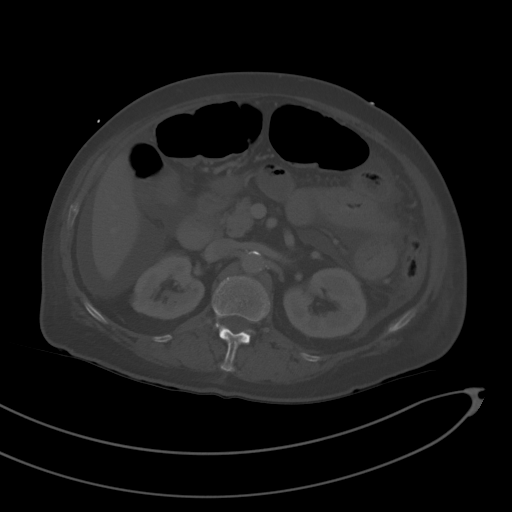
[im 76/97  soft-tissue]
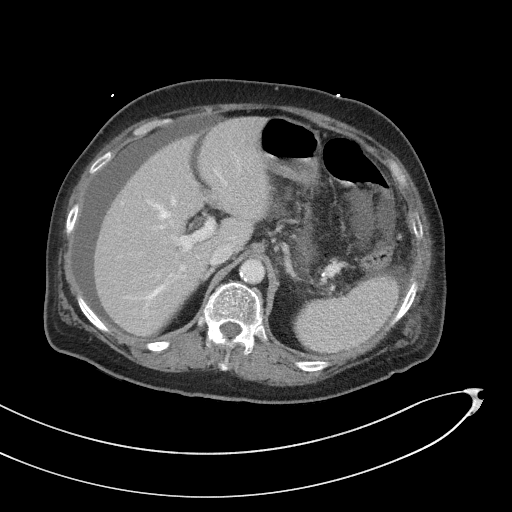
[im 81/97  soft-tissue]
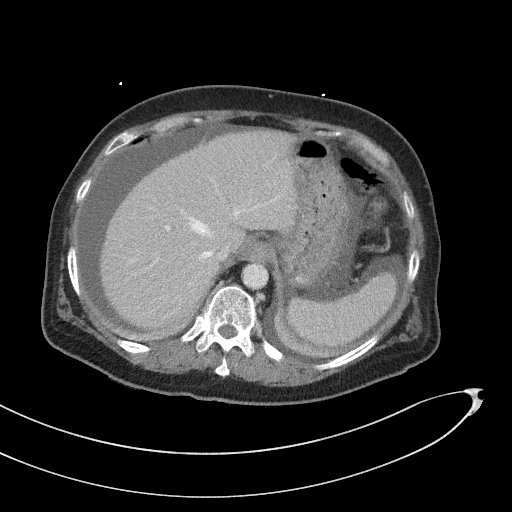
[im 91/97  soft-tissue]
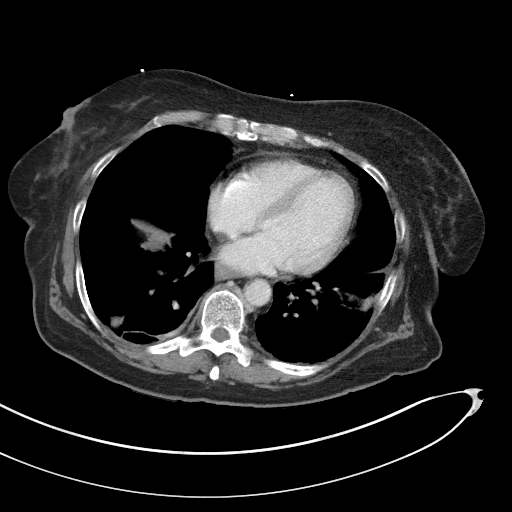

[Series 5: coronal st · coronal · 0.78mm/px · 3 of 106 slices shown]
[im 36/106  soft-tissue]
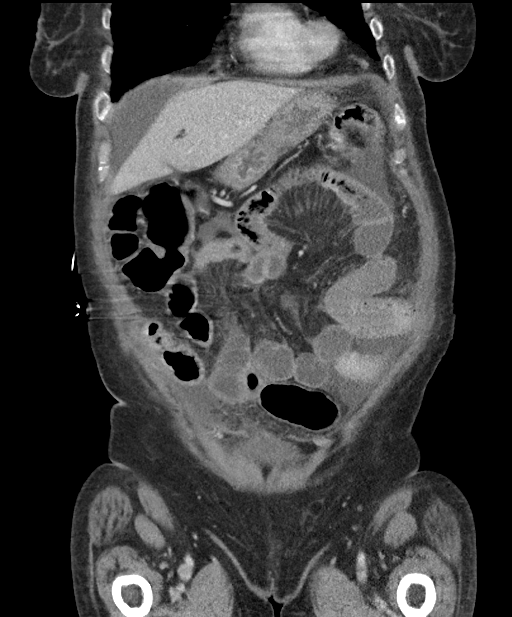
[im 47/106  soft-tissue]
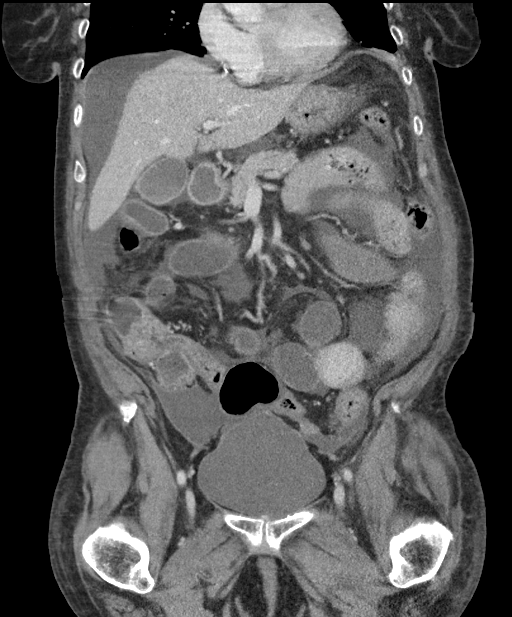
[im 59/106  soft-tissue]
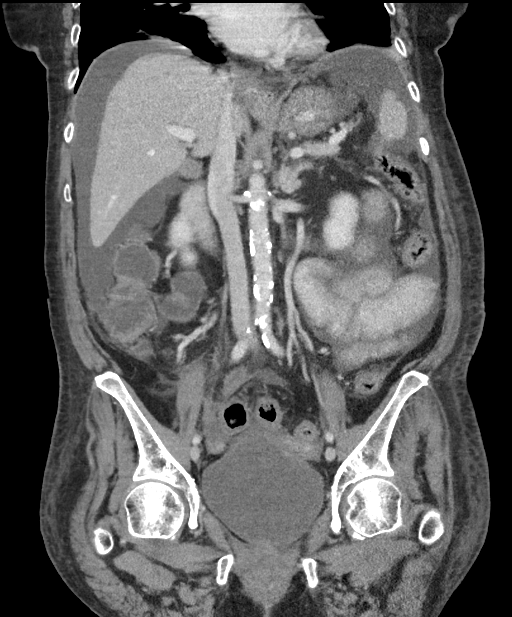

[15 of 46 positions shown; findings below may reference images not displayed]

FINDINGS: Lower chest: There are bibasilar consolidations, increased since
[DATE]. There is a 7 mm nodule in the subpleural right middle
lobe, present since [4J] and likely benign.

Hepatobiliary: The liver and gallbladder are normal. There is no
biliary ductal dilatation.

Pancreas: Unremarkable.

Spleen: Unremarkable.

Adrenals/Urinary Tract: A 1.0 cm right adrenal nodule is unchanged.
Thickening of the left adrenal is unchanged.

Subcentimeter hypodense lesions in the left kidney are too small to
characterize, but likely reflect small cysts. There are no other
focal lesions. There are no stones. There is no hydronephrosis or
hydroureter. The bladder is unremarkable.

Stomach/Bowel: The stomach is unremarkable.

There is no evidence of bowel obstruction. Previously seen small
bowel wall thickening appears improved. There is mild hyperemia of
some loops of small bowel in the lower abdomen. The bowel is
otherwise unremarkable.

Vascular/Lymphatic: There is extensive calcified atherosclerotic
plaque throughout the nonaneurysmal abdominal aorta. The major
branch vessels are patent. The main portal and splenic veins are
patent.

There is a 7 mm portacaval lymph node, nonspecific and unchanged.
There is no new or progressive lymphadenopathy in the abdomen or
pelvis.

Reproductive: A calcified fundal fibroid is unchanged. The uterus
and adnexa are otherwise unremarkable.

Other: There is moderate volume ascites throughout the abdomen and
pelvis, overall similar in volume to the prior study. There is new
mild enhancement along the right anterior peritoneum in the lower
abdomen/pelvis (2-65). There is also mild enhancement along the
posterior margin of the pelvic fluid (2-71). There is no free
intraperitoneal air.

Musculoskeletal: There is marked degenerative change at L4-L5. There
is no aggressive osseous lesion or acute osseous abnormality.
IMPRESSION: 1. Overall similar volume of ascites throughout the abdomen;
however, there is new enhancement of the peritoneal lining in the
lower abdomen/pelvis consistent with peritoneal
irritation/inflammation. No evidence of organized or drainable fluid
collection in the abdomen or pelvis.
2. Decreased small bowel wall thickening suggests improving
enteritis.
3. Bibasilar consolidations likely reflect atelectasis, though
infection can not be entirely excluded.
4. Stable adrenal adenoma and Aortic Atherosclerosis ([4J]-[4J]).

## 2020-11-14 MED ORDER — SACCHAROMYCES BOULARDII 250 MG PO CAPS
250.0000 mg | ORAL_CAPSULE | Freq: Two times a day (BID) | ORAL | Status: DC
  Administered 2020-11-14 – 2020-11-22 (×18): 250 mg via ORAL
  Filled 2020-11-14 (×18): qty 1

## 2020-11-14 MED ORDER — LACTATED RINGERS IV SOLN: INTRAVENOUS | Status: DC

## 2020-11-14 MED ORDER — IOHEXOL 350 MG/ML SOLN
100.0000 mL | Freq: Once | INTRAVENOUS | Status: AC | PRN
  Administered 2020-11-14: 80 mL via INTRAVENOUS

## 2020-11-14 NOTE — Evaluation (Signed)
Occupational Therapy Evaluation Patient Details Name: Cheyenne [name] MRN: [id number] DOB: 02/17/1951 Today's Date: 11/14/2020   History of Present Illness Cheyenne [name] is a 70 y.o. female with medical history significant for type II DM, CVA, hypertension, hyperlipidemia, COPD, tobacco use who presents to the emergency department due to 2 day onset of constant lower abdominal pain.  She was also noted to have some associated nausea and vomiting and was admitted for enterocolitis based on CT findings.  She has been started on IV fluid as well as IV Zosyn.  She was noted to have hyperglycemia secondary to type 2 diabetes which has now resolved.   Clinical Impression   Patient in bed upon therapy arrival and agreeable to participate in OT evaluation. Patient demonstrated decreased orientation of person only. Reports severe pain in right lower abdomen which increases with any movement or mobility. Unable to provide a pain score. Required increased physical assistance with all ADL tasks. IV came out while seated on EOB and message was sent to nurse regarding. Pt demonstrates decreased BUE strength, activity tolerance and endurance requiring increased assistance to complete basic ADL tasks. Unknown regarding history or prior level of function during evaluation. Recommend SNF at discharge to focus on mentioned deficits prior to returning home. OT will follow patient acutely.       Recommendations for follow up therapy are one component of a multi-disciplinary discharge planning process, led by the attending physician.  Recommendations may be updated based on patient status, additional functional criteria and insurance authorization.   Follow Up Recommendations  SNF    Equipment Recommendations  None recommended by OT    Recommendations for Other Services PT consult     Precautions / Restrictions Precautions Precautions: Fall Restrictions Weight Bearing Restrictions: No      Mobility Bed  Mobility Overal bed mobility: Needs Assistance Bed Mobility: Supine to Sit;Sit to Supine     Supine to sit: Max assist;HOB elevated Sit to supine: Max assist   General bed mobility comments: Required increased assistance due to severe pain level. Was unable to stand at EOB. Pt attempted to perform a lateral scoot towards [facility] although unable. required max assist from therapist to complete.    Transfers Overall transfer level:  (Not tested)                    Balance Overall balance assessment: Needs assistance Sitting-balance support: Feet supported;No upper extremity supported Sitting balance-Leahy Scale: Fair Sitting balance - Comments: seated at EOB                                   ADL either performed or assessed with clinical judgement   ADL Overall ADL's : Needs assistance/impaired Eating/Feeding: Minimal assistance;Bed level Eating/Feeding Details (indicate cue type and reason): Required guuidance to bring straw to mouth when attempting to drink water. HOB was raised. Grooming: Sitting;Set up;Wash/dry face   Upper Body Bathing: Maximal assistance;Sitting   Lower Body Bathing: Total assistance;Bed level   Upper Body Dressing : Minimal assistance;Bed level   Lower Body Dressing: Total assistance;Bed level     Toilet Transfer Details (indicate cue type and reason): Unable to complete.                 Vision Baseline Vision/History:  (Unknown) Ability to See in Adequate Light:  (unknown. NT) Patient Visual Report: Other (comment) (unknown) Vision Assessment?: Vision impaired- to be  further tested in functional context Additional Comments: Patient kept eyes almost closed during entire evaluation. Room light was low although she never opened her eyes completely open wide.            Pertinent Vitals/Pain Pain Assessment: Faces Faces Pain Scale: Hurts worst Pain Location: Right lower abdomen. Pain Descriptors / Indicators:  Grimacing;Moaning;Crying;Discomfort;Guarding Pain Intervention(s): Limited activity within patient's tolerance;Monitored during session;Utilized relaxation techniques     Hand Dominance Right   Extremity/Trunk Assessment Upper Extremity Assessment Upper Extremity Assessment: Generalized weakness (Assessed during functional task. Patient demonstrated limited functional use of BUE. Attempted to reach for bed railing with left UE and was unable to raise LUE up to meet new gown at chest level.) RUE Coordination: decreased gross motor;decreased fine motor LUE Coordination: decreased fine motor;decreased gross motor   Lower Extremity Assessment Lower Extremity Assessment: Defer to PT evaluation       Communication Communication Communication: No difficulties   Cognition Arousal/Alertness: Awake/alert Behavior During Therapy: Anxious Overall Cognitive Status: Within Functional Limits for tasks assessed                                 General Comments: Patient only oriented to self. Did not know date, place, or situation.   General Comments               Home Living Family/patient expects to be discharged to:: Private residence Living Arrangements: Other relatives (Granddaughter) Available Help at Discharge: Family;Available 24 hours/day Type of Home: House Home Access: Stairs to enter [employer] of Steps: 4 Entrance Stairs-Rails: Right Home Layout: One level     Bathroom Shower/Tub: Occupational psychologist: Standard Bathroom Accessibility: Yes How Accessible: Accessible via walker Home Equipment: [place] - 2 wheels;Cane - single point;Shower seat      Lives With: Daughter    Prior Functioning/Environment Level of Independence: Needs assistance  Gait / Transfers Assistance Needed: household and short distanced community without AD ADL's / Homemaking Assistance Needed: assisted by family            OT Problem List: Decreased  strength;Decreased range of motion;Decreased activity tolerance;Impaired balance (sitting and/or standing);Decreased cognition;Decreased safety awareness;Decreased knowledge of use of DME or AE;Impaired UE functional use;Decreased coordination;Pain      OT Treatment/Interventions: Self-care/ADL training;Therapeutic exercise;DME and/or AE instruction;Neuromuscular education;Manual therapy;Cognitive remediation/compensation;Patient/family education;Balance training;Therapeutic activities;Modalities    OT Goals(Current goals can be found in the care plan section) Acute Rehab OT Goals Patient Stated Goal: None stated OT Goal Formulation: Patient unable to participate in goal setting Time For Goal Achievement: 11/28/20 Potential to Achieve Goals: Fair  OT Frequency: Min 2X/week    AM-PAC OT "6 Clicks" Daily Activity     Outcome Measure Help from another person eating meals?: A Lot Help from another person taking care of personal grooming?: A Lot Help from another person toileting, which includes using toliet, bedpan, or urinal?: Total Help from another person bathing (including washing, rinsing, drying)?: Total Help from another person to put on and taking off regular upper body clothing?: Total Help from another person to put on and taking off regular lower body clothing?: Total 6 Click Score: 8   End of Session Equipment Utilized During Treatment: Gait belt Nurse Communication: Other (comment) (informed nurse that IV came out during OT evaluation and needed attention)  Activity Tolerance: Patient limited by pain;Patient limited by fatigue Patient left: in bed;with call bell/phone within reach;with bed  alarm set  OT Visit Diagnosis: Muscle weakness (generalized) (M62.81);History of falling (Z91.81)                Time: [id number] OT Time Calculation (min): 38 min Charges:  OT General Charges $OT Visit: 1 Visit OT Evaluation $OT Eval Moderate Complexity: 1 Mod OT Treatments $Self  Care/Home Management : 8-22 mins  Ailene Ravel, OTR/L,CBIS  [phone]   Alvis Pulcini, Clarene Duke 11/14/2020, 10:20 AM

## 2020-11-14 NOTE — Progress Notes (Signed)
PROGRESS NOTE    Cheyenne [name]  [id number] DOB: 01/22/1951 DOA: 11/10/2020 PCP: Toni Arthurs, PA   Brief Narrative:   Cheyenne [name] is a 70 y.o. female with medical history significant for type II DM, CVA, hypertension, hyperlipidemia, COPD, tobacco use who presents to the emergency department due to 2 day onset of constant lower abdominal pain.  She was also noted to have some associated nausea and vomiting and was admitted for enterocolitis based on CT findings.  She has been started on IV fluid as well as IV Zosyn.  She was noted to have hyperglycemia secondary to type 2 diabetes which has now resolved.  She appears to be tolerating more of her soft diet slowly.  Assessment & Plan:   Principal Problem:   Enterocolitis Active Problems:   Essential hypertension   Cerebrovascular accident (CVA) ([place])   Abdominal pain   Nausea & vomiting   AKI (acute kidney injury) ([place])   Hyperglycemia due to diabetes mellitus (HCC)   Leukocytosis   Mixed hyperlipidemia   Tobacco use   Asthma   SIRS (systemic inflammatory response syndrome) (HCC)   Restless leg syndrome   Vitamin D deficiency   Enteritis   Sepsis present on admission secondary to enterocolitis-slowly improving -Patient patient still not tolerating much overnight -Repeat CT abdomen/pelvis with contrast with findings of focal peritoneal irritation, but enteritis appears to be improving -Continue IV Zosyn -Blood cultures with no growth noted thus far -Monitor leukocytosis which is resolved   AKI -Continue some IV fluid hydration as patient is not eating well -Baseline creatinine 0.7-0.9 -Continue IV fluid hydration, likely prerenal -Hold losartan -Monitor strict I's and O's    Hyperglycemia secondary to type 2 diabetes-improved - 10 units Levemir twice daily as ordered and SSI -Okay to transfer to telemetry   Essential hypertension -IV labetalol added for poor BP control -Continue Norvasc -Okay to  resume Cozaar   History of CVA/dyslipidemia -Lipitor -Continue aspirin   Restless leg syndrome -Requip   History of COPD with ongoing tobacco abuse -Counseled on cessation -Albuterol as needed for shortness of breath or wheezing     DVT prophylaxis: Lovenox Code Status: Full Family Communication: Discussed with granddaughter 9/8 Disposition Plan:  Status is: Inpatient   Remains inpatient appropriate because:IV treatments appropriate due to intensity of illness or inability to take PO and Inpatient level of care appropriate due to severity of illness   Dispo: The patient is from: Home              Anticipated d/c is to: Home              Patient currently is not medically stable to d/c.              Difficult to place patient No     Consultants:  None   Procedures:  See below  Antimicrobials:  Anti-infectives (From admission, onward)    Start     Dose/Rate Route Frequency Ordered Stop   11/10/20 1400  piperacillin-tazobactam (ZOSYN) IVPB 3.375 g        3.375 g 12.5 mL/hr over 240 Minutes Intravenous Every 8 hours 11/10/20 0732     11/10/20 1200  piperacillin-tazobactam (ZOSYN) IVPB 3.375 g  Status:  Discontinued        3.375 g 100 mL/hr over 30 Minutes Intravenous Every 6 hours 11/10/20 0654 11/10/20 0732   11/10/20 0615  piperacillin-tazobactam (ZOSYN) IVPB 3.375 g        3.375 g  12.5 mL/hr over 240 Minutes Intravenous  Once 11/10/20 0603 11/10/20 1038      Subjective: Patient seen and evaluated today with ongoing significant abdominal tenderness and poor appetite.  Bowel movements have decreased.  No other acute overnight events.  Objective: Vitals:   11/13/20 2057 11/14/20 0037 11/14/20 0429 11/14/20 1314  BP: (!) 164/71 (!) 164/81 (!) 167/81 117/62  Pulse: (!) 107 (!) 103 (!) 103 (!) 105  Resp: '19 19  20  '$ Temp: 98.1 F (36.7 C) 98.2 F (36.8 C) 97.8 F (36.6 C) (!) 97.5 F (36.4 C)  TempSrc: Oral Oral Oral Oral  SpO2: 90% 90% 93% 94%  Weight:       Height:        Intake/Output Summary (Last 24 hours) at 11/14/2020 1519 Last data filed at 11/14/2020 0900 Gross per 24 hour  Intake 120 ml  Output 1 ml  Net 119 ml   Filed Weights   11/10/20 0356  Weight: 63.5 kg    Examination:  General exam: Appears calm and comfortable  Respiratory system: Clear to auscultation. Respiratory effort normal. Cardiovascular system: S1 & S2 heard, RRR.  Gastrointestinal system: Abdomen is tender to palpation. Central nervous system: Alert and awake Extremities: No edema Skin: No significant lesions noted Psychiatry: Flat affect.    Data Reviewed: I have personally reviewed following labs and imaging studies  CBC: Recent Labs  Lab 11/10/20 1048 11/11/20 0510 11/12/20 0322 11/13/20 0401 11/14/20 0551  WBC 16.0* 15.4* 10.9* 6.7 8.1  HGB 14.1 12.4 12.8 11.0* 13.3  HCT 43.3 38.5 39.7 35.0* 40.4  MCV 89.1 90.0 89.8 90.2 87.6  PLT 404* 295 270 283 [id number]   Basic Metabolic Panel: Recent Labs  Lab 11/10/20 1048 11/11/20 0510 11/12/20 0322 11/13/20 0401 11/14/20 0551  NA 133* 142 142 144 141  K 4.2 4.1 3.3* 3.3* 4.0  CL 103 112* 115* 116* 109  CO2 16* 22 23 21* 18*  GLUCOSE 579* 155* 57* 66* 108*  BUN 38* 27* '21 20 18  '$ CREATININE 3.11* 1.17* 0.89 0.77 1.19*  CALCIUM 9.6 9.2 8.5* 8.2* 8.6*  MG 2.0 1.9 2.2 2.2 2.0  PHOS  --  3.4  --   --   --    GFR: Estimated Creatinine Clearance: 38.5 mL/min (A) (by C-G formula based on SCr of 1.19 mg/dL (H)). Liver Function Tests: Recent Labs  Lab 11/09/20 1720 11/11/20 0510  AST 15 13*  ALT 21 15  ALKPHOS 79 69  BILITOT 0.5 0.5  PROT 7.2 6.5  ALBUMIN 3.9 3.0*   Recent Labs  Lab 11/09/20 1720  LIPASE 20   No results for input(s): AMMONIA in the last 168 hours. Coagulation Profile: Recent Labs  Lab 11/11/20 0510  INR 1.3*   Cardiac Enzymes: No results for input(s): CKTOTAL, CKMB, CKMBINDEX, TROPONINI in the last 168 hours. BNP (last 3 results) No results for input(s):  PROBNP in the last 8760 hours. HbA1C: No results for input(s): HGBA1C in the last 72 hours. CBG: Recent Labs  Lab 11/13/20 2046 11/14/20 0042 11/14/20 0450 11/14/20 0721 11/14/20 1106  GLUCAP 144* 125* 109* 129* 151*   Lipid Profile: No results for input(s): CHOL, HDL, LDLCALC, TRIG, CHOLHDL, LDLDIRECT in the last 72 hours. Thyroid Function Tests: No results for input(s): TSH, T4TOTAL, FREET4, T3FREE, THYROIDAB in the last 72 hours. Anemia Panel: No results for input(s): VITAMINB12, FOLATE, FERRITIN, TIBC, IRON, RETICCTPCT in the last 72 hours. Sepsis Labs: No results for input(s): PROCALCITON, LATICACIDVEN in the last  168 hours.  Recent Results (from the past 240 hour(s))  Resp Panel by RT-PCR (Flu A&B, Covid) Nasopharyngeal Swab     Status: None   Collection Time: 11/10/20  6:38 AM   Specimen: Nasopharyngeal Swab; Nasopharyngeal(NP) swabs in vial transport medium  Result Value Ref Range Status   SARS Coronavirus 2 by RT PCR NEGATIVE NEGATIVE Final    Comment: (NOTE) SARS-CoV-2 target nucleic acids are NOT DETECTED.  The SARS-CoV-2 RNA is generally detectable in upper respiratory specimens during the acute phase of infection. The lowest concentration of SARS-CoV-2 viral copies this assay can detect is 138 copies/mL. A negative result does not preclude SARS-Cov-2 infection and should not be used as the sole basis for treatment or other patient management decisions. A negative result may occur with  improper specimen collection/handling, submission of specimen other than nasopharyngeal swab, presence of viral mutation(s) within the areas targeted by this assay, and inadequate number of viral copies(<138 copies/mL). A negative result must be combined with clinical observations, patient history, and epidemiological information. The expected result is Negative.  Fact Sheet for Patients:  EntrepreneurPulse.com.au  Fact Sheet for Healthcare Providers:   IncredibleEmployment.be  This test is no t yet approved or cleared by the [place] FDA and  has been authorized for detection and/or diagnosis of SARS-CoV-2 by FDA under an Emergency Use Authorization (EUA). This EUA will remain  in effect (meaning this test can be used) for the duration of the COVID-19 declaration under Section 564(b)(1) of the Act, 21 U.S.C.section 360bbb-3(b)(1), unless the authorization is terminated  or revoked sooner.       Influenza A by PCR NEGATIVE NEGATIVE Final   Influenza B by PCR NEGATIVE NEGATIVE Final    Comment: (NOTE) The Xpert Xpress SARS-CoV-2/FLU/RSV plus assay is intended as an aid in the diagnosis of influenza from Nasopharyngeal swab specimens and should not be used as a sole basis for treatment. Nasal washings and aspirates are unacceptable for Xpert Xpress SARS-CoV-2/FLU/RSV testing.  Fact Sheet for Patients: EntrepreneurPulse.com.au  Fact Sheet for Healthcare Providers: IncredibleEmployment.be  This test is not yet approved or cleared by the [place] FDA and has been authorized for detection and/or diagnosis of SARS-CoV-2 by FDA under an Emergency Use Authorization (EUA). This EUA will remain in effect (meaning this test can be used) for the duration of the COVID-19 declaration under Section 564(b)(1) of the Act, 21 U.S.C. section 360bbb-3(b)(1), unless the authorization is terminated or revoked.  Performed at [facility], [address], [place], [place] [address]   Blood culture (routine x 2)     Status: None (Preliminary result)   Collection Time: 11/10/20  7:18 AM   Specimen: Right Antecubital; Blood  Result Value Ref Range Status   Specimen Description RIGHT ANTECUBITAL  Final   Special Requests   Final    BOTTLES DRAWN AEROBIC AND ANAEROBIC Blood Culture adequate volume   Culture   Final    NO GROWTH 4 DAYS Performed at [facility], [address],  [place], [place] [address]    Report Status PENDING  Incomplete  Blood culture (routine x 2)     Status: None (Preliminary result)   Collection Time: 11/10/20  7:19 AM   Specimen: BLOOD RIGHT WRIST  Result Value Ref Range Status   Specimen Description BLOOD RIGHT WRIST  Final   Special Requests   Final    Blood Culture adequate volume BOTTLES DRAWN AEROBIC AND ANAEROBIC   Culture   Final    NO  GROWTH 4 DAYS Performed at [facility], [address] [place], [place] [address]    Report Status PENDING  Incomplete  MRSA Next Gen by PCR, Nasal     Status: None   Collection Time: 11/10/20  2:42 PM   Specimen: Nasal Mucosa; Nasal Swab  Result Value Ref Range Status   MRSA by PCR Next Gen NOT DETECTED NOT DETECTED Final    Comment: (NOTE) The GeneXpert MRSA Assay (FDA approved for NASAL specimens only), is one component of a comprehensive MRSA colonization surveillance program. It is not intended to diagnose MRSA infection nor to guide or monitor treatment for MRSA infections. Test performance is not FDA approved in patients less than 70 years old. Performed at [facility], [address], [place], [place] [address]   C Difficile Quick Screen (NO PCR Reflex)     Status: None   Collection Time: 11/13/20  3:00 PM   Specimen: Stool  Result Value Ref Range Status   C Diff antigen NEGATIVE NEGATIVE Final   C Diff toxin NEGATIVE NEGATIVE Final   C Diff interpretation No C. difficile detected.  Final    Comment: Performed at [facility], [address], [place], [place] [address]         Radiology Studies: CT ABDOMEN PELVIS W CONTRAST  Result Date: 11/14/2020 CLINICAL DATA:  Worsening abdominal pain with nausea, vomiting EXAM: CT ABDOMEN AND PELVIS WITH CONTRAST TECHNIQUE: Multidetector CT imaging of the abdomen and pelvis was performed using the standard protocol following bolus administration of intravenous contrast. CONTRAST:  1m OMNIPAQUE IOHEXOL 350 MG/ML SOLN COMPARISON:  CT  abdomen/pelvis 11/10/2020, 11/27/2017 FINDINGS: Lower chest: There are bibasilar consolidations, increased since 11/10/2020. There is a 7 mm nodule in the subpleural right middle lobe, present since 2016 and likely benign. Hepatobiliary: The liver and gallbladder are normal. There is no biliary ductal dilatation. Pancreas: Unremarkable. Spleen: Unremarkable. Adrenals/Urinary Tract: A 1.0 cm right adrenal nodule is unchanged. Thickening of the left adrenal is unchanged. Subcentimeter hypodense lesions in the left kidney are too small to characterize, but likely reflect small cysts. There are no other focal lesions. There are no stones. There is no hydronephrosis or hydroureter. The bladder is unremarkable. Stomach/Bowel: The stomach is unremarkable. There is no evidence of bowel obstruction. Previously seen small bowel wall thickening appears improved. There is mild hyperemia of some loops of small bowel in the lower abdomen. The bowel is otherwise unremarkable. Vascular/Lymphatic: There is extensive calcified atherosclerotic plaque throughout the nonaneurysmal abdominal aorta. The major branch vessels are patent. The main portal and splenic veins are patent. There is a 7 mm portacaval lymph node, nonspecific and unchanged. There is no new or progressive lymphadenopathy in the abdomen or pelvis. Reproductive: A calcified fundal fibroid is unchanged. The uterus and adnexa are otherwise unremarkable. Other: There is moderate volume ascites throughout the abdomen and pelvis, overall similar in volume to the prior study. There is new mild enhancement along the right anterior peritoneum in the lower abdomen/pelvis (2-65). There is also mild enhancement along the posterior margin of the pelvic fluid (2-71). There is no free intraperitoneal air. Musculoskeletal: There is marked degenerative change at L4-L5. There is no aggressive osseous lesion or acute osseous abnormality. IMPRESSION: 1. Overall similar volume of ascites  throughout the abdomen; however, there is new enhancement of the peritoneal lining in the lower abdomen/pelvis consistent with peritoneal irritation/inflammation. No evidence of organized or drainable fluid collection in the abdomen or pelvis. 2. Decreased small bowel wall thickening  suggests improving enteritis. 3. Bibasilar consolidations likely reflect atelectasis, though infection can not be entirely excluded. 4. Stable adrenal adenoma and Aortic Atherosclerosis (ICD10-I70.0). Electronically Signed   By: Valetta Mole M.D.   On: 11/14/2020 14:15        Scheduled Meds:  amLODipine  10 mg Oral Daily   aspirin EC  81 mg Oral Daily   atorvastatin  80 mg Oral Daily   Chlorhexidine Gluconate Cloth  6 each Topical Daily   chlorthalidone  50 mg Oral q morning   cholecalciferol  1,000 Units Oral Daily   enoxaparin (LOVENOX) injection  40 mg Subcutaneous Q24H   escitalopram  10 mg Oral Daily   insulin aspart  0-20 Units Subcutaneous Q4H   insulin detemir  10 Units Subcutaneous BID   losartan  50 mg Oral BID   rOPINIRole  0.25 mg Oral QHS   saccharomyces boulardii  250 mg Oral BID   Continuous Infusions:  piperacillin-tazobactam (ZOSYN)  IV 3.375 g (11/14/20 0536)     LOS: 4 days    Time spent: 35 minutes    Dainelle Hun Darleen Crocker, DO [facility]  If 7PM-7AM, please contact night-coverage [email or web address] 11/14/2020, 3:19 PM

## 2020-11-14 NOTE — Evaluation (Signed)
Physical Therapy Evaluation Patient Details Name: Cheyenne [name] MRN: [id number] DOB: 1950/07/10 Today's Date: 11/14/2020  History of Present Illness  Cheyenne [name] is a 70 y.o. female with medical history significant for type II DM, CVA, hypertension, hyperlipidemia, COPD, tobacco use who presents to the emergency department due to 2 day onset of constant lower abdominal pain.  She was also noted to have some associated nausea and vomiting and was admitted for enterocolitis based on CT findings.  She has been started on IV fluid as well as IV Zosyn.  She was noted to have hyperglycemia secondary to type 2 diabetes which has now resolved.   Clinical Impression  Patient demonstrates slow labored movement for sitting up at bedside requiring HOB raised, use of bed rail and Mod assist to pull self up, unable to stand without AD due to BLE weakness, had to use RW and limited to a few very slow labored steps at bedside before having to sit due to fatigue.  Patient tolerated sitting up in chair after therapy - nursing staff notified.  Patient will benefit from continued physical therapy in hospital and recommended venue below to increase strength, balance, endurance for safe ADLs and gait.          Recommendations for follow up therapy are one component of a multi-disciplinary discharge planning process, led by the attending physician.  Recommendations may be updated based on patient status, additional functional criteria and insurance authorization.  Follow Up Recommendations SNF;Supervision for mobility/OOB;Supervision - Intermittent    Equipment Recommendations  None recommended by PT    Recommendations for Other Services       Precautions / Restrictions Precautions Precautions: Fall Restrictions Weight Bearing Restrictions: No      Mobility  Bed Mobility Overal bed mobility: Needs Assistance Bed Mobility: Supine to Sit     Supine to sit: Mod assist;HOB elevated     General  bed mobility comments: slow labored movement requiring use of bed rail and Mod assist    Transfers Overall transfer level: Needs assistance Equipment used: Rolling walker (2 wheeled) Transfers: Sit to/from [facility] Sit to Stand: Min assist;Mod assist Stand pivot transfers: Min assist;Mod assist       General transfer comment: very slow labored movement with frequent c/o of stomach pain  Ambulation/Gait Ambulation/Gait assistance: Mod assist Gait Distance (Feet): 5 Feet Assistive device: Rolling walker (2 wheeled) Gait Pattern/deviations: Decreased step length - right;Decreased step length - left;Decreased stride length Gait velocity: decreased   General Gait Details: limited to a few slow labored steps at bedsid before having to sit due to fatigue  Stairs            Wheelchair Mobility    Modified Rankin (Stroke Patients Only)       Balance Overall balance assessment: Needs assistance Sitting-balance support: Feet supported;No upper extremity supported Sitting balance-Leahy Scale: Fair Sitting balance - Comments: seated at EOB   Standing balance support: During functional activity;No upper extremity supported Standing balance-Leahy Scale: Poor Standing balance comment: fair/poor using RW                             Pertinent Vitals/Pain Pain Assessment: Faces Faces Pain Scale: Hurts even more Pain Location: Right lower abdomen. Pain Descriptors / Indicators: Grimacing;Moaning;Crying;Discomfort;Guarding Pain Intervention(s): Limited activity within patient's tolerance;Monitored during session;Repositioned    Home Living Family/patient expects to be discharged to:: Private residence Living Arrangements: Other relatives Available Help at  Discharge: Family;Available 24 hours/day Type of Home: House Home Access: Stairs to enter Entrance Stairs-Rails: Right Entrance Stairs-Number of Steps: 4 Home Layout: One level Home  Equipment: Walker - 2 wheels;Cane - single point;Shower seat      Prior Function Level of Independence: Needs assistance   Gait / Transfers Assistance Needed: household and short distanced community without AD  ADL's / Homemaking Assistance Needed: assisted by family        Hand Dominance   Dominant Hand: Right    Extremity/Trunk Assessment   Upper Extremity Assessment Upper Extremity Assessment: Defer to OT evaluation    Lower Extremity Assessment Lower Extremity Assessment: Generalized weakness       Communication   Communication: No difficulties  Cognition Arousal/Alertness: Awake/alert Behavior During Therapy: WFL for tasks assessed/performed Overall Cognitive Status: Within Functional Limits for tasks assessed                                        General Comments      Exercises     Assessment/Plan    PT Assessment Patient needs continued PT services  PT Problem List Decreased strength;Decreased activity tolerance;Decreased balance;Decreased mobility       PT Treatment Interventions Gait training;Stair training;Functional mobility training;Therapeutic activities;Therapeutic exercise;Patient/family education;DME instruction;Balance training    PT Goals (Current goals can be found in the Care Plan section)  Acute Rehab PT Goals Patient Stated Goal: return home with family to assist PT Goal Formulation: With patient Time For Goal Achievement: 11/28/20 Potential to Achieve Goals: Good    Frequency Min 3X/week   Barriers to discharge        Co-evaluation               AM-PAC PT "6 Clicks" Mobility  Outcome Measure Help needed turning from your back to your side while in a flat bed without using bedrails?: A Little Help needed moving from lying on your back to sitting on the side of a flat bed without using bedrails?: A Lot Help needed moving to and from a bed to a chair (including a wheelchair)?: A Lot Help needed standing  up from a chair using your arms (e.g., wheelchair or bedside chair)?: A Lot Help needed to walk in hospital room?: A Lot Help needed climbing 3-5 steps with a railing? : A Lot 6 Click Score: 13    End of Session   Activity Tolerance: Patient tolerated treatment well;Patient limited by fatigue Patient left: in chair;with call bell/phone within reach;with chair alarm set Nurse Communication: Mobility status PT Visit Diagnosis: Unsteadiness on feet (R26.81);Other abnormalities of gait and mobility (R26.89);Muscle weakness (generalized) (M62.81)    Time: [id number] PT Time Calculation (min) (ACUTE ONLY): 22 min   Charges:   PT Evaluation $PT Eval Moderate Complexity: 1 Mod PT Treatments $Therapeutic Activity: 8-22 mins        4:01 PM, 11/14/20 Lonell Grandchild, MPT Physical Therapist with [facility] [phone] office [phone] mobile phone

## 2020-11-14 NOTE — Plan of Care (Signed)
  Problem: Acute Rehab PT Goals(only PT should resolve) Goal: Pt Will Go Supine/Side To Sit Outcome: Progressing Flowsheets (Taken 11/14/2020 1602) Pt will go Supine/Side to Sit: with min guard assist Goal: Patient Will Transfer Sit To/From Stand Outcome: Progressing Flowsheets (Taken 11/14/2020 1602) Patient will transfer sit to/from stand: with min guard assist Goal: Pt Will Transfer Bed To Chair/Chair To Bed Outcome: Progressing Flowsheets (Taken 11/14/2020 1602) Pt will Transfer Bed to Chair/Chair to Bed: min guard assist Goal: Pt Will Ambulate Outcome: Progressing Flowsheets (Taken 11/14/2020 1602) Pt will Ambulate:  50 feet  with minimal assist  with rolling walker   4:02 PM, 11/14/20 Lonell Grandchild, MPT Physical Therapist with [facility] [phone] office [phone] mobile phone

## 2020-11-14 NOTE — TOC Initial Note (Signed)
Transition of Care [facility]) - Initial/Assessment Note    Patient Details  Name: Cheyenne [name] MRN: [id number] Date of Birth: Mar 18, 1950  Transition of Care [facility]) CM/SW Contact:    Boneta Lucks, RN Phone Number: 11/14/2020, 4:33 PM  Clinical Narrative:       PT is recommending SNF.  TOC spoke with Anderson Malta, granddaughter. Patient was discharged from [facility] on Tuesday of last week after 5 days of rehab.  She was doing well, left there and walked around shopping for 3 hours. Anderson Malta is agreeable to her going back to SNF. She ask for something closer. Reviewed rating, She requested to sent out to [facility] and [facility]. She is find with her going back to [facility].  Patient has had one COVID vaccine, can not have another one for 3 months.            Expected Discharge Plan: Skilled Nursing Facility Barriers to Discharge: Continued Medical Work up  Patient Goals and CMS Choice Patient states their goals for this hospitalization and ongoing recovery are:: agreeable to SNF. CMS Medicare.gov Compare Post Acute Care list provided to:: Patient Represenative (must comment) Choice offered to / list presented to : Adult Children  Expected Discharge Plan and Services Expected Discharge Plan: [place]     Prior Living Arrangements/Services     Activities of Daily Living Home Assistive Devices/Equipment: Environmental consultant (specify type), CBG Meter ADL Screening (condition at time of admission) Patient's cognitive ability adequate to safely complete daily activities?: Yes Is the patient deaf or have difficulty hearing?: No Does the patient have difficulty seeing, even when wearing glasses/contacts?: No Does the patient have difficulty concentrating, remembering, or making decisions?: No Patient able to express need for assistance with ADLs?: Yes Does the patient have difficulty dressing or bathing?: No Independently performs ADLs?: Yes (appropriate for developmental age) Does the patient  have difficulty walking or climbing stairs?: No Weakness of Legs: Both Weakness of Arms/Hands: None  Permission Sought/Granted     Emotional Assessment     Admission diagnosis:  Enterocolitis [K52.9] Enteritis [K52.9] Abdominal pain, unspecified abdominal location [R10.9] Patient Active Problem List   Diagnosis Date Noted   Enterocolitis 11/10/2020   Abdominal pain 11/10/2020   Nausea & vomiting 11/10/2020   AKI (acute kidney injury) ([place]) 11/10/2020   Hyperglycemia due to diabetes mellitus ([place]) 11/10/2020   Leukocytosis 11/10/2020   Mixed hyperlipidemia 11/10/2020   Tobacco use 11/10/2020   Asthma 11/10/2020   SIRS (systemic inflammatory response syndrome) ([place]) 11/10/2020   Restless leg syndrome 11/10/2020   Vitamin D deficiency 11/10/2020   Enteritis 11/10/2020   Cerebrovascular accident (CVA) ([place])    Generalized weakness 10/25/2020   Essential hypertension 10/25/2020   Type 2 diabetes mellitus with hyperlipidemia ([place]) 10/25/2020   Psychiatric problem 10/25/2020   PCP:  Toni Arthurs, PA Pharmacy:   [place] [place] [address] [place] [address] Phone: [phone] Fax: [place], [place] [facility] [address] [place] [facility] [place] [address] Phone: [phone] Fax: [phone]   Readmission Risk Interventions Readmission Risk Prevention Plan 11/14/2020  Transportation Screening Complete  Home Care Screening Complete  Medication Review (RN CM) Complete  Some recent data might be hidden

## 2020-11-14 NOTE — Plan of Care (Signed)
  Problem: Acute Rehab OT Goals (only OT should resolve) Goal: Pt. Will Perform Eating Flowsheets (Taken 11/14/2020 1024) Pt Will Perform Eating:  with modified independence  sitting Goal: Pt. Will Perform Grooming Flowsheets (Taken 11/14/2020 1024) Pt Will Perform Grooming:  with supervision  standing Goal: Pt. Will Perform Upper Body Bathing Flowsheets (Taken 11/14/2020 1024) Pt Will Perform Upper Body Bathing:  with supervision  sitting Goal: Pt. Will Perform Lower Body Bathing Flowsheets (Taken 11/14/2020 1024) Pt Will Perform Lower Body Bathing:  with mod assist  sitting/lateral leans  sit to/from stand Goal: Pt. Will Perform Upper Body Dressing Flowsheets (Taken 11/14/2020 1024) Pt Will Perform Upper Body Dressing:  with supervision  sitting Goal: Pt. Will Transfer To Toilet Flowsheets (Taken 11/14/2020 1024) Pt Will Transfer to Toilet:  with min assist  ambulating  bedside commode Goal: Pt. Will Perform Toileting-Clothing Manipulation Flowsheets (Taken 11/14/2020 1024) Pt Will Perform Toileting - Clothing Manipulation and hygiene:  with min assist  sitting/lateral leans  sit to/from stand Goal: Pt/Caregiver Will Perform Home Exercise Program Flowsheets (Taken 11/14/2020 1024) Pt/caregiver will Perform Home Exercise Program:  Increased strength  Both right and left upper extremity  With Supervision  With written HEP provided

## 2020-11-14 NOTE — NC FL2 (Signed)
[place] MEDICAID FL2 LEVEL OF CARE SCREENING TOOL     IDENTIFICATION  Patient Name: Cheyenne [name] Birthdate: Mar 02, 1951 Sex: female Admission Date (Current Location): 11/10/2020  [facility] [place] Number:  [employer] and Address:  [place] [address] [place]      Provider Number: [id number]  Attending Physician Name and Address:  Rodena Goldmann, DO  Relative Name and Phone Number:  Lanna Poche - Granddaughter - [phone]    Current Level of Care: Hospital Recommended Level of Care: [place] Prior Approval Number:    Date Approved/Denied:   PASRR Number: [id number]  Discharge Plan: SNF    Current Diagnoses: Patient Active Problem List   Diagnosis Date Noted   Enterocolitis 11/10/2020   Abdominal pain 11/10/2020   Nausea & vomiting 11/10/2020   AKI (acute kidney injury) ([place]) 11/10/2020   Hyperglycemia due to diabetes mellitus ([place]) 11/10/2020   Leukocytosis 11/10/2020   Mixed hyperlipidemia 11/10/2020   Tobacco use 11/10/2020   Asthma 11/10/2020   SIRS (systemic inflammatory response syndrome) ([place]) 11/10/2020   Restless leg syndrome 11/10/2020   Vitamin D deficiency 11/10/2020   Enteritis 11/10/2020   Cerebrovascular accident (CVA) ([place])    Generalized weakness 10/25/2020   Essential hypertension 10/25/2020   Type 2 diabetes mellitus with hyperlipidemia ([place]) 10/25/2020   Psychiatric problem 10/25/2020    Orientation RESPIRATION BLADDER Height & Weight     Self, Time, Situation, Place  Normal Continent Weight: 63.5 kg Height:  '5\' 4"'$  (162.6 cm)  BEHAVIORAL SYMPTOMS/MOOD NEUROLOGICAL BOWEL NUTRITION STATUS      Continent Diet (See DC summary)  AMBULATORY STATUS COMMUNICATION OF NEEDS Skin   Extensive Assist Verbally Normal                       Personal Care Assistance Level of Assistance  Bathing, Feeding, Dressing Bathing Assistance: Maximum assistance Feeding assistance:  Independent Dressing Assistance: Maximum assistance     Functional Limitations Info  Sight, Hearing, Speech Sight Info: Adequate Hearing Info: Adequate Speech Info: Adequate    SPECIAL CARE FACTORS FREQUENCY  PT (By licensed PT)     PT Frequency: 5 times a week              Contractures Contractures Info: Not present    Additional Factors Info  Code Status, Allergies Code Status Info: FULL Allergies Info: Gabapentin, Pregabalin, Codeine   Insulin Sliding Scale Info: CBG < 70: Implement Hypoglycemia Standing Orders and refer to Hypoglycemia Standing Orders sidebar report   CBG 70 - 120: 0 units   CBG 121 - 150: 3 units   CBG 151 - 200: 4 units   CBG 201 - 250: 7 units   CBG 251 - 300: 11 units   CBG 301 - 350: 15 units   CBG 351 - 400: 20 units   CBG > 400 call MD and obtain STAT lab verification       Current Medications (11/14/2020):  This is the current hospital active medication list Current Facility-Administered Medications  Medication Dose Route Frequency Provider Last Rate Last Admin   albuterol (PROVENTIL) (2.5 MG/3ML) 0.083% nebulizer solution 2.5 mg  2.5 mg Nebulization Q6H PRN Manuella Ghazi, Pratik D, DO       amLODipine (NORVASC) tablet 10 mg  10 mg Oral Daily Adefeso, Oladapo, DO   10 mg at 11/14/20 0956   aspirin EC tablet 81 mg  81 mg Oral Daily Adefeso, Oladapo,  DO   81 mg at 11/14/20 0956   atorvastatin (LIPITOR) tablet 80 mg  80 mg Oral Daily Adefeso, Oladapo, DO   80 mg at 11/14/20 0955   Chlorhexidine Gluconate Cloth 2 % PADS 6 each  6 each Topical Daily Heath Lark D, DO   6 each at 11/12/20 0847   chlorthalidone (HYGROTON) tablet 50 mg  50 mg Oral q morning Heath Lark D, DO   50 mg at 11/14/20 0956   cholecalciferol (VITAMIN D3) tablet 1,000 Units  1,000 Units Oral Daily Adefeso, Oladapo, DO   1,000 Units at 11/14/20 1100   dextrose 50 % solution 0-50 mL  0-50 mL Intravenous PRN Manuella Ghazi, Pratik D, DO   50 mL at 11/13/20 0445   enoxaparin (LOVENOX) injection 40 mg   40 mg Subcutaneous Q24H Adefeso, Oladapo, DO   40 mg at 11/14/20 0957   escitalopram (LEXAPRO) tablet 10 mg  10 mg Oral Daily Manuella Ghazi, Pratik D, DO   10 mg at 11/14/20 [id number]   haloperidol lactate (HALDOL) injection 2 mg  2 mg Intravenous Q6H PRN Manuella Ghazi, Pratik D, DO       HYDROmorphone (DILAUDID) injection 0.5 mg  0.5 mg Intravenous Q3H PRN Manuella Ghazi, Pratik D, DO   0.5 mg at 11/14/20 1514   insulin aspart (novoLOG) injection 0-20 Units  0-20 Units Subcutaneous Q4H Shah, Pratik D, DO   3 Units at 11/14/20 1227   insulin detemir (LEVEMIR) injection 10 Units  10 Units Subcutaneous BID Heath Lark D, DO   10 Units at 11/14/20 0955   labetalol (NORMODYNE) injection 10 mg  10 mg Intravenous Q2H PRN Heath Lark D, DO   10 mg at 11/13/20 0118   lactated ringers infusion   Intravenous Continuous Manuella Ghazi, Pratik D, DO       losartan (COZAAR) tablet 50 mg  50 mg Oral BID Manuella Ghazi, Pratik D, DO   50 mg at 11/14/20 0955   ondansetron (ZOFRAN) injection 4 mg  4 mg Intravenous Q6H PRN Adefeso, Oladapo, DO       piperacillin-tazobactam (ZOSYN) IVPB 3.375 g  3.375 g Intravenous Q8H Shah, Pratik D, DO 12.5 mL/hr at 11/14/20 0536 3.375 g at 11/14/20 0536   rOPINIRole (REQUIP) tablet 0.25 mg  0.25 mg Oral QHS Adefeso, Oladapo, DO   0.25 mg at 11/13/20 2215   saccharomyces boulardii (FLORASTOR) capsule 250 mg  250 mg Oral BID Heath Lark D, DO   250 mg at 11/14/20 1100     Discharge Medications: Please see discharge summary for a list of discharge medications.  Relevant Imaging Results:  Relevant Lab Results:   Additional Information SS# [id number]  Boneta Lucks, RN

## 2020-11-15 DIAGNOSIS — K529 Noninfective gastroenteritis and colitis, unspecified: Secondary | ICD-10-CM | POA: Diagnosis not present

## 2020-11-15 LAB — BASIC METABOLIC PANEL
Anion gap: 10 (ref 5–15)
BUN: 29 mg/dL — ABNORMAL HIGH (ref 8–23)
CO2: 21 mmol/L — ABNORMAL LOW (ref 22–32)
Calcium: 8.8 mg/dL — ABNORMAL LOW (ref 8.9–10.3)
Chloride: 113 mmol/L — ABNORMAL HIGH (ref 98–111)
Creatinine, Ser: 1.59 mg/dL — ABNORMAL HIGH (ref 0.44–1.00)
GFR, Estimated: 35 mL/min — ABNORMAL LOW (ref >60)
Glucose, Bld: 77 mg/dL (ref 70–99)
Potassium: 2.9 mmol/L — ABNORMAL LOW (ref 3.5–5.1)
Sodium: 144 mmol/L (ref 135–145)

## 2020-11-15 LAB — CBC
HCT: 38.3 % (ref 36.0–46.0)
Hemoglobin: 12.5 g/dL (ref 12.0–15.0)
MCH: 28.6 pg (ref 26.0–34.0)
MCHC: 32.6 g/dL (ref 30.0–36.0)
MCV: 87.6 fL (ref 80.0–100.0)
Platelets: 358 10*3/uL (ref 150–400)
RBC: 4.37 MIL/uL (ref 3.87–5.11)
RDW: 14.4 % (ref 11.5–15.5)
WBC: 10.6 10*3/uL — ABNORMAL HIGH (ref 4.0–10.5)
nRBC: 0 % (ref 0.0–0.2)

## 2020-11-15 LAB — GLUCOSE, CAPILLARY
Glucose-Capillary: 100 mg/dL — ABNORMAL HIGH (ref 70–99)
Glucose-Capillary: 60 mg/dL — ABNORMAL LOW (ref 70–99)
Glucose-Capillary: 64 mg/dL — ABNORMAL LOW (ref 70–99)
Glucose-Capillary: 133 mg/dL — ABNORMAL HIGH (ref 70–99)
Glucose-Capillary: 151 mg/dL — ABNORMAL HIGH (ref 70–99)
Glucose-Capillary: 188 mg/dL — ABNORMAL HIGH (ref 70–99)

## 2020-11-15 LAB — CULTURE, BLOOD (ROUTINE X 2)
Culture: NO GROWTH
Culture: NO GROWTH
Special Requests: ADEQUATE
Special Requests: ADEQUATE

## 2020-11-15 MED ORDER — POTASSIUM CHLORIDE 10 MEQ/100ML IV SOLN
10.0000 meq | INTRAVENOUS | Status: AC
  Administered 2020-11-15 (×4): 10 meq via INTRAVENOUS
  Filled 2020-11-15 (×4): qty 100

## 2020-11-15 MED ORDER — ENOXAPARIN SODIUM 30 MG/0.3ML IJ SOSY
30.0000 mg | PREFILLED_SYRINGE | INTRAMUSCULAR | Status: DC
  Administered 2020-11-16: 30 mg via SUBCUTANEOUS
  Filled 2020-11-15: qty 0.3

## 2020-11-15 MED ORDER — POTASSIUM CHLORIDE CRYS ER 20 MEQ PO TBCR
40.0000 meq | EXTENDED_RELEASE_TABLET | Freq: Once | ORAL | Status: AC
  Administered 2020-11-15: 40 meq via ORAL
  Filled 2020-11-15: qty 2

## 2020-11-15 NOTE — Plan of Care (Signed)

## 2020-11-15 NOTE — Progress Notes (Addendum)
PROGRESS NOTE    Cheyenne [name]  [id number] DOB: 04-06-1950 DOA: 11/10/2020 PCP: Toni Arthurs, PA   Brief Narrative:   Cheyenne [name] is a 70 y.o. female with medical history significant for type II DM, CVA, hypertension, hyperlipidemia, COPD, tobacco use who presents to the emergency department due to 2 day onset of constant lower abdominal pain.  She was also noted to have some associated nausea and vomiting and was admitted for enterocolitis based on CT findings.  She has been started on IV fluid as well as IV Zosyn.  She was noted to have hyperglycemia secondary to type 2 diabetes which has now resolved.  She appears to be tolerating more of her soft diet slowly, but continues to have significant abdominal pain.  She continues to have some AKI as well as hypokalemia today.  Assessment & Plan:   Principal Problem:   Enterocolitis Active Problems:   Essential hypertension   Cerebrovascular accident (CVA) ([place])   Abdominal pain   Nausea & vomiting   AKI (acute kidney injury) ([place])   Hyperglycemia due to diabetes mellitus (HCC)   Leukocytosis   Mixed hyperlipidemia   Tobacco use   Asthma   SIRS (systemic inflammatory response syndrome) (HCC)   Restless leg syndrome   Vitamin D deficiency   Enteritis   Sepsis present on admission secondary to enterocolitis-slowly improving -Patient patient still not tolerating much overnight -Repeat CT abdomen/pelvis with contrast on 9/12 with findings of focal peritoneal irritation, but enteritis appears to be improving -Continue IV Zosyn -Blood cultures with no growth noted thus far -Monitor leukocytosis with repeat CBC in a.m.   AKI -Continue some IV fluid hydration as patient is not eating well -Baseline creatinine 0.7-0.9 -Continue IV fluid hydration, likely prerenal -Hold losartan and chlorthalidone -Monitor strict I's and O's, currently nothing recorded   Hypokalemia -Replete and reevaluate in a.m. along with  magnesium -Likely related to recent diarrhea with C. difficile negative and GI panel negative   Hyperglycemia secondary to type 2 diabetes-improved - 10 units Levemir twice daily as ordered and SSI   Essential hypertension -IV labetalol added for poor BP control -Continue Norvasc -DC chlorthalidone and Cozaar 9/13 due to worsening AKI   History of CVA/dyslipidemia -Lipitor -Continue aspirin   Restless leg syndrome -Requip   History of COPD with ongoing tobacco abuse -Counseled on cessation -Albuterol as needed for shortness of breath or wheezing     DVT prophylaxis: Lovenox Code Status: Full Family Communication: None at bedside Disposition Plan:  Status is: Inpatient   Remains inpatient appropriate because:IV treatments appropriate due to intensity of illness or inability to take PO and Inpatient level of care appropriate due to severity of illness   Dispo: The patient is from: Home              Anticipated d/c is to: SNF              Patient currently is not medically stable to d/c.              Difficult to place patient No     Consultants:  None   Procedures:  See below  Antimicrobials:  Anti-infectives (From admission, onward)    Start     Dose/Rate Route Frequency Ordered Stop   11/10/20 1400  piperacillin-tazobactam (ZOSYN) IVPB 3.375 g        3.375 g 12.5 mL/hr over 240 Minutes Intravenous Every 8 hours 11/10/20 0732     11/10/20 1200  piperacillin-tazobactam (ZOSYN) IVPB 3.375 g  Status:  Discontinued        3.375 g 100 mL/hr over 30 Minutes Intravenous Every 6 hours 11/10/20 0654 11/10/20 0732   11/10/20 0615  piperacillin-tazobactam (ZOSYN) IVPB 3.375 g        3.375 g 12.5 mL/hr over 240 Minutes Intravenous  Once 11/10/20 0603 11/10/20 1038       Subjective: Patient seen and evaluated today with ongoing abdominal pain that is slowly improving.  Her oral intake appears to be slowly improving as well.  She denies any significant bowel  movements.  Objective: Vitals:   11/14/20 0429 11/14/20 1314 11/14/20 2025 11/15/20 0516  BP: (!) 167/81 117/62 (!) 140/56 (!) 128/59  Pulse: (!) 103 (!) 105 99 89  Resp:  '20 18 19  '$ Temp: 97.8 F (36.6 C) (!) 97.5 F (36.4 C) (!) 97.5 F (36.4 C) 97.7 F (36.5 C)  TempSrc: Oral Oral Oral Oral  SpO2: 93% 94% 93%   Weight:      Height:        Intake/Output Summary (Last 24 hours) at 11/15/2020 1112 Last data filed at 11/15/2020 0900 Gross per 24 hour  Intake 400 ml  Output --  Net 400 ml   Filed Weights   11/10/20 0356  Weight: 63.5 kg    Examination:  General exam: Appears calm and comfortable  Respiratory system: Clear to auscultation. Respiratory effort normal. Cardiovascular system: S1 & S2 heard, RRR.  Gastrointestinal system: Abdomen is soft, tender to palpation over all 4 quadrants Central nervous system: Alert and awake Extremities: No edema Skin: No significant lesions noted Psychiatry: Flat affect.    Data Reviewed: I have personally reviewed following labs and imaging studies  CBC: Recent Labs  Lab 11/11/20 0510 11/12/20 0322 11/13/20 0401 11/14/20 0551 11/15/20 0608  WBC 15.4* 10.9* 6.7 8.1 10.6*  HGB 12.4 12.8 11.0* 13.3 12.5  HCT 38.5 39.7 35.0* 40.4 38.3  MCV 90.0 89.8 90.2 87.6 87.6  PLT 295 270 283 336 [id number]   Basic Metabolic Panel: Recent Labs  Lab 11/10/20 1048 11/11/20 0510 11/12/20 0322 11/13/20 0401 11/14/20 0551 11/15/20 0608  NA 133* 142 142 144 141 144  K 4.2 4.1 3.3* 3.3* 4.0 2.9*  CL 103 112* 115* 116* 109 113*  CO2 16* 22 23 21* 18* 21*  GLUCOSE 579* 155* 57* 66* 108* 77  BUN 38* 27* '21 20 18 '$ 29*  CREATININE 3.11* 1.17* 0.89 0.77 1.19* 1.59*  CALCIUM 9.6 9.2 8.5* 8.2* 8.6* 8.8*  MG 2.0 1.9 2.2 2.2 2.0  --   PHOS  --  3.4  --   --   --   --    GFR: Estimated Creatinine Clearance: 28.8 mL/min (A) (by C-G formula based on SCr of 1.59 mg/dL (H)). Liver Function Tests: Recent Labs  Lab 11/09/20 1720 11/11/20 0510   AST 15 13*  ALT 21 15  ALKPHOS 79 69  BILITOT 0.5 0.5  PROT 7.2 6.5  ALBUMIN 3.9 3.0*   Recent Labs  Lab 11/09/20 1720  LIPASE 20   No results for input(s): AMMONIA in the last 168 hours. Coagulation Profile: Recent Labs  Lab 11/11/20 0510  INR 1.3*   Cardiac Enzymes: No results for input(s): CKTOTAL, CKMB, CKMBINDEX, TROPONINI in the last 168 hours. BNP (last 3 results) No results for input(s): PROBNP in the last 8760 hours. HbA1C: No results for input(s): HGBA1C in the last 72 hours. CBG: Recent Labs  Lab 11/14/20 2022 11/14/20 2342  11/15/20 0358 11/15/20 0713 11/15/20 0735  GLUCAP 223* 225* 100* 60* 64*   Lipid Profile: No results for input(s): CHOL, HDL, LDLCALC, TRIG, CHOLHDL, LDLDIRECT in the last 72 hours. Thyroid Function Tests: No results for input(s): TSH, T4TOTAL, FREET4, T3FREE, THYROIDAB in the last 72 hours. Anemia Panel: No results for input(s): VITAMINB12, FOLATE, FERRITIN, TIBC, IRON, RETICCTPCT in the last 72 hours. Sepsis Labs: No results for input(s): PROCALCITON, LATICACIDVEN in the last 168 hours.  Recent Results (from the past 240 hour(s))  Resp Panel by RT-PCR (Flu A&B, Covid) Nasopharyngeal Swab     Status: None   Collection Time: 11/10/20  6:38 AM   Specimen: Nasopharyngeal Swab; Nasopharyngeal(NP) swabs in vial transport medium  Result Value Ref Range Status   SARS Coronavirus 2 by RT PCR NEGATIVE NEGATIVE Final    Comment: (NOTE) SARS-CoV-2 target nucleic acids are NOT DETECTED.  The SARS-CoV-2 RNA is generally detectable in upper respiratory specimens during the acute phase of infection. The lowest concentration of SARS-CoV-2 viral copies this assay can detect is 138 copies/mL. A negative result does not preclude SARS-Cov-2 infection and should not be used as the sole basis for treatment or other patient management decisions. A negative result may occur with  improper specimen collection/handling, submission of specimen  other than nasopharyngeal swab, presence of viral mutation(s) within the areas targeted by this assay, and inadequate number of viral copies(<138 copies/mL). A negative result must be combined with clinical observations, patient history, and epidemiological information. The expected result is Negative.  Fact Sheet for Patients:  EntrepreneurPulse.com.au  Fact Sheet for Healthcare Providers:  IncredibleEmployment.be  This test is no t yet approved or cleared by the [place] FDA and  has been authorized for detection and/or diagnosis of SARS-CoV-2 by FDA under an Emergency Use Authorization (EUA). This EUA will remain  in effect (meaning this test can be used) for the duration of the COVID-19 declaration under Section 564(b)(1) of the Act, 21 U.S.C.section 360bbb-3(b)(1), unless the authorization is terminated  or revoked sooner.       Influenza A by PCR NEGATIVE NEGATIVE Final   Influenza B by PCR NEGATIVE NEGATIVE Final    Comment: (NOTE) The Xpert Xpress SARS-CoV-2/FLU/RSV plus assay is intended as an aid in the diagnosis of influenza from Nasopharyngeal swab specimens and should not be used as a sole basis for treatment. Nasal washings and aspirates are unacceptable for Xpert Xpress SARS-CoV-2/FLU/RSV testing.  Fact Sheet for Patients: EntrepreneurPulse.com.au  Fact Sheet for Healthcare Providers: IncredibleEmployment.be  This test is not yet approved or cleared by the [place] FDA and has been authorized for detection and/or diagnosis of SARS-CoV-2 by FDA under an Emergency Use Authorization (EUA). This EUA will remain in effect (meaning this test can be used) for the duration of the COVID-19 declaration under Section 564(b)(1) of the Act, 21 U.S.C. section 360bbb-3(b)(1), unless the authorization is terminated or revoked.  Performed at [facility], [address], [place], [place]  [address]   Blood culture (routine x 2)     Status: None   Collection Time: 11/10/20  7:18 AM   Specimen: Right Antecubital; Blood  Result Value Ref Range Status   Specimen Description RIGHT ANTECUBITAL  Final   Special Requests   Final    BOTTLES DRAWN AEROBIC AND ANAEROBIC Blood Culture adequate volume   Culture   Final    NO GROWTH 5 DAYS Performed at [facility], [address], [place], [place] [address]    Report Status  11/15/2020 FINAL  Final  Blood culture (routine x 2)     Status: None   Collection Time: 11/10/20  7:19 AM   Specimen: BLOOD RIGHT WRIST  Result Value Ref Range Status   Specimen Description BLOOD RIGHT WRIST  Final   Special Requests   Final    Blood Culture adequate volume BOTTLES DRAWN AEROBIC AND ANAEROBIC   Culture   Final    NO GROWTH 5 DAYS Performed at [facility], [address], [place], [place] [address]    Report Status 11/15/2020 FINAL  Final  MRSA Next Gen by PCR, Nasal     Status: None   Collection Time: 11/10/20  2:42 PM   Specimen: Nasal Mucosa; Nasal Swab  Result Value Ref Range Status   MRSA by PCR Next Gen NOT DETECTED NOT DETECTED Final    Comment: (NOTE) The GeneXpert MRSA Assay (FDA approved for NASAL specimens only), is one component of a comprehensive MRSA colonization surveillance program. It is not intended to diagnose MRSA infection nor to guide or monitor treatment for MRSA infections. Test performance is not FDA approved in patients less than 43 years old. Performed at [facility], [address] [place], [place] [address]   Gastrointestinal Panel by PCR , Stool     Status: None   Collection Time: 11/13/20  3:00 PM   Specimen: Stool  Result Value Ref Range Status   Campylobacter species NOT DETECTED NOT DETECTED Final   Plesimonas shigelloides NOT DETECTED NOT DETECTED Final   Salmonella species NOT DETECTED NOT DETECTED Final   Yersinia enterocolitica NOT DETECTED NOT DETECTED Final   Vibrio species NOT DETECTED NOT  DETECTED Final   Vibrio cholerae NOT DETECTED NOT DETECTED Final   Enteroaggregative E coli (EAEC) NOT DETECTED NOT DETECTED Final   Enteropathogenic E coli (EPEC) NOT DETECTED NOT DETECTED Final   Enterotoxigenic E coli (ETEC) NOT DETECTED NOT DETECTED Final   Shiga like toxin producing E coli (STEC) NOT DETECTED NOT DETECTED Final   Shigella/Enteroinvasive E coli (EIEC) NOT DETECTED NOT DETECTED Final   Cryptosporidium NOT DETECTED NOT DETECTED Final   Cyclospora cayetanensis NOT DETECTED NOT DETECTED Final   Entamoeba histolytica NOT DETECTED NOT DETECTED Final   Giardia lamblia NOT DETECTED NOT DETECTED Final   Adenovirus F40/41 NOT DETECTED NOT DETECTED Final   Astrovirus NOT DETECTED NOT DETECTED Final   Norovirus GI/GII NOT DETECTED NOT DETECTED Final   Rotavirus A NOT DETECTED NOT DETECTED Final   Sapovirus (I, II, IV, and V) NOT DETECTED NOT DETECTED Final    Comment: Performed at [facility], [address] [place], [place] [address]  C Difficile Quick Screen (NO PCR Reflex)     Status: None   Collection Time: 11/13/20  3:00 PM   Specimen: Stool  Result Value Ref Range Status   C Diff antigen NEGATIVE NEGATIVE Final   C Diff toxin NEGATIVE NEGATIVE Final   C Diff interpretation No C. difficile detected.  Final    Comment: Performed at [facility], [address], [place], [place] [address]         Radiology Studies: CT ABDOMEN PELVIS W CONTRAST  Result Date: 11/14/2020 CLINICAL DATA:  Worsening abdominal pain with nausea, vomiting EXAM: CT ABDOMEN AND PELVIS WITH CONTRAST TECHNIQUE: Multidetector CT imaging of the abdomen and pelvis was performed using the standard protocol following bolus administration of intravenous contrast. CONTRAST:  98m OMNIPAQUE IOHEXOL 350 MG/ML SOLN COMPARISON:  CT abdomen/pelvis 11/10/2020, 11/27/2017 FINDINGS: Lower chest:  There are bibasilar consolidations, increased since 11/10/2020. There is a 7 mm nodule in the subpleural right  middle lobe, present since 2016 and likely benign. Hepatobiliary: The liver and gallbladder are normal. There is no biliary ductal dilatation. Pancreas: Unremarkable. Spleen: Unremarkable. Adrenals/Urinary Tract: A 1.0 cm right adrenal nodule is unchanged. Thickening of the left adrenal is unchanged. Subcentimeter hypodense lesions in the left kidney are too small to characterize, but likely reflect small cysts. There are no other focal lesions. There are no stones. There is no hydronephrosis or hydroureter. The bladder is unremarkable. Stomach/Bowel: The stomach is unremarkable. There is no evidence of bowel obstruction. Previously seen small bowel wall thickening appears improved. There is mild hyperemia of some loops of small bowel in the lower abdomen. The bowel is otherwise unremarkable. Vascular/Lymphatic: There is extensive calcified atherosclerotic plaque throughout the nonaneurysmal abdominal aorta. The major branch vessels are patent. The main portal and splenic veins are patent. There is a 7 mm portacaval lymph node, nonspecific and unchanged. There is no new or progressive lymphadenopathy in the abdomen or pelvis. Reproductive: A calcified fundal fibroid is unchanged. The uterus and adnexa are otherwise unremarkable. Other: There is moderate volume ascites throughout the abdomen and pelvis, overall similar in volume to the prior study. There is new mild enhancement along the right anterior peritoneum in the lower abdomen/pelvis (2-65). There is also mild enhancement along the posterior margin of the pelvic fluid (2-71). There is no free intraperitoneal air. Musculoskeletal: There is marked degenerative change at L4-L5. There is no aggressive osseous lesion or acute osseous abnormality. IMPRESSION: 1. Overall similar volume of ascites throughout the abdomen; however, there is new enhancement of the peritoneal lining in the lower abdomen/pelvis consistent with peritoneal irritation/inflammation. No  evidence of organized or drainable fluid collection in the abdomen or pelvis. 2. Decreased small bowel wall thickening suggests improving enteritis. 3. Bibasilar consolidations likely reflect atelectasis, though infection can not be entirely excluded. 4. Stable adrenal adenoma and Aortic Atherosclerosis (ICD10-I70.0). Electronically Signed   By: Valetta Mole M.D.   On: 11/14/2020 14:15        Scheduled Meds:  amLODipine  10 mg Oral Daily   aspirin EC  81 mg Oral Daily   atorvastatin  80 mg Oral Daily   Chlorhexidine Gluconate Cloth  6 each Topical Daily   chlorthalidone  50 mg Oral q morning   cholecalciferol  1,000 Units Oral Daily   [START ON 11/16/2020] enoxaparin (LOVENOX) injection  30 mg Subcutaneous Q24H   escitalopram  10 mg Oral Daily   insulin aspart  0-20 Units Subcutaneous Q4H   insulin detemir  10 Units Subcutaneous BID   losartan  50 mg Oral BID   potassium chloride  40 mEq Oral Once   rOPINIRole  0.25 mg Oral QHS   saccharomyces boulardii  250 mg Oral BID   Continuous Infusions:  lactated ringers     piperacillin-tazobactam (ZOSYN)  IV 3.375 g (11/15/20 0522)   potassium chloride       LOS: 5 days    Time spent: 35 minutes    Jeremaine Maraj Darleen Crocker, DO [facility]  If 7PM-7AM, please contact night-coverage [email or web address] 11/15/2020, 11:12 AM

## 2020-11-15 NOTE — Progress Notes (Signed)
Inpatient Diabetes Program Recommendations  AACE/ADA: New Consensus Statement on Inpatient Glycemic Control (2015)  Target Ranges:  Prepandial:   less than 140 mg/dL      Peak postprandial:   less than 180 mg/dL (1-2 hours)      Critically ill patients:  140 - 180 mg/dL   Lab Results  Component Value Date   GLUCAP 64 (L) 11/15/2020   HGBA1C 9.0 (H) 10/25/2020   HGBA1C 9.1 (H) 10/25/2020    Review of Glycemic Control Results for LORRYN, WELDEN (MRN [id number]) as of 11/15/2020 09:27  Ref. Range 11/14/2020 23:42 11/15/2020 03:58 11/15/2020 07:13 11/15/2020 07:35  Glucose-Capillary Latest Ref Range: 70 - 99 mg/dL 225 (H) 100 (H) 60 (L) 64 (L)   Diabetes history: Type 2 dM Outpatient Diabetes medications: NPH 13 units BID, Novolog 8 units TID, Amaryl 1 mg QD Current orders for Inpatient glycemic control: Novolog 0-20 units Q4H, Levemir 10 units BID  Inpatient Diabetes Program Recommendations:    Noted hypoglycemia this AM.  Consider: -Decreasing Levemir to 10 units QD -Changing correction to Novolog 0-6 units Q4H until po intake improves.   Thanks, Bronson Curb, MSN, RNC-OB Diabetes Coordinator [phone] (8a-5p)

## 2020-11-15 NOTE — Progress Notes (Signed)
Physical Therapy Treatment Patient Details Name: Cheyenne [name] MRN: [id number] DOB: 1950-12-22 Today's Date: 11/15/2020   History of Present Illness Cheyenne [name] is a 70 y.o. female with medical history significant for type II DM, CVA, hypertension, hyperlipidemia, COPD, tobacco use who presents to the emergency department due to 2 day onset of constant lower abdominal pain.  She was also noted to have some associated nausea and vomiting and was admitted for enterocolitis based on CT findings.  She has been started on IV fluid as well as IV Zosyn.  She was noted to have hyperglycemia secondary to type 2 diabetes which has now resolved.    PT Comments    Pt confused through session required frequent reminding for task at hand.  Pt able to recall name and DOB, unaware of where she is at or why.  Pt able to complete bed mobility with mod A, slow labored movements with some reports of abdominal pain during movements. Upon sitting pt with tendency to shut eyes though reports she is not tired and had a good night sleep.  Required mod A with standing, cueing for hand placement to assist with standing from bed as well as hand placement on RW.  Able to sidestep to chair with no LOB, slow labored movements.  EOS pt left in chair with call bell within reach and chair alarm set.  Reports abdominal pain has reduced upon sitting.     Recommendations for follow up therapy are one component of a multi-disciplinary discharge planning process, led by the attending physician.  Recommendations may be updated based on patient status, additional functional criteria and insurance authorization.  Follow Up Recommendations  SNF;Supervision for mobility/OOB;Supervision - Intermittent     Equipment Recommendations  None recommended by PT    Recommendations for Other Services       Precautions / Restrictions Precautions Precautions: Fall     Mobility  Bed Mobility Overal bed mobility: Needs Assistance Bed  Mobility: Supine to Sit     Supine to sit: Mod assist;HOB elevated     General bed mobility comments: slow labored movement requiring use of bed rail and Mod assist    Transfers Overall transfer level: Needs assistance Equipment used: Rolling walker (2 wheeled) Transfers: Sit to/from Stand Sit to Stand: Min assist         General transfer comment: very slow labored movement with frequent c/o of stomach pain  Ambulation/Gait Ambulation/Gait assistance: Mod assist Gait Distance (Feet): 5 Feet Assistive device: Rolling walker (2 wheeled) Gait Pattern/deviations: Decreased step length - right;Decreased step length - left;Decreased stride length Gait velocity: decreased   General Gait Details: limited to a few slow labored steps at bedsid before having to sit due to fatigue   Stairs             Wheelchair Mobility    Modified Rankin (Stroke Patients Only)       Balance                                            Cognition Arousal/Alertness: Awake/alert Behavior During Therapy: WFL for tasks assessed/performed Overall Cognitive Status: Difficult to assess                                 General Comments: Pt knows her name and DOB,  unaware of date, place or situation.  Required frequent redirectment as forgets task on hand      Exercises      General Comments        Pertinent Vitals/Pain Pain Assessment: No/denies pain Faces Pain Scale: Hurts even more Pain Location: Right lower abdomen with movement Pain Descriptors / Indicators: Grimacing;Moaning;Discomfort Pain Intervention(s): Limited activity within patient's tolerance;Monitored during session;Repositioned    Home Living                      Prior Function            PT Goals (current goals can now be found in the care plan section)      Frequency    Min 3X/week      PT Plan Current plan remains appropriate    Co-evaluation               AM-PAC PT "6 Clicks" Mobility   Outcome Measure  Help needed turning from your back to your side while in a flat bed without using bedrails?: A Little Help needed moving from lying on your back to sitting on the side of a flat bed without using bedrails?: A Lot Help needed moving to and from a bed to a chair (including a wheelchair)?: A Lot Help needed standing up from a chair using your arms (e.g., wheelchair or bedside chair)?: A Lot Help needed to walk in hospital room?: A Lot Help needed climbing 3-5 steps with a railing? : A Lot 6 Click Score: 13    End of Session Equipment Utilized During Treatment: Gait belt Activity Tolerance: Patient tolerated treatment well;Patient limited by fatigue Patient left: in chair;with call bell/phone within reach;with chair alarm set Nurse Communication: Mobility status PT Visit Diagnosis: Unsteadiness on feet (R26.81);Other abnormalities of gait and mobility (R26.89);Muscle weakness (generalized) (M62.81)     Time: 0900-0930 PT Time Calculation (min) (ACUTE ONLY): 30 min  Charges:  $Gait Training: 8-22 mins $Therapeutic Activity: 8-22 mins                     Ihor Austin, LPTA/CLT; CBIS [phone]  Aldona Lento 11/15/2020, 10:49 AM

## 2020-11-16 DIAGNOSIS — I639 Cerebral infarction, unspecified: Secondary | ICD-10-CM | POA: Diagnosis not present

## 2020-11-16 DIAGNOSIS — K529 Noninfective gastroenteritis and colitis, unspecified: Secondary | ICD-10-CM | POA: Diagnosis not present

## 2020-11-16 DIAGNOSIS — R1030 Lower abdominal pain, unspecified: Secondary | ICD-10-CM | POA: Diagnosis not present

## 2020-11-16 DIAGNOSIS — N179 Acute kidney failure, unspecified: Secondary | ICD-10-CM | POA: Diagnosis not present

## 2020-11-16 LAB — BASIC METABOLIC PANEL
Anion gap: 8 (ref 5–15)
BUN: 15 mg/dL (ref 8–23)
CO2: 23 mmol/L (ref 22–32)
Calcium: 8.1 mg/dL — ABNORMAL LOW (ref 8.9–10.3)
Chloride: 108 mmol/L (ref 98–111)
Creatinine, Ser: 0.87 mg/dL (ref 0.44–1.00)
GFR, Estimated: >60 mL/min (ref >60)
Glucose, Bld: 60 mg/dL — ABNORMAL LOW (ref 70–99)
Potassium: 2.9 mmol/L — ABNORMAL LOW (ref 3.5–5.1)
Sodium: 139 mmol/L (ref 135–145)

## 2020-11-16 LAB — CBC
HCT: 37.5 % (ref 36.0–46.0)
Hemoglobin: 12.4 g/dL (ref 12.0–15.0)
MCH: 29 pg (ref 26.0–34.0)
MCHC: 33.1 g/dL (ref 30.0–36.0)
MCV: 87.6 fL (ref 80.0–100.0)
Platelets: 357 10*3/uL (ref 150–400)
RBC: 4.28 MIL/uL (ref 3.87–5.11)
RDW: 14.6 % (ref 11.5–15.5)
WBC: 8.5 10*3/uL (ref 4.0–10.5)
nRBC: 0 % (ref 0.0–0.2)

## 2020-11-16 LAB — GLUCOSE, CAPILLARY
Glucose-Capillary: 119 mg/dL — ABNORMAL HIGH (ref 70–99)
Glucose-Capillary: 200 mg/dL — ABNORMAL HIGH (ref 70–99)
Glucose-Capillary: 221 mg/dL — ABNORMAL HIGH (ref 70–99)
Glucose-Capillary: 69 mg/dL — ABNORMAL LOW (ref 70–99)
Glucose-Capillary: 74 mg/dL (ref 70–99)
Glucose-Capillary: 78 mg/dL (ref 70–99)

## 2020-11-16 LAB — MAGNESIUM: Magnesium: 2 mg/dL (ref 1.7–2.4)

## 2020-11-16 MED ORDER — ENOXAPARIN SODIUM 40 MG/0.4ML IJ SOSY
40.0000 mg | PREFILLED_SYRINGE | INTRAMUSCULAR | Status: DC
  Administered 2020-11-17 – 2020-11-22 (×6): 40 mg via SUBCUTANEOUS
  Filled 2020-11-16 (×6): qty 0.4

## 2020-11-16 MED ORDER — INSULIN DETEMIR 100 UNIT/ML ~~LOC~~ SOLN
8.0000 [IU] | Freq: Two times a day (BID) | SUBCUTANEOUS | Status: DC
  Filled 2020-11-16 (×2): qty 0.08

## 2020-11-16 MED ORDER — POTASSIUM CHLORIDE CRYS ER 20 MEQ PO TBCR
60.0000 meq | EXTENDED_RELEASE_TABLET | Freq: Once | ORAL | Status: AC
  Administered 2020-11-16: 60 meq via ORAL
  Filled 2020-11-16: qty 3

## 2020-11-16 MED ORDER — INSULIN ASPART 100 UNIT/ML IJ SOLN
0.0000 [IU] | Freq: Three times a day (TID) | INTRAMUSCULAR | Status: DC
  Administered 2020-11-16 – 2020-11-17 (×2): 1 [IU] via SUBCUTANEOUS
  Administered 2020-11-17: 3 [IU] via SUBCUTANEOUS
  Administered 2020-11-17: 6 [IU] via SUBCUTANEOUS
  Administered 2020-11-18 – 2020-11-19 (×4): 1 [IU] via SUBCUTANEOUS
  Administered 2020-11-20: 3 [IU] via SUBCUTANEOUS
  Administered 2020-11-20: 1 [IU] via SUBCUTANEOUS
  Administered 2020-11-20: 2 [IU] via SUBCUTANEOUS
  Administered 2020-11-21: 1 [IU] via SUBCUTANEOUS
  Administered 2020-11-21: 4 [IU] via SUBCUTANEOUS
  Administered 2020-11-22: 3 [IU] via SUBCUTANEOUS
  Administered 2020-11-22: 1 [IU] via SUBCUTANEOUS
  Administered 2020-11-22: 3 [IU] via SUBCUTANEOUS

## 2020-11-16 MED ORDER — INSULIN DETEMIR 100 UNIT/ML ~~LOC~~ SOLN
8.0000 [IU] | Freq: Every day | SUBCUTANEOUS | Status: DC
  Administered 2020-11-17 – 2020-11-22 (×6): 8 [IU] via SUBCUTANEOUS
  Filled 2020-11-16 (×9): qty 0.08

## 2020-11-16 NOTE — Progress Notes (Signed)
PROGRESS NOTE    Cheyenne [name]  [id number] DOB: [birth year] DOA: 11/10/2020 PCP: Toni Arthurs, PA   Brief Narrative:   Cheyenne [name] is a 70 y.o. female with medical history significant for type II DM, CVA, hypertension, hyperlipidemia, COPD, tobacco use who presents to the emergency department due to 2 day onset of constant lower abdominal pain.  She was also noted to have some associated nausea and vomiting and was admitted for enterocolitis based on CT findings.  She has been started on IV fluid as well as IV Zosyn.  She was noted to have hyperglycemia secondary to type 2 diabetes which has now resolved.  She appears to be tolerating more of her soft diet slowly, but continues to have significant abdominal pain.  She continues to have some AKI as well as hypokalemia today.  Assessment & Plan:   Principal Problem:   Enterocolitis Active Problems:   Essential hypertension   Cerebrovascular accident (CVA) ([place])   Abdominal pain   Nausea & vomiting   AKI (acute kidney injury) ([place])   Hyperglycemia due to diabetes mellitus (HCC)   Leukocytosis   Mixed hyperlipidemia   Tobacco use   Asthma   SIRS (systemic inflammatory response syndrome) (HCC)   Restless leg syndrome   Vitamin D deficiency   Enteritis  Sepsis present on admission secondary to enterocolitis-slowly improving -Patient patient still not tolerating much overnight -Repeat CT abdomen/pelvis with contrast on 9/12 with findings of focal peritoneal irritation, but enteritis appears to be improving -Continue IV Zosyn for now, hopefully can transition to oral therapy soon -Blood cultures with no growth noted thus far -Monitor leukocytosis with repeat CBC in a.m.   Prerenal AKI - RESOLVED  -Continue some IV fluid hydration as patient is not eating well -Baseline creatinine 0.7-0.9 -Reduce IV fluid hydrationl -Hold losartan and chlorthalidone -Monitor strict I's and O's, currently nothing recorded    Hypokalemia -Replete and reevaluate in a.m.  Additional oral replacement ordered.  -Likely related to recent diarrhea with C. difficile negative and GI panel negative   Hyperglycemia secondary to type 2 diabetes-improved Hypoglycemia from insulin  - reduced levemir and SSI coverage, CBG 5x per day   Essential hypertension -IV labetalol added for poor BP control -Continue Norvasc -DC chlorthalidone and Cozaar 9/13 due to worsening AKI   History of CVA/dyslipidemia -Lipitor -Continue aspirin   Restless leg syndrome -Requip   History of COPD with ongoing tobacco abuse -Counseled on cessation -Albuterol as needed for shortness of breath or wheezing     DVT prophylaxis: Lovenox Code Status: Full Family Communication: updated patient at bedside, who verbalized understanding Disposition Plan: TBD Status is: Inpatient   Remains inpatient appropriate because:IV treatments appropriate due to intensity of illness or inability to take PO and Inpatient level of care appropriate due to severity of illness   Dispo: The patient is from: Home              Anticipated d/c is to: SNF              Patient currently is not medically stable to d/c.              Difficult to place patient No     Consultants:  None   Procedures:  See below  Antimicrobials:  Anti-infectives (From admission, onward)    Start     Dose/Rate Route Frequency Ordered Stop   11/10/20 1400  piperacillin-tazobactam (ZOSYN) IVPB 3.375 g  3.375 g 12.5 mL/hr over 240 Minutes Intravenous Every 8 hours 11/10/20 0732     11/10/20 1200  piperacillin-tazobactam (ZOSYN) IVPB 3.375 g  Status:  Discontinued        3.375 g 100 mL/hr over 30 Minutes Intravenous Every 6 hours 11/10/20 0654 11/10/20 0732   11/10/20 0615  piperacillin-tazobactam (ZOSYN) IVPB 3.375 g        3.375 g 12.5 mL/hr over 240 Minutes Intravenous  Once 11/10/20 0603 11/10/20 1038       Subjective: Patient reports that she is still having  abdominal pain, she seems to be tolerating her diet so far.  No emesis.   Objective: Vitals:   11/15/20 1245 11/15/20 2017 11/15/20 2111 11/16/20 0504  BP: 126/61 (!) 148/69    Pulse: 91 90    Resp: 20 19    Temp: (!) 97.5 F (36.4 C)  98.4 F (36.9 C) 98 F (36.7 C)  TempSrc: Oral     SpO2: 95% 94%    Weight:      Height:        Intake/Output Summary (Last 24 hours) at 11/16/2020 1412 Last data filed at 11/16/2020 0609 Gross per 24 hour  Intake 1288.03 ml  Output 1450 ml  Net -161.97 ml   Filed Weights   11/10/20 0356  Weight: 63.5 kg   Examination:  General exam: Appears calm and comfortable NAD Respiratory system: Clear to auscultation. Respiratory effort normal. Cardiovascular system: normal S1 & S2 heard.  Gastrointestinal system: Abdomen is soft, very tender to light palpation over all 4 quadrants Central nervous system: Alert and awake Extremities: No edema Skin: No significant lesions noted Psychiatry: normal affect.  Data Reviewed: I have personally reviewed following labs and imaging studies  CBC: Recent Labs  Lab 11/12/20 0322 11/13/20 0401 11/14/20 0551 11/15/20 0608 11/16/20 0503  WBC 10.9* 6.7 8.1 10.6* 8.5  HGB 12.8 11.0* 13.3 12.5 12.4  HCT 39.7 35.0* 40.4 38.3 37.5  MCV 89.8 90.2 87.6 87.6 87.6  PLT 270 283 336 358 [id number]   Basic Metabolic Panel: Recent Labs  Lab 11/11/20 0510 11/12/20 0322 11/13/20 0401 11/14/20 0551 11/15/20 0608 11/16/20 0503  NA 142 142 144 141 144 139  K 4.1 3.3* 3.3* 4.0 2.9* 2.9*  CL 112* 115* 116* 109 113* 108  CO2 22 23 21* 18* 21* 23  GLUCOSE 155* 57* 66* 108* 77 60*  BUN 27* '21 20 18 '$ 29* 15  CREATININE 1.17* 0.89 0.77 1.19* 1.59* 0.87  CALCIUM 9.2 8.5* 8.2* 8.6* 8.8* 8.1*  MG 1.9 2.2 2.2 2.0  --  2.0  PHOS 3.4  --   --   --   --   --    GFR: Estimated Creatinine Clearance: 52.7 mL/min (by C-G formula based on SCr of 0.87 mg/dL). Liver Function Tests: Recent Labs  Lab 11/09/20 1720 11/11/20 0510   AST 15 13*  ALT 21 15  ALKPHOS 79 69  BILITOT 0.5 0.5  PROT 7.2 6.5  ALBUMIN 3.9 3.0*   Recent Labs  Lab 11/09/20 1720  LIPASE 20   No results for input(s): AMMONIA in the last 168 hours. Coagulation Profile: Recent Labs  Lab 11/11/20 0510  INR 1.3*   Cardiac Enzymes: No results for input(s): CKTOTAL, CKMB, CKMBINDEX, TROPONINI in the last 168 hours. BNP (last 3 results) No results for input(s): PROBNP in the last 8760 hours. HbA1C: No results for input(s): HGBA1C in the last 72 hours. CBG: Recent Labs  Lab 11/15/20 2037 11/16/20  0011 11/16/20 0636 11/16/20 0750 11/16/20 1148  GLUCAP 188* 119* 78 74 69*   Lipid Profile: No results for input(s): CHOL, HDL, LDLCALC, TRIG, CHOLHDL, LDLDIRECT in the last 72 hours. Thyroid Function Tests: No results for input(s): TSH, T4TOTAL, FREET4, T3FREE, THYROIDAB in the last 72 hours. Anemia Panel: No results for input(s): VITAMINB12, FOLATE, FERRITIN, TIBC, IRON, RETICCTPCT in the last 72 hours. Sepsis Labs: No results for input(s): PROCALCITON, LATICACIDVEN in the last 168 hours.  Recent Results (from the past 240 hour(s))  Resp Panel by RT-PCR (Flu A&B, Covid) Nasopharyngeal Swab     Status: None   Collection Time: 11/10/20  6:38 AM   Specimen: Nasopharyngeal Swab; Nasopharyngeal(NP) swabs in vial transport medium  Result Value Ref Range Status   SARS Coronavirus 2 by RT PCR NEGATIVE NEGATIVE Final    Comment: (NOTE) SARS-CoV-2 target nucleic acids are NOT DETECTED.  The SARS-CoV-2 RNA is generally detectable in upper respiratory specimens during the acute phase of infection. The lowest concentration of SARS-CoV-2 viral copies this assay can detect is 138 copies/mL. A negative result does not preclude SARS-Cov-2 infection and should not be used as the sole basis for treatment or other patient management decisions. A negative result may occur with  improper specimen collection/handling, submission of specimen  other than nasopharyngeal swab, presence of viral mutation(s) within the areas targeted by this assay, and inadequate number of viral copies(<138 copies/mL). A negative result must be combined with clinical observations, patient history, and epidemiological information. The expected result is Negative.  Fact Sheet for Patients:  EntrepreneurPulse.com.au  Fact Sheet for Healthcare Providers:  IncredibleEmployment.be  This test is no t yet approved or cleared by the [place] FDA and  has been authorized for detection and/or diagnosis of SARS-CoV-2 by FDA under an Emergency Use Authorization (EUA). This EUA will remain  in effect (meaning this test can be used) for the duration of the COVID-19 declaration under Section 564(b)(1) of the Act, 21 U.S.C.section 360bbb-3(b)(1), unless the authorization is terminated  or revoked sooner.       Influenza A by PCR NEGATIVE NEGATIVE Final   Influenza B by PCR NEGATIVE NEGATIVE Final    Comment: (NOTE) The Xpert Xpress SARS-CoV-2/FLU/RSV plus assay is intended as an aid in the diagnosis of influenza from Nasopharyngeal swab specimens and should not be used as a sole basis for treatment. Nasal washings and aspirates are unacceptable for Xpert Xpress SARS-CoV-2/FLU/RSV testing.  Fact Sheet for Patients: EntrepreneurPulse.com.au  Fact Sheet for Healthcare Providers: IncredibleEmployment.be  This test is not yet approved or cleared by the [place] FDA and has been authorized for detection and/or diagnosis of SARS-CoV-2 by FDA under an Emergency Use Authorization (EUA). This EUA will remain in effect (meaning this test can be used) for the duration of the COVID-19 declaration under Section 564(b)(1) of the Act, 21 U.S.C. section 360bbb-3(b)(1), unless the authorization is terminated or revoked.  Performed at [facility], [address] [place], [place]  [address]   Blood culture (routine x 2)     Status: None   Collection Time: 11/10/20  7:18 AM   Specimen: Right Antecubital; Blood  Result Value Ref Range Status   Specimen Description RIGHT ANTECUBITAL  Final   Special Requests   Final    BOTTLES DRAWN AEROBIC AND ANAEROBIC Blood Culture adequate volume   Culture   Final    NO GROWTH 5 DAYS Performed at [facility], [address] [place], [place] [address]    Report  Status 11/15/2020 FINAL  Final  Blood culture (routine x 2)     Status: None   Collection Time: 11/10/20  7:19 AM   Specimen: BLOOD RIGHT WRIST  Result Value Ref Range Status   Specimen Description BLOOD RIGHT WRIST  Final   Special Requests   Final    Blood Culture adequate volume BOTTLES DRAWN AEROBIC AND ANAEROBIC   Culture   Final    NO GROWTH 5 DAYS Performed at [facility], [address] [place], [place] [address]    Report Status 11/15/2020 FINAL  Final  MRSA Next Gen by PCR, Nasal     Status: None   Collection Time: 11/10/20  2:42 PM   Specimen: Nasal Mucosa; Nasal Swab  Result Value Ref Range Status   MRSA by PCR Next Gen NOT DETECTED NOT DETECTED Final    Comment: (NOTE) The GeneXpert MRSA Assay (FDA approved for NASAL specimens only), is one component of a comprehensive MRSA colonization surveillance program. It is not intended to diagnose MRSA infection nor to guide or monitor treatment for MRSA infections. Test performance is not FDA approved in patients less than 62 years old. Performed at [facility], [address] [place], [place] [address]   Gastrointestinal Panel by PCR , Stool     Status: None   Collection Time: 11/13/20  3:00 PM   Specimen: Stool  Result Value Ref Range Status   Campylobacter species NOT DETECTED NOT DETECTED Final   Plesimonas shigelloides NOT DETECTED NOT DETECTED Final   Salmonella species NOT DETECTED NOT DETECTED Final   Yersinia enterocolitica NOT DETECTED NOT DETECTED Final   Vibrio species NOT DETECTED NOT  DETECTED Final   Vibrio cholerae NOT DETECTED NOT DETECTED Final   Enteroaggregative E coli (EAEC) NOT DETECTED NOT DETECTED Final   Enteropathogenic E coli (EPEC) NOT DETECTED NOT DETECTED Final   Enterotoxigenic E coli (ETEC) NOT DETECTED NOT DETECTED Final   Shiga like toxin producing E coli (STEC) NOT DETECTED NOT DETECTED Final   Shigella/Enteroinvasive E coli (EIEC) NOT DETECTED NOT DETECTED Final   Cryptosporidium NOT DETECTED NOT DETECTED Final   Cyclospora cayetanensis NOT DETECTED NOT DETECTED Final   Entamoeba histolytica NOT DETECTED NOT DETECTED Final   Giardia lamblia NOT DETECTED NOT DETECTED Final   Adenovirus F40/41 NOT DETECTED NOT DETECTED Final   Astrovirus NOT DETECTED NOT DETECTED Final   Norovirus GI/GII NOT DETECTED NOT DETECTED Final   Rotavirus A NOT DETECTED NOT DETECTED Final   Sapovirus (I, II, IV, and V) NOT DETECTED NOT DETECTED Final    Comment: Performed at [facility], [address] [place], [place] [address]  C Difficile Quick Screen (NO PCR Reflex)     Status: None   Collection Time: 11/13/20  3:00 PM   Specimen: Stool  Result Value Ref Range Status   C Diff antigen NEGATIVE NEGATIVE Final   C Diff toxin NEGATIVE NEGATIVE Final   C Diff interpretation No C. difficile detected.  Final    Comment: Performed at [facility], [address], [place], [place] [address]         Radiology Studies: No results found.      Scheduled Meds:  amLODipine  10 mg Oral Daily   aspirin EC  81 mg Oral Daily   atorvastatin  80 mg Oral Daily   Chlorhexidine Gluconate Cloth  6 each Topical Daily   cholecalciferol  1,000 Units Oral Daily   [START ON 11/17/2020] enoxaparin (LOVENOX) injection  40  mg Subcutaneous Q24H   escitalopram  10 mg Oral Daily   insulin aspart  0-6 Units Subcutaneous TID WC   [START ON 11/17/2020] insulin detemir  8 Units Subcutaneous Daily   rOPINIRole  0.25 mg Oral QHS   saccharomyces boulardii  250 mg Oral BID    Continuous Infusions:  lactated ringers 75 mL/hr at 11/16/20 0947   piperacillin-tazobactam (ZOSYN)  IV 3.375 g (11/16/20 1301)     LOS: 6 days    Time spent: 35 minutes  Mateo Overbeck Wynetta Emery, MD How to contact the [facility] Attending or Consulting provider [place] or covering provider during after hours [place], for this patient?  Check the care team in [facility] and look for a) attending/consulting TRH provider listed and b) the [facility] team listed Log into [email or web address] and use [place]'s universal password to access. If you do not have the password, please contact the hospital operator. Locate the [facility] provider you are looking for under [facility] and page to a number that you can be directly reached. If you still have difficulty reaching the provider, please page the [facility] (Director on Call) for the Hospitalists listed on amion for assistance.   If 7PM-7AM, please contact night-coverage [email or web address] 11/16/2020, 2:12 PM

## 2020-11-16 NOTE — Progress Notes (Signed)
Physical Therapy Treatment Patient Details Name: Cheyenne [name] MRN: [id number] DOB: [birth year] Today's Date: 11/16/2020   History of Present Illness Cheyenne [name] is a 70 y.o. female with medical history significant for type II DM, CVA, hypertension, hyperlipidemia, COPD, tobacco use who presents to the emergency department due to 2 day onset of constant lower abdominal pain.  She was also noted to have some associated nausea and vomiting and was admitted for enterocolitis based on CT findings.  She has been started on IV fluid as well as IV Zosyn.  She was noted to have hyperglycemia secondary to type 2 diabetes which has now resolved.    PT Comments    Pt lying in bed, agreeable to therapy today.  PT required min assist from therapist to come to seated position due to only using her Rt UE for mobility and cues to complete task.  Pt unable to establish or maintain sitting balance until therapist flattened out bed.  Pt with cues to push up from bed to stand and AA to place her LT hand on her walker.  Improved ambulation distance of 35 feet today but with short narrow based steps.  PT able to return supine without assist from therapist.  Nursing came in following to help with feeding dinner.     Recommendations for follow up therapy are one component of a multi-disciplinary discharge planning process, led by the attending physician.  Recommendations may be updated based on patient status, additional functional criteria and insurance authorization.  Follow Up Recommendations  SNF;Supervision for mobility/OOB;Supervision - Intermittent     Equipment Recommendations       Recommendations for Other Services       Precautions / Restrictions Precautions Precautions: Fall     Mobility  Bed Mobility Overal bed mobility: Needs Assistance Bed Mobility: Supine to Sit;Sit to Supine     Supine to sit: Min assist Sit to supine: Modified independent (Device/Increase time)   General bed  mobility comments: slow labored movement requiring use of bed rail and Min assist; tends to not use her LT UE    Transfers   Equipment used: Rolling walker (2 wheeled) Transfers: Sit to/from Stand Sit to Stand: Min assist         General transfer comment: Pt with Cues for standing and using UE appropriately.  Ambulates slowly with short steps  Ambulation/Gait Ambulation/Gait assistance: Min assist Gait Distance (Feet): 35 Feet Assistive device: Rolling walker (2 wheeled) Gait Pattern/deviations: Decreased step length - right;Decreased step length - left;Decreased stride length     General Gait Details: No complaints of fatigue or pain with ambulation, requires cues for direction and taking larger steps   Stairs             Wheelchair Mobility    Modified Rankin (Stroke Patients Only)       Balance                                            Cognition Arousal/Alertness: Awake/alert Behavior During Therapy: WFL for tasks assessed/performed Overall Cognitive Status: Difficult to assess                                 General Comments: Pt knows her name and DOB, unaware of date, place or situation.  Required frequent redirectment as  forgets task on hand      Exercises      General Comments        Pertinent Vitals/Pain Pain Assessment: No/denies pain    Home Living                      Prior Function            PT Goals (current goals can now be found in the care plan section) Acute Rehab PT Goals PT Goal Formulation: With patient Progress towards PT goals: Progressing toward goals    Frequency           PT Plan Current plan remains appropriate    Co-evaluation              AM-PAC PT "6 Clicks" Mobility   Outcome Measure  Help needed turning from your back to your side while in a flat bed without using bedrails?: A Little Help needed moving from lying on your back to sitting on the side  of a flat bed without using bedrails?: A Lot Help needed moving to and from a bed to a chair (including a wheelchair)?: A Little Help needed standing up from a chair using your arms (e.g., wheelchair or bedside chair)?: A Little Help needed to walk in hospital room?: A Lot Help needed climbing 3-5 steps with a railing? : A Lot 6 Click Score: 15    End of Session Equipment Utilized During Treatment: Gait belt Activity Tolerance: Patient tolerated treatment well;Patient limited by fatigue Patient left: with call bell/phone within reach;in bed;with bed alarm set;with nursing/sitter in room Nurse Communication: Mobility status PT Visit Diagnosis: Unsteadiness on feet (R26.81);Other abnormalities of gait and mobility (R26.89);Muscle weakness (generalized) (M62.81)     Time: [id number] PT Time Calculation (min) (ACUTE ONLY): 22 min  Charges:  $Gait Training: 8-22 mins                     Teena Irani, PTA/CLT [place], [place] B 11/16/2020, 5:24 PM

## 2020-11-16 NOTE — TOC Progression Note (Signed)
Transition of Care [facility]) - Progression Note    Patient Details  Name: Cheyenne [name] MRN: [id number] Date of Birth: [birth year]  Transition of Care [facility]) CM/SW Contact  Ihor Gully, LCSW Phone Number: 11/16/2020, 3:23 PM  Clinical Narrative:    Bed offers presented to granddaughter, Lanna Poche. Additional referral sent out at her request.    Expected Discharge Plan: Skilled Nursing Facility Barriers to Discharge: Continued Medical Work up  Expected Discharge Plan and Services Expected Discharge Plan: [place]                                               Social Determinants of Health (SDOH) Interventions    Readmission Risk Interventions Readmission Risk Prevention Plan 11/14/2020  Transportation Screening Complete  Home Care Screening Complete  Medication Review (RN CM) Complete  Some recent data might be hidden

## 2020-11-17 ENCOUNTER — Encounter (HOSPITAL_COMMUNITY): Payer: Self-pay | Admitting: Internal Medicine

## 2020-11-17 DIAGNOSIS — N179 Acute kidney failure, unspecified: Secondary | ICD-10-CM | POA: Diagnosis not present

## 2020-11-17 DIAGNOSIS — R1030 Lower abdominal pain, unspecified: Secondary | ICD-10-CM | POA: Diagnosis not present

## 2020-11-17 DIAGNOSIS — K529 Noninfective gastroenteritis and colitis, unspecified: Secondary | ICD-10-CM | POA: Diagnosis not present

## 2020-11-17 DIAGNOSIS — I639 Cerebral infarction, unspecified: Secondary | ICD-10-CM | POA: Diagnosis not present

## 2020-11-17 LAB — GLUCOSE, CAPILLARY
Glucose-Capillary: 255 mg/dL — ABNORMAL HIGH (ref 70–99)
Glucose-Capillary: 251 mg/dL — ABNORMAL HIGH (ref 70–99)
Glucose-Capillary: 154 mg/dL — ABNORMAL HIGH (ref 70–99)
Glucose-Capillary: 140 mg/dL — ABNORMAL HIGH (ref 70–99)

## 2020-11-17 LAB — BASIC METABOLIC PANEL
Anion gap: 12 (ref 5–15)
BUN: 12 mg/dL (ref 8–23)
CO2: 21 mmol/L — ABNORMAL LOW (ref 22–32)
Calcium: 8.3 mg/dL — ABNORMAL LOW (ref 8.9–10.3)
Chloride: 101 mmol/L (ref 98–111)
Creatinine, Ser: 1.14 mg/dL — ABNORMAL HIGH (ref 0.44–1.00)
GFR, Estimated: 52 mL/min — ABNORMAL LOW (ref >60)
Glucose, Bld: 262 mg/dL — ABNORMAL HIGH (ref 70–99)
Potassium: 3.2 mmol/L — ABNORMAL LOW (ref 3.5–5.1)
Sodium: 134 mmol/L — ABNORMAL LOW (ref 135–145)

## 2020-11-17 MED ORDER — LORATADINE 10 MG PO TABS
10.0000 mg | ORAL_TABLET | Freq: Every day | ORAL | Status: DC
  Administered 2020-11-17 – 2020-11-22 (×6): 10 mg via ORAL
  Filled 2020-11-17 (×6): qty 1

## 2020-11-17 MED ORDER — AMOXICILLIN-POT CLAVULANATE 875-125 MG PO TABS
1.0000 | ORAL_TABLET | Freq: Two times a day (BID) | ORAL | Status: AC
  Administered 2020-11-17 – 2020-11-20 (×7): 1 via ORAL
  Filled 2020-11-17 (×7): qty 1

## 2020-11-17 MED ORDER — ENSURE ENLIVE PO LIQD: 237.0000 mL | Freq: Two times a day (BID) | ORAL | Status: DC

## 2020-11-17 MED ORDER — CLONAZEPAM 0.5 MG PO TABS
0.5000 mg | ORAL_TABLET | Freq: Two times a day (BID) | ORAL | Status: DC | PRN
  Administered 2020-11-18 – 2020-11-20 (×3): 0.5 mg via ORAL
  Filled 2020-11-17 (×3): qty 1

## 2020-11-17 MED ORDER — INSULIN ASPART 100 UNIT/ML IJ SOLN
3.0000 [IU] | Freq: Three times a day (TID) | INTRAMUSCULAR | Status: DC
  Administered 2020-11-17 – 2020-11-22 (×16): 3 [IU] via SUBCUTANEOUS

## 2020-11-17 MED ORDER — CLOPIDOGREL BISULFATE 75 MG PO TABS
75.0000 mg | ORAL_TABLET | Freq: Every day | ORAL | Status: DC
  Administered 2020-11-18 – 2020-11-22 (×5): 75 mg via ORAL
  Filled 2020-11-17 (×5): qty 1

## 2020-11-17 MED ORDER — POTASSIUM CHLORIDE CRYS ER 20 MEQ PO TBCR
40.0000 meq | EXTENDED_RELEASE_TABLET | Freq: Once | ORAL | Status: AC
  Administered 2020-11-17: 40 meq via ORAL
  Filled 2020-11-17: qty 2

## 2020-11-17 NOTE — TOC Progression Note (Signed)
Transition of Care [facility]) - Progression Note    Patient Details  Name: Cheyenne [name] MRN: [id number] Date of Birth: 01/01/1951  Transition of Care [facility]) CM/SW Contact  Ihor Gully, LCSW Phone Number: 11/17/2020, 1:23 PM  Clinical Narrative:    Granddaughter accepts bed at [facility]. [name] at [facility] notified and will start [name].     Expected Discharge Plan: Skilled Nursing Facility Barriers to Discharge: Continued Medical Work up  Expected Discharge Plan and Services Expected Discharge Plan: [place]                                               Social Determinants of Health (SDOH) Interventions    Readmission Risk Interventions Readmission Risk Prevention Plan 11/14/2020  Transportation Screening Complete  Home Care Screening Complete  Medication Review (RN CM) Complete  Some recent data might be hidden

## 2020-11-17 NOTE — Care Management Important Message (Signed)
Important Message  Patient Details  Name: Cheyenne [name] MRN: [id number] Date of Birth: October 25, 1950   Medicare Important Message Given:  Yes     Tommy Medal 11/17/2020, 11:57 AM

## 2020-11-17 NOTE — Progress Notes (Signed)
PROGRESS NOTE    Cheyenne [name]  [id number] DOB: [birth year] DOA: 11/10/2020 PCP: Toni Arthurs, PA   Brief Narrative:   Cheyenne [name] is a 70 y.o. female with medical history significant for type II DM, CVA, hypertension, hyperlipidemia, COPD, tobacco use who presents to the emergency department due to 2 day onset of constant lower abdominal pain.  She was also noted to have some associated nausea and vomiting and was admitted for enterocolitis based on CT findings.  She has been started on IV fluid as well as IV Zosyn.  She was noted to have hyperglycemia secondary to type 2 diabetes which has now resolved.  She appears to be tolerating more of her soft diet slowly, but continues to have significant abdominal pain.  She continues to have some AKI as well as hypokalemia today.  Assessment & Plan:   Principal Problem:   Enterocolitis Active Problems:   Essential hypertension   Cerebrovascular accident (CVA) ([place])   Abdominal pain   Nausea & vomiting   AKI (acute kidney injury) ([place])   Hyperglycemia due to diabetes mellitus (HCC)   Leukocytosis   Mixed hyperlipidemia   Tobacco use   Asthma   SIRS (systemic inflammatory response syndrome) (HCC)   Restless leg syndrome   Vitamin D deficiency   Enteritis  Sepsis present on admission secondary to enterocolitis-slowly improving -Patient patient still not tolerating much overnight -Repeat CT abdomen/pelvis with contrast on 9/12 with findings of focal peritoneal irritation, but enteritis appears to be improving -treated with IV Zosyn and transitioned to oral augmentin.  -Blood cultures with no growth noted thus far -leukocytosis has RESOLVED.   Persistent abdominal pain - pain seems out of proportion to labs and CT findings - I have asked for GI consultation to assist with eval and diagnosis, management options   Prerenal AKI - RESOLVED  -Continue some IV fluid hydration as patient is not eating well -Baseline creatinine  0.7-0.9 -Reduce IV fluid hydrationl -Hold losartan and chlorthalidone -Monitor strict I's and O's, currently nothing recorded   Hypokalemia -Replete and reevaluate in a.m.  Additional oral replacement ordered.  -Likely related to recent diarrhea with C. difficile negative and GI panel negative   Hyperglycemia secondary to type 2 diabetes-improved Hypoglycemia from insulin  - reduced levemir and SSI coverage, CBG 5x per day   Essential hypertension -IV labetalol added for poor BP control -Continue Norvasc -DC chlorthalidone and Cozaar 9/13 due to worsening AKI   History of CVA/dyslipidemia -Lipitor -Continue aspirin   Restless leg syndrome -Requip   History of COPD with ongoing tobacco abuse -Counseled on cessation -Albuterol as needed for shortness of breath or wheezing    DVT prophylaxis: Lovenox Code Status: Full Family Communication: updated patient at bedside, who verbalized understanding Disposition Plan: TBD Status is: Inpatient   Remains inpatient appropriate because:IV treatments appropriate due to intensity of illness or inability to take PO and Inpatient level of care appropriate due to severity of illness   Dispo: The patient is from: Home              Anticipated d/c is to: SNF              Patient currently is not medically stable to d/c.              Difficult to place patient No     Consultants:  None   Procedures:  See below  Antimicrobials:  Anti-infectives (From admission, onward)    Start  Dose/Rate Route Frequency Ordered Stop   11/17/20 2200  amoxicillin-clavulanate (AUGMENTIN) 875-125 MG per tablet 1 tablet        1 tablet Oral Every 12 hours 11/17/20 1156 11/19/20 2159   11/10/20 1400  piperacillin-tazobactam (ZOSYN) IVPB 3.375 g  Status:  Discontinued        3.375 g 12.5 mL/hr over 240 Minutes Intravenous Every 8 hours 11/10/20 0732 11/17/20 1156   11/10/20 1200  piperacillin-tazobactam (ZOSYN) IVPB 3.375 g  Status:  Discontinued         3.375 g 100 mL/hr over 30 Minutes Intravenous Every 6 hours 11/10/20 0654 11/10/20 0732   11/10/20 0615  piperacillin-tazobactam (ZOSYN) IVPB 3.375 g        3.375 g 12.5 mL/hr over 240 Minutes Intravenous  Once 11/10/20 0603 11/10/20 1038       Subjective: Patient reports ongoing abdominal pain.  She seems to be able to eat diet so far.     Objective: Vitals:   11/16/20 0504 11/16/20 1511 11/16/20 2021 11/17/20 1344  BP:  138/61 (!) 128/59 129/61  Pulse:  86 85 91  Resp:  '20 18 20  '$ Temp: 98 F (36.7 C) 98.3 F (36.8 C) 99.3 F (37.4 C) 98.1 F (36.7 C)  TempSrc:    Oral  SpO2:  96% 94% 95%  Weight:      Height:       No intake or output data in the 24 hours ending 11/17/20 1758  Filed Weights   11/10/20 0356  Weight: 63.5 kg   Examination:  General exam: Appears calm and comfortable NAD Respiratory system: Clear to auscultation. Respiratory effort normal. Cardiovascular system: normal S1 & S2 heard.  Gastrointestinal system: Abdomen is soft, very tender to light palpation over all 4 quadrants worse on right side Central nervous system: Alert and awake Extremities: No edema Skin: No significant lesions noted Psychiatry: normal affect.  Data Reviewed: I have personally reviewed following labs and imaging studies  CBC: Recent Labs  Lab 11/12/20 0322 11/13/20 0401 11/14/20 0551 11/15/20 0608 11/16/20 0503  WBC 10.9* 6.7 8.1 10.6* 8.5  HGB 12.8 11.0* 13.3 12.5 12.4  HCT 39.7 35.0* 40.4 38.3 37.5  MCV 89.8 90.2 87.6 87.6 87.6  PLT 270 283 336 358 [id number]   Basic Metabolic Panel: Recent Labs  Lab 11/11/20 0510 11/12/20 0322 11/13/20 0401 11/14/20 0551 11/15/20 0608 11/16/20 0503 11/17/20 0506  NA 142 142 144 141 144 139 134*  K 4.1 3.3* 3.3* 4.0 2.9* 2.9* 3.2*  CL 112* 115* 116* 109 113* 108 101  CO2 22 23 21* 18* 21* 23 21*  GLUCOSE 155* 57* 66* 108* 77 60* 262*  BUN 27* '21 20 18 '$ 29* 15 12  CREATININE 1.17* 0.89 0.77 1.19* 1.59* 0.87 1.14*   CALCIUM 9.2 8.5* 8.2* 8.6* 8.8* 8.1* 8.3*  MG 1.9 2.2 2.2 2.0  --  2.0  --   PHOS 3.4  --   --   --   --   --   --    GFR: Estimated Creatinine Clearance: 40.2 mL/min (A) (by C-G formula based on SCr of 1.14 mg/dL (H)). Liver Function Tests: Recent Labs  Lab 11/11/20 0510  AST 13*  ALT 15  ALKPHOS 69  BILITOT 0.5  PROT 6.5  ALBUMIN 3.0*   No results for input(s): LIPASE, AMYLASE in the last 168 hours.  No results for input(s): AMMONIA in the last 168 hours. Coagulation Profile: Recent Labs  Lab 11/11/20 0510  INR 1.3*  Cardiac Enzymes: No results for input(s): CKTOTAL, CKMB, CKMBINDEX, TROPONINI in the last 168 hours. BNP (last 3 results) No results for input(s): PROBNP in the last 8760 hours. HbA1C: No results for input(s): HGBA1C in the last 72 hours. CBG: Recent Labs  Lab 11/16/20 1648 11/16/20 2135 11/17/20 0732 11/17/20 1110 11/17/20 1655  GLUCAP 200* 221* 255* 251* 154*   Lipid Profile: No results for input(s): CHOL, HDL, LDLCALC, TRIG, CHOLHDL, LDLDIRECT in the last 72 hours. Thyroid Function Tests: No results for input(s): TSH, T4TOTAL, FREET4, T3FREE, THYROIDAB in the last 72 hours. Anemia Panel: No results for input(s): VITAMINB12, FOLATE, FERRITIN, TIBC, IRON, RETICCTPCT in the last 72 hours. Sepsis Labs: No results for input(s): PROCALCITON, LATICACIDVEN in the last 168 hours.  Recent Results (from the past 240 hour(s))  Resp Panel by RT-PCR (Flu A&B, Covid) Nasopharyngeal Swab     Status: None   Collection Time: 11/10/20  6:38 AM   Specimen: Nasopharyngeal Swab; Nasopharyngeal(NP) swabs in vial transport medium  Result Value Ref Range Status   SARS Coronavirus 2 by RT PCR NEGATIVE NEGATIVE Final    Comment: (NOTE) SARS-CoV-2 target nucleic acids are NOT DETECTED.  The SARS-CoV-2 RNA is generally detectable in upper respiratory specimens during the acute phase of infection. The lowest concentration of SARS-CoV-2 viral copies this assay can  detect is 138 copies/mL. A negative result does not preclude SARS-Cov-2 infection and should not be used as the sole basis for treatment or other patient management decisions. A negative result may occur with  improper specimen collection/handling, submission of specimen other than nasopharyngeal swab, presence of viral mutation(s) within the areas targeted by this assay, and inadequate number of viral copies(<138 copies/mL). A negative result must be combined with clinical observations, patient history, and epidemiological information. The expected result is Negative.  Fact Sheet for Patients:  EntrepreneurPulse.com.au  Fact Sheet for Healthcare Providers:  IncredibleEmployment.be  This test is no t yet approved or cleared by the [place] FDA and  has been authorized for detection and/or diagnosis of SARS-CoV-2 by FDA under an Emergency Use Authorization (EUA). This EUA will remain  in effect (meaning this test can be used) for the duration of the COVID-19 declaration under Section 564(b)(1) of the Act, 21 U.S.C.section 360bbb-3(b)(1), unless the authorization is terminated  or revoked sooner.       Influenza A by PCR NEGATIVE NEGATIVE Final   Influenza B by PCR NEGATIVE NEGATIVE Final    Comment: (NOTE) The Xpert Xpress SARS-CoV-2/FLU/RSV plus assay is intended as an aid in the diagnosis of influenza from Nasopharyngeal swab specimens and should not be used as a sole basis for treatment. Nasal washings and aspirates are unacceptable for Xpert Xpress SARS-CoV-2/FLU/RSV testing.  Fact Sheet for Patients: EntrepreneurPulse.com.au  Fact Sheet for Healthcare Providers: IncredibleEmployment.be  This test is not yet approved or cleared by the [place] FDA and has been authorized for detection and/or diagnosis of SARS-CoV-2 by FDA under an Emergency Use Authorization (EUA). This EUA will remain in  effect (meaning this test can be used) for the duration of the COVID-19 declaration under Section 564(b)(1) of the Act, 21 U.S.C. section 360bbb-3(b)(1), unless the authorization is terminated or revoked.  Performed at [facility], [address] [place], [place] [address]   Blood culture (routine x 2)     Status: None   Collection Time: 11/10/20  7:18 AM   Specimen: Right Antecubital; Blood  Result Value Ref Range Status   Specimen Description RIGHT ANTECUBITAL  Final   Special Requests   Final    BOTTLES DRAWN AEROBIC AND ANAEROBIC Blood Culture adequate volume   Culture   Final    NO GROWTH 5 DAYS Performed at [facility], [address] [place], [place] [address]    Report Status 11/15/2020 FINAL  Final  Blood culture (routine x 2)     Status: None   Collection Time: 11/10/20  7:19 AM   Specimen: BLOOD RIGHT WRIST  Result Value Ref Range Status   Specimen Description BLOOD RIGHT WRIST  Final   Special Requests   Final    Blood Culture adequate volume BOTTLES DRAWN AEROBIC AND ANAEROBIC   Culture   Final    NO GROWTH 5 DAYS Performed at [facility], [address], [place], [place] [address]    Report Status 11/15/2020 FINAL  Final  MRSA Next Gen by PCR, Nasal     Status: None   Collection Time: 11/10/20  2:42 PM   Specimen: Nasal Mucosa; Nasal Swab  Result Value Ref Range Status   MRSA by PCR Next Gen NOT DETECTED NOT DETECTED Final    Comment: (NOTE) The GeneXpert MRSA Assay (FDA approved for NASAL specimens only), is one component of a comprehensive MRSA colonization surveillance program. It is not intended to diagnose MRSA infection nor to guide or monitor treatment for MRSA infections. Test performance is not FDA approved in patients less than 43 years old. Performed at [facility], [address] [place], [place] [address]   Gastrointestinal Panel by PCR , Stool     Status: None   Collection Time: 11/13/20  3:00 PM   Specimen: Stool  Result Value Ref Range  Status   Campylobacter species NOT DETECTED NOT DETECTED Final   Plesimonas shigelloides NOT DETECTED NOT DETECTED Final   Salmonella species NOT DETECTED NOT DETECTED Final   Yersinia enterocolitica NOT DETECTED NOT DETECTED Final   Vibrio species NOT DETECTED NOT DETECTED Final   Vibrio cholerae NOT DETECTED NOT DETECTED Final   Enteroaggregative E coli (EAEC) NOT DETECTED NOT DETECTED Final   Enteropathogenic E coli (EPEC) NOT DETECTED NOT DETECTED Final   Enterotoxigenic E coli (ETEC) NOT DETECTED NOT DETECTED Final   Shiga like toxin producing E coli (STEC) NOT DETECTED NOT DETECTED Final   Shigella/Enteroinvasive E coli (EIEC) NOT DETECTED NOT DETECTED Final   Cryptosporidium NOT DETECTED NOT DETECTED Final   Cyclospora cayetanensis NOT DETECTED NOT DETECTED Final   Entamoeba histolytica NOT DETECTED NOT DETECTED Final   Giardia lamblia NOT DETECTED NOT DETECTED Final   Adenovirus F40/41 NOT DETECTED NOT DETECTED Final   Astrovirus NOT DETECTED NOT DETECTED Final   Norovirus GI/GII NOT DETECTED NOT DETECTED Final   Rotavirus A NOT DETECTED NOT DETECTED Final   Sapovirus (I, II, IV, and V) NOT DETECTED NOT DETECTED Final    Comment: Performed at [facility], [address] [place], [place] [address]  C Difficile Quick Screen (NO PCR Reflex)     Status: None   Collection Time: 11/13/20  3:00 PM   Specimen: Stool  Result Value Ref Range Status   C Diff antigen NEGATIVE NEGATIVE Final   C Diff toxin NEGATIVE NEGATIVE Final   C Diff interpretation No C. difficile detected.  Final    Comment: Performed at [facility], [address], [place], [place] [address]     Radiology Studies: No results found.  Scheduled Meds:  amLODipine  10 mg Oral Daily   amoxicillin-clavulanate  1  tablet Oral Q12H   aspirin EC  81 mg Oral Daily   atorvastatin  80 mg Oral Daily   Chlorhexidine Gluconate Cloth  6 each Topical Daily   cholecalciferol  1,000 Units Oral Daily   [START ON  11/18/2020] clopidogrel  75 mg Oral Q breakfast   enoxaparin (LOVENOX) injection  40 mg Subcutaneous Q24H   escitalopram  10 mg Oral Daily   [START ON 11/18/2020] feeding supplement  237 mL Oral BID BM   insulin aspart  0-6 Units Subcutaneous TID WC   insulin aspart  3 Units Subcutaneous TID WC   insulin detemir  8 Units Subcutaneous Daily   loratadine  10 mg Oral Daily   rOPINIRole  0.25 mg Oral QHS   saccharomyces boulardii  250 mg Oral BID   Continuous Infusions:  lactated ringers 35 mL/hr at 11/16/20 1528     LOS: 7 days   Time spent: 35 minutes  Letetia Romanello Wynetta Emery, MD How to contact the [facility] Attending or Consulting provider [place] or covering provider during after hours [place], for this patient?  Check the care team in [facility] and look for a) attending/consulting [facility] provider listed and b) the [facility] team listed Log into [email or web address] and use [place]'s universal password to access. If you do not have the password, please contact the hospital operator. Locate the [facility] provider you are looking for under [facility] and page to a number that you can be directly reached. If you still have difficulty reaching the provider, please page the [facility] (Director on Call) for the Hospitalists listed on amion for assistance.   If 7PM-7AM, please contact night-coverage [email or web address] 11/17/2020, 5:58 PM

## 2020-11-17 NOTE — Consult Note (Signed)
Referring Provider: Murlean Iba, MD Primary Care Physician:  Toni Arthurs, [place] Primary Gastroenterologist:  Garfield Cornea, MD??? Date of consultation: November 17, 2020  Reason for Consultation:  persistent severe abdominal pain after treating case of enteritis  HPI: Cheyenne [name] is a 70 y.o. female with PMH significant for type II DM, recent CVAs (last one less than one month ago), HTN, COPD, chronic back pain/spinal stenosis, neuropathy, h/o endometrial cancer presented to the ED November 10, 2020 with 2-day history of constant lower abdominal pain.    Per records, at presentation she was having lower abdominal pain associated with nausea and vomiting.  Pain was described as severe, crampy in nature.  Initially no complaints of fever, diarrhea.  Patient is a poor historian.  Today she states she has been having a lot of diarrhea.  In the ED, presenting with sepsis.  CT findings suggestive of enterocolitis, small to moderate abdominal ascites.  White blood cell count 16,600.  She was started on IV Zosyn.    She failed to progress, persistent poor oral intake.  Complaining of persistent/worsening abdominal pain with vomiting.  Repeat CT abdomen pelvis with contrast September 12.  Previously seen small bowel wall thickening appears improved.  Mild hyperemia of some loops of small bowel in the lower abdomen.  Moderate volume ascites throughout the abdomen and pelvis.  New mild enhancement along the right anterior peritoneum in the lower abdomen/pelvis.  There is also mild enhancement along the posterior margin the pelvic fluid.  No free intraperitoneal air.  Findings consistent with peritoneal irritation/inflammation.  Blood cultures negative x2.  C. difficile antigen and toxin negative.  Admitting white blood cell count of 16,600.  White blood cell count dropped as low as 6700 with slight increase on September 13 of 10,600.  Yesterday back down to 8500.  LFTs  unremarkable.  Completed 7 days of Zosyn.  Documented 2-3 loose stools daily for the past 48 hours.  Switching to Augmentin tonight. Patient difficult historian. She denies chronic diarrhea or chronic abdominal pain. No heartburn, dysphagia. No melena, brbpr.   Colonoscopy 11/2016 [place], [place], done for screening and chronic intermittent abdominal pain and diarrhea with h/o enterocolitis on CT.  Impression:  - One 4 mm polyp at the hepatic flexure, removed with a                    cold snare. Resected and retrieved. (Adenomatous colon polyp with no background IBD.                   - The examined portion of the ileum was normal.                   - Internal hemorrhoids.   CT A/P with IV contrast only 11/2015 Long segment mucosal enhancement and mild wall thickening of distal small bowel/proximal colon with mild surrounding mesenteric infiltration and fluid, likely representing enterocolitis, possibly secondary to ischemia/hypoperfusion or infection among other etiologies. No SMV thrombosis or arterial stenosis. No evidence of bowel obstruction, pneumatosis or perforation.   CT A/P with IV contrast 08/2016 adnexa and tracking of the right paracolic gutter.  --Tubular structure within the right adnexa is grossly unchanged from multiple prior studies probably small hydrosalpinx. If there is concern for pelvic etiology to patient's pain, consider endovaginal ultrasound.  --Mild wall thickening in the rectosigmoid, similar compared with previous examination, probably sequelae of prior radiation therapy, less likely acute colitis  EXAM: CT ABDOMEN PELVIS  W CONTRAST  DATE: 10/20/2017 10:18 AM  ACCESSION: [id number]  DICTATED: 10/20/2017 10:18 AM  INTERPRETATION LOCATION: [facility]   CLINICAL INDICATION: ABDOMINAL PAIN, (specify site in comments) -  Diffuse   COMPARISON: CT abdomen/pelvis dated 06/21/2017, 03/26/2008   TECHNIQUE: A spiral CT scan of the abdomen and pelvis was obtained with  IV contrast from the lung bases through the pubic symphysis. Images were reconstructed in the axial plane. Coronal and sagittal reformatted images were also provided for further evaluation.   FINDINGS:   LOWER THORAX: Minimal subsegmental dependent atelectasis.  7 mm subpleural nodule in the right middle lobe (2:1), partially imaged, unchanged since 03/26/2008.   HEPATOBILIARY: No focal hepatic lesions. The gallbladder is present and otherwise unremarkable. No biliary dilatation.    SPLEEN: Unremarkable.  PANCREAS: Unremarkable.   ADRENALS: Unchanged 1.2 cm right adrenal nodule since 2010. Unchanged thickening of the left adrenal gland.  KIDNEYS/URETERS: Multiple left-sided subcentimeter hypoattenuating lesions, too small to characterize. Kidneys otherwise enhance symmetrically. No hydronephrosis. No urinary tract calculi identified in this contrasted exam.   BLADDER: Distended, otherwise unremarkable.  PELVIC/REPRODUCTIVE ORGANS: Unchanged brachytherapy seeds within the region of the cervix. Unchanged uterine calcification, likely fibroid. The uterus and ovaries are unremarkable.   GI TRACT: There is mild thickening and hyperemia of the of the colon and small bowel in the right abdomen with surrounding free fluid and stranding. The appendix is surgically absent.   PERITONEUM/RETROPERITONEUM AND MESENTERY: Small amount of abdominal and pelvic free fluid.  LYMPH NODES: No enlarged lymph nodes.  VESSELS: The aorta is normal in caliber.  Moderate atherosclerotic calcifications of the abdominal aorta and its branch vessels, which appear normal in caliber. The portal venous system is patent. The hepatic veins and IVC are unremarkable.   BONES AND SOFT TISSUES: Multilevel degenerative disc disease, severe at L4-L5 with marked intervertebral disc space narrowing and subchondral sclerosis. No suspicious osseous lesions.   IMPRESSION:  -- Mild wall thickening and hyperemia of the colon and small bowel  in the right abdomen with surrounding fluid and stranding, likely enterocolitis. No drainable fluid collection or free air identified.  -- Additional chronic and incidental findings as described above.  Prior to Admission medications   Medication Sig Start Date End Date Taking? Authorizing Provider  acetaminophen (TYLENOL) 325 MG tablet Take 650 mg by mouth every 6 (six) hours as needed for mild pain.   Yes [provider]  albuterol (VENTOLIN HFA) 108 (90 Base) MCG/ACT inhaler Inhale 1-2 puffs into the lungs every 6 (six) hours as needed for wheezing or shortness of breath. 01/18/19  Yes Gertie Baron, NP  alendronate (FOSAMAX) 70 MG tablet Take 70 mg by mouth once a week. Wednesday 10/03/20  Yes [provider]  amLODipine (NORVASC) 10 MG tablet Take 1 tablet (10 mg total) by mouth daily. 10/31/20  Yes Regalado, Belkys A, MD  aspirin 81 MG EC tablet Take 1 tablet by mouth daily. 11/02/04  Yes [provider]  atorvastatin (LIPITOR) 40 MG tablet Take 80 mg by mouth daily. 11/26/17  Yes [provider]  chlorthalidone (HYGROTON) 25 MG tablet Take 50 mg by mouth every morning. 07/06/20  Yes [provider]  cholecalciferol (VITAMIN D3) 25 MCG (1000 UNIT) tablet Take 1,000 Units by mouth daily.   Yes [provider]  clonazePAM (KLONOPIN) 0.5 MG tablet Take 0.5 mg by mouth 2 (two) times daily as needed. 10/04/20  Yes [provider]  clopidogrel (PLAVIX) 75 MG tablet Take 1 tablet (75  mg total) by mouth daily with breakfast. 11/01/20  Yes Regalado, Belkys A, MD  escitalopram (LEXAPRO) 10 MG tablet Take 10 mg by mouth daily.   Yes [provider]  glimepiride (AMARYL) 1 MG tablet Take 1 mg by mouth daily with breakfast. 11/05/17  Yes [provider]  glimepiride (AMARYL) 1 MG tablet Take 1 mg by mouth daily. 11/08/20 11/08/21 Yes [provider]  insulin NPH Human (NOVOLIN N) 100 UNIT/ML injection Inject 0.13 mLs (13 Units  total) into the skin 2 (two) times daily at 8 am and 10 pm. Patient taking differently: Inject 25-40 Units into the skin 2 (two) times daily at 8 am and 10 pm. 40u in the morning and 20u at bedtime 10/31/20  Yes Regalado, Belkys A, MD  loratadine (CLARITIN) 10 MG tablet Take 10 mg by mouth daily.   Yes [provider]  losartan (COZAAR) 50 MG tablet Take 50 mg by mouth 2 (two) times daily. 11/26/17  Yes [provider]  nicotine (NICODERM CQ - DOSED IN MG/24 HOURS) 21 mg/24hr patch Place 1 patch (21 mg total) onto the skin daily. 11/01/20  Yes Regalado, Belkys A, MD  potassium chloride SA (KLOR-CON) 20 MEQ tablet Take 20 mEq by mouth 2 (two) times daily. 11/03/20  Yes [provider]  rOPINIRole (REQUIP) 0.25 MG tablet Take 0.25 mg by mouth at bedtime. 10/06/20  Yes [provider]  NOVOLOG FLEXPEN 100 UNIT/ML FlexPen Inject 8 Units into the skin in the morning, at noon, and at bedtime. Patient not taking: No sig reported 08/16/20   [provider]    Current Facility-Administered Medications  Medication Dose Route Frequency Provider Last Rate Last Admin   albuterol (PROVENTIL) (2.5 MG/3ML) 0.083% nebulizer solution 2.5 mg  2.5 mg Nebulization Q6H PRN Manuella Ghazi, Pratik D, DO       amLODipine (NORVASC) tablet 10 mg  10 mg Oral Daily Adefeso, Oladapo, DO   10 mg at 11/17/20 0811   amoxicillin-clavulanate (AUGMENTIN) 875-125 MG per tablet 1 tablet  1 tablet Oral Q12H Johnson, Clanford L, MD       aspirin EC tablet 81 mg  81 mg Oral Daily Adefeso, Oladapo, DO   81 mg at 11/17/20 0811   atorvastatin (LIPITOR) tablet 80 mg  80 mg Oral Daily Adefeso, Oladapo, DO   80 mg at 11/17/20 0811   Chlorhexidine Gluconate Cloth 2 % PADS 6 each  6 each Topical Daily Heath Lark D, DO   6 each at 11/17/20 7622   cholecalciferol (VITAMIN D3) tablet 1,000 Units  1,000 Units Oral Daily Adefeso, Oladapo, DO   1,000 Units at 11/17/20 0811   clonazePAM (KLONOPIN) tablet 0.5 mg  0.5 mg  Oral BID PRN Murlean Iba, MD       [START ON 11/18/2020] clopidogrel (PLAVIX) tablet 75 mg  75 mg Oral Q breakfast Johnson, Clanford L, MD       dextrose 50 % solution 0-50 mL  0-50 mL Intravenous PRN Manuella Ghazi, Pratik D, DO   50 mL at 11/13/20 0445   enoxaparin (LOVENOX) injection 40 mg  40 mg Subcutaneous Q24H Johnson, Clanford L, MD   40 mg at 11/17/20 0811   escitalopram (LEXAPRO) tablet 10 mg  10 mg Oral Daily Manuella Ghazi, Pratik D, DO   10 mg at 11/17/20 6333   haloperidol lactate (HALDOL) injection 2 mg  2 mg Intravenous Q6H PRN Manuella Ghazi, Pratik D, DO       HYDROmorphone (DILAUDID) injection 0.5 mg  0.5 mg Intravenous Q3H PRN Heath Lark D, DO   0.5 mg at 11/14/20 1514   insulin aspart (novoLOG) injection 0-6 Units  0-6 Units Subcutaneous TID WC Johnson, Clanford L, MD   6 Units at 11/17/20 1147   insulin aspart (novoLOG) injection 3 Units  3 Units Subcutaneous TID WC Johnson, Clanford L, MD   3 Units at 11/17/20 1146   insulin detemir (LEVEMIR) injection 8 Units  8 Units Subcutaneous Daily Wynetta Emery, Clanford L, MD   8 Units at 11/17/20 0813   labetalol (NORMODYNE) injection 10 mg  10 mg Intravenous Q2H PRN Heath Lark D, DO   10 mg at 11/13/20 0118   lactated ringers infusion   Intravenous Continuous Johnson, Clanford L, MD 35 mL/hr at 11/16/20 1528 Rate Change at 11/16/20 1528   loratadine (CLARITIN) tablet 10 mg  10 mg Oral Daily Johnson, Clanford L, MD   10 mg at 11/17/20 1008   ondansetron (ZOFRAN) injection 4 mg  4 mg Intravenous Q6H PRN Adefeso, Oladapo, DO       rOPINIRole (REQUIP) tablet 0.25 mg  0.25 mg Oral QHS Adefeso, Oladapo, DO   0.25 mg at 11/16/20 2231   saccharomyces boulardii (FLORASTOR) capsule 250 mg  250 mg Oral BID Heath Lark D, DO   250 mg at 11/17/20 1308    Allergies as of 11/10/2020 - Review Complete 11/10/2020  Allergen Reaction Noted   Gabapentin  01/18/2016   Pregabalin  07/04/2016   Codeine Itching 08/15/2012    Past Medical History:  Diagnosis Date    Diabetes mellitus without complication ([place])    Stroke ([place])    left sided arm weakness    Past Surgical History:  Procedure Laterality Date   BACK SURGERY      History reviewed. No pertinent family history.  Social History   Socioeconomic History   Marital status: Divorced    Spouse name: Not on file   Number of children: Not on file   Years of education: Not on file   Highest education level: Not on file  Occupational History   Not on file  Tobacco Use   Smoking status: Former    Packs/day: 0.50    Types: Cigarettes    Quit date: 10/26/2020    Years since quitting: 0.0   Smokeless tobacco: Never  Vaping Use   Vaping Use: Never used  Substance and Sexual Activity   Alcohol use: Not Currently   Drug use: Never   Sexual activity: Not on file  Other Topics Concern   Not on file  Social History Narrative   Not on file   Social Determinants of Health   Financial Resource Strain: Not on file  Food Insecurity: Not on file  Transportation Needs: Not on file  Physical Activity: Not on file  Stress: Not on file  Social Connections: Not on file  Intimate Partner Violence: Not on file     ROS:  General: Negative for anorexia,   fever, chills, fatigue, +left sided weakness. Per pt maybe "some" weight loss. Hard of hearing. Eyes: Negative for vision changes.  ENT: Negative for hoarseness, difficulty swallowing , nasal congestion. CV: Negative for chest pain, angina, palpitations, dyspnea on exertion, peripheral edema.  Respiratory: Negative for dyspnea at rest, dyspnea on exertion, cough, sputum, wheezing.  GI: See history of present illness. GU:  Negative for dysuria, hematuria, urinary incontinence, urinary frequency, nocturnal urination.  MS: Negative for joint pain, low back pain.  Derm: Negative for rash or itching.  Neuro: Negative for abnormal sensation, seizure, frequent headaches, memory loss, confusion. Left sided weakness Psych: Negative for anxiety,  depression, suicidal ideation, hallucinations.  Endo: Negative for unusual weight change.  Heme: Negative for bruising or bleeding. Allergy: Negative for rash or hives.       Physical Examination: Vital signs in last 24 hours: Temp:  [98.3 F (36.8 C)-99.3 F (37.4 C)] 99.3 F (37.4 C) (09/14 2021) Pulse Rate:  [85-86] 85 (09/14 2021) Resp:  [18-20] 18 (09/14 2021) BP: (128-138)/(59-61) 128/59 (09/14 2021) SpO2:  [94 %-96 %] 94 % (09/14 2021) Last BM Date: 11/17/20  General: Well-nourished, well-developed in no acute distress. Hard of hearing.several episodes of grimacing due to abdominal pain. BM while present, loose nonbloody. Head: Normocephalic, atraumatic.   Eyes: Conjunctiva pink, no icterus. Mouth: Oropharyngeal mucosa moist and pink , no lesions erythema or exudate. Neck: Supple without thyromegaly, masses, or lymphadenopathy.  Lungs: Clear to auscultation bilaterally.  Heart: Regular rate and rhythm, no murmurs rubs or gallops.  Abdomen: Bowel sounds are normal,   nondistended, no hepatosplenomegaly or masses, no abdominal bruits or    hernia , no rebound or guarding.  Diffuse tenderness, increased in rlq. Rectal: not performed Extremities: No lower extremity edema, clubbing, deformity.  Neuro: Alert and oriented x 4 , grossly normal neurologically.  Skin: Warm and dry, no rash or jaundice.   Psych: Alert and cooperative, normal mood and affect.        Intake/Output from previous day: 09/14 0701 - 09/15 0700 In: -  Out: 650 [Urine:650] Intake/Output this shift: No intake/output data recorded.  Lab Results: CBC Recent Labs    11/15/20 0608 11/16/20 0503  WBC 10.6* 8.5  HGB 12.5 12.4  HCT 38.3 37.5  MCV 87.6 87.6  PLT 358 357   BMET Recent Labs    11/15/20 0608 11/16/20 0503 11/17/20 0506  NA 144 139 134*  K 2.9* 2.9* 3.2*  CL 113* 108 101  CO2 21* 23 21*  GLUCOSE 77 60* 262*  BUN 29* 15 12  CREATININE 1.59* 0.87 1.14*  CALCIUM 8.8* 8.1* 8.3*    LFT No results for input(s): BILITOT, BILIDIR, IBILI, ALKPHOS, AST, ALT, PROT, ALBUMIN in the last 72 hours.  Lipase No results for input(s): LIPASE in the last 72 hours.  PT/INR No results for input(s): LABPROT, INR in the last 72 hours.    Imaging Studies: DG Ribs Unilateral W/Chest Left  Result Date: 10/24/2020 CLINICAL DATA:  Fall and left rib pain. EXAM: LEFT RIBS AND CHEST - 3+ VIEW COMPARISON:  Chest radiograph dated 11/04/2012. FINDINGS: There is diffuse chronic interstitial coarsening. No focal consolidation, pleural effusion, or pneumothorax. The cardiac silhouette is within limits. Osteopenia with degenerative changes of the spine. No acute osseous pathology. No displaced rib fractures. IMPRESSION: 1. No acute cardiopulmonary process. 2. No displaced rib fractures. Electronically Signed   By: Anner Crete M.D.   On: 10/24/2020 22:36   DG Lumbar Spine Complete  Result Date: 10/24/2020 CLINICAL DATA:  Fall, left lower rib pain, low back pain EXAM: LUMBAR SPINE - COMPLETE 4+ VIEW COMPARISON:  None. FINDINGS: Advanced degenerative disc disease changes at L4-5 with disc space loss, vacuum disc and spurring. Degenerative facet disease in the lumbar spine. Normal alignment. No fracture. Diffuse osteopenia. IMPRESSION: No acute bony abnormality. Electronically Signed   By: Rolm Baptise M.D.   On: 10/24/2020 22:34   CT HEAD WO CONTRAST (5MM)  Result Date: 10/30/2020 CLINICAL DATA:  Encephalopathy EXAM: CT HEAD WITHOUT  CONTRAST TECHNIQUE: Contiguous axial images were obtained from the base of the skull through the vertex without intravenous contrast. COMPARISON:  Head CT 10/24/2020 and brain MRI 10/25/2020 FINDINGS: Brain: There is no mass, hemorrhage or extra-axial collection. There is generalized atrophy without lobar predilection. Hypodensity of the white matter is most commonly associated with chronic microvascular disease. Focal hypoattenuation at the site of known posterior right  frontal lesion. Vascular: No abnormal hyperdensity of the major intracranial arteries or dural venous sinuses. No intracranial atherosclerosis. Skull: The visualized skull base, calvarium and extracranial soft tissues are normal. Sinuses/Orbits: Moderate sphenoid sinus mucosal thickening. The orbits are normal. IMPRESSION: 1. No acute intracranial abnormality. 2. Focal hypoattenuation at the site of known posterior right frontal lesion, likely subacute infarct. Electronically Signed   By: Ulyses Jarred M.D.   On: 10/30/2020 19:16   CT Head Wo Contrast  Result Date: 10/24/2020 CLINICAL DATA:  Head trauma, minor (Age >= 65y) recent stroke affecting L arm/leg at [facility], now falling EXAM: CT HEAD WITHOUT CONTRAST TECHNIQUE: Contiguous axial images were obtained from the base of the skull through the vertex without intravenous contrast. COMPARISON:  None. FINDINGS: Brain: Patchy low-density throughout the deep white matter, likely chronic small vessel disease. No acute intracranial abnormality. Specifically, no hemorrhage, hydrocephalus, mass lesion, acute infarction, or significant intracranial injury. Vascular: No hyperdense vessel or unexpected calcification. Skull: No acute calvarial abnormality. Sinuses/Orbits: Mucosal thickening throughout the paranasal sinuses, most pronounced in the sphenoid sinuses. Other: None IMPRESSION: Chronic small vessel disease. No acute intracranial abnormality. Chronic sinusitis. Electronically Signed   By: Rolm Baptise M.D.   On: 10/24/2020 22:27   MR ANGIO HEAD WO CONTRAST  Result Date: 10/25/2020 CLINICAL DATA:  Neuro deficit, acute, stroke suspected EXAM: MRI HEAD WITHOUT CONTRAST MRA HEAD WITHOUT CONTRAST TECHNIQUE: Multiplanar, multi-echo pulse sequences of the brain and surrounding structures were acquired without intravenous contrast. Angiographic images of the Circle of Willis were acquired using MRA technique without intravenous contrast. COMPARISON:  No pertinent prior  exam. FINDINGS: MRI HEAD FINDINGS Brain: Approximately 1.3 cm focus of peripheral restricted diffusion in the right posterior frontal white matter, most consistent with acute infarct given the patient's risk factors and other findings of chronic microvascular disease. Additional punctate areas of infarct in the more anterior frontal white matter. Moderate to advanced patchy and T2 hyperintensity in the supratentorial white matter. There are numerous small foci of susceptibility artifact throughout the supratentorial brain, predominantly cortical in location and compatible with prior microhemorrhages. No evidence of acute hemorrhage, mass lesion, hydrocephalus, or extra-axial fluid collection. Vascular: See below. Skull and upper cervical spine: Normal marrow signal. Sinuses/Orbits: Moderate mucosal thickening in the right sphenoid sinus with air-fluid level. Other: No sizable mastoid effusions. MRA HEAD FINDINGS Anterior circulation: Bilateral intracranial ICAs, MCAs, and ACAs are patent without proximal hemodynamically significant stenosis. No aneurysm identified. Posterior circulation: Bilateral intradural vertebral arteries, basilar artery, and posterior cerebral arteries are patent. Moderate mid right and severe distal left P2 PCA stenosis. IMPRESSION: MRI: 1. Approximately 1.3 cm acute infarct in the right frontal white matter with surrounding punctate acute frontal infarcts. 2. Moderate to advanced T2/FLAIR hyperintensities the white matter. 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 3. 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 These hemorrhages are atypical for hypertensive hemorrhages given relative sparing of the basal ganglia, thalami and cerebellum. 4. Moderate mucosal thickening in the  right sphenoid sinus with air-fluid level. MRA: 1. No large vessel occlusion. 2. Motion limited distal evaluation with suspected severe distal left P2 PCA and moderate mid right P2 PCA stenosis. Electronically Signed   By: Margaretha Sheffield M.D.   On: 10/25/2020 12:57   MR BRAIN WO CONTRAST  Result Date: 10/25/2020 CLINICAL DATA:  Neuro deficit, acute, stroke suspected EXAM: MRI HEAD WITHOUT CONTRAST MRA HEAD WITHOUT CONTRAST TECHNIQUE: Multiplanar, multi-echo pulse sequences of the brain and surrounding structures were acquired without intravenous contrast. Angiographic images of the Circle of Willis were acquired using MRA technique without intravenous contrast. COMPARISON:  No pertinent prior exam. FINDINGS: MRI HEAD FINDINGS Brain: Approximately 1.3 cm focus of peripheral restricted diffusion in the right posterior frontal white matter, most consistent with acute infarct given the patient's risk factors and other findings of chronic microvascular disease. Additional punctate areas of infarct in the more anterior frontal white matter. Moderate to advanced patchy and T2 hyperintensity in the supratentorial white matter. There are numerous small foci of susceptibility artifact throughout the supratentorial brain, predominantly cortical in location and compatible with prior microhemorrhages. No evidence of acute hemorrhage, mass lesion, hydrocephalus, or extra-axial fluid collection. Vascular: See below. Skull and upper cervical spine: Normal marrow signal. Sinuses/Orbits: Moderate mucosal thickening in the right sphenoid sinus with air-fluid level. Other: No sizable mastoid effusions. MRA HEAD FINDINGS Anterior circulation: Bilateral intracranial ICAs, MCAs, and ACAs are patent without proximal hemodynamically significant stenosis. No aneurysm identified. Posterior circulation: Bilateral intradural vertebral arteries, basilar artery, and posterior cerebral arteries are patent. Moderate mid right and severe  distal left P2 PCA stenosis. IMPRESSION: MRI: 1. Approximately 1.3 cm acute infarct in the right frontal white matter with surrounding punctate acute frontal infarcts. 2. Moderate to advanced T2/FLAIR hyperintensities the white matter. 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 3. 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 These hemorrhages are atypical for hypertensive hemorrhages given relative sparing of the basal ganglia, thalami and cerebellum. 4. Moderate mucosal thickening in the right sphenoid sinus with air-fluid level. MRA: 1. No large vessel occlusion. 2. Motion limited distal evaluation with suspected severe distal left P2 PCA and moderate mid right P2 PCA stenosis. Electronically Signed   By: Margaretha Sheffield M.D.   On: 10/25/2020 12:57   CT ABDOMEN PELVIS W CONTRAST  Result Date: 11/14/2020 CLINICAL DATA:  Worsening abdominal pain with nausea, vomiting EXAM: CT ABDOMEN AND PELVIS WITH CONTRAST TECHNIQUE: Multidetector CT imaging of the abdomen and pelvis was performed using the standard protocol following bolus administration of intravenous contrast. CONTRAST:  50m OMNIPAQUE IOHEXOL 350 MG/ML SOLN COMPARISON:  CT abdomen/pelvis 11/10/2020, 11/27/2017 FINDINGS: Lower chest: There are bibasilar consolidations, increased since 11/10/2020. There is a 7 mm nodule in the subpleural right middle lobe, present since 2016 and likely benign. Hepatobiliary: The liver and gallbladder are normal. There is no biliary ductal dilatation. Pancreas: Unremarkable. Spleen: Unremarkable. Adrenals/Urinary Tract: A 1.0 cm right adrenal nodule is unchanged. Thickening of the left adrenal is unchanged. Subcentimeter hypodense lesions in the left kidney are too small to characterize, but likely reflect  small cysts. There are no other focal lesions. There are no stones. There is no hydronephrosis or hydroureter. The bladder is unremarkable. Stomach/Bowel: The stomach is unremarkable. There is no evidence of bowel obstruction. Previously seen small bowel wall thickening appears improved. There is mild hyperemia of some loops of small  bowel in the lower abdomen. The bowel is otherwise unremarkable. Vascular/Lymphatic: There is extensive calcified atherosclerotic plaque throughout the nonaneurysmal abdominal aorta. The major branch vessels are patent. The main portal and splenic veins are patent. There is a 7 mm portacaval lymph node, nonspecific and unchanged. There is no new or progressive lymphadenopathy in the abdomen or pelvis. Reproductive: A calcified fundal fibroid is unchanged. The uterus and adnexa are otherwise unremarkable. Other: There is moderate volume ascites throughout the abdomen and pelvis, overall similar in volume to the prior study. There is new mild enhancement along the right anterior peritoneum in the lower abdomen/pelvis (2-65). There is also mild enhancement along the posterior margin of the pelvic fluid (2-71). There is no free intraperitoneal air. Musculoskeletal: There is marked degenerative change at L4-L5. There is no aggressive osseous lesion or acute osseous abnormality. IMPRESSION: 1. Overall similar volume of ascites throughout the abdomen; however, there is new enhancement of the peritoneal lining in the lower abdomen/pelvis consistent with peritoneal irritation/inflammation. No evidence of organized or drainable fluid collection in the abdomen or pelvis. 2. Decreased small bowel wall thickening suggests improving enteritis. 3. Bibasilar consolidations likely reflect atelectasis, though infection can not be entirely excluded. 4. Stable adrenal adenoma and Aortic Atherosclerosis (ICD10-I70.0). Electronically Signed   By: Valetta Mole M.D.   On: 11/14/2020 14:15   CT ABDOMEN PELVIS  W CONTRAST  Result Date: 11/10/2020 CLINICAL DATA:  Lower abdominal pain since yesterday with nausea and vomiting. EXAM: CT ABDOMEN AND PELVIS WITH CONTRAST TECHNIQUE: Multidetector CT imaging of the abdomen and pelvis was performed using the standard protocol following bolus administration of intravenous contrast. CONTRAST:  34m OMNIPAQUE IOHEXOL 350 MG/ML SOLN COMPARISON:  11/27/2017 FINDINGS: Lower chest: No acute pulmonary findings. Subpleural pulmonary nodule in the right middle lobe is likely a benign lymph node and unchanged since 2016. The heart is normal in size. No pericardial effusion. Aortic and coronary artery calcifications are noted. Hepatobiliary: No hepatic lesions or intrahepatic biliary dilatation. The gallbladder is unremarkable. No common bile duct dilatation. Pancreas: No mass, inflammation or ductal dilatation. Spleen: Normal size. No focal lesions. Adrenals/Urinary Tract: Small bilateral adrenal gland nodules are stable and likely benign adenomas. The kidneys are unremarkable. No renal lesions, renal calculi or hydronephrosis. The bladder is grossly normal. Stomach/Bowel: The stomach is unremarkable. The duodenum and proximal small bowel demonstrate mild wall thickening. The colon also demonstrates thickening slight enhancement. No mass lesions or obstructive findings. Suspect enterocolitis. Vascular/Lymphatic: Age advanced atherosclerotic calcifications involving the aorta and iliac arteries. The branch vessels are patent. No dissection. The major venous structures are patent. Reproductive: The uterus is small. A calcified fundal fibroid is noted. No adnexal mass. Other: Small to moderate abdominal ascites. No omental disease or peritoneal surface lesions are identified. This is likely related to the enterocolitis. No peritoneal enhancement to suggest peritonitis. Musculoskeletal: Moderate osteopenia. Postoperative changes involving the lumbar spine. No acute bony findings or worrisome  bone lesions. IMPRESSION: 1. CT findings suggest enterocolitis. 2. Small to moderate abdominal ascites. 3. Stable small bilateral adrenal gland nodules, likely benign adenomas. 4. Age advanced atherosclerotic calcifications involving the aorta and iliac arteries. Aortic Atherosclerosis (ICD10-I70.0). Electronically Signed   By: PMarijo SanesM.D.   On: 11/10/2020 05:39   [place]otid Bilateral  Result Date: 10/28/2020 CLINICAL DATA:  Previous stroke, recent falls, left-sided weakness EXAM: BILATERAL CAROTID DUPLEX ULTRASOUND TECHNIQUE: GPearline Cablesscale imaging, color Doppler and duplex ultrasound were performed of bilateral carotid and vertebral arteries in the neck. COMPARISON:  None. FINDINGS: Criteria: Quantification of carotid stenosis is based on velocity parameters that correlate the residual internal carotid diameter with NASCET-based stenosis levels, using the diameter of the distal internal carotid lumen as the denominator for stenosis measurement. The following velocity measurements were obtained: RIGHT ICA: 113/24 cm/sec CCA: 89/3 cm/sec SYSTOLIC ICA/CCA RATIO:  1.2 ECA: 132 cm/sec LEFT ICA: 98/19 cm/sec CCA: 81/01 cm/sec SYSTOLIC ICA/CCA RATIO:  1.1 ECA: 116 cm/sec RIGHT CAROTID ARTERY: Minor echogenic shadowing plaque formation. No hemodynamically significant right ICA stenosis, velocity elevation, or turbulent flow. Degree of narrowing less than 50%. RIGHT VERTEBRAL ARTERY:  Normal antegrade flow LEFT CAROTID ARTERY: Similar scattered minor echogenic plaque formation. No hemodynamically significant left ICA stenosis, velocity elevation, or turbulent flow. LEFT VERTEBRAL ARTERY:  Normal antegrade flow IMPRESSION: Minor carotid atherosclerosis. Negative for stenosis. Degree of narrowing less than 50% bilaterally by ultrasound criteria. Patent antegrade vertebral flow bilaterally Electronically Signed   By: Jerilynn Mages.  Shick M.D.   On: 10/28/2020 11:38   ECHOCARDIOGRAM COMPLETE  Result Date: 10/25/2020     ECHOCARDIOGRAM REPORT   Patient Name:   Cheyenne [name] Date of Exam: 10/25/2020 Medical Rec #:  [id number]        Height:       64.0 in Accession #:    [id number]       Weight:       136.0 lb Date of Birth:  13-Oct-1950        BSA:          1.661 m Patient Age:    62 years         BP:           145/99 mmHg Patient Gender: F                HR:           82 bpm. Exam Location:  [facility] Procedure: 2D Echo, Cardiac Doppler, Color Doppler and Intracardiac            Opacification Agent Indications:    CVA  History:        Patient has no prior history of Echocardiogram examinations.                 Stroke and COPD; Risk Factors:Diabetes, Current Smoker and                 Hypertension.  Sonographer:    Dustin Flock RDCS Referring Phys: [id number] ASIA B Herscher  Sonographer Comments: Technically difficult study due to poor echo windows. Image acquisition challenging due to COPD. IMPRESSIONS  1. Left ventricular ejection fraction, by estimation, is 60 to 65%. The left ventricle has normal function. The left ventricle has no regional wall motion abnormalities. Left ventricular diastolic parameters are indeterminate.  2. Right ventricular systolic function is normal. The right ventricular size is normal. Tricuspid regurgitation signal is inadequate for assessing PA pressure.  3. The mitral valve is normal in structure. No evidence of mitral valve regurgitation. No evidence of mitral stenosis.  4. The aortic valve was not well visualized. Aortic valve regurgitation is not visualized. No aortic stenosis is present.  5. 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 FINDINGS  Left Ventricle: Left ventricular ejection fraction, by estimation, is 60 to 65%. The left ventricle has normal function. The left ventricle has no regional wall motion abnormalities. Definity contrast agent was given IV to delineate the left ventricular  endocardial borders. The left ventricular  internal cavity size was normal in size. There is no left ventricular hypertrophy. Left ventricular diastolic parameters are indeterminate. Right Ventricle: The right ventricular size is normal. No increase in right ventricular wall thickness. Right ventricular systolic function is normal. Tricuspid regurgitation signal is inadequate for assessing PA pressure. Left Atrium: Left atrial size was normal in size. Right Atrium: Right atrial size was not well visualized. Pericardium: There is no evidence of pericardial effusion. Mitral Valve: The mitral valve is normal in structure. No evidence of mitral valve regurgitation. No evidence of mitral valve stenosis. Tricuspid Valve: The tricuspid valve is not well visualized. Tricuspid valve regurgitation is not demonstrated. No evidence of tricuspid stenosis. Aortic Valve: The aortic valve was not well visualized. Aortic valve regurgitation is not visualized. No aortic stenosis is present. Aortic valve mean gradient measures 1.7 mmHg. Aortic valve peak gradient measures 2.9 mmHg. Aortic valve area, by VTI measures 2.92 cm. Pulmonic Valve: The pulmonic valve was not well visualized. Pulmonic valve regurgitation is not visualized. No evidence of pulmonic stenosis. Aorta: The aortic root is normal in size and structure. 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  LEFT VENTRICLE PLAX 2D LVIDd:         3.57 cm  Diastology LVIDs:         2.19 cm  LV e' medial:    4.12 cm/s LV PW:         0.98 cm  LV E/e' medial:  11.8 LV IVS:        0.96 cm  LV e' lateral:   7.36 cm/s LVOT diam:     2.00 cm  LV E/e' lateral: 6.6 LV SV:         50 LV SV Index:   30 LVOT Area:     3.14 cm  RIGHT VENTRICLE RV Basal diam:  3.20 cm TAPSE (M-mode): 2.8 cm LEFT ATRIUM             Index       RIGHT ATRIUM           Index LA diam:        2.40 cm 1.45 cm/m  RA Area:     10.00 cm LA Vol (A2C):   27.4 ml 16.50 ml/m RA Volume:   20.10 ml  12.10  ml/m LA Vol (A4C):   36.1 ml 21.74 ml/m LA Biplane Vol: 33.4 ml 20.11 ml/m  AORTIC VALVE AV Area (Vmax):    2.99 cm AV Area (Vmean):   2.66 cm AV Area (VTI):     2.92 cm AV Vmax:           84.55 cm/s AV Vmean:          63.507 cm/s AV VTI:            0.170 m AV Peak Grad:      2.9 mmHg AV Mean Grad:      1.7 mmHg LVOT Vmax:         80.50 cm/s LVOT Vmean:        53.800 cm/s LVOT VTI:          0.158 m LVOT/AV VTI ratio: 0.93  AORTA Ao Root diam: 2.60 cm MITRAL VALVE MV Area (PHT): 3.50 cm    SHUNTS MV Decel Time: 217 msec    Systemic VTI:  0.16 m MV E velocity: 48.50 cm/s  Systemic Diam: 2.00 cm MV A velocity: 59.70 cm/s MV E/A ratio:  0.81 NIKE  MD Electronically signed by Carlyle Dolly MD Signature Date/Time: 10/25/2020/12:18:12 PM    Final   Tyson Dense week]   Impression: 70 y/o female with 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 Criteria met for sepsis. Felt to be secondary to enterocolitis based on initial CT A/P.   Abdominal pain: diffuse abdominal pain on exam but worse in RLQ. Crampy in nature. Admitting CT with findings suggestive of enterocolitis. She failed to improved, worsening of abdominal pain with repeat CT 3 days again showing improvement of previously seen small bowel wall thickening.  Mild hyperemia of some loops of small bowel in the lower abdomen.  Moderate volume ascites.  New mild enhancement along the right anterior peritoneum in the lower abdomen/pelvis.  Also mild enhancement along the posterior margin of the pelvic fluid.  Findings consistent with peritoneal irritation/inflammation.review of several CTs prior with similar findings. Colonoscopy in 2018 (for enterocolitis) with normal terminal ileum and no colitis at that time.  She continues with crampy abdominal pain, diarrhea.  Cdiff negative. Antibiotics may be worsening her diarrhea. Her WBC has improved. She was wanting to drink liquids while I was in the room.   Ddx: includes some underlying radiation induced small bowel inflammation with superimposed infectious etiology, less likely malignancy. Cannot rule out mesenteric ischemia although by current CT major branch vessels are patent. Appendix reportedly surgically removed as noted on previously CT reports.    Plan: Supportive measures. Complete course of empiric antibiotics. Low residue/low fiber diet.  Based on clinical course, consider adding antispasmotic such as levsin, bentyl. Reassess in the am.  We would like to thank you for the opportunity to participate in the care of Cheyenne [name].  Laureen Ochs. Bernarda Caffey [facility] [phone] 9/15/20223:42 PM    LOS: 7 days

## 2020-11-18 DIAGNOSIS — R1030 Lower abdominal pain, unspecified: Secondary | ICD-10-CM | POA: Diagnosis not present

## 2020-11-18 DIAGNOSIS — R109 Unspecified abdominal pain: Secondary | ICD-10-CM | POA: Diagnosis not present

## 2020-11-18 DIAGNOSIS — K529 Noninfective gastroenteritis and colitis, unspecified: Secondary | ICD-10-CM

## 2020-11-18 DIAGNOSIS — E43 Unspecified severe protein-calorie malnutrition: Secondary | ICD-10-CM | POA: Insufficient documentation

## 2020-11-18 DIAGNOSIS — N179 Acute kidney failure, unspecified: Secondary | ICD-10-CM | POA: Diagnosis not present

## 2020-11-18 DIAGNOSIS — I639 Cerebral infarction, unspecified: Secondary | ICD-10-CM | POA: Diagnosis not present

## 2020-11-18 LAB — BASIC METABOLIC PANEL
Anion gap: 11 (ref 5–15)
BUN: 14 mg/dL (ref 8–23)
CO2: 24 mmol/L (ref 22–32)
Calcium: 8.4 mg/dL — ABNORMAL LOW (ref 8.9–10.3)
Chloride: 102 mmol/L (ref 98–111)
Creatinine, Ser: 0.79 mg/dL (ref 0.44–1.00)
GFR, Estimated: >60 mL/min (ref >60)
Glucose, Bld: 161 mg/dL — ABNORMAL HIGH (ref 70–99)
Potassium: 2.5 mmol/L — CL (ref 3.5–5.1)
Sodium: 137 mmol/L (ref 135–145)

## 2020-11-18 LAB — CBC
HCT: 39.5 % (ref 36.0–46.0)
Hemoglobin: 13.1 g/dL (ref 12.0–15.0)
MCH: 29.2 pg (ref 26.0–34.0)
MCHC: 33.2 g/dL (ref 30.0–36.0)
MCV: 88.2 fL (ref 80.0–100.0)
Platelets: 411 10*3/uL — ABNORMAL HIGH (ref 150–400)
RBC: 4.48 MIL/uL (ref 3.87–5.11)
RDW: 14.6 % (ref 11.5–15.5)
WBC: 11.6 10*3/uL — ABNORMAL HIGH (ref 4.0–10.5)
nRBC: 0 % (ref 0.0–0.2)

## 2020-11-18 LAB — GLUCOSE, CAPILLARY
Glucose-Capillary: 150 mg/dL — ABNORMAL HIGH (ref 70–99)
Glucose-Capillary: 161 mg/dL — ABNORMAL HIGH (ref 70–99)
Glucose-Capillary: 167 mg/dL — ABNORMAL HIGH (ref 70–99)
Glucose-Capillary: 181 mg/dL — ABNORMAL HIGH (ref 70–99)
Glucose-Capillary: 149 mg/dL — ABNORMAL HIGH (ref 70–99)

## 2020-11-18 LAB — MAGNESIUM: Magnesium: 1.8 mg/dL (ref 1.7–2.4)

## 2020-11-18 MED ORDER — INFLUENZA VAC A&B SA ADJ QUAD 0.5 ML IM PRSY
0.5000 mL | PREFILLED_SYRINGE | INTRAMUSCULAR | Status: AC
  Administered 2020-11-19: 0.5 mL via INTRAMUSCULAR
  Filled 2020-11-18: qty 0.5

## 2020-11-18 MED ORDER — GLUCERNA SHAKE PO LIQD
237.0000 mL | Freq: Three times a day (TID) | ORAL | Status: DC
  Administered 2020-11-18 – 2020-11-22 (×8): 237 mL via ORAL

## 2020-11-18 MED ORDER — ENSURE MAX PROTEIN PO LIQD
11.0000 [oz_av] | Freq: Every day | ORAL | Status: DC
  Administered 2020-11-21 – 2020-11-22 (×2): 11 [oz_av] via ORAL

## 2020-11-18 MED ORDER — MAGNESIUM SULFATE 2 GM/50ML IV SOLN
2.0000 g | Freq: Once | INTRAVENOUS | Status: AC
  Administered 2020-11-18: 2 g via INTRAVENOUS
  Filled 2020-11-18: qty 50

## 2020-11-18 MED ORDER — ADULT MULTIVITAMIN W/MINERALS CH
1.0000 | ORAL_TABLET | Freq: Every day | ORAL | Status: DC
  Administered 2020-11-18 – 2020-11-22 (×5): 1 via ORAL
  Filled 2020-11-18 (×5): qty 1

## 2020-11-18 MED ORDER — POTASSIUM CHLORIDE CRYS ER 20 MEQ PO TBCR
60.0000 meq | EXTENDED_RELEASE_TABLET | Freq: Once | ORAL | Status: AC
  Administered 2020-11-18: 60 meq via ORAL
  Filled 2020-11-18: qty 3

## 2020-11-18 NOTE — Progress Notes (Signed)
Subjective: Feels a little better today. Still with watery diarrhea. Abdominal pain continues but feels it is improving somewhat.  States, "It is nothing to brag about".  Denies nausea or vomiting. Tolerated breakfast well. States she didn't eat much because she wasn't very hungry. Denies history of chronic abdominal pain or trouble with reflux/indigestion prior to admission.  Reports ibuprofen every 4-5 days.   Alert. Seems somewhat confused. Able to tell me her name and date of birth.   Patient had small watery/mucus type bowel movement while I was in the room.  Stool is brown.  Per nursing staff, patient is more alert today.  Reports patient had about 4 bowel movements yesterday that were watery/yellow in color.  No vomiting.  Objective: Vital signs in last 24 hours: Temp:  [98.1 F (36.7 C)-98.6 F (37 C)] 98.6 F (37 C) (09/16 0510) Pulse Rate:  [88-102] 102 (09/16 0510) Resp:  [17-20] 18 (09/16 0510) BP: (129-141)/(61-67) 141/67 (09/16 0510) SpO2:  [95 %-96 %] 96 % (09/16 0510) FiO2 (%):  [21 %] 21 % (09/15 1344) Last BM Date: 11/17/20 General:   Alert and oriented, pleasant, NAD, resting in bed comfortably. Head:  Normocephalic and atraumatic. Eyes:  No icterus, sclera clear. Conjuctiva pink.  Abdomen:  Bowel sounds present. Abdomen nondistended and soft with generalized tenderness, most severe in the RUQ and RLQ. No masses appreciated.  Msk:  Symmetrical without gross deformities. Normal posture. Extremities:  Without edema. Neurologic:  Alert and  oriented to person and knows she is at the hospital, but is unable to tell me exact location.  Does not know current year.   Psych:  Normal mood and affect.  Intake/Output from previous day: No intake/output data recorded. Intake/Output this shift: No intake/output data recorded.  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 Criteria met for sepsis. Felt to be secondary to enterocolitis based on initial CT A/P.   Abdominal pain: Diffuse abdominal pain on exam but worse in RLQ.  Admitting CT with findings suggestive of enterocolitis. She failed to improved, worsening of abdominal pain with repeat CT 9/12 again showing improvement of previously seen small bowel wall thickening.  Mild hyperemia of some loops of small bowel in the lower abdomen.  Moderate volume ascites.  New mild enhancement along the right anterior peritoneum in the lower abdomen/pelvis.  Also mild enhancement along the posterior margin of the pelvic fluid.  Findings consistent with peritoneal irritation/inflammation. Review of several CTs prior (2018/2019) with similar findings. Colonoscopy in 2018 (for enterocolitis) with normal terminal ileum and no colitis at that time. No biopsies were taken of TI. This admission, GI pathogen panel and C. difficile are negative.  She has completed a course of Zosyn and was switched to Augmentin on 9/15.  Patient continues with  abdominal pain, but reports some improvement since admission. Tolerated soft diet this morning. Diarrhea continues, but per documentation in the chart, seems to be decreasing in frequency, possibly influenced by antibiotics. On exam, she has generalized abdominal tenderness, worse in the RLQ and RUQ. WBC increased today to 11.6 from 8.5 yesterday.  Potassium low at 2.5.  Differentials include:  Underlying radiation-induced bowel inflammation with superimposed unidentified infectious etiology, less likely malignancy.  Cannot rule out mesenteric ischemia, but CT this admission with major branch vessels patent.  Less likely IBD with normal-appearing TI on prior colonoscopy, but patient would benefit from repeat colonoscopy with biopsy of TI at some point.  Could also consider further evaluation with CT once over acute illness depending on clinical course. We will continue supportive measures and antibiotics with monitoring for another 24 hours.  May need to consider inpatient colonoscopy if no clinical improvement.   Plan: Continue empiric antibiotics. Continue supportive measures. Continue soft diet. May need to consider inpatient colonoscopy if patient does not continue to improve, otherwise, would likely benefit from outpatient colonoscopy with TI biopsies. Based on clinical course, consider adding antispasmotic such as levsin, bentyl. Correction of hypokalemia per hospitalist. Reassess in the a.m.   LOS: 8 days    11/18/2020, 9:18 AM   Aliene Altes, [facility]

## 2020-11-18 NOTE — Progress Notes (Addendum)
Initial Nutrition Assessment  DOCUMENTATION CODES:  Severe malnutrition in context of chronic illness  INTERVENTION:  Discontinue Ensure BID.  Obtain updated weight.  Add Glucerna Shake po TID, each supplement provides 220 kcal and 10 grams of protein.  Add Ensure Max po daily, each supplement provides 150 kcal and 30 grams of protein.    Add MVI with minerals daily.  Encourage PO and supplement intake.  NUTRITION DIAGNOSIS:  Severe Malnutrition related to chronic illness (COPD) as evidenced by severe fat depletion, severe muscle depletion.  GOAL:  Patient will meet greater than or equal to 90% of their needs  MONITOR:  PO intake, Supplement acceptance, Diet advancement, Labs, Weight trends, Skin, I & O's  REASON FOR ASSESSMENT:  Malnutrition Screening Tool    ASSESSMENT:  70 yo female with a PMH of T2DM, CVA, hypertension, hyperlipidemia, COPD, and tobacco use who presents to the ED due to 2 day onset of constant lower abdominal pain.  She was also noted to have some associated nausea and vomiting and was admitted for enterocolitis based on CT findings. 9/8 - CLD 9/10 - soft diet 9/15 - soft diet (Carb Mod)  Spoke with pt at bedside. Pt reports not eating very well during her hospital stay but was eating fine PTA. She reports her appetite is very poor.  Per Epic, pt eating poorly 0-50% over the last 8 meals, mostly 0-25%.  Pt endorses some weight loss, but is unsure of how much. Per [place], pt's weight has been relatively stable the last two years. It is noted that pt has mild RUE edema present throughout admission.  Pt due for new weight. RD to order.  RD to order Glucerna TID, Ensure Max daily, and MVI with minerals. Discontinue EE BID.  Supplements: EE BID  Medications: reviewed; Augmentin BID, Vitamin D3,SSI, Levemir, LR @ 35 ml/hr  Labs: reviewed; K 2.5 (L), CBG 140-251 (H) HbA1c: 9.1% (10/25/2020)  NUTRITION - FOCUSED PHYSICAL  EXAM: Flowsheet Row Most Recent Value  Orbital Region Severe depletion  Upper Arm Region Moderate depletion  Thoracic and Lumbar Region Mild depletion  Buccal Region Moderate depletion  Temple Region Moderate depletion  Clavicle Bone Region Mild depletion  Clavicle and Acromion Bone Region Mild depletion  Scapular Bone Region No depletion  Dorsal Hand Moderate depletion  Patellar Region Severe depletion  Anterior Thigh Region Severe depletion  Posterior Calf Region Severe depletion  Edema (RD Assessment) Mild  [RUE]  Hair Reviewed  Eyes Reviewed  Mouth Reviewed  Skin Reviewed  Nails Reviewed   Diet Order:   Diet Order             DIET SOFT Room service appropriate? Yes; Fluid consistency: Thin  Diet effective now                  EDUCATION NEEDS:  Education needs have been addressed  Skin:  Skin Assessment: Reviewed RN Assessment (Ecchymosis, petechaie)  Last BM:  11/17/20 - Type 7, medium (c.diff negative)  Height:  Ht Readings from Last 1 Encounters:  11/10/20 '5\' 4"'$  (1.626 m)   Weight:  Wt Readings from Last 1 Encounters:  11/10/20 63.5 kg   BMI:  Body mass index is 24.03 kg/m.  Estimated Nutritional Needs:  Kcal:  1900-2100 Protein:  80-95 grams Fluid:  >1.9 L  Derrel Nip, RD, LDN (she/her/hers) Registered Dietitian I After-Hours/Weekend Pager # in [place]

## 2020-11-18 NOTE — Progress Notes (Signed)
Occupational Therapy Treatment Patient Details Name: Cheyenne [name] MRN: [id number] DOB: May 05, 1950 Today's Date: 11/18/2020   History of present illness Cheyenne [name] is a 70 y.o. female with medical history significant for type II DM, CVA, hypertension, hyperlipidemia, COPD, tobacco use who presents to the emergency department due to 2 day onset of constant lower abdominal pain.  She was also noted to have some associated nausea and vomiting and was admitted for enterocolitis based on CT findings.  She has been started on IV fluid as well as IV Zosyn.  She was noted to have hyperglycemia secondary to type 2 diabetes which has now resolved.   OT comments  Pt pleasant and awake during treatment. Pt demonstrates need for Min A for supine to sit bed mobility and mod to max A for donning socks seated at EOB with more difficulty with donning sock to L LE. Pt able to complete SPT to [facility] from EOB with Min to mod A using RW with assist to place L UE on RW prior to transfer. Pt was able to stand while assisted by this therapist for peri-care after toileting. Pt demonstrates labored movement with assist needed to manage the RW during the pivot portion of the transfer. Pt will benefit from continued OT in the hospital and recommended venue below to increase strength, balance, and endurance for safe ADL's.      Recommendations for follow up therapy are one component of a multi-disciplinary discharge planning process, led by the attending physician.  Recommendations may be updated based on patient status, additional functional criteria and insurance authorization.    Follow Up Recommendations  SNF    Equipment Recommendations  None recommended by OT          Precautions / Restrictions Precautions Precautions: Fall Restrictions Weight Bearing Restrictions: No       Mobility Bed Mobility Overal bed mobility: Needs Assistance Bed Mobility: Supine to Sit;Sit to Supine     Supine to sit: Min  assist Sit to supine: Min assist   General bed mobility comments: labored movement    Transfers Overall transfer level: Needs assistance Equipment used: Rolling walker (2 wheeled) Transfers: Sit to/from [facility] Sit to Stand: Min assist Stand pivot transfers: Min assist;Mod assist       General transfer comment: Assist to manage RW during ambulation.    Balance Overall balance assessment: Needs assistance Sitting-balance support: Feet supported;No upper extremity supported Sitting balance-Leahy Scale: Fair Sitting balance - Comments: fair to good seated at EOB   Standing balance support: During functional activity;No upper extremity supported Standing balance-Leahy Scale: Fair Standing balance comment: fair/poor using RW                           ADL either performed or assessed with clinical judgement   ADL Overall ADL's : Needs assistance/impaired                     Lower Body Dressing: Moderate assistance;Maximal assistance;Sitting/lateral leans Lower Body Dressing Details (indicate cue type and reason): Pt able to pull up sock on R foot once started by this thrapist. Assist needed for L foot. Toilet Transfer: Minimal assistance;Stand-pivot;Moderate assistance;RW Toilet Transfer Details (indicate cue type and reason): EOB to [facility] and back with RW Toileting- Clothing Manipulation and Hygiene: Maximal assistance;Sit to/from stand Toileting - Clothing Manipulation Details (indicate cue type and reason): Pt able to stand while this theraist completed peri-care for pt.  Cognition Arousal/Alertness: Awake/alert Behavior During Therapy: WFL for tasks assessed/performed Overall Cognitive Status: No family/caregiver present to determine baseline cognitive functioning                                                            Pertinent Vitals/ Pain       Pain Assessment:  0-10 Pain Score: 6  Pain Location: Right lower abdomen with movement Pain Descriptors / Indicators: Stabbing Pain Intervention(s): Limited activity within patient's tolerance;Monitored during session;Repositioned                                                          Frequency  Min 2X/week        Progress Toward Goals  OT Goals(current goals can now be found in the care plan section)  Progress towards OT goals: Progressing toward goals  Acute Rehab OT Goals Patient Stated Goal: return home with family to assist OT Goal Formulation: Patient unable to participate in goal setting Time For Goal Achievement: 11/28/20 Potential to Achieve Goals: Fair ADL Goals Pt Will Perform Eating: with modified independence;sitting Pt Will Perform Grooming: with supervision;standing Pt Will Perform Upper Body Bathing: with supervision;sitting Pt Will Perform Lower Body Bathing: with mod assist;sitting/lateral leans;sit to/from stand Pt Will Perform Upper Body Dressing: with supervision;sitting Pt Will Perform Lower Body Dressing: with min assist;sitting/lateral leans;with adaptive equipment;sit to/from stand Pt Will Transfer to Toilet: with min assist;ambulating;bedside commode Pt Will Perform Toileting - Clothing Manipulation and hygiene: with min assist;sitting/lateral leans;sit to/from stand Pt/caregiver will Perform Home Exercise Program: Increased strength;Both right and left upper extremity;With Supervision;With written HEP provided  Plan Discharge plan remains appropriate                                    End of Session Equipment Utilized During Treatment: Rolling walker  OT Visit Diagnosis: Muscle weakness (generalized) (M62.81);History of falling (Z91.81) Hemiplegia - Right/Left: Left Hemiplegia - dominant/non-dominant: Non-Dominant   Activity Tolerance Patient tolerated treatment well   Patient Left in bed;with call bell/phone within  reach;with bed alarm set   Nurse Communication Mobility status        Time: 1041-1106 OT Time Calculation (min): 25 min  Charges: OT General Charges $OT Visit: 1 Visit OT Treatments $Self Care/Home Management : 23-37 mins  Kemiya Batdorf OT, MOT  Larey Seat 11/18/2020, 12:34 PM

## 2020-11-18 NOTE — Progress Notes (Signed)
PROGRESS NOTE    Cheyenne [name]  [id number] DOB: 1950-11-01 DOA: 11/10/2020 PCP: Toni Arthurs, PA   Brief Narrative:   Cheyenne [name] is a 70 y.o. female with medical history significant for type II DM, CVA, hypertension, hyperlipidemia, COPD, tobacco use who presents to the emergency department due to 2 day onset of constant lower abdominal pain.  She was also noted to have some associated nausea and vomiting and was admitted for enterocolitis based on CT findings.  She has been started on IV fluid as well as IV Zosyn.  She was noted to have hyperglycemia secondary to type 2 diabetes which has now resolved.  She appears to be tolerating more of her soft diet slowly, but continues to have significant abdominal pain.  She continues to have some AKI as well as hypokalemia today.  Assessment & Plan:   Principal Problem:   Enterocolitis Active Problems:   Essential hypertension   Cerebrovascular accident (CVA) ([place])   Abdominal pain   Nausea & vomiting   AKI (acute kidney injury) ([place])   Hyperglycemia due to diabetes mellitus (H[facility])   Leukocytosis   Mixed hyperlipidemia   Tobacco use   Asthma   SIRS (systemic inflammatory response syndrome) (H[facility])   Restless leg syndrome   Vitamin D deficiency   Enteritis   Protein-calorie malnutrition, severe  Sepsis present on admission secondary to enterocolitis-slowly improving -Patient patient still not tolerating much overnight -Repeat CT abdomen/pelvis with contrast on 9/12 with findings of focal peritoneal irritation, but enteritis appears to be improving -treated with IV Zosyn and transitioned to oral augmentin.  -Blood cultures with no growth noted thus far -leukocytosis has RESOLVED.   Persistent abdominal pain - IMPROVING - pain seems out of proportion to labs and CT findings - I have asked for GI consultation to assist with eval and diagnosis, management options - continue antibiotics, may need colonoscopy   Prerenal AKI  - RESOLVED  -Continue some IV fluid hydration as patient is not eating well -Baseline creatinine 0.7-0.9 -Reduce IV fluid hydrationl -Hold losartan and chlorthalidone -Monitor strict I's and O's, currently nothing recorded   Hypokalemia -Replete and reevaluate in a.m.  Additional oral replacement ordered.  -Likely related to recent diarrhea with C. difficile negative and GI panel negative   Hyperglycemia secondary to type 2 diabetes-improved Hypoglycemia from insulin  - reduced levemir and SSI coverage, CBG 5x per day  CBG (last 3)  Recent Labs    11/18/20 0749 11/18/20 1138 11/18/20 1630  GLUCAP 161* 167* 181*    Essential hypertension -IV labetalol added for poor BP control -Continue Norvasc -DC chlorthalidone and Cozaar 9/13 due to worsening AKI   History of CVA/dyslipidemia -Lipitor -Continue aspirin   Restless leg syndrome -Requip   History of COPD with ongoing tobacco abuse -Counseled on cessation -Albuterol as needed for shortness of breath or wheezing    DVT prophylaxis: Lovenox Code Status: Full Family Communication: updated patient at bedside, who verbalized understanding Disposition Plan: working on SNF Status is: Inpatient   Remains inpatient appropriate because:IV treatments appropriate due to intensity of illness or inability to take PO and Inpatient level of care appropriate due to severity of illness   Dispo: The patient is from: Home              Anticipated d/c is to: SNF              Patient currently is not medically stable to d/c.  Difficult to place patient No     Consultants:  None   Procedures:  See below  Antimicrobials:  Anti-infectives (From admission, onward)    Start     Dose/Rate Route Frequency Ordered Stop   11/17/20 2200  amoxicillin-clavulanate (AUGMENTIN) 875-125 MG per tablet 1 tablet        1 tablet Oral Every 12 hours 11/17/20 1156 11/22/20 1849   11/10/20 1400  piperacillin-tazobactam (ZOSYN) IVPB  3.375 g  Status:  Discontinued        3.375 g 12.5 mL/hr over 240 Minutes Intravenous Every 8 hours 11/10/20 0732 11/17/20 1156   11/10/20 1200  piperacillin-tazobactam (ZOSYN) IVPB 3.375 g  Status:  Discontinued        3.375 g 100 mL/hr over 30 Minutes Intravenous Every 6 hours 11/10/20 0654 11/10/20 0732   11/10/20 0615  piperacillin-tazobactam (ZOSYN) IVPB 3.375 g        3.375 g 12.5 mL/hr over 240 Minutes Intravenous  Once 11/10/20 0603 11/10/20 1038       Subjective: Patient reports less abdominal pain.  Loose stool persists  Objective: Vitals:   11/17/20 1344 11/17/20 2121 11/18/20 0510 11/18/20 1356  BP: 129/61 139/63 (!) 141/67 (!) 120/53  Pulse: 91 88 (!) 102 (!) 106  Resp: '20 17 18 18  '$ Temp: 98.1 F (36.7 C) 98.6 F (37 C) 98.6 F (37 C) 98.1 F (36.7 C)  TempSrc: Oral  Oral Oral  SpO2: 95% 96% 96% 95%  Weight:      Height:       No intake or output data in the 24 hours ending 11/18/20 1850  Filed Weights   11/10/20 0356  Weight: 63.5 kg   Examination:  General exam: Appears calm and comfortable NAD Respiratory system: Clear to auscultation. Respiratory effort normal. Cardiovascular system: normal S1 & S2 heard.  Gastrointestinal system: Abdomen is less tender to palpation today Central nervous system: Alert and awake Extremities: No edema Skin: No significant lesions noted Psychiatry: normal affect.  Data Reviewed: I have personally reviewed following labs and imaging studies  CBC: Recent Labs  Lab 11/13/20 0401 11/14/20 0551 11/15/20 0608 11/16/20 0503 11/18/20 0531  WBC 6.7 8.1 10.6* 8.5 11.6*  HGB 11.0* 13.3 12.5 12.4 13.1  HCT 35.0* 40.4 38.3 37.5 39.5  MCV 90.2 87.6 87.6 87.6 88.2  PLT 283 336 358 357 [id number]*   Basic Metabolic Panel: Recent Labs  Lab 11/12/20 0322 11/13/20 0401 11/14/20 0551 11/15/20 0608 11/16/20 0503 11/17/20 0506 11/18/20 0531  NA 142 144 141 144 139 134* 137  K 3.3* 3.3* 4.0 2.9* 2.9* 3.2* 2.5*  CL 115*  116* 109 113* 108 101 102  CO2 23 21* 18* 21* 23 21* 24  GLUCOSE 57* 66* 108* 77 60* 262* 161*  BUN '21 20 18 '$ 29* '15 12 14  '$ CREATININE 0.89 0.77 1.19* 1.59* 0.87 1.14* 0.79  CALCIUM 8.5* 8.2* 8.6* 8.8* 8.1* 8.3* 8.4*  MG 2.2 2.2 2.0  --  2.0  --  1.8   GFR: Estimated Creatinine Clearance: 57.3 mL/min (by C-G formula based on SCr of 0.79 mg/dL). Liver Function Tests: No results for input(s): AST, ALT, ALKPHOS, BILITOT, PROT, ALBUMIN in the last 168 hours.  No results for input(s): LIPASE, AMYLASE in the last 168 hours.  No results for input(s): AMMONIA in the last 168 hours. Coagulation Profile: No results for input(s): INR, PROTIME in the last 168 hours.  Cardiac Enzymes: No results for input(s): CKTOTAL, CKMB, CKMBINDEX, TROPONINI in the last  168 hours. BNP (last 3 results) No results for input(s): PROBNP in the last 8760 hours. HbA1C: No results for input(s): HGBA1C in the last 72 hours. CBG: Recent Labs  Lab 11/17/20 2154 11/18/20 0241 11/18/20 0749 11/18/20 1138 11/18/20 1630  GLUCAP 140* 150* 161* 167* 181*   Lipid Profile: No results for input(s): CHOL, HDL, LDLCALC, TRIG, CHOLHDL, LDLDIRECT in the last 72 hours. Thyroid Function Tests: No results for input(s): TSH, T4TOTAL, FREET4, T3FREE, THYROIDAB in the last 72 hours. Anemia Panel: No results for input(s): VITAMINB12, FOLATE, FERRITIN, TIBC, IRON, RETICCTPCT in the last 72 hours. Sepsis Labs: No results for input(s): PROCALCITON, LATICACIDVEN in the last 168 hours.  Recent Results (from the past 240 hour(s))  Resp Panel by RT-PCR (Flu A&B, Covid) Nasopharyngeal Swab     Status: None   Collection Time: 11/10/20  6:38 AM   Specimen: Nasopharyngeal Swab; Nasopharyngeal(NP) swabs in vial transport medium  Result Value Ref Range Status   SARS Coronavirus 2 by RT PCR NEGATIVE NEGATIVE Final    Comment: (NOTE) SARS-CoV-2 target nucleic acids are NOT DETECTED.  The SARS-CoV-2 RNA is generally detectable in upper  respiratory specimens during the acute phase of infection. The lowest concentration of SARS-CoV-2 viral copies this assay can detect is 138 copies/mL. A negative result does not preclude SARS-Cov-2 infection and should not be used as the sole basis for treatment or other patient management decisions. A negative result may occur with  improper specimen collection/handling, submission of specimen other than nasopharyngeal swab, presence of viral mutation(s) within the areas targeted by this assay, and inadequate number of viral copies(<138 copies/mL). A negative result must be combined with clinical observations, patient history, and epidemiological information. The expected result is Negative.  Fact Sheet for Patients:  EntrepreneurPulse.com.au  Fact Sheet for Healthcare Providers:  IncredibleEmployment.be  This test is no t yet approved or cleared by the [place] FDA and  has been authorized for detection and/or diagnosis of SARS-CoV-2 by FDA under an Emergency Use Authorization (EUA). This EUA will remain  in effect (meaning this test can be used) for the duration of the COVID-19 declaration under Section 564(b)(1) of the Act, 21 U.S.C.section 360bbb-3(b)(1), unless the authorization is terminated  or revoked sooner.       Influenza A by PCR NEGATIVE NEGATIVE Final   Influenza B by PCR NEGATIVE NEGATIVE Final    Comment: (NOTE) The Xpert Xpress SARS-CoV-2/FLU/RSV plus assay is intended as an aid in the diagnosis of influenza from Nasopharyngeal swab specimens and should not be used as a sole basis for treatment. Nasal washings and aspirates are unacceptable for Xpert Xpress SARS-CoV-2/FLU/RSV testing.  Fact Sheet for Patients: EntrepreneurPulse.com.au  Fact Sheet for Healthcare Providers: IncredibleEmployment.be  This test is not yet approved or cleared by the [place] FDA and has been  authorized for detection and/or diagnosis of SARS-CoV-2 by FDA under an Emergency Use Authorization (EUA). This EUA will remain in effect (meaning this test can be used) for the duration of the COVID-19 declaration under Section 564(b)(1) of the Act, 21 U.S.C. section 360bbb-3(b)(1), unless the authorization is terminated or revoked.  Performed at [facility], [address], [place], [place] [address]   Blood culture (routine x 2)     Status: None   Collection Time: 11/10/20  7:18 AM   Specimen: Right Antecubital; Blood  Result Value Ref Range Status   Specimen Description RIGHT ANTECUBITAL  Final   Special Requests   Final    BOTTLES DRAWN  AEROBIC AND ANAEROBIC Blood Culture adequate volume   Culture   Final    NO GROWTH 5 DAYS Performed at [facility], [address], [place], [place] [address]    Report Status 11/15/2020 FINAL  Final  Blood culture (routine x 2)     Status: None   Collection Time: 11/10/20  7:19 AM   Specimen: BLOOD RIGHT WRIST  Result Value Ref Range Status   Specimen Description BLOOD RIGHT WRIST  Final   Special Requests   Final    Blood Culture adequate volume BOTTLES DRAWN AEROBIC AND ANAEROBIC   Culture   Final    NO GROWTH 5 DAYS Performed at [facility], [address], [place], [place] [address]    Report Status 11/15/2020 FINAL  Final  MRSA Next Gen by PCR, Nasal     Status: None   Collection Time: 11/10/20  2:42 PM   Specimen: Nasal Mucosa; Nasal Swab  Result Value Ref Range Status   MRSA by PCR Next Gen NOT DETECTED NOT DETECTED Final    Comment: (NOTE) The GeneXpert MRSA Assay (FDA approved for NASAL specimens only), is one component of a comprehensive MRSA colonization surveillance program. It is not intended to diagnose MRSA infection nor to guide or monitor treatment for MRSA infections. Test performance is not FDA approved in patients less than 71 years old. Performed at [facility], [address] [place], [place] [address]    Gastrointestinal Panel by PCR , Stool     Status: None   Collection Time: 11/13/20  3:00 PM   Specimen: Stool  Result Value Ref Range Status   Campylobacter species NOT DETECTED NOT DETECTED Final   Plesimonas shigelloides NOT DETECTED NOT DETECTED Final   Salmonella species NOT DETECTED NOT DETECTED Final   Yersinia enterocolitica NOT DETECTED NOT DETECTED Final   Vibrio species NOT DETECTED NOT DETECTED Final   Vibrio cholerae NOT DETECTED NOT DETECTED Final   Enteroaggregative E coli (EAEC) NOT DETECTED NOT DETECTED Final   Enteropathogenic E coli (EPEC) NOT DETECTED NOT DETECTED Final   Enterotoxigenic E coli (ETEC) NOT DETECTED NOT DETECTED Final   Shiga like toxin producing E coli (STEC) NOT DETECTED NOT DETECTED Final   Shigella/Enteroinvasive E coli (EIEC) NOT DETECTED NOT DETECTED Final   Cryptosporidium NOT DETECTED NOT DETECTED Final   Cyclospora cayetanensis NOT DETECTED NOT DETECTED Final   Entamoeba histolytica NOT DETECTED NOT DETECTED Final   Giardia lamblia NOT DETECTED NOT DETECTED Final   Adenovirus F40/41 NOT DETECTED NOT DETECTED Final   Astrovirus NOT DETECTED NOT DETECTED Final   Norovirus GI/GII NOT DETECTED NOT DETECTED Final   Rotavirus A NOT DETECTED NOT DETECTED Final   Sapovirus (I, II, IV, and V) NOT DETECTED NOT DETECTED Final    Comment: Performed at [facility], [address] [place], [place] [address]  C Difficile Quick Screen (NO PCR Reflex)     Status: None   Collection Time: 11/13/20  3:00 PM   Specimen: Stool  Result Value Ref Range Status   C Diff antigen NEGATIVE NEGATIVE Final   C Diff toxin NEGATIVE NEGATIVE Final   C Diff interpretation No C. difficile detected.  Final    Comment: Performed at [facility], [address] [place], [place] [address]     Radiology Studies: No results found.  Scheduled Meds:  amLODipine  10 mg Oral Daily   amoxicillin-clavulanate  1 tablet Oral Q12H   aspirin EC  81 mg Oral Daily  atorvastatin  80 mg Oral Daily   Chlorhexidine Gluconate Cloth  6 each Topical Daily   cholecalciferol  1,000 Units Oral Daily   clopidogrel  75 mg Oral Q breakfast   enoxaparin (LOVENOX) injection  40 mg Subcutaneous Q24H   escitalopram  10 mg Oral Daily   feeding supplement (GLUCERNA SHAKE)  237 mL Oral TID BM   [START ON 11/19/2020] influenza vaccine adjuvanted  0.5 mL Intramuscular Tomorrow-1000   insulin aspart  0-6 Units Subcutaneous TID WC   insulin aspart  3 Units Subcutaneous TID WC   insulin detemir  8 Units Subcutaneous Daily   loratadine  10 mg Oral Daily   multivitamin with minerals  1 tablet Oral Daily   Ensure Max Protein  11 oz Oral Daily   rOPINIRole  0.25 mg Oral QHS   saccharomyces boulardii  250 mg Oral BID   Continuous Infusions:  lactated ringers 35 mL/hr at 11/16/20 1528   magnesium sulfate bolus IVPB       LOS: 8 days   Time spent: 35 minutes  Amilio Zehnder Wynetta Emery, MD How to contact the [facility] Attending or Consulting provider [place] or covering provider during after hours [place], for this patient?  Check the care team in [facility] and look for a) attending/consulting [facility] provider listed and b) the [facility] team listed Log into [email or web address] and use [place]'s universal password to access. If you do not have the password, please contact the hospital operator. Locate the [facility] provider you are looking for under [facility] and page to a number that you can be directly reached. If you still have difficulty reaching the provider, please page the [facility] (Director on Call) for the Hospitalists listed on amion for assistance.   If 7PM-7AM, please contact night-coverage [email or web address] 11/18/2020, 6:50 PM

## 2020-11-18 NOTE — Progress Notes (Addendum)
Physical Therapy Treatment Patient Details Name: Cheyenne [name] MRN: [id number] DOB: [birth year] Today's Date: 11/18/2020   History of Present Illness Cheyenne [name] is a 70 y.o. female with medical history significant for type II DM, CVA, hypertension, hyperlipidemia, COPD, tobacco use who presents to the emergency department due to 2 day onset of constant lower abdominal pain.  She was also noted to have some associated nausea and vomiting and was admitted for enterocolitis based on CT findings.  She has been started on IV fluid as well as IV Zosyn.  She was noted to have hyperglycemia secondary to type 2 diabetes which has now resolved.    PT Comments    Patient stating fatigue from earlier today limiting ability to ambulate but is agreeable to seated exercises. Patient completes seated exercises with good sitting balance. She requires intermittent cueing to continue completing exercises along with demonstration. Patient will benefit from continued physical therapy in hospital and recommended venue below to increase strength, balance, endurance for safe ADLs and gait.    Recommendations for follow up therapy are one component of a multi-disciplinary discharge planning process, led by the attending physician.  Recommendations may be updated based on patient status, additional functional criteria and insurance authorization.  Follow Up Recommendations  SNF;Supervision for mobility/OOB;Supervision - Intermittent     Equipment Recommendations  None recommended by PT    Recommendations for Other Services       Precautions / Restrictions Precautions Precautions: Fall Restrictions Weight Bearing Restrictions: No     Mobility  Bed Mobility   Ambulation/Gait                 Stairs             Wheelchair Mobility    Modified Rankin (Stroke Patients Only)       Balance Overall balance assessment: Needs assistance Sitting-balance support: Feet supported;No  upper extremity supported Sitting balance-Leahy Scale: Fair Sitting balance - Comments: fair to good seated at EOB   Standing balance support: During functional activity;No upper extremity supported Standing balance-Leahy Scale: Fair Standing balance comment: fair/poor using RW                            Cognition Arousal/Alertness: Awake/alert Behavior During Therapy: WFL for tasks assessed/performed Overall Cognitive Status: No family/caregiver present to determine baseline cognitive functioning                                        Exercises General Exercises - Lower Extremity Long Arc Quad: AROM;Both;20 reps;Seated Hip Flexion/Marching: AROM;Both;20 reps;Seated Toe Raises: AROM;Both;20 reps;Seated Heel Raises: AROM;Both;20 reps;Seated    General Comments        Pertinent Vitals/Pain Pain Assessment: No/denies pain Pain Score: 6  Pain Location: Right lower abdomen with movement Pain Descriptors / Indicators: Stabbing Pain Intervention(s): Limited activity within patient's tolerance;Monitored during session;Repositioned    Home Living                      Prior Function            PT Goals (current goals can now be found in the care plan section) Acute Rehab PT Goals Patient Stated Goal: return home with family to assist PT Goal Formulation: With patient Time For Goal Achievement: 11/28/20 Potential to Achieve Goals: Good Progress towards PT goals:  Progressing toward goals    Frequency    Min 3X/week      PT Plan Current plan remains appropriate    Co-evaluation              AM-PAC PT "6 Clicks" Mobility   Outcome Measure  Help needed turning from your back to your side while in a flat bed without using bedrails?: A Little Help needed moving from lying on your back to sitting on the side of a flat bed without using bedrails?: A Lot Help needed moving to and from a bed to a chair (including a wheelchair)?: A  Little Help needed standing up from a chair using your arms (e.g., wheelchair or bedside chair)?: A Little Help needed to walk in hospital room?: A Lot Help needed climbing 3-5 steps with a railing? : A Lot 6 Click Score: 15    End of Session   Activity Tolerance: Patient tolerated treatment well Patient left: in chair;with call bell/phone within reach Nurse Communication: Mobility status PT Visit Diagnosis: Unsteadiness on feet (R26.81);Other abnormalities of gait and mobility (R26.89);Muscle weakness (generalized) (M62.81)     Time: [id number] PT Time Calculation (min) (ACUTE ONLY): 8 min  Charges:  $Therapeutic Exercise: 8-22 mins                     2:28 PM, 11/18/20 Mearl Latin PT, DPT Physical Therapist at [facility]

## 2020-11-18 NOTE — Care Management Important Message (Signed)
Important Message  Patient Details  Name: Cheyenne [name] MRN: [id number] Date of Birth: 01-Aug-1950   Medicare Important Message Given:  Yes     Tommy Medal 11/18/2020, 12:08 PM

## 2020-11-18 NOTE — Progress Notes (Signed)
Date and time results received: 11/18/20 0718 (use smartphrase ".now" to insert current time)  Test: K+ Critical Value: 2.5  Name of Provider Notified: Wynetta Emery  Orders Received? Or Actions Taken?: awaiting MD Alwyn Pea

## 2020-11-19 DIAGNOSIS — N179 Acute kidney failure, unspecified: Secondary | ICD-10-CM | POA: Diagnosis not present

## 2020-11-19 DIAGNOSIS — I10 Essential (primary) hypertension: Secondary | ICD-10-CM | POA: Diagnosis not present

## 2020-11-19 DIAGNOSIS — K529 Noninfective gastroenteritis and colitis, unspecified: Secondary | ICD-10-CM | POA: Diagnosis not present

## 2020-11-19 DIAGNOSIS — R109 Unspecified abdominal pain: Secondary | ICD-10-CM | POA: Diagnosis not present

## 2020-11-19 LAB — HEPATIC FUNCTION PANEL
ALT: 15 U/L (ref 0–44)
AST: 16 U/L (ref 15–41)
Albumin: 2.1 g/dL — ABNORMAL LOW (ref 3.5–5.0)
Alkaline Phosphatase: 54 U/L (ref 38–126)
Bilirubin, Direct: 0.1 mg/dL (ref 0.0–0.2)
Indirect Bilirubin: 0.4 mg/dL (ref 0.3–0.9)
Total Bilirubin: 0.5 mg/dL (ref 0.3–1.2)
Total Protein: 5.7 g/dL — ABNORMAL LOW (ref 6.5–8.1)

## 2020-11-19 LAB — CBC WITH DIFFERENTIAL/PLATELET
Abs Immature Granulocytes: 0.07 10*3/uL (ref 0.00–0.07)
Basophils Absolute: 0 10*3/uL (ref 0.0–0.1)
Basophils Relative: 0 %
Eosinophils Absolute: 0 10*3/uL (ref 0.0–0.5)
Eosinophils Relative: 0 %
HCT: 34.4 % — ABNORMAL LOW (ref 36.0–46.0)
Hemoglobin: 11.4 g/dL — ABNORMAL LOW (ref 12.0–15.0)
Immature Granulocytes: 1 %
Lymphocytes Relative: 7 %
Lymphs Abs: 0.6 10*3/uL — ABNORMAL LOW (ref 0.7–4.0)
MCH: 29.2 pg (ref 26.0–34.0)
MCHC: 33.1 g/dL (ref 30.0–36.0)
MCV: 88 fL (ref 80.0–100.0)
Monocytes Absolute: 0.7 10*3/uL (ref 0.1–1.0)
Monocytes Relative: 8 %
Neutro Abs: 7.6 10*3/uL (ref 1.7–7.7)
Neutrophils Relative %: 84 %
Platelets: 357 10*3/uL (ref 150–400)
RBC: 3.91 MIL/uL (ref 3.87–5.11)
RDW: 14.5 % (ref 11.5–15.5)
WBC: 9 10*3/uL (ref 4.0–10.5)
nRBC: 0 % (ref 0.0–0.2)

## 2020-11-19 LAB — GLUCOSE, CAPILLARY
Glucose-Capillary: 152 mg/dL — ABNORMAL HIGH (ref 70–99)
Glucose-Capillary: 147 mg/dL — ABNORMAL HIGH (ref 70–99)
Glucose-Capillary: 156 mg/dL — ABNORMAL HIGH (ref 70–99)
Glucose-Capillary: 113 mg/dL — ABNORMAL HIGH (ref 70–99)
Glucose-Capillary: 243 mg/dL — ABNORMAL HIGH (ref 70–99)

## 2020-11-19 LAB — BASIC METABOLIC PANEL
Anion gap: 10 (ref 5–15)
BUN: 11 mg/dL (ref 8–23)
CO2: 22 mmol/L (ref 22–32)
Calcium: 8.1 mg/dL — ABNORMAL LOW (ref 8.9–10.3)
Chloride: 102 mmol/L (ref 98–111)
Creatinine, Ser: 0.69 mg/dL (ref 0.44–1.00)
GFR, Estimated: >60 mL/min (ref >60)
Glucose, Bld: 138 mg/dL — ABNORMAL HIGH (ref 70–99)
Potassium: 2.7 mmol/L — CL (ref 3.5–5.1)
Sodium: 134 mmol/L — ABNORMAL LOW (ref 135–145)

## 2020-11-19 LAB — MAGNESIUM: Magnesium: 2.3 mg/dL (ref 1.7–2.4)

## 2020-11-19 MED ORDER — POTASSIUM CHLORIDE CRYS ER 20 MEQ PO TBCR
60.0000 meq | EXTENDED_RELEASE_TABLET | Freq: Once | ORAL | Status: AC
  Administered 2020-11-19: 60 meq via ORAL
  Filled 2020-11-19: qty 3

## 2020-11-19 MED ORDER — INSULIN ASPART 100 UNIT/ML IJ SOLN
4.0000 [IU] | Freq: Once | INTRAMUSCULAR | Status: AC
  Administered 2020-11-19: 4 [IU] via SUBCUTANEOUS

## 2020-11-19 MED ORDER — POTASSIUM CHLORIDE CRYS ER 20 MEQ PO TBCR
40.0000 meq | EXTENDED_RELEASE_TABLET | Freq: Once | ORAL | Status: AC
  Administered 2020-11-19: 40 meq via ORAL
  Filled 2020-11-19: qty 2

## 2020-11-19 NOTE — Progress Notes (Signed)
PROGRESS NOTE    Cheyenne [name]  [id number] DOB: 10-23-1950 DOA: 11/10/2020 PCP: Toni Arthurs, PA   Brief Narrative:   Cheyenne [name] is a 70 y.o. female with medical history significant for type II DM, CVA, hypertension, hyperlipidemia, COPD, tobacco use who presents to the emergency department due to 2 day onset of constant lower abdominal pain.  She was also noted to have some associated nausea and vomiting and was admitted for enterocolitis based on CT findings.  She has been started on IV fluid as well as IV Zosyn.  She was noted to have hyperglycemia secondary to type 2 diabetes which has now resolved.  She appears to be tolerating more of her soft diet slowly, but continues to have significant abdominal pain.  She continues to have some AKI as well as hypokalemia today.  Assessment & Plan:   Principal Problem:   Enterocolitis Active Problems:   Essential hypertension   Cerebrovascular accident (CVA) ([place])   Abdominal pain   Nausea & vomiting   AKI (acute kidney injury) ([place])   Hyperglycemia due to diabetes mellitus (H[facility])   Leukocytosis   Mixed hyperlipidemia   Tobacco use   Asthma   SIRS (systemic inflammatory response syndrome) ([facility])   Restless leg syndrome   Vitamin D deficiency   Enteritis   Protein-calorie malnutrition, severe  Sepsis present on admission secondary to enterocolitis-RESOLVED -Patient patient still not tolerating much overnight -Repeat CT abdomen/pelvis with contrast on 9/12 with findings of focal peritoneal irritation, but enteritis appears to be improving -treated with IV Zosyn and transitioned to oral augmentin.  -Blood cultures with no growth -leukocytosis has RESOLVED.   Persistent abdominal pain - IMPROVING to nearly resolved  - pain seems out of proportion to labs and CT findings - I have asked for GI consultation to assist with eval and diagnosis, management options - continue antibiotics, GI planning outpatient colonoscopy that  they will arrange   Prerenal AKI - RESOLVED  -Continue some IV fluid hydration as patient is not eating well -Baseline creatinine 0.7-0.9 -Reduce IV fluid hydrationl -Hold losartan and chlorthalidone -Monitor strict I's and O's, currently nothing recorded   Hypokalemia -Replete and reevaluate in a.m.  Additional oral replacement ordered.  -Likely related to recent diarrhea with C. difficile negative and GI panel negative   Hyperglycemia secondary to type 2 diabetes-improved Hypoglycemia from insulin  - reduced levemir and SSI coverage, CBG 5x per day  CBG (last 3)  Recent Labs    11/19/20 0719 11/19/20 1118 11/19/20 1600  GLUCAP 147* 156* 113*    Essential hypertension -IV labetalol added for poor BP control -Continue Norvasc -DC chlorthalidone and Cozaar 9/13 due to worsening AKI   History of CVA/dyslipidemia -Lipitor -Continue aspirin   Restless leg syndrome -Requip   History of COPD with ongoing tobacco abuse -Counseled on cessation -Albuterol as needed for shortness of breath or wheezing    DVT prophylaxis: Lovenox Code Status: Full Family Communication: updated patient at bedside, who verbalized understanding Disposition Plan: working on SNF Status is: Inpatient   Remains inpatient appropriate because:IV treatments appropriate due to intensity of illness or inability to take PO and Inpatient level of care appropriate due to severity of illness   Dispo: The patient is from: Home              Anticipated d/c is to: SNF              Patient currently is medically stable to d/c.  Difficult to place patient No     Consultants:  None   Procedures:  See below  Antimicrobials:  Anti-infectives (From admission, onward)    Start     Dose/Rate Route Frequency Ordered Stop   11/17/20 2200  amoxicillin-clavulanate (AUGMENTIN) 875-125 MG per tablet 1 tablet        1 tablet Oral Every 12 hours 11/17/20 1156 11/21/20 0959   11/10/20 1400   piperacillin-tazobactam (ZOSYN) IVPB 3.375 g  Status:  Discontinued        3.375 g 12.5 mL/hr over 240 Minutes Intravenous Every 8 hours 11/10/20 0732 11/17/20 1156   11/10/20 1200  piperacillin-tazobactam (ZOSYN) IVPB 3.375 g  Status:  Discontinued        3.375 g 100 mL/hr over 30 Minutes Intravenous Every 6 hours 11/10/20 0654 11/10/20 0732   11/10/20 0615  piperacillin-tazobactam (ZOSYN) IVPB 3.375 g        3.375 g 12.5 mL/hr over 240 Minutes Intravenous  Once 11/10/20 0603 11/10/20 1038       Subjective: Pt reports that the abdominal pain continues to improve.    Objective: Vitals:   11/18/20 2004 11/19/20 0319 11/19/20 0641 11/19/20 1403  BP: (!) 128/57 (!) 143/66  (!) 120/55  Pulse: 96 90  87  Resp: (!) 22 19    Temp: 99.5 F (37.5 C) 97.6 F (36.4 C)  99 F (37.2 C)  TempSrc: Oral Oral  Oral  SpO2: 95% 91%  95%  Weight:   65.1 kg   Height:   '5\' 4"'$  (1.626 m)     Intake/Output Summary (Last 24 hours) at 11/19/2020 1619 Last data filed at 11/19/2020 1300 Gross per 24 hour  Intake 120 ml  Output --  Net 120 ml    Filed Weights   11/10/20 0356 11/19/20 0641  Weight: 63.5 kg 65.1 kg   Examination:  General exam: Appears calm and comfortable NAD Respiratory system: Clear to auscultation. Respiratory effort normal. Cardiovascular system: normal S1 & S2 heard.  Gastrointestinal system: Abdominal pain is nearly resolved.  Central nervous system: Alert and awake Extremities: No edema Skin: No significant lesions noted Psychiatry: normal affect.  Data Reviewed: I have personally reviewed following labs and imaging studies  CBC: Recent Labs  Lab 11/14/20 0551 11/15/20 0608 11/16/20 0503 11/18/20 0531 11/19/20 0447  WBC 8.1 10.6* 8.5 11.6* 9.0  NEUTROABS  --   --   --   --  7.6  HGB 13.3 12.5 12.4 13.1 11.4*  HCT 40.4 38.3 37.5 39.5 34.4*  MCV 87.6 87.6 87.6 88.2 88.0  PLT 336 358 357 411* [id number]   Basic Metabolic Panel: Recent Labs  Lab 11/13/20 0401  11/14/20 0551 11/15/20 0608 11/16/20 0503 11/17/20 0506 11/18/20 0531 11/19/20 0447  NA 144 141 144 139 134* 137 134*  K 3.3* 4.0 2.9* 2.9* 3.2* 2.5* 2.7*  CL 116* 109 113* 108 101 102 102  CO2 21* 18* 21* 23 21* 24 22  GLUCOSE 66* 108* 77 60* 262* 161* 138*  BUN 20 18 29* '15 12 14 11  '$ CREATININE 0.77 1.19* 1.59* 0.87 1.14* 0.79 0.69  CALCIUM 8.2* 8.6* 8.8* 8.1* 8.3* 8.4* 8.1*  MG 2.2 2.0  --  2.0  --  1.8 2.3   GFR: Estimated Creatinine Clearance: 57.3 mL/min (by C-G formula based on SCr of 0.69 mg/dL). Liver Function Tests: Recent Labs  Lab 11/19/20 0447  AST 16  ALT 15  ALKPHOS 54  BILITOT 0.5  PROT 5.7*  ALBUMIN 2.1*  No results for input(s): LIPASE, AMYLASE in the last 168 hours.  No results for input(s): AMMONIA in the last 168 hours. Coagulation Profile: No results for input(s): INR, PROTIME in the last 168 hours.  Cardiac Enzymes: No results for input(s): CKTOTAL, CKMB, CKMBINDEX, TROPONINI in the last 168 hours. BNP (last 3 results) No results for input(s): PROBNP in the last 8760 hours. HbA1C: No results for input(s): HGBA1C in the last 72 hours. CBG: Recent Labs  Lab 11/18/20 2134 11/19/20 0320 11/19/20 0719 11/19/20 1118 11/19/20 1600  GLUCAP 149* 152* 147* 156* 113*   Lipid Profile: No results for input(s): CHOL, HDL, LDLCALC, TRIG, CHOLHDL, LDLDIRECT in the last 72 hours. Thyroid Function Tests: No results for input(s): TSH, T4TOTAL, FREET4, T3FREE, THYROIDAB in the last 72 hours. Anemia Panel: No results for input(s): VITAMINB12, FOLATE, FERRITIN, TIBC, IRON, RETICCTPCT in the last 72 hours. Sepsis Labs: No results for input(s): PROCALCITON, LATICACIDVEN in the last 168 hours.  Recent Results (from the past 240 hour(s))  Resp Panel by RT-PCR (Flu A&B, Covid) Nasopharyngeal Swab     Status: None   Collection Time: 11/10/20  6:38 AM   Specimen: Nasopharyngeal Swab; Nasopharyngeal(NP) swabs in vial transport medium  Result Value Ref  Range Status   SARS Coronavirus 2 by RT PCR NEGATIVE NEGATIVE Final    Comment: (NOTE) SARS-CoV-2 target nucleic acids are NOT DETECTED.  The SARS-CoV-2 RNA is generally detectable in upper respiratory specimens during the acute phase of infection. The lowest concentration of SARS-CoV-2 viral copies this assay can detect is 138 copies/mL. A negative result does not preclude SARS-Cov-2 infection and should not be used as the sole basis for treatment or other patient management decisions. A negative result may occur with  improper specimen collection/handling, submission of specimen other than nasopharyngeal swab, presence of viral mutation(s) within the areas targeted by this assay, and inadequate number of viral copies(<138 copies/mL). A negative result must be combined with clinical observations, patient history, and epidemiological information. The expected result is Negative.  Fact Sheet for Patients:  EntrepreneurPulse.com.au  Fact Sheet for Healthcare Providers:  IncredibleEmployment.be  This test is no t yet approved or cleared by the [place] FDA and  has been authorized for detection and/or diagnosis of SARS-CoV-2 by FDA under an Emergency Use Authorization (EUA). This EUA will remain  in effect (meaning this test can be used) for the duration of the COVID-19 declaration under Section 564(b)(1) of the Act, 21 U.S.C.section 360bbb-3(b)(1), unless the authorization is terminated  or revoked sooner.       Influenza A by PCR NEGATIVE NEGATIVE Final   Influenza B by PCR NEGATIVE NEGATIVE Final    Comment: (NOTE) The Xpert Xpress SARS-CoV-2/FLU/RSV plus assay is intended as an aid in the diagnosis of influenza from Nasopharyngeal swab specimens and should not be used as a sole basis for treatment. Nasal washings and aspirates are unacceptable for Xpert Xpress SARS-CoV-2/FLU/RSV testing.  Fact Sheet for  Patients: EntrepreneurPulse.com.au  Fact Sheet for Healthcare Providers: IncredibleEmployment.be  This test is not yet approved or cleared by the [place] FDA and has been authorized for detection and/or diagnosis of SARS-CoV-2 by FDA under an Emergency Use Authorization (EUA). This EUA will remain in effect (meaning this test can be used) for the duration of the COVID-19 declaration under Section 564(b)(1) of the Act, 21 U.S.C. section 360bbb-3(b)(1), unless the authorization is terminated or revoked.  Performed at [facility], [address], [place], [place] [address]   Blood culture (  routine x 2)     Status: None   Collection Time: 11/10/20  7:18 AM   Specimen: Right Antecubital; Blood  Result Value Ref Range Status   Specimen Description RIGHT ANTECUBITAL  Final   Special Requests   Final    BOTTLES DRAWN AEROBIC AND ANAEROBIC Blood Culture adequate volume   Culture   Final    NO GROWTH 5 DAYS Performed at [facility], [address], [place], [place] [address]    Report Status 11/15/2020 FINAL  Final  Blood culture (routine x 2)     Status: None   Collection Time: 11/10/20  7:19 AM   Specimen: BLOOD RIGHT WRIST  Result Value Ref Range Status   Specimen Description BLOOD RIGHT WRIST  Final   Special Requests   Final    Blood Culture adequate volume BOTTLES DRAWN AEROBIC AND ANAEROBIC   Culture   Final    NO GROWTH 5 DAYS Performed at [facility], [address], [place], [place] [address]    Report Status 11/15/2020 FINAL  Final  MRSA Next Gen by PCR, Nasal     Status: None   Collection Time: 11/10/20  2:42 PM   Specimen: Nasal Mucosa; Nasal Swab  Result Value Ref Range Status   MRSA by PCR Next Gen NOT DETECTED NOT DETECTED Final    Comment: (NOTE) The GeneXpert MRSA Assay (FDA approved for NASAL specimens only), is one component of a comprehensive MRSA colonization surveillance program. It is not intended to diagnose MRSA  infection nor to guide or monitor treatment for MRSA infections. Test performance is not FDA approved in patients less than 54 years old. Performed at [facility], [address], [place], [place] [address]   Gastrointestinal Panel by PCR , Stool     Status: None   Collection Time: 11/13/20  3:00 PM   Specimen: Stool  Result Value Ref Range Status   Campylobacter species NOT DETECTED NOT DETECTED Final   Plesimonas shigelloides NOT DETECTED NOT DETECTED Final   Salmonella species NOT DETECTED NOT DETECTED Final   Yersinia enterocolitica NOT DETECTED NOT DETECTED Final   Vibrio species NOT DETECTED NOT DETECTED Final   Vibrio cholerae NOT DETECTED NOT DETECTED Final   Enteroaggregative E coli (EAEC) NOT DETECTED NOT DETECTED Final   Enteropathogenic E coli (EPEC) NOT DETECTED NOT DETECTED Final   Enterotoxigenic E coli (ETEC) NOT DETECTED NOT DETECTED Final   Shiga like toxin producing E coli (STEC) NOT DETECTED NOT DETECTED Final   Shigella/Enteroinvasive E coli (EIEC) NOT DETECTED NOT DETECTED Final   Cryptosporidium NOT DETECTED NOT DETECTED Final   Cyclospora cayetanensis NOT DETECTED NOT DETECTED Final   Entamoeba histolytica NOT DETECTED NOT DETECTED Final   Giardia lamblia NOT DETECTED NOT DETECTED Final   Adenovirus F40/41 NOT DETECTED NOT DETECTED Final   Astrovirus NOT DETECTED NOT DETECTED Final   Norovirus GI/GII NOT DETECTED NOT DETECTED Final   Rotavirus A NOT DETECTED NOT DETECTED Final   Sapovirus (I, II, IV, and V) NOT DETECTED NOT DETECTED Final    Comment: Performed at [facility], [address] [place], [place] [address]  C Difficile Quick Screen (NO PCR Reflex)     Status: None   Collection Time: 11/13/20  3:00 PM   Specimen: Stool  Result Value Ref Range Status   C Diff antigen NEGATIVE NEGATIVE Final   C Diff toxin NEGATIVE NEGATIVE Final   C Diff interpretation No C. difficile detected.  Final  Comment: Performed at [facility], [address] [place], [place] [address]     Radiology Studies: No results found.  Scheduled Meds:  amLODipine  10 mg Oral Daily   amoxicillin-clavulanate  1 tablet Oral Q12H   aspirin EC  81 mg Oral Daily   atorvastatin  80 mg Oral Daily   Chlorhexidine Gluconate Cloth  6 each Topical Daily   cholecalciferol  1,000 Units Oral Daily   clopidogrel  75 mg Oral Q breakfast   enoxaparin (LOVENOX) injection  40 mg Subcutaneous Q24H   escitalopram  10 mg Oral Daily   feeding supplement (GLUCERNA SHAKE)  237 mL Oral TID BM   insulin aspart  0-6 Units Subcutaneous TID WC   insulin aspart  3 Units Subcutaneous TID WC   insulin detemir  8 Units Subcutaneous Daily   loratadine  10 mg Oral Daily   multivitamin with minerals  1 tablet Oral Daily   potassium chloride  40 mEq Oral Once   Ensure Max Protein  11 oz Oral Daily   rOPINIRole  0.25 mg Oral QHS   saccharomyces boulardii  250 mg Oral BID   Continuous Infusions:  lactated ringers 35 mL/hr at 11/16/20 1528     LOS: 9 days   Time spent: 35 minutes  Daaron Dimarco Wynetta Emery, MD How to contact the [facility] Attending or Consulting provider [place] or covering provider during after hours [place], for this patient?  Check the care team in [facility] and look for a) attending/consulting [facility] provider listed and b) the [facility] team listed Log into [email or web address] and use [place]'s universal password to access. If you do not have the password, please contact the hospital operator. Locate the [facility] provider you are looking for under [facility] and page to a number that you can be directly reached. If you still have difficulty reaching the provider, please page the [facility] (Director on Call) for the Hospitalists listed on amion for assistance.   If 7PM-7AM, please contact night-coverage [email or web address] 11/19/2020, 4:19 PM

## 2020-11-19 NOTE — Progress Notes (Addendum)
Subjective: Patient doing well today.  No complaints.  Tolerating diet.  Objective: Vital signs in last 24 hours: Temp:  [97.6 F (36.4 C)-99.5 F (37.5 C)] 99 F (37.2 C) (09/17 1403) Pulse Rate:  [87-96] 87 (09/17 1403) Resp:  [19-22] 19 (09/17 0319) BP: (120-143)/(55-66) 120/55 (09/17 1403) SpO2:  [91 %-95 %] 95 % (09/17 1403) Weight:  [65.1 kg] 65.1 kg (09/17 0641) Last BM Date: 11/19/20 General:   Alert and oriented, pleasant Head:  Normocephalic and atraumatic. Eyes:  No icterus, sclera clear. Conjuctiva pink.  Abdomen:  Bowel sounds present, soft, non-tender, non-distended. No HSM or hernias noted. No rebound or guarding. No masses appreciated  Msk:  Symmetrical without gross deformities. Normal posture. Pulses:  Normal pulses noted. Extremities:  Without clubbing or edema. Neurologic:  Alert and  oriented x4;  grossly normal neurologically. Skin:  Warm and dry, intact without significant lesions.  Cervical Nodes:  No significant cervical adenopathy. Psych:  Alert and cooperative. Normal mood and affect.  Intake/Output from previous day: No intake/output data recorded. Intake/Output this shift: Total I/O In: 120 [P.O.:120] Out: -   Lab Results: Recent Labs    11/18/20 0531 11/19/20 0447  WBC 11.6* 9.0  HGB 13.1 11.4*  HCT 39.5 34.4*  PLT 411* 357   BMET Recent Labs    11/17/20 0506 11/18/20 0531 11/19/20 0447  NA 134* 137 134*  K 3.2* 2.5* 2.7*  CL 101 102 102  CO2 21* 24 22  GLUCOSE 262* 161* 138*  BUN '12 14 11  '$ CREATININE 1.14* 0.79 0.69  CALCIUM 8.3* 8.4* 8.1*   LFT Recent Labs    11/19/20 0447  PROT 5.7*  ALBUMIN 2.1*  AST 16  ALT 15  ALKPHOS 54  BILITOT 0.5  BILIDIR 0.1  IBILI 0.4   PT/INR No results for input(s): LABPROT, INR in the last 72 hours. Hepatitis Panel No results for input(s): HEPBSAG, HCVAB, HEPAIGM, HEPBIGM in the last 72 hours.   Studies/Results: No results found.  Assessment: *Acute enterocolitis *Abdominal  pain due to above *Sepsis-resolved  Plan: Patient continues to improve.  She is tolerating her diet without any problems.  Abdominal pain improved.  Continue empiric antibiotics.  Continue supportive measures.  Continue on soft diet.  Will likely need outpatient colonoscopy which we can arrange. Case discussed with hospitalist. Plan for DC to rehab. Can follow up in our office.   Elon Alas. Abbey Chatters, D.O. Gastroenterology and Hepatology [facility]   LOS: 9 days    11/19/2020, 3:12 PM

## 2020-11-19 NOTE — Progress Notes (Signed)
Date and time results received: 11/19/20   Test: Potassium  Critical Value: 2.7  Name of Provider Notified: Murvin Natal, MD  No orders received yet

## 2020-11-20 DIAGNOSIS — K529 Noninfective gastroenteritis and colitis, unspecified: Secondary | ICD-10-CM | POA: Diagnosis not present

## 2020-11-20 DIAGNOSIS — R109 Unspecified abdominal pain: Secondary | ICD-10-CM | POA: Diagnosis not present

## 2020-11-20 LAB — BASIC METABOLIC PANEL
Anion gap: 4 — ABNORMAL LOW (ref 5–15)
BUN: 10 mg/dL (ref 8–23)
CO2: 25 mmol/L (ref 22–32)
Calcium: 7.5 mg/dL — ABNORMAL LOW (ref 8.9–10.3)
Chloride: 105 mmol/L (ref 98–111)
Creatinine, Ser: 0.61 mg/dL (ref 0.44–1.00)
GFR, Estimated: >60 mL/min (ref >60)
Glucose, Bld: 169 mg/dL — ABNORMAL HIGH (ref 70–99)
Potassium: 3.6 mmol/L (ref 3.5–5.1)
Sodium: 134 mmol/L — ABNORMAL LOW (ref 135–145)

## 2020-11-20 LAB — GLUCOSE, CAPILLARY
Glucose-Capillary: 186 mg/dL — ABNORMAL HIGH (ref 70–99)
Glucose-Capillary: 266 mg/dL — ABNORMAL HIGH (ref 70–99)
Glucose-Capillary: 209 mg/dL — ABNORMAL HIGH (ref 70–99)
Glucose-Capillary: 171 mg/dL — ABNORMAL HIGH (ref 70–99)

## 2020-11-20 MED ORDER — POTASSIUM CHLORIDE CRYS ER 10 MEQ PO TBCR
10.0000 meq | EXTENDED_RELEASE_TABLET | Freq: Every day | ORAL | Status: AC
  Administered 2020-11-20 – 2020-11-22 (×3): 10 meq via ORAL
  Filled 2020-11-20 (×3): qty 1

## 2020-11-20 NOTE — Progress Notes (Signed)
PROGRESS NOTE    Cheyenne [name]  [id number] DOB: 12/24/1950 DOA: 11/10/2020 PCP: Toni Arthurs, PA  Brief Narrative:   Cheyenne [name] is a 70 y.o. female with medical history significant for type II DM, CVA, hypertension, hyperlipidemia, COPD, tobacco use who presents to the emergency department due to 2 day onset of constant lower abdominal pain.  She was also noted to have some associated nausea and vomiting and was admitted for enterocolitis based on CT findings.  She has been started on IV fluid as well as IV Zosyn.  She was noted to have hyperglycemia secondary to type 2 diabetes which has now resolved.  She appears to be tolerating more of her soft diet slowly, but continues to have significant abdominal pain.  She continues to have some AKI as well as hypokalemia today.  Assessment & Plan:   Principal Problem:   Enterocolitis Active Problems:   Essential hypertension   Cerebrovascular accident (CVA) ([place])   Abdominal pain   Nausea & vomiting   AKI (acute kidney injury) ([place])   Hyperglycemia due to diabetes mellitus (HCC)   Leukocytosis   Mixed hyperlipidemia   Tobacco use   Asthma   SIRS (systemic inflammatory response syndrome) (HCC)   Restless leg syndrome   Vitamin D deficiency   Enteritis   Protein-calorie malnutrition, severe  Sepsis present on admission secondary to enterocolitis-RESOLVED -Patient patient still not tolerating much overnight -Repeat CT abdomen/pelvis with contrast on 9/12 with findings of focal peritoneal irritation, but enteritis appears to be improving -treated with IV Zosyn and transitioned to oral augmentin which is completing.  -Blood cultures with no growth -leukocytosis has RESOLVED.   Persistent abdominal pain - IMPROVING to nearly resolved  - pain seems out of proportion to labs and CT findings - RESOLVED - I have asked for GI consultation to assist with eval and diagnosis, management options - finishing course of antibiotics,  GI planning outpatient colonoscopy that they will arrange   Prerenal AKI - RESOLVED  -Baseline creatinine 0.7-0.9 -Reduced IV fluid hydration -Hold losartan and chlorthalidone -Monitor strict I's and O's, currently nothing recorded   Hypokalemia - REPLETED -Replete and reevaluate in a.m.  Additional oral replacement ordered.  -Likely related to recent diarrhea with C. difficile negative and GI panel negative   Hyperglycemia secondary to type 2 diabetes-improved Hypoglycemia from insulin  - reduced levemir and SSI coverage, CBG 5x per day  CBG (last 3)  Recent Labs    11/19/20 2131 11/20/20 0740 11/20/20 1101  GLUCAP 243* 186* 266*   Essential hypertension -IV labetalol added for poor BP control -Continue Norvasc -DC chlorthalidone and Cozaar 9/13 due to worsening AKI   History of CVA/dyslipidemia -Lipitor -Continue aspirin   Restless leg syndrome -Requip   History of COPD with ongoing tobacco abuse -Counseled on cessation -Albuterol as needed for shortness of breath or wheezing    DVT prophylaxis: Lovenox Code Status: Full Family Communication: updated patient at bedside, who verbalized understanding Disposition Plan: working on SNF, [place] AFTER ALL OF THESE DAYS Status is: Inpatient   Remains inpatient appropriate because:IV treatments appropriate due to intensity of illness or inability to take PO and Inpatient level of care appropriate due to severity of illness   Dispo: The patient is from: Home              Anticipated d/c is to: SNF              Patient currently is  medically stable to d/c.              Difficult to place patient No    Consultants:  None   Procedures:  See below  Antimicrobials:  Anti-infectives (From admission, onward)    Start     Dose/Rate Route Frequency Ordered Stop   11/17/20 2200  amoxicillin-clavulanate (AUGMENTIN) 875-125 MG per tablet 1 tablet        1 tablet Oral Every 12 hours 11/17/20 1156  11/21/20 0959   11/10/20 1400  piperacillin-tazobactam (ZOSYN) IVPB 3.375 g  Status:  Discontinued        3.375 g 12.5 mL/hr over 240 Minutes Intravenous Every 8 hours 11/10/20 0732 11/17/20 1156   11/10/20 1200  piperacillin-tazobactam (ZOSYN) IVPB 3.375 g  Status:  Discontinued        3.375 g 100 mL/hr over 30 Minutes Intravenous Every 6 hours 11/10/20 0654 11/10/20 0732   11/10/20 0615  piperacillin-tazobactam (ZOSYN) IVPB 3.375 g        3.375 g 12.5 mL/hr over 240 Minutes Intravenous  Once 11/10/20 0603 11/10/20 1038       Subjective: Pt ambulating more with no abdominal pain today, eating today, tolerating diet.   Objective: Vitals:   11/20/20 0624 11/20/20 0629 11/20/20 0844 11/20/20 0844  BP: (!) 133/59  136/65 136/65  Pulse: 86   99  Resp: 19     Temp: 98 F (36.7 C)     TempSrc: Oral     SpO2: 92%   96%  Weight:  65.5 kg    Height:  '5\' 4"'$  (1.626 m)      Intake/Output Summary (Last 24 hours) at 11/20/2020 1153 Last data filed at 11/20/2020 0900 Gross per 24 hour  Intake 480 ml  Output --  Net 480 ml    Filed Weights   11/10/20 0356 11/19/20 0641 11/20/20 0629  Weight: 63.5 kg 65.1 kg 65.5 kg   Examination:  General exam: Appears calm and comfortable NAD Respiratory system: Clear to auscultation. Respiratory effort normal. Cardiovascular system: normal S1 & S2 heard.  Gastrointestinal system: Abdominal pain is nearly resolved.  Central nervous system: Alert and awake Extremities: No edema Skin: No significant lesions noted Psychiatry: normal affect.  Data Reviewed: I have personally reviewed following labs and imaging studies  CBC: Recent Labs  Lab 11/14/20 0551 11/15/20 0608 11/16/20 0503 11/18/20 0531 11/19/20 0447  WBC 8.1 10.6* 8.5 11.6* 9.0  NEUTROABS  --   --   --   --  7.6  HGB 13.3 12.5 12.4 13.1 11.4*  HCT 40.4 38.3 37.5 39.5 34.4*  MCV 87.6 87.6 87.6 88.2 88.0  PLT 336 358 357 411* [id number]   Basic Metabolic Panel: Recent Labs  Lab  11/14/20 0551 11/15/20 0608 11/16/20 0503 11/17/20 0506 11/18/20 0531 11/19/20 0447 11/20/20 0439  NA 141   < > 139 134* 137 134* 134*  K 4.0   < > 2.9* 3.2* 2.5* 2.7* 3.6  CL 109   < > 108 101 102 102 105  CO2 18*   < > 23 21* '24 22 25  '$ GLUCOSE 108*   < > 60* 262* 161* 138* 169*  BUN 18   < > '15 12 14 11 10  '$ CREATININE 1.19*   < > 0.87 1.14* 0.79 0.69 0.61  CALCIUM 8.6*   < > 8.1* 8.3* 8.4* 8.1* 7.5*  MG 2.0  --  2.0  --  1.8 2.3  --    < > =  values in this interval not displayed.   GFR: Estimated Creatinine Clearance: 57.3 mL/min (by C-G formula based on SCr of 0.61 mg/dL). Liver Function Tests: Recent Labs  Lab 11/19/20 0447  AST 16  ALT 15  ALKPHOS 54  BILITOT 0.5  PROT 5.7*  ALBUMIN 2.1*    No results for input(s): LIPASE, AMYLASE in the last 168 hours.  No results for input(s): AMMONIA in the last 168 hours. Coagulation Profile: No results for input(s): INR, PROTIME in the last 168 hours.  Cardiac Enzymes: No results for input(s): CKTOTAL, CKMB, CKMBINDEX, TROPONINI in the last 168 hours. BNP (last 3 results) No results for input(s): PROBNP in the last 8760 hours. HbA1C: No results for input(s): HGBA1C in the last 72 hours. CBG: Recent Labs  Lab 11/19/20 1118 11/19/20 1600 11/19/20 2131 11/20/20 0740 11/20/20 1101  GLUCAP 156* 113* 243* 186* 266*   Lipid Profile: No results for input(s): CHOL, HDL, LDLCALC, TRIG, CHOLHDL, LDLDIRECT in the last 72 hours. Thyroid Function Tests: No results for input(s): TSH, T4TOTAL, FREET4, T3FREE, THYROIDAB in the last 72 hours. Anemia Panel: No results for input(s): VITAMINB12, FOLATE, FERRITIN, TIBC, IRON, RETICCTPCT in the last 72 hours. Sepsis Labs: No results for input(s): PROCALCITON, LATICACIDVEN in the last 168 hours.  Recent Results (from the past 240 hour(s))  MRSA Next Gen by PCR, Nasal     Status: None   Collection Time: 11/10/20  2:42 PM   Specimen: Nasal Mucosa; Nasal Swab  Result Value Ref Range  Status   MRSA by PCR Next Gen NOT DETECTED NOT DETECTED Final    Comment: (NOTE) The GeneXpert MRSA Assay (FDA approved for NASAL specimens only), is one component of a comprehensive MRSA colonization surveillance program. It is not intended to diagnose MRSA infection nor to guide or monitor treatment for MRSA infections. Test performance is not FDA approved in patients less than 37 years old. Performed at [facility], [address] [place], [place] [address]   Gastrointestinal Panel by PCR , Stool     Status: None   Collection Time: 11/13/20  3:00 PM   Specimen: Stool  Result Value Ref Range Status   Campylobacter species NOT DETECTED NOT DETECTED Final   Plesimonas shigelloides NOT DETECTED NOT DETECTED Final   Salmonella species NOT DETECTED NOT DETECTED Final   Yersinia enterocolitica NOT DETECTED NOT DETECTED Final   Vibrio species NOT DETECTED NOT DETECTED Final   Vibrio cholerae NOT DETECTED NOT DETECTED Final   Enteroaggregative E coli (EAEC) NOT DETECTED NOT DETECTED Final   Enteropathogenic E coli (EPEC) NOT DETECTED NOT DETECTED Final   Enterotoxigenic E coli (ETEC) NOT DETECTED NOT DETECTED Final   Shiga like toxin producing E coli (STEC) NOT DETECTED NOT DETECTED Final   Shigella/Enteroinvasive E coli (EIEC) NOT DETECTED NOT DETECTED Final   Cryptosporidium NOT DETECTED NOT DETECTED Final   Cyclospora cayetanensis NOT DETECTED NOT DETECTED Final   Entamoeba histolytica NOT DETECTED NOT DETECTED Final   Giardia lamblia NOT DETECTED NOT DETECTED Final   Adenovirus F40/41 NOT DETECTED NOT DETECTED Final   Astrovirus NOT DETECTED NOT DETECTED Final   Norovirus GI/GII NOT DETECTED NOT DETECTED Final   Rotavirus A NOT DETECTED NOT DETECTED Final   Sapovirus (I, II, IV, and V) NOT DETECTED NOT DETECTED Final    Comment: Performed at [facility], [address] [place], [place] [address]  C Difficile Quick Screen (NO PCR Reflex)     Status: None   Collection  Time:  11/13/20  3:00 PM   Specimen: Stool  Result Value Ref Range Status   C Diff antigen NEGATIVE NEGATIVE Final   C Diff toxin NEGATIVE NEGATIVE Final   C Diff interpretation No C. difficile detected.  Final    Comment: Performed at [facility], [address], [place], [place] [address]     Radiology Studies: No results found.  Scheduled Meds:  amLODipine  10 mg Oral Daily   amoxicillin-clavulanate  1 tablet Oral Q12H   aspirin EC  81 mg Oral Daily   atorvastatin  80 mg Oral Daily   Chlorhexidine Gluconate Cloth  6 each Topical Daily   cholecalciferol  1,000 Units Oral Daily   clopidogrel  75 mg Oral Q breakfast   enoxaparin (LOVENOX) injection  40 mg Subcutaneous Q24H   escitalopram  10 mg Oral Daily   feeding supplement (GLUCERNA SHAKE)  237 mL Oral TID BM   insulin aspart  0-6 Units Subcutaneous TID WC   insulin aspart  3 Units Subcutaneous TID WC   insulin detemir  8 Units Subcutaneous Daily   loratadine  10 mg Oral Daily   multivitamin with minerals  1 tablet Oral Daily   potassium chloride  10 mEq Oral Daily   Ensure Max Protein  11 oz Oral Daily   rOPINIRole  0.25 mg Oral QHS   saccharomyces boulardii  250 mg Oral BID   Continuous Infusions:  lactated ringers 35 mL/hr at 11/16/20 1528     LOS: 10 days   Time spent: 35 minutes  Neidy Guerrieri Wynetta Emery, MD How to contact the [facility] Attending or Consulting provider [place] or covering provider during after hours [place], for this patient?  Check the care team in [facility] and look for a) attending/consulting TRH provider listed and b) the [facility] team listed Log into [email or web address] and use [place]'s universal password to access. If you do not have the password, please contact the hospital operator. Locate the [facility] provider you are looking for under [facility] and page to a number that you can be directly reached. If you still have difficulty reaching the provider, please page the [facility] (Director on Call) for the Hospitalists  listed on amion for assistance.   If 7PM-7AM, please contact night-coverage [email or web address] 11/20/2020, 11:53 AM

## 2020-11-21 DIAGNOSIS — K529 Noninfective gastroenteritis and colitis, unspecified: Secondary | ICD-10-CM | POA: Diagnosis not present

## 2020-11-21 DIAGNOSIS — E43 Unspecified severe protein-calorie malnutrition: Secondary | ICD-10-CM

## 2020-11-21 DIAGNOSIS — I10 Essential (primary) hypertension: Secondary | ICD-10-CM | POA: Diagnosis not present

## 2020-11-21 DIAGNOSIS — E1165 Type 2 diabetes mellitus with hyperglycemia: Secondary | ICD-10-CM | POA: Diagnosis not present

## 2020-11-21 DIAGNOSIS — R1030 Lower abdominal pain, unspecified: Secondary | ICD-10-CM | POA: Diagnosis not present

## 2020-11-21 LAB — GLUCOSE, CAPILLARY
Glucose-Capillary: 197 mg/dL — ABNORMAL HIGH (ref 70–99)
Glucose-Capillary: 318 mg/dL — ABNORMAL HIGH (ref 70–99)
Glucose-Capillary: 196 mg/dL — ABNORMAL HIGH (ref 70–99)
Glucose-Capillary: 201 mg/dL — ABNORMAL HIGH (ref 70–99)

## 2020-11-21 LAB — RESP PANEL BY RT-PCR (FLU A&B, COVID) ARPGX2
Influenza A by PCR: NEGATIVE
Influenza B by PCR: NEGATIVE
SARS Coronavirus 2 by RT PCR: NEGATIVE

## 2020-11-21 MED ORDER — LOSARTAN POTASSIUM 25 MG PO TABS: 25.0000 mg | ORAL_TABLET | Freq: Every day | ORAL | Status: DC

## 2020-11-21 MED ORDER — LOPERAMIDE HCL 2 MG PO CAPS: 2.0000 mg | ORAL_CAPSULE | Freq: Three times a day (TID) | ORAL | 0 refills | Status: DC | PRN

## 2020-11-21 MED ORDER — ADULT MULTIVITAMIN W/MINERALS CH: 1.0000 | ORAL_TABLET | Freq: Every day | ORAL | Status: DC

## 2020-11-21 MED ORDER — INSULIN NPH (HUMAN) (ISOPHANE) 100 UNIT/ML ~~LOC~~ SUSP: 13.0000 [IU] | Freq: Two times a day (BID) | SUBCUTANEOUS | 11 refills | Status: DC

## 2020-11-21 MED ORDER — SACCHAROMYCES BOULARDII 250 MG PO CAPS: 250.0000 mg | ORAL_CAPSULE | Freq: Two times a day (BID) | ORAL | 0 refills | Status: AC

## 2020-11-21 MED ORDER — LOPERAMIDE HCL 2 MG PO CAPS
2.0000 mg | ORAL_CAPSULE | ORAL | Status: DC | PRN
  Administered 2020-11-21: 2 mg via ORAL
  Filled 2020-11-21: qty 1

## 2020-11-21 MED ORDER — CLONAZEPAM 0.5 MG PO TABS: 0.5000 mg | ORAL_TABLET | Freq: Two times a day (BID) | ORAL | 0 refills | Status: DC | PRN

## 2020-11-21 MED ORDER — GLUCERNA SHAKE PO LIQD: 237.0000 mL | Freq: Three times a day (TID) | ORAL | 0 refills | Status: DC

## 2020-11-21 MED ORDER — ENSURE MAX PROTEIN PO LIQD: 11.0000 [oz_av] | Freq: Every day | ORAL | Status: DC

## 2020-11-21 MED ORDER — POTASSIUM CHLORIDE CRYS ER 10 MEQ PO TBCR: 10.0000 meq | EXTENDED_RELEASE_TABLET | Freq: Every day | ORAL | 0 refills | Status: DC

## 2020-11-21 NOTE — Progress Notes (Signed)
Telemetry called and reported patient had a 17 beat run of SVT. Notified Brianna Lpn which is the patient's nurse of report.

## 2020-11-21 NOTE — TOC Progression Note (Signed)
Transition of Care [facility]) - Progression Note    Patient Details  Name: Cheyenne [name] MRN: [id number] Date of Birth: 1950/10/11  Transition of Care [facility]) CM/SW Contact  Ihor Gully, LCSW Phone Number: 11/21/2020, 1:15 PM  Clinical Narrative:    Granddaughter informed of insurance denial. She plans to complete a family appeal. Granddaughter provided patient [name] subscriber number.    Expected Discharge Plan: Skilled Nursing Facility Barriers to Discharge: Continued Medical Work up  Expected Discharge Plan and Services Expected Discharge Plan: [place]                                               Social Determinants of Health (SDOH) Interventions    Readmission Risk Interventions Readmission Risk Prevention Plan 11/14/2020  Transportation Screening Complete  Home Care Screening Complete  Medication Review (RN CM) Complete  Some recent data might be hidden

## 2020-11-21 NOTE — Progress Notes (Signed)
PT Cancellation Note  Patient Details Name: Cheyenne [name] MRN: [id number] DOB: Jul 28, 1950   Cancelled Treatment:    Reason Eval/Treat Not Completed: Other (comment). Per pt and nursing, pt up in chair to attempt to eat breakfast this morning but began to feel ill, having diarrhea and now back in bed. Pt requests to hold therapy until tomorrow when feeling better. Will check back as schedule permits.    Tori Alizea Pell PT, DPT 11/21/20, 11:06 AM

## 2020-11-21 NOTE — TOC Progression Note (Signed)
Transition of Care [facility]) - Progression Note    Patient Details  Name: Cheyenne [name] MRN: [id number] Date of Birth: 1950-11-25  Transition of [facility]) CM/SW Contact  Ihor Gully, LCSW Phone Number: 11/21/2020, 10:56 AM  Clinical Narrative:    Beatrice Lecher informed that patient was denied. Peer to Peer number is [phone], option 3, reference [id number]. Attending informed. Message left for granddaughter, Anderson Malta, requesting return contact to discuss.  Expected Discharge Plan: Skilled Nursing Facility Barriers to Discharge: Continued Medical Work up  Expected Discharge Plan and Services Expected Discharge Plan: [place]                                               Social Determinants of Health (SDOH) Interventions    Readmission Risk Interventions Readmission Risk Prevention Plan 11/14/2020  Transportation Screening Complete  Home Care Screening Complete  Medication Review (RN CM) Complete  Some recent data might be hidden

## 2020-11-21 NOTE — Care Management Important Message (Signed)
Important Message  Patient Details  Name: Cheyenne [name] MRN: [id number] Date of Birth: 1950/04/10   Medicare Important Message Given:  Yes     Tommy Medal 11/21/2020, 11:35 AM

## 2020-11-21 NOTE — Plan of Care (Signed)

## 2020-11-21 NOTE — Progress Notes (Addendum)
11/21/2020 12:26 PM  I called [employer] to do a peer to peer.  After speaking with MD decision was made to approve patient for SNF for 7 days.   Murvin Natal MD  How to contact the [facility] Attending or Consulting provider [place] or covering provider during after hours [place], for this patient?  Check the care team in [facility] and look for a) attending/consulting TRH provider listed and b) the [facility] team listed Log into [email or web address] and use [place]'s universal password to access. If you do not have the password, please contact the hospital operator. Locate the [facility] provider you are looking for under [facility] and page to a number that you can be directly reached. If you still have difficulty reaching the provider, please page the [facility] (Director on Call) for the Hospitalists listed on amion for assistance.

## 2020-11-21 NOTE — Discharge Summary (Signed)
Physician Discharge Summary  TRINI HEBBARD [id number] DOB: September 20, 1950 DOA: 11/10/2020  PCP: Toni Arthurs, PA  Admit date: 11/10/2020 Discharge date: 11/21/2020  Disposition: SNF   Recommendations for Outpatient Follow-up:  Please obtain BMP/CBC in one week Please check CBG 4 times per day  Please follow up with [facility] as scheduled   Discharge Condition: STABLE   CODE STATUS: FULL  DIET: soft foods diet heart healthy carb modified    Brief Hospitalization Summary: Please see all hospital notes, images, labs for full details of the hospitalization. ADMISSION HPI: Cheyenne [name] is a 70 y.o. female with medical history significant for type II DM, CVA, hypertension, hyperlipidemia, COPD, tobacco use who presents to the emergency department due to 2 day onset of constant lower abdominal pain.  He was associated with nausea and vomiting, there was no known alleviating factor and the pain was severe and was described as sharp and crampy in nature.  She denies fever, chills, diarrhea, constipation.  Last bowel movement was 2 to 3 days ago and was normal per patient.  She presented to the emergency department yesterday in the evening, but she left due to long wait in the ED.  She returned this morning with EMS due to persistent and worsening of the abdominal pain.   ED Course:  In the emergency department, tachypneic and tachycardic.  Work-up in the emergency department showed leukocytosis, BUN to creatinine 22/1.46 (baseline creatinine is 0.7-0.9).  Lipase 20 CT abdomen and pelvis with contrast showed enterocolitis, small to moderate abdominal ascites. She was treated with IV morphine 4 mg x 1, IV Zofran was given, she was started on IV hydration and IV Zosyn was given.  Hospitalist was asked to admit patient for further evaluation and management.  Hospital Course by problem list  Sepsis present on admission secondary to enterocolitis-RESOLVED -Patient patient still not tolerating  much overnight -Repeat CT abdomen/pelvis with contrast on 9/12 with findings of focal peritoneal irritation, but enteritis appears to be improving -treated with IV Zosyn and transitioned to oral augmentin which is completed.  -Blood cultures with no growth -leukocytosis has RESOLVED.    Persistent abdominal pain - IMPROVING to nearly resolved  - pain seems out of proportion to labs and CT findings - RESOLVED - I have asked for GI consultation to assist with eval and diagnosis, management options - COMPLETED course of antibiotics, GI planning outpatient colonoscopy that they will arrange   Prerenal AKI - RESOLVED  -Baseline creatinine 0.7-0.9 -Reduced IV fluid hydration -Hold losartan and chlorthalidone -Monitor strict I's and O's, currently nothing recorded   Hypokalemia - REPLETED -Replete and reevaluate in a.m.  Additional oral replacement ordered.  -Likely related to recent diarrhea with C. difficile negative and GI panel negative   Hyperglycemia secondary to type 2 diabetes - resume home treatment plan - please check CBG 4-5 times per day    Essential hypertension -resume home meds  -DC chlorthalidone   History of CVA/dyslipidemia -Lipitor -Continue aspirin   Restless leg syndrome -Requip ordered    History of COPD with ongoing tobacco abuse -Counseled on cessation -Albuterol as needed for shortness of breath or wheezing    DVT prophylaxis: Lovenox Code Status: Full Family Communication: updated patient at bedside, who verbalized understanding Disposition Plan: SNF  Status is: Inpatient   Remains inpatient appropriate because:IV treatments appropriate due to intensity of illness or inability to take PO and Inpatient level of care appropriate due to severity of illness   Dispo: The  patient is from: Home              Anticipated d/c is to: SNF              Patient currently is medically stable to d/c.              Difficult to place patient No    Consultants:   None   Procedures:  See below Discharge Diagnoses:  Principal Problem:   Enterocolitis Active Problems:   Essential hypertension   Cerebrovascular accident (CVA) ([place])   Abdominal pain   Nausea & vomiting   AKI (acute kidney injury) ([place])   Hyperglycemia due to diabetes mellitus (HCC)   Leukocytosis   Mixed hyperlipidemia   Tobacco use   Asthma   SIRS (systemic inflammatory response syndrome) (HCC)   Restless leg syndrome   Vitamin D deficiency   Enteritis   Protein-calorie malnutrition, severe   Discharge Instructions:  Allergies as of 11/21/2020       Reactions   Gabapentin    Bad dreams   Pregabalin    Drowsiness. Mind not clear.   Codeine Itching        Medication List     STOP taking these medications    chlorthalidone 25 MG tablet Commonly known as: HYGROTON   NovoLOG FlexPen 100 UNIT/ML FlexPen Generic drug: insulin aspart       TAKE these medications    acetaminophen 325 MG tablet Commonly known as: TYLENOL Take 650 mg by mouth every 6 (six) hours as needed for mild pain.   albuterol 108 (90 Base) MCG/ACT inhaler Commonly known as: VENTOLIN HFA Inhale 1-2 puffs into the lungs every 6 (six) hours as needed for wheezing or shortness of breath.   alendronate 70 MG tablet Commonly known as: FOSAMAX Take 70 mg by mouth once a week. Wednesday   amLODipine 10 MG tablet Commonly known as: NORVASC Take 1 tablet (10 mg total) by mouth daily.   aspirin 81 MG EC tablet Take 1 tablet by mouth daily.   atorvastatin 40 MG tablet Commonly known as: LIPITOR Take 80 mg by mouth daily.   cholecalciferol 25 MCG (1000 UNIT) tablet Commonly known as: VITAMIN D3 Take 1,000 Units by mouth daily.   clonazePAM 0.5 MG tablet Commonly known as: KLONOPIN Take 1 tablet (0.5 mg total) by mouth 2 (two) times daily as needed.   clopidogrel 75 MG tablet Commonly known as: PLAVIX Take 1 tablet (75 mg total) by mouth daily with breakfast.   escitalopram  10 MG tablet Commonly known as: LEXAPRO Take 10 mg by mouth daily.   feeding supplement (GLUCERNA SHAKE) Liqd Take 237 mLs by mouth 3 (three) times daily between meals.   Ensure Max Protein Liqd Take 330 mLs (11 oz total) by mouth daily. Start taking on: November 22, 2020   glimepiride 1 MG tablet Commonly known as: AMARYL Take 1 mg by mouth daily with breakfast. What changed: Another medication with the same name was removed. Continue taking this medication, and follow the directions you see here.   insulin NPH Human 100 UNIT/ML injection Commonly known as: NOVOLIN N Inject 0.13 mLs (13 Units total) into the skin 2 (two) times daily at 8 am and 10 pm. What changed:  how much to take additional instructions   loperamide 2 MG capsule Commonly known as: IMODIUM Take 1 capsule (2 mg total) by mouth 3 (three) times daily as needed for diarrhea or loose stools.   loratadine 10 MG  tablet Commonly known as: CLARITIN Take 10 mg by mouth daily.   losartan 25 MG tablet Commonly known as: COZAAR Take 1 tablet (25 mg total) by mouth daily. What changed:  medication strength how much to take when to take this   multivitamin with minerals Tabs tablet Take 1 tablet by mouth daily. Start taking on: November 22, 2020   nicotine 21 mg/24hr patch Commonly known as: NICODERM CQ - dosed in mg/24 hours Place 1 patch (21 mg total) onto the skin daily.   potassium chloride 10 MEQ tablet Commonly known as: KLOR-CON Take 1 tablet (10 mEq total) by mouth daily for 5 days. What changed:  medication strength how much to take when to take this   rOPINIRole 0.25 MG tablet Commonly known as: REQUIP Take 0.25 mg by mouth at bedtime.   saccharomyces boulardii 250 MG capsule Commonly known as: FLORASTOR Take 1 capsule (250 mg total) by mouth 2 (two) times daily.        Contact information for after-discharge care     Destination     [facility] .   Service: Skilled Nursing Contact information: [address] [address] [phone]                    Allergies  Allergen Reactions   Gabapentin     Bad dreams   Pregabalin     Drowsiness. Mind not clear.   Codeine Itching   Allergies as of 11/21/2020       Reactions   Gabapentin    Bad dreams   Pregabalin    Drowsiness. Mind not clear.   Codeine Itching        Medication List     STOP taking these medications    chlorthalidone 25 MG tablet Commonly known as: HYGROTON   NovoLOG FlexPen 100 UNIT/ML FlexPen Generic drug: insulin aspart       TAKE these medications    acetaminophen 325 MG tablet Commonly known as: TYLENOL Take 650 mg by mouth every 6 (six) hours as needed for mild pain.   albuterol 108 (90 Base) MCG/ACT inhaler Commonly known as: VENTOLIN HFA Inhale 1-2 puffs into the lungs every 6 (six) hours as needed for wheezing or shortness of breath.   alendronate 70 MG tablet Commonly known as: FOSAMAX Take 70 mg by mouth once a week. Wednesday   amLODipine 10 MG tablet Commonly known as: NORVASC Take 1 tablet (10 mg total) by mouth daily.   aspirin 81 MG EC tablet Take 1 tablet by mouth daily.   atorvastatin 40 MG tablet Commonly known as: LIPITOR Take 80 mg by mouth daily.   cholecalciferol 25 MCG (1000 UNIT) tablet Commonly known as: VITAMIN D3 Take 1,000 Units by mouth daily.   clonazePAM 0.5 MG tablet Commonly known as: KLONOPIN Take 1 tablet (0.5 mg total) by mouth 2 (two) times daily as needed.   clopidogrel 75 MG tablet Commonly known as: PLAVIX Take 1 tablet (75 mg total) by mouth daily with breakfast.   escitalopram 10 MG tablet Commonly known as: LEXAPRO Take 10 mg by mouth daily.   feeding supplement (GLUCERNA SHAKE) Liqd Take 237 mLs by mouth 3 (three) times daily between meals.   Ensure Max Protein Liqd Take 330 mLs (11 oz total) by mouth daily. Start  taking on: November 22, 2020   glimepiride 1 MG tablet Commonly known as: AMARYL Take 1 mg by mouth  daily with breakfast. What changed: Another medication with the same name was removed. Continue taking this medication, and follow the directions you see here.   insulin NPH Human 100 UNIT/ML injection Commonly known as: NOVOLIN N Inject 0.13 mLs (13 Units total) into the skin 2 (two) times daily at 8 am and 10 pm. What changed:  how much to take additional instructions   loperamide 2 MG capsule Commonly known as: IMODIUM Take 1 capsule (2 mg total) by mouth 3 (three) times daily as needed for diarrhea or loose stools.   loratadine 10 MG tablet Commonly known as: CLARITIN Take 10 mg by mouth daily.   losartan 25 MG tablet Commonly known as: COZAAR Take 1 tablet (25 mg total) by mouth daily. What changed:  medication strength how much to take when to take this   multivitamin with minerals Tabs tablet Take 1 tablet by mouth daily. Start taking on: November 22, 2020   nicotine 21 mg/24hr patch Commonly known as: NICODERM CQ - dosed in mg/24 hours Place 1 patch (21 mg total) onto the skin daily.   potassium chloride 10 MEQ tablet Commonly known as: KLOR-CON Take 1 tablet (10 mEq total) by mouth daily for 5 days. What changed:  medication strength how much to take when to take this   rOPINIRole 0.25 MG tablet Commonly known as: REQUIP Take 0.25 mg by mouth at bedtime.   saccharomyces boulardii 250 MG capsule Commonly known as: FLORASTOR Take 1 capsule (250 mg total) by mouth 2 (two) times daily.        Procedures/Studies: DG Ribs Unilateral W/Chest Left  Result Date: 10/24/2020 CLINICAL DATA:  Fall and left rib pain. EXAM: LEFT RIBS AND CHEST - 3+ VIEW COMPARISON:  Chest radiograph dated 11/04/2012. FINDINGS: There is diffuse chronic interstitial coarsening. No focal consolidation, pleural effusion, or pneumothorax. The cardiac silhouette is within limits.  Osteopenia with degenerative changes of the spine. No acute osseous pathology. No displaced rib fractures. IMPRESSION: 1. No acute cardiopulmonary process. 2. No displaced rib fractures. Electronically Signed   By: Anner Crete M.D.   On: 10/24/2020 22:36   DG Lumbar Spine Complete  Result Date: 10/24/2020 CLINICAL DATA:  Fall, left lower rib pain, low back pain EXAM: LUMBAR SPINE - COMPLETE 4+ VIEW COMPARISON:  None. FINDINGS: Advanced degenerative disc disease changes at L4-5 with disc space loss, vacuum disc and spurring. Degenerative facet disease in the lumbar spine. Normal alignment. No fracture. Diffuse osteopenia. IMPRESSION: No acute bony abnormality. Electronically Signed   By: Rolm Baptise M.D.   On: 10/24/2020 22:34   CT HEAD WO CONTRAST (5MM)  Result Date: 10/30/2020 CLINICAL DATA:  Encephalopathy EXAM: CT HEAD WITHOUT CONTRAST TECHNIQUE: Contiguous axial images were obtained from the base of the skull through the vertex without intravenous contrast. COMPARISON:  Head CT 10/24/2020 and brain MRI 10/25/2020 FINDINGS: Brain: There is no mass, hemorrhage or extra-axial collection. There is generalized atrophy without lobar predilection. Hypodensity of the white matter is most commonly associated with chronic microvascular disease. Focal hypoattenuation at the site of known posterior right frontal lesion. Vascular: No abnormal hyperdensity of the major intracranial arteries or dural venous sinuses. No intracranial atherosclerosis. Skull: The visualized skull base, calvarium and extracranial soft tissues are normal. Sinuses/Orbits: Moderate sphenoid sinus mucosal thickening. The orbits are normal. IMPRESSION: 1. No acute intracranial abnormality. 2. Focal hypoattenuation at the site of known posterior right frontal lesion, likely subacute infarct. Electronically Signed   By: Cletus Gash.D.  On: 10/30/2020 19:16   CT Head Wo Contrast  Result Date: 10/24/2020 CLINICAL DATA:  Head trauma,  minor (Age >= 65y) recent stroke affecting L arm/leg at [facility], now falling EXAM: CT HEAD WITHOUT CONTRAST TECHNIQUE: Contiguous axial images were obtained from the base of the skull through the vertex without intravenous contrast. COMPARISON:  None. FINDINGS: Brain: Patchy low-density throughout the deep white matter, likely chronic small vessel disease. No acute intracranial abnormality. Specifically, no hemorrhage, hydrocephalus, mass lesion, acute infarction, or significant intracranial injury. Vascular: No hyperdense vessel or unexpected calcification. Skull: No acute calvarial abnormality. Sinuses/Orbits: Mucosal thickening throughout the paranasal sinuses, most pronounced in the sphenoid sinuses. Other: None IMPRESSION: Chronic small vessel disease. No acute intracranial abnormality. Chronic sinusitis. Electronically Signed   By: Rolm Baptise M.D.   On: 10/24/2020 22:27   MR ANGIO HEAD WO CONTRAST  Result Date: 10/25/2020 CLINICAL DATA:  Neuro deficit, acute, stroke suspected EXAM: MRI HEAD WITHOUT CONTRAST MRA HEAD WITHOUT CONTRAST TECHNIQUE: Multiplanar, multi-echo pulse sequences of the brain and surrounding structures were acquired without intravenous contrast. Angiographic images of the Circle of Willis were acquired using MRA technique without intravenous contrast. COMPARISON:  No pertinent prior exam. FINDINGS: MRI HEAD FINDINGS Brain: Approximately 1.3 cm focus of peripheral restricted diffusion in the right posterior frontal white matter, most consistent with acute infarct given the patient's risk factors and other findings of chronic microvascular disease. Additional punctate areas of infarct in the more anterior frontal white matter. Moderate to advanced patchy and T2 hyperintensity in the supratentorial white matter. There are numerous small foci of susceptibility artifact throughout the supratentorial brain, predominantly cortical in location and compatible with prior microhemorrhages. No  evidence of acute hemorrhage, mass lesion, hydrocephalus, or extra-axial fluid collection. Vascular: See below. Skull and upper cervical spine: Normal marrow signal. Sinuses/Orbits: Moderate mucosal thickening in the right sphenoid sinus with air-fluid level. Other: No sizable mastoid effusions. MRA HEAD FINDINGS Anterior circulation: Bilateral intracranial ICAs, MCAs, and ACAs are patent without proximal hemodynamically significant stenosis. No aneurysm identified. Posterior circulation: Bilateral intradural vertebral arteries, basilar artery, and posterior cerebral arteries are patent. Moderate mid right and severe distal left P2 PCA stenosis. IMPRESSION: MRI: 1. Approximately 1.3 cm acute infarct in the right frontal white matter with surrounding punctate acute frontal infarcts. 2. Moderate to advanced T2/FLAIR hyperintensities the white matter. 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 3. 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 These hemorrhages are atypical for hypertensive hemorrhages given relative sparing of the basal ganglia, thalami and cerebellum. 4. Moderate mucosal thickening in the right sphenoid sinus with air-fluid level. MRA: 1. No large vessel occlusion. 2. Motion limited distal evaluation with suspected severe distal left P2 PCA and moderate mid right P2 PCA stenosis. Electronically Signed   By: Margaretha Sheffield M.D.   On: 10/25/2020 12:57   MR BRAIN WO CONTRAST  Result Date: 10/25/2020 CLINICAL DATA:  Neuro deficit, acute, stroke suspected EXAM: MRI HEAD WITHOUT CONTRAST MRA HEAD WITHOUT CONTRAST TECHNIQUE: Multiplanar, multi-echo pulse sequences of the brain and surrounding structures were acquired without intravenous contrast. Angiographic images of the Circle of Willis  were acquired using MRA technique without intravenous contrast. COMPARISON:  No pertinent prior exam. FINDINGS: MRI HEAD FINDINGS Brain: Approximately 1.3 cm focus of peripheral restricted diffusion in the right posterior frontal white matter, most consistent with acute infarct given the patient's risk factors  and other findings of chronic microvascular disease. Additional punctate areas of infarct in the more anterior frontal white matter. Moderate to advanced patchy and T2 hyperintensity in the supratentorial white matter. There are numerous small foci of susceptibility artifact throughout the supratentorial brain, predominantly cortical in location and compatible with prior microhemorrhages. No evidence of acute hemorrhage, mass lesion, hydrocephalus, or extra-axial fluid collection. Vascular: See below. Skull and upper cervical spine: Normal marrow signal. Sinuses/Orbits: Moderate mucosal thickening in the right sphenoid sinus with air-fluid level. Other: No sizable mastoid effusions. MRA HEAD FINDINGS Anterior circulation: Bilateral intracranial ICAs, MCAs, and ACAs are patent without proximal hemodynamically significant stenosis. No aneurysm identified. Posterior circulation: Bilateral intradural vertebral arteries, basilar artery, and posterior cerebral arteries are patent. Moderate mid right and severe distal left P2 PCA stenosis. IMPRESSION: MRI: 1. Approximately 1.3 cm acute infarct in the right frontal white matter with surrounding punctate acute frontal infarcts. 2. Moderate to advanced T2/FLAIR hyperintensities the white matter. 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 3. 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 These hemorrhages are atypical for hypertensive hemorrhages given  relative sparing of the basal ganglia, thalami and cerebellum. 4. Moderate mucosal thickening in the right sphenoid sinus with air-fluid level. MRA: 1. No large vessel occlusion. 2. Motion limited distal evaluation with suspected severe distal left P2 PCA and moderate mid right P2 PCA stenosis. Electronically Signed   By: Margaretha Sheffield M.D.   On: 10/25/2020 12:57   CT ABDOMEN PELVIS W CONTRAST  Result Date: 11/14/2020 CLINICAL DATA:  Worsening abdominal pain with nausea, vomiting EXAM: CT ABDOMEN AND PELVIS WITH CONTRAST TECHNIQUE: Multidetector CT imaging of the abdomen and pelvis was performed using the standard protocol following bolus administration of intravenous contrast. CONTRAST:  27m OMNIPAQUE IOHEXOL 350 MG/ML SOLN COMPARISON:  CT abdomen/pelvis 11/10/2020, 11/27/2017 FINDINGS: Lower chest: There are bibasilar consolidations, increased since 11/10/2020. There is a 7 mm nodule in the subpleural right middle lobe, present since 2016 and likely benign. Hepatobiliary: The liver and gallbladder are normal. There is no biliary ductal dilatation. Pancreas: Unremarkable. Spleen: Unremarkable. Adrenals/Urinary Tract: A 1.0 cm right adrenal nodule is unchanged. Thickening of the left adrenal is unchanged. Subcentimeter hypodense lesions in the left kidney are too small to characterize, but likely reflect small cysts. There are no other focal lesions. There are no stones. There is no hydronephrosis or hydroureter. The bladder is unremarkable. Stomach/Bowel: The stomach is unremarkable. There is no evidence of bowel obstruction. Previously seen small bowel wall thickening appears improved. There is mild hyperemia of some loops of small bowel in the lower abdomen. The bowel is otherwise unremarkable. Vascular/Lymphatic: There is extensive calcified atherosclerotic plaque throughout the nonaneurysmal abdominal aorta. The major branch vessels are patent. The main portal and splenic veins are patent. There is a 7  mm portacaval lymph node, nonspecific and unchanged. There is no new or progressive lymphadenopathy in the abdomen or pelvis. Reproductive: A calcified fundal fibroid is unchanged. The uterus and adnexa are otherwise unremarkable. Other: There is moderate volume ascites throughout the abdomen and pelvis, overall similar in volume to the prior study. There is new mild enhancement along the right anterior peritoneum in the lower abdomen/pelvis (2-65). There is also mild enhancement along the posterior margin of the pelvic fluid (2-71). There is no free intraperitoneal air. Musculoskeletal: There is marked degenerative change at L4-L5. There is no aggressive  osseous lesion or acute osseous abnormality. IMPRESSION: 1. Overall similar volume of ascites throughout the abdomen; however, there is new enhancement of the peritoneal lining in the lower abdomen/pelvis consistent with peritoneal irritation/inflammation. No evidence of organized or drainable fluid collection in the abdomen or pelvis. 2. Decreased small bowel wall thickening suggests improving enteritis. 3. Bibasilar consolidations likely reflect atelectasis, though infection can not be entirely excluded. 4. Stable adrenal adenoma and Aortic Atherosclerosis (ICD10-I70.0). Electronically Signed   By: Valetta Mole M.D.   On: 11/14/2020 14:15   CT ABDOMEN PELVIS W CONTRAST  Result Date: 11/10/2020 CLINICAL DATA:  Lower abdominal pain since yesterday with nausea and vomiting. EXAM: CT ABDOMEN AND PELVIS WITH CONTRAST TECHNIQUE: Multidetector CT imaging of the abdomen and pelvis was performed using the standard protocol following bolus administration of intravenous contrast. CONTRAST:  20m OMNIPAQUE IOHEXOL 350 MG/ML SOLN COMPARISON:  11/27/2017 FINDINGS: Lower chest: No acute pulmonary findings. Subpleural pulmonary nodule in the right middle lobe is likely a benign lymph node and unchanged since 2016. The heart is normal in size. No pericardial effusion. Aortic  and coronary artery calcifications are noted. Hepatobiliary: No hepatic lesions or intrahepatic biliary dilatation. The gallbladder is unremarkable. No common bile duct dilatation. Pancreas: No mass, inflammation or ductal dilatation. Spleen: Normal size. No focal lesions. Adrenals/Urinary Tract: Small bilateral adrenal gland nodules are stable and likely benign adenomas. The kidneys are unremarkable. No renal lesions, renal calculi or hydronephrosis. The bladder is grossly normal. Stomach/Bowel: The stomach is unremarkable. The duodenum and proximal small bowel demonstrate mild wall thickening. The colon also demonstrates thickening slight enhancement. No mass lesions or obstructive findings. Suspect enterocolitis. Vascular/Lymphatic: Age advanced atherosclerotic calcifications involving the aorta and iliac arteries. The branch vessels are patent. No dissection. The major venous structures are patent. Reproductive: The uterus is small. A calcified fundal fibroid is noted. No adnexal mass. Other: Small to moderate abdominal ascites. No omental disease or peritoneal surface lesions are identified. This is likely related to the enterocolitis. No peritoneal enhancement to suggest peritonitis. Musculoskeletal: Moderate osteopenia. Postoperative changes involving the lumbar spine. No acute bony findings or worrisome bone lesions. IMPRESSION: 1. CT findings suggest enterocolitis. 2. Small to moderate abdominal ascites. 3. Stable small bilateral adrenal gland nodules, likely benign adenomas. 4. Age advanced atherosclerotic calcifications involving the aorta and iliac arteries. Aortic Atherosclerosis (ICD10-I70.0). Electronically Signed   By: PMarijo SanesM.D.   On: 11/10/2020 05:39   [place]otid Bilateral  Result Date: 10/28/2020 CLINICAL DATA:  Previous stroke, recent falls, left-sided weakness EXAM: BILATERAL CAROTID DUPLEX ULTRASOUND TECHNIQUE: GPearline Cablesscale imaging, color Doppler and duplex ultrasound were performed  of bilateral carotid and vertebral arteries in the neck. COMPARISON:  None. FINDINGS: Criteria: Quantification of carotid stenosis is based on velocity parameters that correlate the residual internal carotid diameter with NASCET-based stenosis levels, using the diameter of the distal internal carotid lumen as the denominator for stenosis measurement. The following velocity measurements were obtained: RIGHT ICA: 113/24 cm/sec CCA: [id number]/sec SYSTOLIC ICA/CCA RATIO:  1.2 ECA: 132 cm/sec LEFT ICA: 98/19 cm/sec CCA: [id number]/sec SYSTOLIC ICA/CCA RATIO:  1.1 ECA: 116 cm/sec RIGHT CAROTID ARTERY: Minor echogenic shadowing plaque formation. No hemodynamically significant right ICA stenosis, velocity elevation, or turbulent flow. Degree of narrowing less than 50%. RIGHT VERTEBRAL ARTERY:  Normal antegrade flow LEFT CAROTID ARTERY: Similar scattered minor echogenic plaque formation. No hemodynamically significant left ICA stenosis, velocity elevation, or turbulent flow. LEFT VERTEBRAL ARTERY:  Normal antegrade flow IMPRESSION: Minor carotid atherosclerosis. Negative  for stenosis. Degree of narrowing less than 50% bilaterally by ultrasound criteria. Patent antegrade vertebral flow bilaterally Electronically Signed   By: Jerilynn Mages.  Shick M.D.   On: 10/28/2020 11:38   ECHOCARDIOGRAM COMPLETE  Result Date: 10/25/2020    ECHOCARDIOGRAM REPORT   Patient Name:   GODDESS DERR Date of Exam: 10/25/2020 Medical Rec #:  [id number]        Height:       64.0 in Accession #:    [id number]       Weight:       136.0 lb Date of Birth:  12-19-1950        BSA:          1.661 m Patient Age:    70 years         BP:           145/99 mmHg Patient Gender: F                HR:           82 bpm. Exam Location:  [facility] Procedure: 2D Echo, Cardiac Doppler, Color Doppler and Intracardiac            Opacification Agent Indications:    CVA  History:        Patient has no prior history of Echocardiogram examinations.                 Stroke and COPD;  Risk Factors:Diabetes, Current Smoker and                 Hypertension.  Sonographer:    Dustin Flock RDCS Referring Phys: [id number] ASIA B [place]  Sonographer Comments: Technically difficult study due to poor echo windows. Image acquisition challenging due to COPD. IMPRESSIONS  1. Left ventricular ejection fraction, by estimation, is 60 to 65%. The left ventricle has normal function. The left ventricle has no regional wall motion abnormalities. Left ventricular diastolic parameters are indeterminate.  2. Right ventricular systolic function is normal. The right ventricular size is normal. Tricuspid regurgitation signal is inadequate for assessing PA pressure.  3. The mitral valve is normal in structure. No evidence of mitral valve regurgitation. No evidence of mitral stenosis.  4. The aortic valve was not well visualized. Aortic valve regurgitation is not visualized. No aortic stenosis is present.  5. 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 FINDINGS  Left Ventricle: Left ventricular ejection fraction, by estimation, is 60 to 65%. The left ventricle has normal function. The left ventricle has no regional wall motion abnormalities. Definity contrast agent was given IV to delineate the left ventricular  endocardial borders. The left ventricular internal cavity size was normal in size. There is no left ventricular hypertrophy. Left ventricular diastolic parameters are indeterminate. Right Ventricle: The right ventricular size is normal. No increase in right ventricular wall thickness. Right ventricular systolic function is normal. Tricuspid regurgitation signal is inadequate for assessing PA pressure. Left Atrium: Left atrial size was normal in size. Right Atrium: Right atrial size was not well visualized. Pericardium: There is no evidence of pericardial effusion. Mitral Valve: The mitral valve is normal in structure. No evidence of mitral valve regurgitation.  No evidence of mitral valve stenosis. Tricuspid Valve: The tricuspid valve is not well visualized. Tricuspid valve regurgitation is not demonstrated. No evidence of tricuspid stenosis. Aortic Valve: The aortic valve was not well visualized. Aortic valve regurgitation is not visualized. No aortic stenosis is present. Aortic valve  mean gradient measures 1.7 mmHg. Aortic valve peak gradient measures 2.9 mmHg. Aortic valve area, by VTI measures 2.92 cm. Pulmonic Valve: The pulmonic valve was not well visualized. Pulmonic valve regurgitation is not visualized. No evidence of pulmonic stenosis. Aorta: The aortic root is normal in size and structure. 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  LEFT VENTRICLE PLAX 2D LVIDd:         3.57 cm  Diastology LVIDs:         2.19 cm  LV e' medial:    4.12 cm/s LV PW:         0.98 cm  LV E/e' medial:  11.8 LV IVS:        0.96 cm  LV e' lateral:   7.36 cm/s LVOT diam:     2.00 cm  LV E/e' lateral: 6.6 LV SV:         50 LV SV Index:   30 LVOT Area:     3.14 cm  RIGHT VENTRICLE RV Basal diam:  3.20 cm TAPSE (M-mode): 2.8 cm LEFT ATRIUM             Index       RIGHT ATRIUM           Index LA diam:        2.40 cm 1.45 cm/m  RA Area:     10.00 cm LA Vol (A2C):   27.4 ml 16.50 ml/m RA Volume:   20.10 ml  12.10 ml/m LA Vol (A4C):   36.1 ml 21.74 ml/m LA Biplane Vol: 33.4 ml 20.11 ml/m  AORTIC VALVE AV Area (Vmax):    2.99 cm AV Area (Vmean):   2.66 cm AV Area (VTI):     2.92 cm AV Vmax:           84.55 cm/s AV Vmean:          63.507 cm/s AV VTI:            0.170 m AV Peak Grad:      2.9 mmHg AV Mean Grad:      1.7 mmHg LVOT Vmax:         80.50 cm/s LVOT Vmean:        53.800 cm/s LVOT VTI:          0.158 m LVOT/AV VTI ratio: 0.93  AORTA Ao Root diam: 2.60 cm MITRAL VALVE MV Area (PHT): 3.50 cm    SHUNTS MV Decel Time: 217 msec    Systemic VTI:  0.16 m MV E velocity: 48.50 cm/s  Systemic Diam: 2.00 cm MV A velocity: 59.70  cm/s MV E/A ratio:  0.81 Carlyle Dolly MD Electronically signed by Carlyle Dolly MD Signature Date/Time: 10/25/2020/12:18:12 PM    Final      Subjective: Pt reports that she is feeling much better.   Discharge Exam: Vitals:   11/21/20 0920 11/21/20 1403  BP: (!) 142/65 132/69  Pulse:  96  Resp:  18  Temp:  98.6 F (37 C)  SpO2:  100%   Vitals:   11/20/20 2059 11/21/20 0416 11/21/20 0920 11/21/20 1403  BP: 127/60 (!) 146/64 (!) 142/65 132/69  Pulse: 83 88  96  Resp: '18 18  18  '$ Temp: 98.4 F (36.9 C)   98.6 F (37 C)  TempSrc:      SpO2: 95% 92%  100%  Weight:  65.2 kg    Height:       General exam:  Appears calm and comfortable NAD Respiratory system: Clear to auscultation. Respiratory effort normal. Cardiovascular system: normal S1 & S2 heard.  Gastrointestinal system: Abdominal pain is resolved. Normal BS. No HSM.  Central nervous system: Alert and awake Extremities: No edema Skin: No significant lesions noted Psychiatry: normal affect.   The results of significant diagnostics from this hospitalization (including imaging, microbiology, ancillary and laboratory) are listed below for reference.     Microbiology: Recent Results (from the past 240 hour(s))  Gastrointestinal Panel by PCR , Stool     Status: None   Collection Time: 11/13/20  3:00 PM   Specimen: Stool  Result Value Ref Range Status   Campylobacter species NOT DETECTED NOT DETECTED Final   Plesimonas shigelloides NOT DETECTED NOT DETECTED Final   Salmonella species NOT DETECTED NOT DETECTED Final   Yersinia enterocolitica NOT DETECTED NOT DETECTED Final   Vibrio species NOT DETECTED NOT DETECTED Final   Vibrio cholerae NOT DETECTED NOT DETECTED Final   Enteroaggregative E coli (EAEC) NOT DETECTED NOT DETECTED Final   Enteropathogenic E coli (EPEC) NOT DETECTED NOT DETECTED Final   Enterotoxigenic E coli (ETEC) NOT DETECTED NOT DETECTED Final   Shiga like toxin producing E coli (STEC) NOT DETECTED NOT  DETECTED Final   Shigella/Enteroinvasive E coli (EIEC) NOT DETECTED NOT DETECTED Final   Cryptosporidium NOT DETECTED NOT DETECTED Final   Cyclospora cayetanensis NOT DETECTED NOT DETECTED Final   Entamoeba histolytica NOT DETECTED NOT DETECTED Final   Giardia lamblia NOT DETECTED NOT DETECTED Final   Adenovirus F40/41 NOT DETECTED NOT DETECTED Final   Astrovirus NOT DETECTED NOT DETECTED Final   Norovirus GI/GII NOT DETECTED NOT DETECTED Final   Rotavirus A NOT DETECTED NOT DETECTED Final   Sapovirus (I, II, IV, and V) NOT DETECTED NOT DETECTED Final    Comment: Performed at [facility], [address] [place], [place] [address]  C Difficile Quick Screen (NO PCR Reflex)     Status: None   Collection Time: 11/13/20  3:00 PM   Specimen: Stool  Result Value Ref Range Status   C Diff antigen NEGATIVE NEGATIVE Final   C Diff toxin NEGATIVE NEGATIVE Final   C Diff interpretation No C. difficile detected.  Final    Comment: Performed at [facility], [address] [place], [place] [address]     Labs: BNP (last 3 results) No results for input(s): BNP in the last 8760 hours. Basic Metabolic Panel: Recent Labs  Lab 11/16/20 0503 11/17/20 0506 11/18/20 0531 11/19/20 0447 11/20/20 0439  NA 139 134* 137 134* 134*  K 2.9* 3.2* 2.5* 2.7* 3.6  CL 108 101 102 102 105  CO2 23 21* '24 22 25  '$ GLUCOSE 60* 262* 161* 138* 169*  BUN '15 12 14 11 10  '$ CREATININE 0.87 1.14* 0.79 0.69 0.61  CALCIUM 8.1* 8.3* 8.4* 8.1* 7.5*  MG 2.0  --  1.8 2.3  --    Liver Function Tests: Recent Labs  Lab 11/19/20 0447  AST 16  ALT 15  ALKPHOS 54  BILITOT 0.5  PROT 5.7*  ALBUMIN 2.1*   No results for input(s): LIPASE, AMYLASE in the last 168 hours. No results for input(s): AMMONIA in the last 168 hours. CBC: Recent Labs  Lab 11/15/20 0608 11/16/20 0503 11/18/20 0531 11/19/20 0447  WBC 10.6* 8.5 11.6* 9.0  NEUTROABS  --   --   --  7.6  HGB 12.5 12.4 13.1 11.4*  HCT 38.3 37.5 39.5  34.4*  MCV  87.6 87.6 88.2 88.0  PLT 358 357 411* 357   Cardiac Enzymes: No results for input(s): CKTOTAL, CKMB, CKMBINDEX, TROPONINI in the last 168 hours. BNP: Invalid input(s): POCBNP CBG: Recent Labs  Lab 11/20/20 1101 11/20/20 1622 11/20/20 2100 11/21/20 0727 11/21/20 1106  GLUCAP 266* 209* 171* 197* 318*   D-Dimer No results for input(s): DDIMER in the last 72 hours. Hgb A1c No results for input(s): HGBA1C in the last 72 hours. Lipid Profile No results for input(s): CHOL, HDL, LDLCALC, TRIG, CHOLHDL, LDLDIRECT in the last 72 hours. Thyroid function studies No results for input(s): TSH, T4TOTAL, T3FREE, THYROIDAB in the last 72 hours.  Invalid input(s): FREET3 Anemia work up No results for input(s): VITAMINB12, FOLATE, FERRITIN, TIBC, IRON, RETICCTPCT in the last 72 hours. Urinalysis    Component Value Date/Time   COLORURINE YELLOW 11/10/2020 1440   APPEARANCEUR CLEAR 11/10/2020 1440   APPEARANCEUR Clear 03/20/2014 2149   LABSPEC 1.010 11/10/2020 1440   LABSPEC 1.005 03/20/2014 2149   PHURINE 5.5 11/10/2020 1440   GLUCOSEU >=500 (A) 11/10/2020 1440   GLUCOSEU Negative 03/20/2014 2149   HGBUR NEGATIVE 11/10/2020 1440   BILIRUBINUR NEGATIVE 11/10/2020 1440   BILIRUBINUR Negative 03/20/2014 2149   KETONESUR NEGATIVE 11/10/2020 1440   PROTEINUR NEGATIVE 11/10/2020 1440   NITRITE NEGATIVE 11/10/2020 1440   LEUKOCYTESUR NEGATIVE 11/10/2020 1440   LEUKOCYTESUR Negative 03/20/2014 2149   Sepsis Labs Invalid input(s): PROCALCITONIN,  WBC,  LACTICIDVEN Microbiology Recent Results (from the past 240 hour(s))  Gastrointestinal Panel by PCR , Stool     Status: None   Collection Time: 11/13/20  3:00 PM   Specimen: Stool  Result Value Ref Range Status   Campylobacter species NOT DETECTED NOT DETECTED Final   Plesimonas shigelloides NOT DETECTED NOT DETECTED Final   Salmonella species NOT DETECTED NOT DETECTED Final   Yersinia enterocolitica NOT DETECTED NOT DETECTED  Final   Vibrio species NOT DETECTED NOT DETECTED Final   Vibrio cholerae NOT DETECTED NOT DETECTED Final   Enteroaggregative E coli (EAEC) NOT DETECTED NOT DETECTED Final   Enteropathogenic E coli (EPEC) NOT DETECTED NOT DETECTED Final   Enterotoxigenic E coli (ETEC) NOT DETECTED NOT DETECTED Final   Shiga like toxin producing E coli (STEC) NOT DETECTED NOT DETECTED Final   Shigella/Enteroinvasive E coli (EIEC) NOT DETECTED NOT DETECTED Final   Cryptosporidium NOT DETECTED NOT DETECTED Final   Cyclospora cayetanensis NOT DETECTED NOT DETECTED Final   Entamoeba histolytica NOT DETECTED NOT DETECTED Final   Giardia lamblia NOT DETECTED NOT DETECTED Final   Adenovirus F40/41 NOT DETECTED NOT DETECTED Final   Astrovirus NOT DETECTED NOT DETECTED Final   Norovirus GI/GII NOT DETECTED NOT DETECTED Final   Rotavirus A NOT DETECTED NOT DETECTED Final   Sapovirus (I, II, IV, and V) NOT DETECTED NOT DETECTED Final    Comment: Performed at [facility], [address] [place], [place] [address]  C Difficile Quick Screen (NO PCR Reflex)     Status: None   Collection Time: 11/13/20  3:00 PM   Specimen: Stool  Result Value Ref Range Status   C Diff antigen NEGATIVE NEGATIVE Final   C Diff toxin NEGATIVE NEGATIVE Final   C Diff interpretation No C. difficile detected.  Final    Comment: Performed at [facility], [address], [place], [place] [address]    Time coordinating discharge: 38 mins   SIGNED:  Irwin Brakeman, MD  [facility] 11/21/2020, 3:44 PM How to contact the [facility] Attending  or Consulting provider Ollie or covering provider during after hours [place], for this patient?  Check the care team in [facility] and look for a) attending/consulting [facility] provider listed and b) the [facility] team listed Log into [email or web address] and use [facility]'s universal password to access. If you do not have the password, please contact the hospital operator. Locate the [facility] provider you are looking  for under [facility] and page to a number that you can be directly reached. If you still have difficulty reaching the provider, please page the [facility] (Director on Call) for the Hospitalists listed on amion for assistance.

## 2020-11-22 DIAGNOSIS — I10 Essential (primary) hypertension: Secondary | ICD-10-CM | POA: Diagnosis not present

## 2020-11-22 DIAGNOSIS — K529 Noninfective gastroenteritis and colitis, unspecified: Secondary | ICD-10-CM | POA: Diagnosis not present

## 2020-11-22 DIAGNOSIS — N179 Acute kidney failure, unspecified: Secondary | ICD-10-CM | POA: Diagnosis not present

## 2020-11-22 LAB — GLUCOSE, CAPILLARY
Glucose-Capillary: 186 mg/dL — ABNORMAL HIGH (ref 70–99)
Glucose-Capillary: 263 mg/dL — ABNORMAL HIGH (ref 70–99)
Glucose-Capillary: 266 mg/dL — ABNORMAL HIGH (ref 70–99)
Glucose-Capillary: 187 mg/dL — ABNORMAL HIGH (ref 70–99)

## 2020-11-22 LAB — COMPREHENSIVE METABOLIC PANEL
ALT: 21 U/L (ref 0–44)
AST: 22 U/L (ref 15–41)
Albumin: 2.1 g/dL — ABNORMAL LOW (ref 3.5–5.0)
Alkaline Phosphatase: 61 U/L (ref 38–126)
Anion gap: 7 (ref 5–15)
BUN: 9 mg/dL (ref 8–23)
CO2: 26 mmol/L (ref 22–32)
Calcium: 8.2 mg/dL — ABNORMAL LOW (ref 8.9–10.3)
Chloride: 104 mmol/L (ref 98–111)
Creatinine, Ser: 0.59 mg/dL (ref 0.44–1.00)
GFR, Estimated: >60 mL/min (ref >60)
Glucose, Bld: 199 mg/dL — ABNORMAL HIGH (ref 70–99)
Potassium: 3.6 mmol/L (ref 3.5–5.1)
Sodium: 137 mmol/L (ref 135–145)
Total Bilirubin: 0.4 mg/dL (ref 0.3–1.2)
Total Protein: 5.8 g/dL — ABNORMAL LOW (ref 6.5–8.1)

## 2020-11-22 LAB — MAGNESIUM: Magnesium: 1.8 mg/dL (ref 1.7–2.4)

## 2020-11-22 MED ORDER — MAGNESIUM SULFATE 2 GM/50ML IV SOLN
2.0000 g | Freq: Once | INTRAVENOUS | Status: AC
  Administered 2020-11-22: 2 g via INTRAVENOUS
  Filled 2020-11-22: qty 50

## 2020-11-22 NOTE — Progress Notes (Signed)
Patient seen and evaluated. Labs reviewed See progress noted dated today for details. Patient remains stable for d/c to SNF  Shanon Brow Yaniel Limbaugh, DO

## 2020-11-22 NOTE — Progress Notes (Signed)
Called report to [facility] rehab Palmer RN.

## 2020-11-22 NOTE — Progress Notes (Signed)
PROGRESS NOTE  Cheyenne [name] MIW:[id number] DOB: Feb 01, 1951 DOA: 11/10/2020 PCP: Toni Arthurs, PA  Brief History:  70 y.o. female with medical history significant for type II DM, CVA, hypertension, hyperlipidemia, COPD, tobacco use who presents to the emergency department due to 2 day onset of constant lower abdominal pain.  He was associated with nausea and vomiting, there was no known alleviating factor and the pain was severe and was described as sharp and crampy in nature.  She denies fever, chills, diarrhea, constipation.  Last bowel movement was 2 to 3 days ago and was normal per patient.  She presented to the emergency department yesterday in the evening, but she left due to long wait in the ED.  She returned this morning with EMS due to persistent and worsening of the abdominal pain.   ED Course:  In the emergency department, tachypneic and tachycardic.  Work-up in the emergency department showed leukocytosis, BUN to creatinine 22/1.46 (baseline creatinine is 0.7-0.9).  Lipase 20 CT abdomen and pelvis with contrast showed enterocolitis, small to moderate abdominal ascites. She was treated with IV morphine 4 mg x 1, IV Zofran was given, she was started on IV hydration and IV Zosyn was given.  Hospitalist was asked to admit patient for further evaluation and management.  Assessment/Plan: Sepsis present on admission secondary to enterocolitis-RESOLVED -Patient patient still not tolerating much overnight -Repeat CT abdomen/pelvis with contrast on 9/12 with findings of focal peritoneal irritation, but enteritis appears to be improving -treated with IV Zosyn and transitioned to oral augmentin which is completed.  -Blood cultures with no growth -leukocytosis has RESOLVED.  -sepsis physiology resolved   Persistent abdominal pain - IMPROVING to nearly resolved  - pain seems out of proportion to labs and CT findings - RESOLVED - I have asked for GI consultation to assist with  eval and diagnosis, management options - COMPLETED course of antibiotics, GI planning outpatient colonoscopy that they will arrange -pain overall improved and tolerating diet   Prerenal AKI - RESOLVED  -Baseline creatinine 0.7-0.9 -Reduced IV fluid hydration -Hold losartan and chlorthalidone -Monitor strict I's and O's, currently nothing recorded -serum creatinine 0.59 on day of d/c   Hypokalemia - REPLETED -Replete and reevaluate in a.m.  Additional oral replacement ordered.  -Likely related to recent diarrhea with C. difficile negative and GI panel negative   Hyperglycemia secondary to type 2 diabetes - resume home treatment plan - please check CBG 4-5 times per day    Essential hypertension -resume amlodipine -DC chlorthalidone   History of CVA/dyslipidemia -Lipitor -Continue aspirin   Restless leg syndrome -Requip ordered    History of COPD with ongoing tobacco abuse -Counseled on cessation -Albuterol as needed for shortness of breath or wheezing -stable on RA      Status is: Inpatient  Remains inpatient appropriate because:Inpatient level of care appropriate due to severity of illness  Dispo: The patient is from: Home              Anticipated d/c is to: SNF              Patient currently is medically stable to d/c.   Difficult to place patient No        Family Communication:   no Family at bedside  Consultants:  GI  Code Status:  FULL   DVT Prophylaxis:  [place] Lovenox   Procedures: As Listed in Progress Note Above  Antibiotics: None  Subjective: Patient states abd pain is improved.  Tolerating diet.  Eating at least 50% of meals and drinking supplements.  Denies f/c, cp, sob, n/v/d, abd pain  Objective: Vitals:   11/21/20 2050 11/22/20 0000 11/22/20 0358 11/22/20 0839  BP: (!) 145/76 (!) 116/58 (!) 150/72 128/63  Pulse: 91 89 85   Resp: 18 18 18    Temp: 98 F (36.7 C) 98.9 F (37.2 C) 98.8 F (37.1 C)   TempSrc:  Oral     SpO2: 94% 95% 97%   Weight:   63.6 kg   Height:        Intake/Output Summary (Last 24 hours) at 11/22/2020 1016 Last data filed at 11/22/2020 0900 Gross per 24 hour  Intake 1189.33 ml  Output --  Net 1189.33 ml   Weight change: -1.606 kg Exam:  General:  Pt is alert, follows commands appropriately, not in acute distress HEENT: No icterus, No thrush, No neck mass, [place]/AT Cardiovascular: RRR, S1/S2, no rubs, no gallops Respiratory: fine bibasilar crackles. No wheeze Abdomen: Soft/+BS, non tender, non distended, no guarding Extremities: No edema, No lymphangitis, No petechiae, No rashes, no synovitis   Data Reviewed: I have personally reviewed following labs and imaging studies Basic Metabolic Panel: Recent Labs  Lab 11/16/20 0503 11/17/20 0506 11/18/20 0531 11/19/20 0447 11/20/20 0439 11/22/20 0507  NA 139 134* 137 134* 134* 137  K 2.9* 3.2* 2.5* 2.7* 3.6 3.6  CL 108 101 102 102 105 104  CO2 23 21* 24 22 25 26   GLUCOSE 60* 262* 161* 138* 169* 199*  BUN 15 12 14 11 10 9   CREATININE 0.87 1.14* 0.79 0.69 0.61 0.59  CALCIUM 8.1* 8.3* 8.4* 8.1* 7.5* 8.2*  MG 2.0  --  1.8 2.3  --  1.8   Liver Function Tests: Recent Labs  Lab 11/19/20 0447 11/22/20 0507  AST 16 22  ALT 15 21  ALKPHOS 54 61  BILITOT 0.5 0.4  PROT 5.7* 5.8*  ALBUMIN 2.1* 2.1*   No results for input(s): LIPASE, AMYLASE in the last 168 hours. No results for input(s): AMMONIA in the last 168 hours. Coagulation Profile: No results for input(s): INR, PROTIME in the last 168 hours. CBC: Recent Labs  Lab 11/16/20 0503 11/18/20 0531 11/19/20 0447  WBC 8.5 11.6* 9.0  NEUTROABS  --   --  7.6  HGB 12.4 13.1 11.4*  HCT 37.5 39.5 34.4*  MCV 87.6 88.2 88.0  PLT 357 411* 357   Cardiac Enzymes: No results for input(s): CKTOTAL, CKMB, CKMBINDEX, TROPONINI in the last 168 hours. BNP: Invalid input(s): POCBNP CBG: Recent Labs  Lab 11/21/20 0727 11/21/20 1106 11/21/20 1615 11/21/20 2050  11/22/20 0706  GLUCAP 197* 318* 196* 201* 186*   HbA1C: No results for input(s): HGBA1C in the last 72 hours. Urine analysis:    Component Value Date/Time   COLORURINE YELLOW 11/10/2020 1440   APPEARANCEUR CLEAR 11/10/2020 1440   APPEARANCEUR Clear 03/20/2014 2149   LABSPEC 1.010 11/10/2020 1440   LABSPEC 1.005 03/20/2014 2149   PHURINE 5.5 11/10/2020 1440   GLUCOSEU >=500 (A) 11/10/2020 1440   GLUCOSEU Negative 03/20/2014 2149   HGBUR NEGATIVE 11/10/2020 1440   BILIRUBINUR NEGATIVE 11/10/2020 1440   BILIRUBINUR Negative 03/20/2014 2149   KETONESUR NEGATIVE 11/10/2020 1440   PROTEINUR NEGATIVE 11/10/2020 1440   NITRITE NEGATIVE 11/10/2020 1440   LEUKOCYTESUR NEGATIVE 11/10/2020 1440   LEUKOCYTESUR Negative 03/20/2014 2149   Sepsis Labs: @LABRCNTIP (procalcitonin:4,lacticidven:4) ) Recent Results (from the past 240 hour(s))  Gastrointestinal Panel  by PCR , Stool     Status: None   Collection Time: 11/13/20  3:00 PM   Specimen: Stool  Result Value Ref Range Status   Campylobacter species NOT DETECTED NOT DETECTED Final   Plesimonas shigelloides NOT DETECTED NOT DETECTED Final   Salmonella species NOT DETECTED NOT DETECTED Final   Yersinia enterocolitica NOT DETECTED NOT DETECTED Final   Vibrio species NOT DETECTED NOT DETECTED Final   Vibrio cholerae NOT DETECTED NOT DETECTED Final   Enteroaggregative E coli (EAEC) NOT DETECTED NOT DETECTED Final   Enteropathogenic E coli (EPEC) NOT DETECTED NOT DETECTED Final   Enterotoxigenic E coli (ETEC) NOT DETECTED NOT DETECTED Final   Shiga like toxin producing E coli (STEC) NOT DETECTED NOT DETECTED Final   Shigella/Enteroinvasive E coli (EIEC) NOT DETECTED NOT DETECTED Final   Cryptosporidium NOT DETECTED NOT DETECTED Final   Cyclospora cayetanensis NOT DETECTED NOT DETECTED Final   Entamoeba histolytica NOT DETECTED NOT DETECTED Final   Giardia lamblia NOT DETECTED NOT DETECTED Final   Adenovirus F40/41 NOT DETECTED NOT  DETECTED Final   Astrovirus NOT DETECTED NOT DETECTED Final   Norovirus GI/GII NOT DETECTED NOT DETECTED Final   Rotavirus A NOT DETECTED NOT DETECTED Final   Sapovirus (I, II, IV, and V) NOT DETECTED NOT DETECTED Final    Comment: Performed at [facility], [address] [place], [place] [address]  C Difficile Quick Screen (NO PCR Reflex)     Status: None   Collection Time: 11/13/20  3:00 PM   Specimen: Stool  Result Value Ref Range Status   C Diff antigen NEGATIVE NEGATIVE Final   C Diff toxin NEGATIVE NEGATIVE Final   C Diff interpretation No C. difficile detected.  Final    Comment: Performed at [facility], [address], [place], [place] [address]  Resp Panel by RT-PCR (Flu A&B, Covid) Nasopharyngeal Swab     Status: None   Collection Time: 11/21/20  3:48 PM   Specimen: Nasopharyngeal Swab; Nasopharyngeal(NP) swabs in vial transport medium  Result Value Ref Range Status   SARS Coronavirus 2 by RT PCR NEGATIVE NEGATIVE Final    Comment: (NOTE) SARS-CoV-2 target nucleic acids are NOT DETECTED.  The SARS-CoV-2 RNA is generally detectable in upper respiratory specimens during the acute phase of infection. The lowest concentration of SARS-CoV-2 viral copies this assay can detect is 138 copies/mL. A negative result does not preclude SARS-Cov-2 infection and should not be used as the sole basis for treatment or other patient management decisions. A negative result may occur with  improper specimen collection/handling, submission of specimen other than nasopharyngeal swab, presence of viral mutation(s) within the areas targeted by this assay, and inadequate number of viral copies(<138 copies/mL). A negative result must be combined with clinical observations, patient history, and epidemiological information. The expected result is Negative.  Fact Sheet for Patients:  EntrepreneurPulse.com.au  Fact Sheet for Healthcare Providers:   IncredibleEmployment.be  This test is no t yet approved or cleared by the [place] FDA and  has been authorized for detection and/or diagnosis of SARS-CoV-2 by FDA under an Emergency Use Authorization (EUA). This EUA will remain  in effect (meaning this test can be used) for the duration of the COVID-19 declaration under Section 564(b)(1) of the Act, 21 U.S.C.section 360bbb-3(b)(1), unless the authorization is terminated  or revoked sooner.       Influenza A by PCR NEGATIVE NEGATIVE Final   Influenza B by PCR NEGATIVE NEGATIVE Final  Comment: (NOTE) The Xpert Xpress SARS-CoV-2/FLU/RSV plus assay is intended as an aid in the diagnosis of influenza from Nasopharyngeal swab specimens and should not be used as a sole basis for treatment. Nasal washings and aspirates are unacceptable for Xpert Xpress SARS-CoV-2/FLU/RSV testing.  Fact Sheet for Patients: EntrepreneurPulse.com.au  Fact Sheet for Healthcare Providers: IncredibleEmployment.be  This test is not yet approved or cleared by the [place] FDA and has been authorized for detection and/or diagnosis of SARS-CoV-2 by FDA under an Emergency Use Authorization (EUA). This EUA will remain in effect (meaning this test can be used) for the duration of the COVID-19 declaration under Section 564(b)(1) of the Act, 21 U.S.C. section 360bbb-3(b)(1), unless the authorization is terminated or revoked.  Performed at [facility], [address], [place], [place] [address]      Scheduled Meds:  amLODipine  10 mg Oral Daily   aspirin EC  81 mg Oral Daily   atorvastatin  80 mg Oral Daily   Chlorhexidine Gluconate Cloth  6 each Topical Daily   cholecalciferol  1,000 Units Oral Daily   clopidogrel  75 mg Oral Q breakfast   enoxaparin (LOVENOX) injection  40 mg Subcutaneous Q24H   escitalopram  10 mg Oral Daily   feeding supplement (GLUCERNA SHAKE)  237 mL Oral TID BM    insulin aspart  0-6 Units Subcutaneous TID WC   insulin aspart  3 Units Subcutaneous TID WC   insulin detemir  8 Units Subcutaneous Daily   loratadine  10 mg Oral Daily   multivitamin with minerals  1 tablet Oral Daily   Ensure Max Protein  11 oz Oral Daily   rOPINIRole  0.25 mg Oral QHS   saccharomyces boulardii  250 mg Oral BID   Continuous Infusions:  lactated ringers 35 mL/hr at 11/21/20 0644   magnesium sulfate bolus IVPB      Procedures/Studies: DG Ribs Unilateral W/Chest Left  Result Date: 10/24/2020 CLINICAL DATA:  Fall and left rib pain. EXAM: LEFT RIBS AND CHEST - 3+ VIEW COMPARISON:  Chest radiograph dated 11/04/2012. FINDINGS: There is diffuse chronic interstitial coarsening. No focal consolidation, pleural effusion, or pneumothorax. The cardiac silhouette is within limits. Osteopenia with degenerative changes of the spine. No acute osseous pathology. No displaced rib fractures. IMPRESSION: 1. No acute cardiopulmonary process. 2. No displaced rib fractures. Electronically Signed   By: Anner Crete M.D.   On: 10/24/2020 22:36   DG Lumbar Spine Complete  Result Date: 10/24/2020 CLINICAL DATA:  Fall, left lower rib pain, low back pain EXAM: LUMBAR SPINE - COMPLETE 4+ VIEW COMPARISON:  None. FINDINGS: Advanced degenerative disc disease changes at L4-5 with disc space loss, vacuum disc and spurring. Degenerative facet disease in the lumbar spine. Normal alignment. No fracture. Diffuse osteopenia. IMPRESSION: No acute bony abnormality. Electronically Signed   By: Rolm Baptise M.D.   On: 10/24/2020 22:34   CT HEAD WO CONTRAST (5MM)  Result Date: 10/30/2020 CLINICAL DATA:  Encephalopathy EXAM: CT HEAD WITHOUT CONTRAST TECHNIQUE: Contiguous axial images were obtained from the base of the skull through the vertex without intravenous contrast. COMPARISON:  Head CT 10/24/2020 and brain MRI 10/25/2020 FINDINGS: Brain: There is no mass, hemorrhage or extra-axial collection. There is  generalized atrophy without lobar predilection. Hypodensity of the white matter is most commonly associated with chronic microvascular disease. Focal hypoattenuation at the site of known posterior right frontal lesion. Vascular: No abnormal hyperdensity of the major intracranial arteries or dural venous sinuses. No intracranial atherosclerosis. Skull:  The visualized skull base, calvarium and extracranial soft tissues are normal. Sinuses/Orbits: Moderate sphenoid sinus mucosal thickening. The orbits are normal. IMPRESSION: 1. No acute intracranial abnormality. 2. Focal hypoattenuation at the site of known posterior right frontal lesion, likely subacute infarct. Electronically Signed   By: Ulyses Jarred M.D.   On: 10/30/2020 19:16   CT Head Wo Contrast  Result Date: 10/24/2020 CLINICAL DATA:  Head trauma, minor (Age >= 65y) recent stroke affecting L arm/leg at [facility], now falling EXAM: CT HEAD WITHOUT CONTRAST TECHNIQUE: Contiguous axial images were obtained from the base of the skull through the vertex without intravenous contrast. COMPARISON:  None. FINDINGS: Brain: Patchy low-density throughout the deep white matter, likely chronic small vessel disease. No acute intracranial abnormality. Specifically, no hemorrhage, hydrocephalus, mass lesion, acute infarction, or significant intracranial injury. Vascular: No hyperdense vessel or unexpected calcification. Skull: No acute calvarial abnormality. Sinuses/Orbits: Mucosal thickening throughout the paranasal sinuses, most pronounced in the sphenoid sinuses. Other: None IMPRESSION: Chronic small vessel disease. No acute intracranial abnormality. Chronic sinusitis. Electronically Signed   By: Rolm Baptise M.D.   On: 10/24/2020 22:27   MR ANGIO HEAD WO CONTRAST  Result Date: 10/25/2020 CLINICAL DATA:  Neuro deficit, acute, stroke suspected EXAM: MRI HEAD WITHOUT CONTRAST MRA HEAD WITHOUT CONTRAST TECHNIQUE: Multiplanar, multi-echo pulse sequences of the brain and  surrounding structures were acquired without intravenous contrast. Angiographic images of the Circle of Willis were acquired using MRA technique without intravenous contrast. COMPARISON:  No pertinent prior exam. FINDINGS: MRI HEAD FINDINGS Brain: Approximately 1.3 cm focus of peripheral restricted diffusion in the right posterior frontal white matter, most consistent with acute infarct given the patient's risk factors and other findings of chronic microvascular disease. Additional punctate areas of infarct in the more anterior frontal white matter. Moderate to advanced patchy and T2 hyperintensity in the supratentorial white matter. There are numerous small foci of susceptibility artifact throughout the supratentorial brain, predominantly cortical in location and compatible with prior microhemorrhages. No evidence of acute hemorrhage, mass lesion, hydrocephalus, or extra-axial fluid collection. Vascular: See below. Skull and upper cervical spine: Normal marrow signal. Sinuses/Orbits: Moderate mucosal thickening in the right sphenoid sinus with air-fluid level. Other: No sizable mastoid effusions. MRA HEAD FINDINGS Anterior circulation: Bilateral intracranial ICAs, MCAs, and ACAs are patent without proximal hemodynamically significant stenosis. No aneurysm identified. Posterior circulation: Bilateral intradural vertebral arteries, basilar artery, and posterior cerebral arteries are patent. Moderate mid right and severe distal left P2 PCA stenosis. IMPRESSION: MRI: 1. Approximately 1.3 cm acute infarct in the right frontal white matter with surrounding punctate acute frontal infarcts. 2. Moderate to advanced T2/FLAIR hyperintensities the white matter. 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 3. Numerous supratentorial chronic microhemorrhages, potentially related to amyloid  angiopathy given the cortical distribution. These hemorrhages are atypical for hypertensive hemorrhages given relative sparing of the basal ganglia, thalami and cerebellum. 4. Moderate mucosal thickening in the right sphenoid sinus with air-fluid level. MRA: 1. No large vessel occlusion. 2. Motion limited distal evaluation with suspected severe distal left P2 PCA and moderate mid right P2 PCA stenosis. Electronically Signed   By: Margaretha Sheffield M.D.   On: 10/25/2020 12:57   MR BRAIN WO CONTRAST  Result Date: 10/25/2020 CLINICAL DATA:  Neuro deficit, acute, stroke suspected EXAM: MRI HEAD WITHOUT CONTRAST MRA HEAD WITHOUT CONTRAST TECHNIQUE: Multiplanar, multi-echo pulse sequences of the brain and surrounding structures were acquired without intravenous  contrast. Angiographic images of the Circle of Willis were acquired using MRA technique without intravenous contrast. COMPARISON:  No pertinent prior exam. FINDINGS: MRI HEAD FINDINGS Brain: Approximately 1.3 cm focus of peripheral restricted diffusion in the right posterior frontal white matter, most consistent with acute infarct given the patient's risk factors and other findings of chronic microvascular disease. Additional punctate areas of infarct in the more anterior frontal white matter. Moderate to advanced patchy and T2 hyperintensity in the supratentorial white matter. There are numerous small foci of susceptibility artifact throughout the supratentorial brain, predominantly cortical in location and compatible with prior microhemorrhages. No evidence of acute hemorrhage, mass lesion, hydrocephalus, or extra-axial fluid collection. Vascular: See below. Skull and upper cervical spine: Normal marrow signal. Sinuses/Orbits: Moderate mucosal thickening in the right sphenoid sinus with air-fluid level. Other: No sizable mastoid effusions. MRA HEAD FINDINGS Anterior circulation: Bilateral intracranial ICAs, MCAs, and ACAs are patent without proximal  hemodynamically significant stenosis. No aneurysm identified. Posterior circulation: Bilateral intradural vertebral arteries, basilar artery, and posterior cerebral arteries are patent. Moderate mid right and severe distal left P2 PCA stenosis. IMPRESSION: MRI: 1. Approximately 1.3 cm acute infarct in the right frontal white matter with surrounding punctate acute frontal infarcts. 2. Moderate to advanced T2/FLAIR hyperintensities the white matter. 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 3. 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 These hemorrhages are atypical for hypertensive hemorrhages given relative sparing of the basal ganglia, thalami and cerebellum. 4. Moderate mucosal thickening in the right sphenoid sinus with air-fluid level. MRA: 1. No large vessel occlusion. 2. Motion limited distal evaluation with suspected severe distal left P2 PCA and moderate mid right P2 PCA stenosis. Electronically Signed   By: Margaretha Sheffield M.D.   On: 10/25/2020 12:57   CT ABDOMEN PELVIS W CONTRAST  Result Date: 11/14/2020 CLINICAL DATA:  Worsening abdominal pain with nausea, vomiting EXAM: CT ABDOMEN AND PELVIS WITH CONTRAST TECHNIQUE: Multidetector CT imaging of the abdomen and pelvis was performed using the standard protocol following bolus administration of intravenous contrast. CONTRAST:  68mL OMNIPAQUE IOHEXOL 350 MG/ML SOLN COMPARISON:  CT abdomen/pelvis 11/10/2020, 11/27/2017 FINDINGS: Lower chest: There are bibasilar consolidations, increased since 11/10/2020. There is a 7 mm nodule in the subpleural right middle lobe, present since 2016 and likely benign. Hepatobiliary: The liver and gallbladder are normal. There is no biliary ductal dilatation. Pancreas: Unremarkable. Spleen: Unremarkable.  Adrenals/Urinary Tract: A 1.0 cm right adrenal nodule is unchanged. Thickening of the left adrenal is unchanged. Subcentimeter hypodense lesions in the left kidney are too small to characterize, but likely reflect small cysts. There are no other focal lesions. There are no stones. There is no hydronephrosis or hydroureter. The bladder is unremarkable. Stomach/Bowel: The stomach is unremarkable. There is no evidence of bowel obstruction. Previously seen small bowel wall thickening appears improved. There is mild hyperemia of some loops of small bowel in the lower abdomen. The bowel is otherwise unremarkable. Vascular/Lymphatic: There is extensive calcified atherosclerotic plaque throughout the nonaneurysmal abdominal aorta. The major branch vessels are patent. The main portal and splenic veins are patent. There is a 7 mm portacaval lymph node, nonspecific and unchanged. There is no new or progressive lymphadenopathy in the abdomen or pelvis. Reproductive: A calcified fundal fibroid is unchanged. The uterus and adnexa are otherwise unremarkable. Other: There is moderate volume ascites throughout the abdomen and pelvis, overall similar in  volume to the prior study. There is new mild enhancement along the right anterior peritoneum in the lower abdomen/pelvis (2-65). There is also mild enhancement along the posterior margin of the pelvic fluid (2-71). There is no free intraperitoneal air. Musculoskeletal: There is marked degenerative change at L4-L5. There is no aggressive osseous lesion or acute osseous abnormality. IMPRESSION: 1. Overall similar volume of ascites throughout the abdomen; however, there is new enhancement of the peritoneal lining in the lower abdomen/pelvis consistent with peritoneal irritation/inflammation. No evidence of organized or drainable fluid collection in the abdomen or pelvis. 2. Decreased small bowel wall thickening suggests improving enteritis. 3. Bibasilar consolidations likely reflect  atelectasis, though infection can not be entirely excluded. 4. Stable adrenal adenoma and Aortic Atherosclerosis (ICD10-I70.0). Electronically Signed   By: Valetta Mole M.D.   On: 11/14/2020 14:15   CT ABDOMEN PELVIS W CONTRAST  Result Date: 11/10/2020 CLINICAL DATA:  Lower abdominal pain since yesterday with nausea and vomiting. EXAM: CT ABDOMEN AND PELVIS WITH CONTRAST TECHNIQUE: Multidetector CT imaging of the abdomen and pelvis was performed using the standard protocol following bolus administration of intravenous contrast. CONTRAST:  77mL OMNIPAQUE IOHEXOL 350 MG/ML SOLN COMPARISON:  11/27/2017 FINDINGS: Lower chest: No acute pulmonary findings. Subpleural pulmonary nodule in the right middle lobe is likely a benign lymph node and unchanged since 2016. The heart is normal in size. No pericardial effusion. Aortic and coronary artery calcifications are noted. Hepatobiliary: No hepatic lesions or intrahepatic biliary dilatation. The gallbladder is unremarkable. No common bile duct dilatation. Pancreas: No mass, inflammation or ductal dilatation. Spleen: Normal size. No focal lesions. Adrenals/Urinary Tract: Small bilateral adrenal gland nodules are stable and likely benign adenomas. The kidneys are unremarkable. No renal lesions, renal calculi or hydronephrosis. The bladder is grossly normal. Stomach/Bowel: The stomach is unremarkable. The duodenum and proximal small bowel demonstrate mild wall thickening. The colon also demonstrates thickening slight enhancement. No mass lesions or obstructive findings. Suspect enterocolitis. Vascular/Lymphatic: Age advanced atherosclerotic calcifications involving the aorta and iliac arteries. The branch vessels are patent. No dissection. The major venous structures are patent. Reproductive: The uterus is small. A calcified fundal fibroid is noted. No adnexal mass. Other: Small to moderate abdominal ascites. No omental disease or peritoneal surface lesions are identified.  This is likely related to the enterocolitis. No peritoneal enhancement to suggest peritonitis. Musculoskeletal: Moderate osteopenia. Postoperative changes involving the lumbar spine. No acute bony findings or worrisome bone lesions. IMPRESSION: 1. CT findings suggest enterocolitis. 2. Small to moderate abdominal ascites. 3. Stable small bilateral adrenal gland nodules, likely benign adenomas. 4. Age advanced atherosclerotic calcifications involving the aorta and iliac arteries. Aortic Atherosclerosis (ICD10-I70.0). Electronically Signed   By: Marijo Sanes M.D.   On: 11/10/2020 05:39   US Carotid Bilateral  Result Date: 10/28/2020 CLINICAL DATA:  Previous stroke, recent falls, left-sided weakness EXAM: BILATERAL CAROTID DUPLEX ULTRASOUND TECHNIQUE: Pearline Cables scale imaging, color Doppler and duplex ultrasound were performed of bilateral carotid and vertebral arteries in the neck. COMPARISON:  None. FINDINGS: Criteria: Quantification of carotid stenosis is based on velocity parameters that correlate the residual internal carotid diameter with NASCET-based stenosis levels, using the diameter of the distal internal carotid lumen as the denominator for stenosis measurement. The following velocity measurements were obtained: RIGHT ICA: 113/24 cm/sec CCA: 62/1 cm/sec SYSTOLIC ICA/CCA RATIO:  1.2 ECA: 132 cm/sec LEFT ICA: 98/19 cm/sec CCA: 30/86 cm/sec SYSTOLIC ICA/CCA RATIO:  1.1 ECA: 116 cm/sec RIGHT CAROTID ARTERY: Minor echogenic shadowing plaque formation. No hemodynamically significant right  ICA stenosis, velocity elevation, or turbulent flow. Degree of narrowing less than 50%. RIGHT VERTEBRAL ARTERY:  Normal antegrade flow LEFT CAROTID ARTERY: Similar scattered minor echogenic plaque formation. No hemodynamically significant left ICA stenosis, velocity elevation, or turbulent flow. LEFT VERTEBRAL ARTERY:  Normal antegrade flow IMPRESSION: Minor carotid atherosclerosis. Negative for stenosis. Degree of narrowing less  than 50% bilaterally by ultrasound criteria. Patent antegrade vertebral flow bilaterally Electronically Signed   By: Jerilynn Mages.  Shick M.D.   On: 10/28/2020 11:38   ECHOCARDIOGRAM COMPLETE  Result Date: 10/25/2020    ECHOCARDIOGRAM REPORT   Patient Name:   MALIA CORSI Date of Exam: 10/25/2020 Medical Rec #:  [id number]        Height:       64.0 in Accession #:    [id number]       Weight:       136.0 lb Date of Birth:  [birth year]        BSA:          1.661 m Patient Age:    35 years         BP:           145/99 mmHg Patient Gender: F                HR:           82 bpm. Exam Location:  [facility] Procedure: 2D Echo, Cardiac Doppler, Color Doppler and Intracardiac            Opacification Agent Indications:    CVA  History:        Patient has no prior history of Echocardiogram examinations.                 Stroke and COPD; Risk Factors:Diabetes, Current Smoker and                 Hypertension.  Sonographer:    Dustin Flock RDCS Referring Phys: [id number] ASIA B Montgomery  Sonographer Comments: Technically difficult study due to poor echo windows. Image acquisition challenging due to COPD. IMPRESSIONS  1. Left ventricular ejection fraction, by estimation, is 60 to 65%. The left ventricle has normal function. The left ventricle has no regional wall motion abnormalities. Left ventricular diastolic parameters are indeterminate.  2. Right ventricular systolic function is normal. The right ventricular size is normal. Tricuspid regurgitation signal is inadequate for assessing PA pressure.  3. The mitral valve is normal in structure. No evidence of mitral valve regurgitation. No evidence of mitral stenosis.  4. The aortic valve was not well visualized. Aortic valve regurgitation is not visualized. No aortic stenosis is present.  5. 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 FINDINGS  Left Ventricle: Left ventricular ejection fraction, by estimation, is 60 to 65%. The left  ventricle has normal function. The left ventricle has no regional wall motion abnormalities. Definity contrast agent was given IV to delineate the left ventricular  endocardial borders. The left ventricular internal cavity size was normal in size. There is no left ventricular hypertrophy. Left ventricular diastolic parameters are indeterminate. Right Ventricle: The right ventricular size is normal. No increase in right ventricular wall thickness. Right ventricular systolic function is normal. Tricuspid regurgitation signal is inadequate for assessing PA pressure. Left Atrium: Left atrial size was normal in size. Right Atrium: Right atrial size was not well visualized. Pericardium: There is no evidence of pericardial effusion. Mitral Valve: The mitral valve is normal in structure. No evidence  of mitral valve regurgitation. No evidence of mitral valve stenosis. Tricuspid Valve: The tricuspid valve is not well visualized. Tricuspid valve regurgitation is not demonstrated. No evidence of tricuspid stenosis. Aortic Valve: The aortic valve was not well visualized. Aortic valve regurgitation is not visualized. No aortic stenosis is present. Aortic valve mean gradient measures 1.7 mmHg. Aortic valve peak gradient measures 2.9 mmHg. Aortic valve area, by VTI measures 2.92 cm. Pulmonic Valve: The pulmonic valve was not well visualized. Pulmonic valve regurgitation is not visualized. No evidence of pulmonic stenosis. Aorta: The aortic root is normal in size and structure. 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  LEFT VENTRICLE PLAX 2D LVIDd:         3.57 cm  Diastology LVIDs:         2.19 cm  LV e' medial:    4.12 cm/s LV PW:         0.98 cm  LV E/e' medial:  11.8 LV IVS:        0.96 cm  LV e' lateral:   7.36 cm/s LVOT diam:     2.00 cm  LV E/e' lateral: 6.6 LV SV:         50 LV SV Index:   30 LVOT Area:     3.14 cm  RIGHT VENTRICLE RV Basal diam:  3.20 cm TAPSE  (M-mode): 2.8 cm LEFT ATRIUM             Index       RIGHT ATRIUM           Index LA diam:        2.40 cm 1.45 cm/m  RA Area:     10.00 cm LA Vol (A2C):   27.4 ml 16.50 ml/m RA Volume:   20.10 ml  12.10 ml/m LA Vol (A4C):   36.1 ml 21.74 ml/m LA Biplane Vol: 33.4 ml 20.11 ml/m  AORTIC VALVE AV Area (Vmax):    2.99 cm AV Area (Vmean):   2.66 cm AV Area (VTI):     2.92 cm AV Vmax:           84.55 cm/s AV Vmean:          63.507 cm/s AV VTI:            0.170 m AV Peak Grad:      2.9 mmHg AV Mean Grad:      1.7 mmHg LVOT Vmax:         80.50 cm/s LVOT Vmean:        53.800 cm/s LVOT VTI:          0.158 m LVOT/AV VTI ratio: 0.93  AORTA Ao Root diam: 2.60 cm MITRAL VALVE MV Area (PHT): 3.50 cm    SHUNTS MV Decel Time: 217 msec    Systemic VTI:  0.16 m MV E velocity: 48.50 cm/s  Systemic Diam: 2.00 cm MV A velocity: 59.70 cm/s MV E/A ratio:  0.81 Carlyle Dolly MD Electronically signed by Carlyle Dolly MD Signature Date/Time: 10/25/2020/12:18:12 PM    Final     Orson Eva, DO  [facility]  If 7PM-7AM, please contact night-coverage [email or web address] Password [email or web address] 11/22/2020, 10:16 AM   LOS: 12 days

## 2020-11-22 NOTE — TOC Transition Note (Signed)
Transition of Care [facility]) - CM/SW Discharge Note   Patient Details  Name: Cheyenne [name] MRN: [id number] Date of Birth: 01-Feb-1951  Transition of Care [facility] CM/SW Contact:  Ihor Gully, LCSW Phone Number: 11/22/2020, 11:41 AM   Clinical Narrative:    Griffith Citron attending completed peer to peer and patient was authorized for seven days of SNF. Discharge clinicals sent to facility. Granddaughter notified. [facility] to transport. RN to call report. TOC signing off.    Final next level of care: Skilled Nursing Facility Barriers to Discharge: No Barriers Identified   Patient Goals and CMS Choice Patient states their goals for this hospitalization and ongoing recovery are:: agreeable to SNF. CMS Medicare.gov Compare Post Acute Care list provided to:: Patient Represenative (must comment) Choice offered to / list presented to : Adult Children  Discharge Placement                Patient to be transferred to facility by: [facility] Name of family member notified: Lanna Poche, granddaughter Patient and family notified of of transfer: 11/22/20  Discharge Plan and Services                                     Social Determinants of Health (SDOH) Interventions     Readmission Risk Interventions Readmission Risk Prevention Plan 11/14/2020  Transportation Screening Complete  Home Care Screening Complete  Medication Review (RN CM) Complete  Some recent data might be hidden

## 2020-11-22 NOTE — Progress Notes (Signed)
Telemetry notified that patient had 17 beat run of SVT. Vitals checked and WNL, EKG obtained and results in chart. Dr. Clearence Ped notfied and no new orders at this time.

## 2020-11-22 NOTE — Progress Notes (Signed)
Occupational Therapy Treatment Patient Details Name: Cheyenne [name] MRN: [id number] DOB: 19-Nov-1950 Today's Date: 11/22/2020   History of present illness Cheyenne [name] is a 70 y.o. female with medical history significant for type II DM, CVA, hypertension, hyperlipidemia, COPD, tobacco use who presents to the emergency department due to 2 day onset of constant lower abdominal pain.  She was also noted to have some associated nausea and vomiting and was admitted for enterocolitis based on CT findings.  She has been started on IV fluid as well as IV Zosyn.  She was noted to have hyperglycemia secondary to type 2 diabetes which has now resolved.   OT comments  Pt agreeable to OT treatment this date. Pt demonstrates need for Min G assist for sit to stand and Min A for ambulation in room to sink with RW primarily to manage RW safely. Pt able to stand at sink while washing her face with R UE. Pt reported fatigue and ambulated to bed. Pt washed B LE from knee to ankle with Min G to SPV assist using R UE. Pt required min to mod A to don sock of L LE due to need for assist to start sock on the foot. Pt was observed to be able to open a container of deodorant with s/u assist using L UE as support hand. Pt will benefit from continued OT in the hospital and recommended venue below to increase strength, balance, and endurance for safe ADL's.      Recommendations for follow up therapy are one component of a multi-disciplinary discharge planning process, led by the attending physician.  Recommendations may be updated based on patient status, additional functional criteria and insurance authorization.    Follow Up Recommendations  SNF    Equipment Recommendations  None recommended by OT          Precautions / Restrictions Precautions Precautions: Fall Restrictions Weight Bearing Restrictions: No       Mobility Bed Mobility Overal bed mobility: Needs Assistance Bed Mobility: Sit to Supine        Sit to supine: Supervision;Min guard   General bed mobility comments: labored movement with grimacing    Transfers Overall transfer level: Needs assistance Equipment used: Rolling walker (2 wheeled) Transfers: Sit to/from [facility] Sit to Stand: Min guard Stand pivot transfers: Min assist       General transfer comment: Assist to manage RW during ambulation.    Balance Overall balance assessment: Needs assistance Sitting-balance support: Feet supported;No upper extremity supported Sitting balance-Leahy Scale: Good Sitting balance - Comments: good seated at EOB   Standing balance support: During functional activity;Bilateral upper extremity supported Standing balance-Leahy Scale: Fair Standing balance comment: fair using RW                           ADL either performed or assessed with clinical judgement   ADL Overall ADL's : Needs assistance/impaired     Grooming: Standing;Wash/dry hands;Min guard Grooming Details (indicate cue type and reason): PT stood at sink using RW while washing face with R UE. Pt prompted to try using L UE but was unable to lift L UE to face without phsycial assist. Pt was able to open a deodorant container with s/u assist using L UE as support hand.     Lower Body Bathing: Supervison/ safety;Min guard;Sitting/lateral leans Lower Body Bathing Details (indicate cue type and reason): Pt able to use wash cloth to was B LE  knee to ankle crossing legs and leaning forward. Pt used R UE to was B LE.     Lower Body Dressing: Minimal assistance;Moderate assistance;Sitting/lateral leans Lower Body Dressing Details (indicate cue type and reason): Min to mod A for L LE donning of socks. Pt required assist to start sock on foot. Toilet Transfer: Minimal assistance;Ambulation;RW Toilet Transfer Details (indicate cue type and reason): Simulated via chair to standing at sink to sitting at EOB with Min A using RW.                                    Cognition Arousal/Alertness: Awake/alert Behavior During Therapy: WFL for tasks assessed/performed Overall Cognitive Status: No family/caregiver present to determine baseline cognitive functioning                                                            Pertinent Vitals/ Pain       Pain Assessment: 0-10 Pain Score: 6  Pain Location: Right lower abdomen (Pt also reports back pain) Pain Descriptors / Indicators: Throbbing Pain Intervention(s): Limited activity within patient's tolerance;Monitored during session;Repositioned                                                          Frequency  Min 2X/week        Progress Toward Goals  OT Goals(current goals can now be found in the care plan section)  Progress towards OT goals: Progressing toward goals  Acute Rehab OT Goals Patient Stated Goal: return home with family to assist OT Goal Formulation: Patient unable to participate in goal setting Time For Goal Achievement: 11/28/20 Potential to Achieve Goals: Fair ADL Goals Pt Will Perform Eating: with modified independence;sitting Pt Will Perform Grooming: with supervision;standing Pt Will Perform Upper Body Bathing: with supervision;sitting Pt Will Perform Lower Body Bathing: with mod assist;sitting/lateral leans;sit to/from stand Pt Will Perform Upper Body Dressing: with supervision;sitting Pt Will Perform Lower Body Dressing: with min assist;sitting/lateral leans;with adaptive equipment;sit to/from stand Pt Will Transfer to Toilet: with min assist;ambulating;bedside commode Pt Will Perform Toileting - Clothing Manipulation and hygiene: with min assist;sitting/lateral leans;sit to/from stand Pt/caregiver will Perform Home Exercise Program: Increased strength;Both right and left upper extremity;With Supervision;With written HEP provided  Plan Discharge plan remains appropriate                                     End of Session Equipment Utilized During Treatment: Rolling walker  OT Visit Diagnosis: Muscle weakness (generalized) (M62.81);History of falling (Z91.81) Hemiplegia - Right/Left: Left Hemiplegia - dominant/non-dominant: Non-Dominant   Activity Tolerance Patient tolerated treatment well   Patient Left in bed;with call bell/phone within reach;with bed alarm set   Nurse Communication          Time: 6599-3570 OT Time Calculation (min): 18 min  Charges: OT General Charges $OT Visit: 1 Visit OT Treatments $Self Care/Home Management : 8-22 mins  [facility] OT, MOT  Larey Seat 11/22/2020,  9:57 AM

## 2021-01-24 ENCOUNTER — Inpatient Hospital Stay (HOSPITAL_COMMUNITY): Payer: Medicare HMO

## 2021-01-24 ENCOUNTER — Encounter (HOSPITAL_COMMUNITY): Payer: Self-pay | Admitting: Radiology

## 2021-01-24 ENCOUNTER — Observation Stay (HOSPITAL_COMMUNITY)
Admission: EM | Admit: 2021-01-24 | Discharge: 2021-01-26 | Disposition: A | Discharge status: Home / Self Care | Payer: Medicare HMO | Attending: Family Medicine | Admitting: Family Medicine

## 2021-01-24 ENCOUNTER — Other Ambulatory Visit: Payer: Self-pay

## 2021-01-24 ENCOUNTER — Emergency Department (HOSPITAL_COMMUNITY): Payer: Medicare HMO

## 2021-01-24 DIAGNOSIS — Z9181 History of falling: Secondary | ICD-10-CM | POA: Insufficient documentation

## 2021-01-24 DIAGNOSIS — I10 Essential (primary) hypertension: Secondary | ICD-10-CM | POA: Diagnosis not present

## 2021-01-24 DIAGNOSIS — I639 Cerebral infarction, unspecified: Principal | ICD-10-CM | POA: Insufficient documentation

## 2021-01-24 DIAGNOSIS — Z87891 Personal history of nicotine dependence: Secondary | ICD-10-CM | POA: Insufficient documentation

## 2021-01-24 DIAGNOSIS — G2581 Restless legs syndrome: Secondary | ICD-10-CM | POA: Diagnosis present

## 2021-01-24 DIAGNOSIS — E119 Type 2 diabetes mellitus without complications: Secondary | ICD-10-CM | POA: Insufficient documentation

## 2021-01-24 DIAGNOSIS — Z8542 Personal history of malignant neoplasm of other parts of uterus: Secondary | ICD-10-CM | POA: Diagnosis not present

## 2021-01-24 DIAGNOSIS — R531 Weakness: Secondary | ICD-10-CM | POA: Diagnosis present

## 2021-01-24 DIAGNOSIS — Z20822 Contact with and (suspected) exposure to covid-19: Secondary | ICD-10-CM | POA: Insufficient documentation

## 2021-01-24 DIAGNOSIS — Z794 Long term (current) use of insulin: Secondary | ICD-10-CM | POA: Diagnosis not present

## 2021-01-24 DIAGNOSIS — E1169 Type 2 diabetes mellitus with other specified complication: Secondary | ICD-10-CM

## 2021-01-24 DIAGNOSIS — E1165 Type 2 diabetes mellitus with hyperglycemia: Secondary | ICD-10-CM | POA: Diagnosis present

## 2021-01-24 DIAGNOSIS — I6350 Cerebral infarction due to unspecified occlusion or stenosis of unspecified cerebral artery: Secondary | ICD-10-CM | POA: Diagnosis present

## 2021-01-24 DIAGNOSIS — I69354 Hemiplegia and hemiparesis following cerebral infarction affecting left non-dominant side: Secondary | ICD-10-CM | POA: Insufficient documentation

## 2021-01-24 DIAGNOSIS — E782 Mixed hyperlipidemia: Secondary | ICD-10-CM | POA: Diagnosis present

## 2021-01-24 LAB — COMPREHENSIVE METABOLIC PANEL
ALT: 13 U/L (ref 0–44)
AST: 13 U/L — ABNORMAL LOW (ref 15–41)
Albumin: 3.8 g/dL (ref 3.5–5.0)
Alkaline Phosphatase: 94 U/L (ref 38–126)
Anion gap: 7 (ref 5–15)
BUN: 9 mg/dL (ref 8–23)
CO2: 27 mmol/L (ref 22–32)
Calcium: 9.3 mg/dL (ref 8.9–10.3)
Chloride: 105 mmol/L (ref 98–111)
Creatinine, Ser: 0.71 mg/dL (ref 0.44–1.00)
GFR, Estimated: >60 mL/min (ref >60)
Glucose, Bld: 150 mg/dL — ABNORMAL HIGH (ref 70–99)
Potassium: 3.4 mmol/L — ABNORMAL LOW (ref 3.5–5.1)
Sodium: 139 mmol/L (ref 135–145)
Total Bilirubin: 0.4 mg/dL (ref 0.3–1.2)
Total Protein: 7 g/dL (ref 6.5–8.1)

## 2021-01-24 LAB — URINALYSIS, ROUTINE W REFLEX MICROSCOPIC
Bacteria, UA: NONE SEEN
Bilirubin Urine: NEGATIVE
Glucose, UA: >=500 mg/dL — AB
Ketones, ur: NEGATIVE mg/dL
Leukocytes,Ua: NEGATIVE
Nitrite: NEGATIVE
Protein, ur: 30 mg/dL — AB
Specific Gravity, Urine: 1.016 (ref 1.005–1.030)
pH: 6 (ref 5.0–8.0)

## 2021-01-24 LAB — CBC WITH DIFFERENTIAL/PLATELET
Abs Immature Granulocytes: 0.03 10*3/uL (ref 0.00–0.07)
Basophils Absolute: 0 10*3/uL (ref 0.0–0.1)
Basophils Relative: 1 %
Eosinophils Absolute: 0 10*3/uL (ref 0.0–0.5)
Eosinophils Relative: 0 %
HCT: 40.9 % (ref 36.0–46.0)
Hemoglobin: 13.9 g/dL (ref 12.0–15.0)
Immature Granulocytes: 1 %
Lymphocytes Relative: 28 %
Lymphs Abs: 1.6 10*3/uL (ref 0.7–4.0)
MCH: 28.8 pg (ref 26.0–34.0)
MCHC: 34 g/dL (ref 30.0–36.0)
MCV: 84.7 fL (ref 80.0–100.0)
Monocytes Absolute: 0.4 10*3/uL (ref 0.1–1.0)
Monocytes Relative: 7 %
Neutro Abs: 3.5 10*3/uL (ref 1.7–7.7)
Neutrophils Relative %: 63 %
Platelets: 207 10*3/uL (ref 150–400)
RBC: 4.83 MIL/uL (ref 3.87–5.11)
RDW: 13.4 % (ref 11.5–15.5)
WBC: 5.6 10*3/uL (ref 4.0–10.5)
nRBC: 0 % (ref 0.0–0.2)

## 2021-01-24 LAB — PROTIME-INR
INR: 1 (ref 0.8–1.2)
Prothrombin Time: 13 seconds (ref 11.4–15.2)

## 2021-01-24 LAB — RESP PANEL BY RT-PCR (FLU A&B, COVID) ARPGX2
Influenza A by PCR: NEGATIVE
Influenza B by PCR: NEGATIVE
SARS Coronavirus 2 by RT PCR: NEGATIVE

## 2021-01-24 IMAGING — CT CT HEAD W/O CM
3 series · 16 of 47 positions shown, 19 images · non-contrast
Comparison: CT head [DATE].

CLINICAL DATA: Transient ischemic attack (TIA). Multiple falls.
Argumentative.

EXAM:
CT HEAD WITHOUT CONTRAST
TECHNIQUE: Contiguous axial images were obtained from the base of the skull
through the vertex without intravenous contrast.

[Series 2: head w o · axial · 0.42mm/px · z∈[+51,+176]mm · 10 of 31 slices shown, 13 images]
[im 3/31  brain]
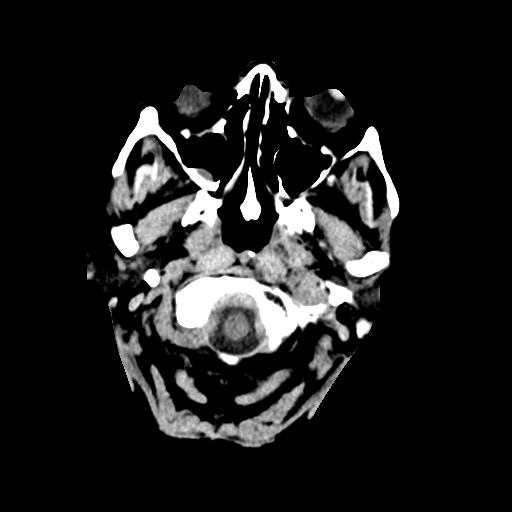
[im 3/31  bone]
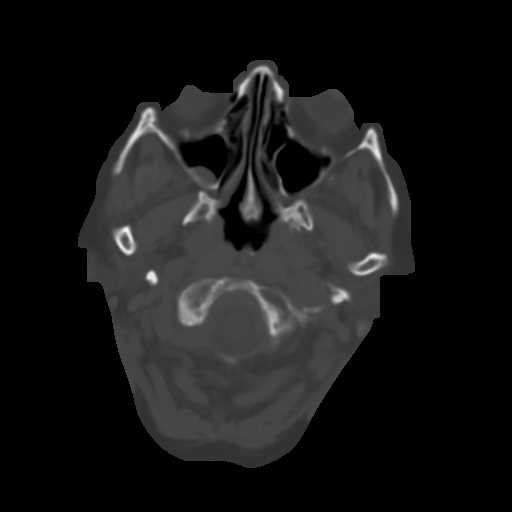
[im 6/31  brain]
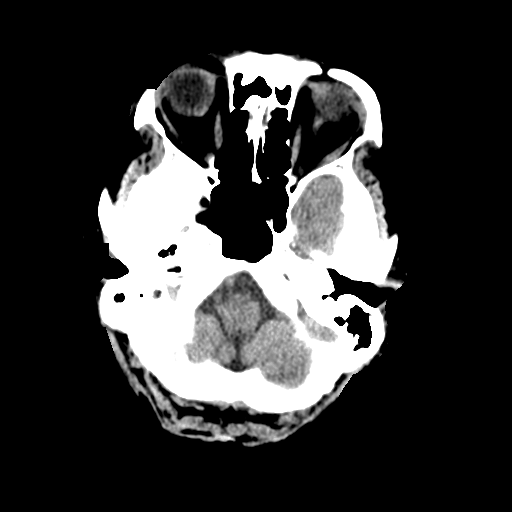
[im 9/31  brain]
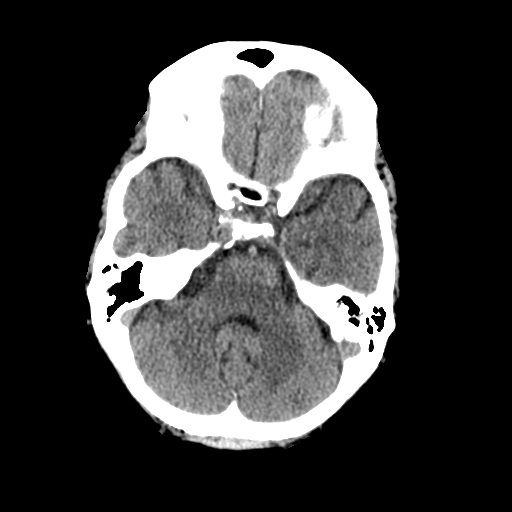
[im 11/31  brain]
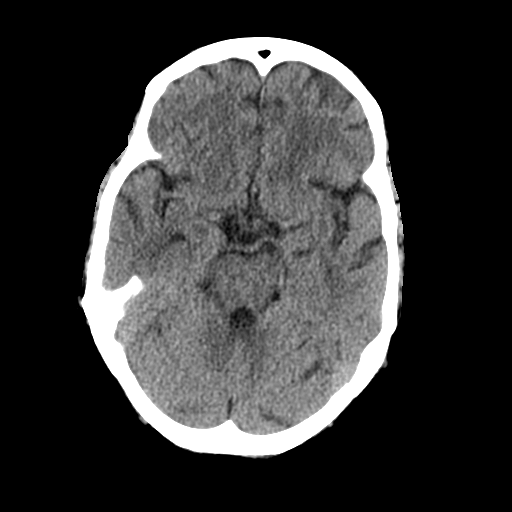
[im 14/31  brain]
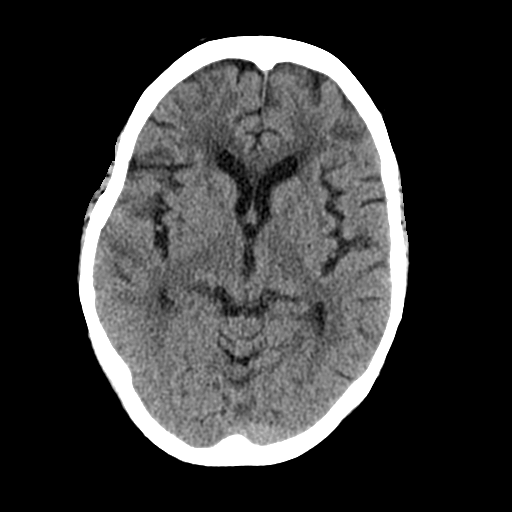
[im 14/31  bone]
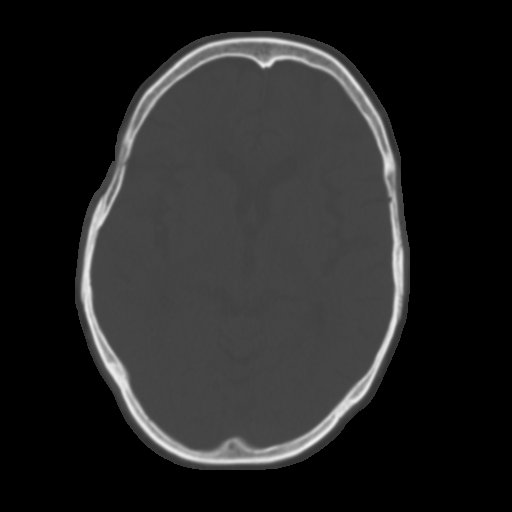
[im 17/31  brain]
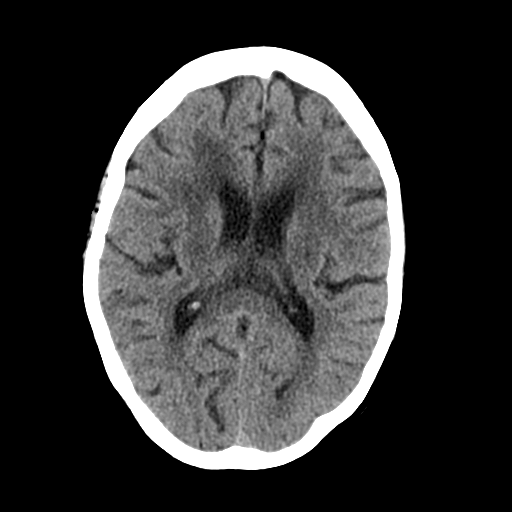
[im 20/31  brain]
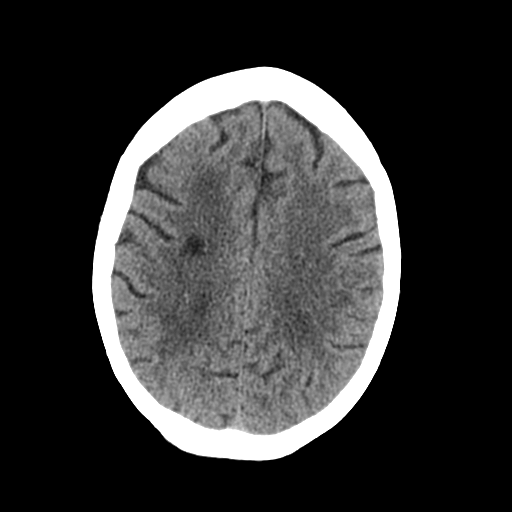
[im 23/31  brain]
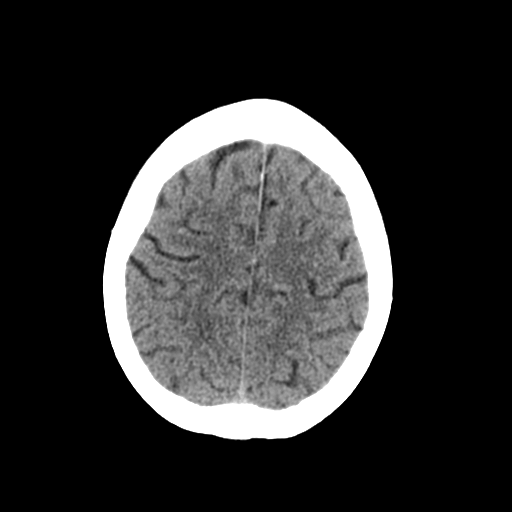
[im 25/31  brain]
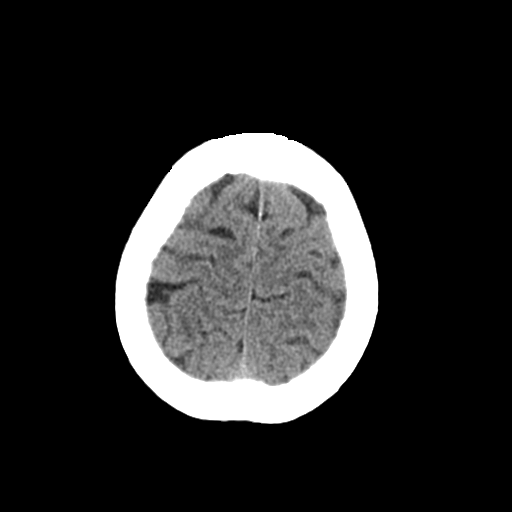
[im 25/31  bone]
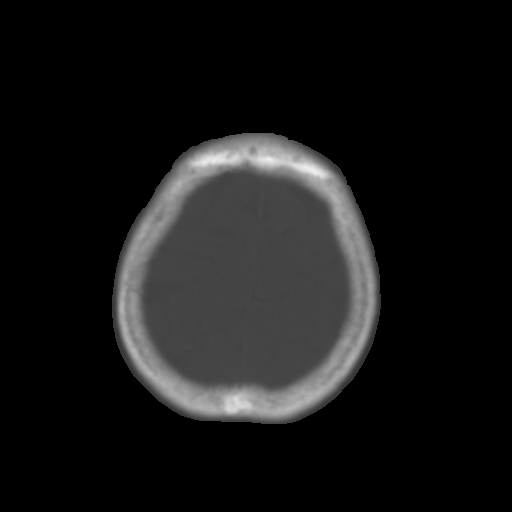
[im 28/31  brain]
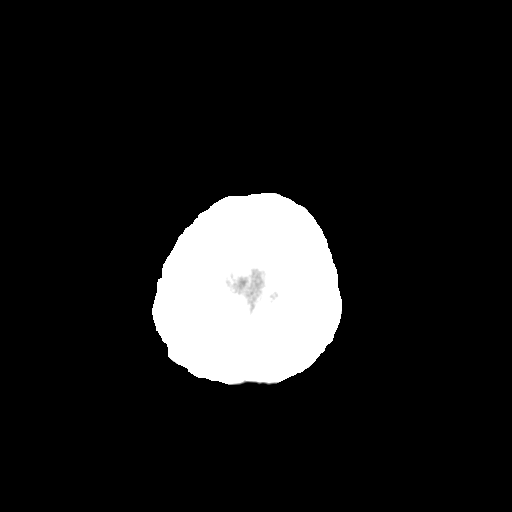

[Series 4: coronal soft · coronal · 0.31mm/px · 3 of 67 slices shown]
[im 23/67  brain]
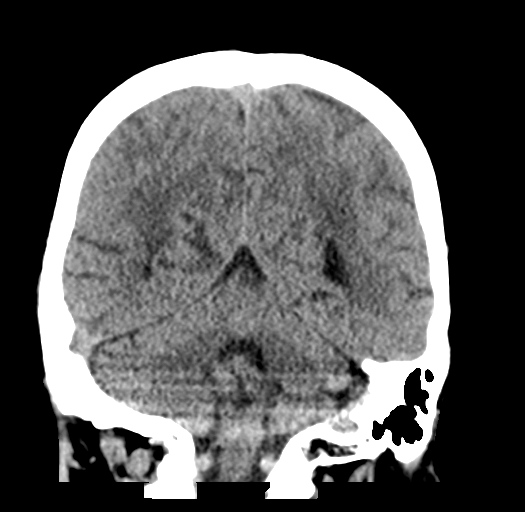
[im 30/67  brain]
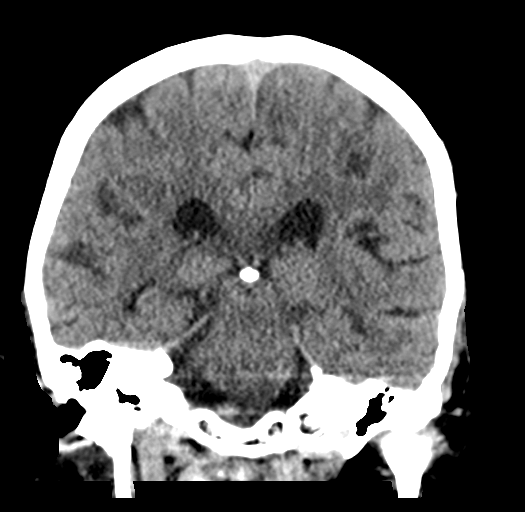
[im 37/67  brain]
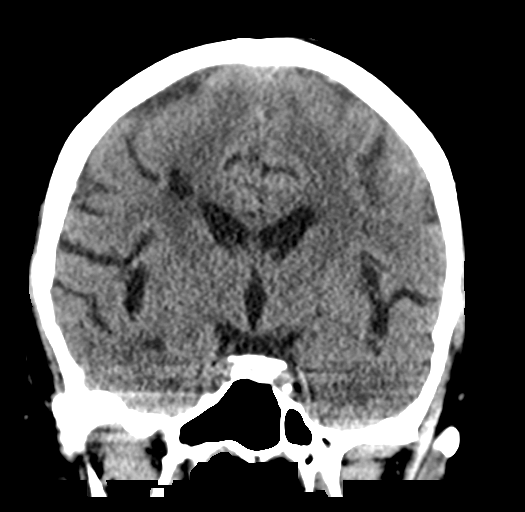

[Series 5: sagittal soft · sagittal · 0.35mm/px · 3 of 54 slices shown]
[im 18/54  brain]
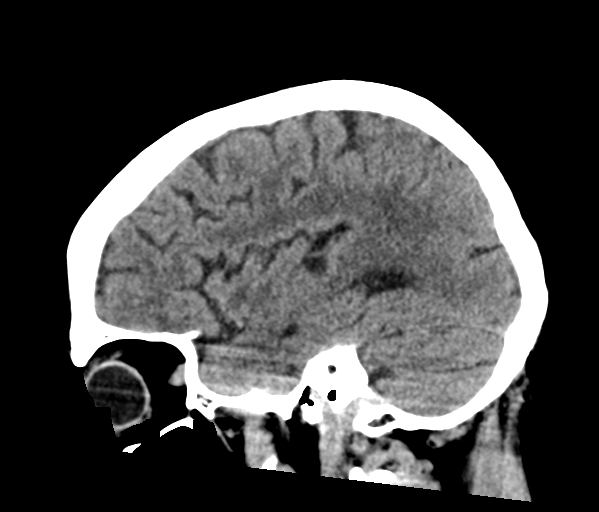
[im 27/54  brain]
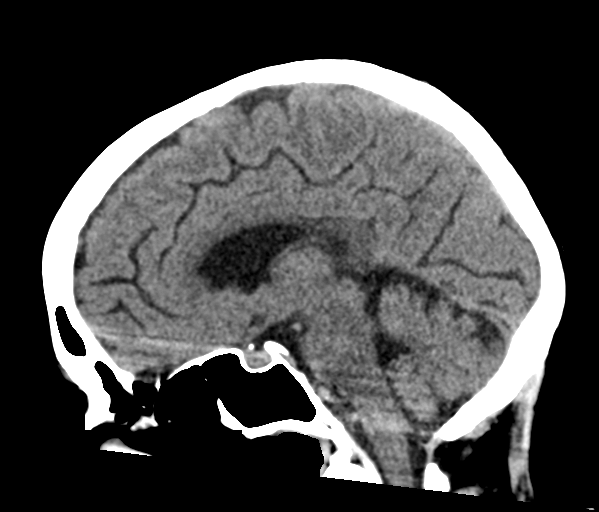
[im 36/54  brain]
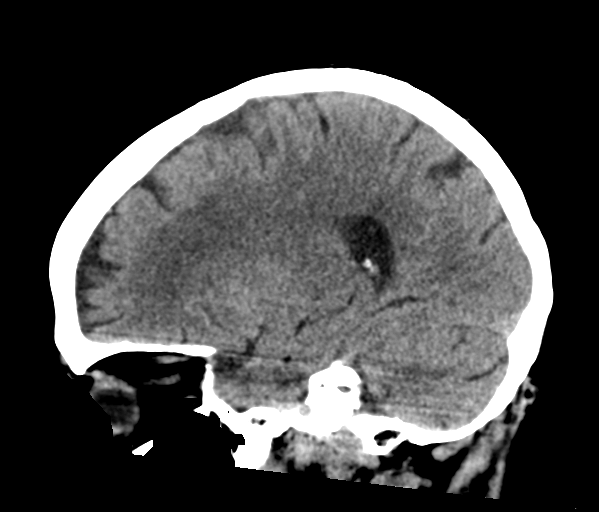

[16 of 47 positions shown; findings below may reference images not displayed]

BRAIN:
BRAIN
Patchy and confluent areas of decreased attenuation are noted
throughout the deep and periventricular white matter of the cerebral
hemispheres bilaterally, compatible with chronic microvascular
ischemic disease.

No evidence of large-territorial acute infarction. No parenchymal
hemorrhage. Interval development of a 7 mm hyperdense lesion along
the gray-white matter junction of the left frontal lobe. Query
another similar finding measuring 2 mm along the right parietal
gray-white matter junction. No extra-axial collection.

No mass effect or midline shift. No hydrocephalus. Basilar cisterns
are patent.

Vascular: No hyperdense vessel. Atherosclerotic calcifications are
present within the cavernous internal carotid arteries.

Skull: No acute fracture or focal lesion.

Sinuses/Orbits: Right maxillary mucosal thickening. Paranasal
sinuses and mastoid air cells are clear. The orbits are
unremarkable.

Other: None.
IMPRESSION: Interval development of a 7 mm hyperdense lesion along the
gray-white matter junction of the left frontal lobe. Query another
similar finding measuring 2 mm along the right parietal lobe.
Finding concerning for metastases.

## 2021-01-24 IMAGING — CT CT ANGIO HEAD-NECK (W OR W/O PERF)
3 of 7 series · 10 of 36 positions shown · IV contrast (Omnipaque or Isovue)
Comparison: [DATE] CT head and MRI head, [DATE] CT head

CLINICAL DATA: Stroke/TIA, multiple recent falls, confusion

EXAM:
CT ANGIOGRAPHY HEAD AND NECK
TECHNIQUE: Multidetector CT imaging of the head and neck was performed using
the standard protocol during bolus administration of intravenous
contrast. Multiplanar CT image reconstructions and MIPs were
obtained to evaluate the vascular anatomy. Carotid stenosis
measurements (when applicable) are obtained utilizing NASCET
criteria, using the distal internal carotid diameter as the
denominator.
CONTRAST:  100mL OMNIPAQUE IOHEXOL 350 MG/ML SOLN

[Series 6: cta head & neck · axial · 0.44mm/px · z∈[-62,+64]mm · 2 of 191 slices shown]
[im 64/191  soft-tissue]
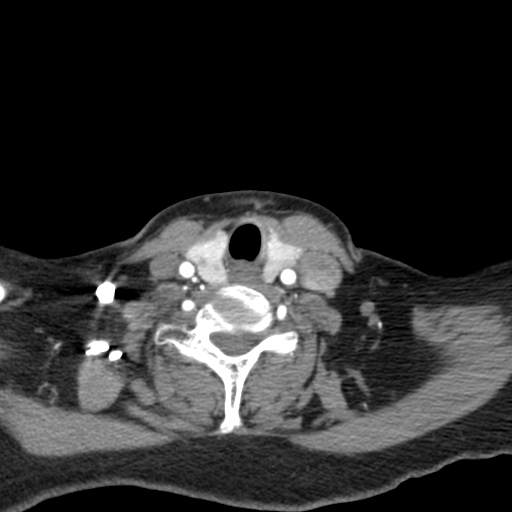
[im 127/191  bone]
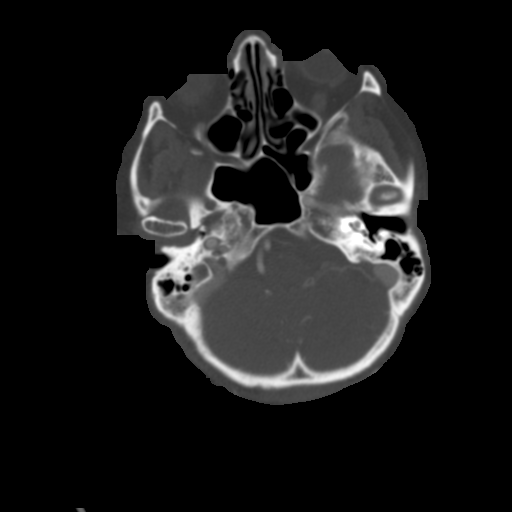

[Series 8: ax thins · axial · 0.39mm/px · z∈[-142,+126]mm · 6 of 380 slices shown]
[im 55/380  soft-tissue]
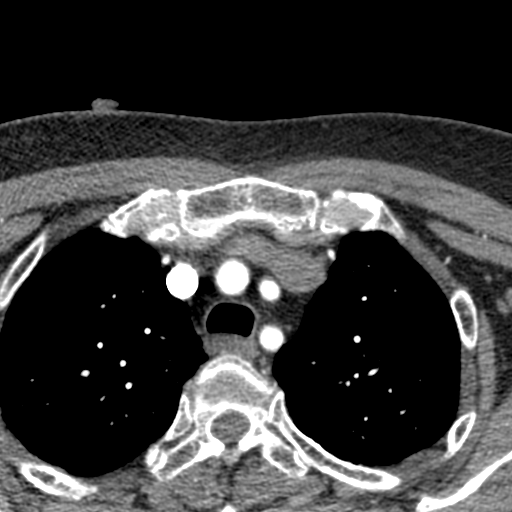
[im 109/380  soft-tissue]
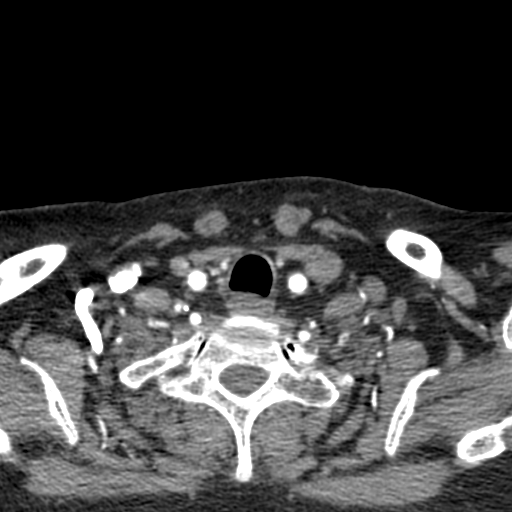
[im 163/380  soft-tissue]
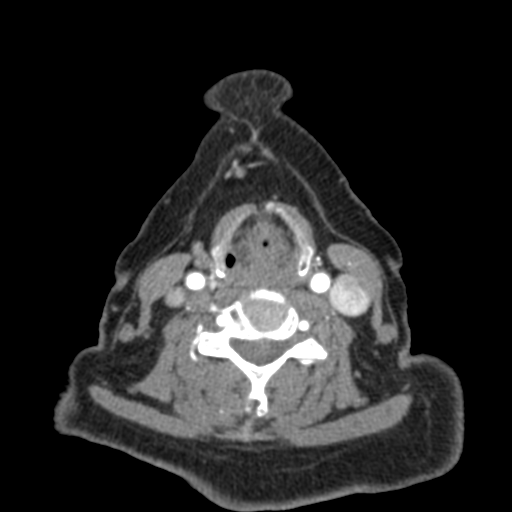
[im 217/380  soft-tissue]
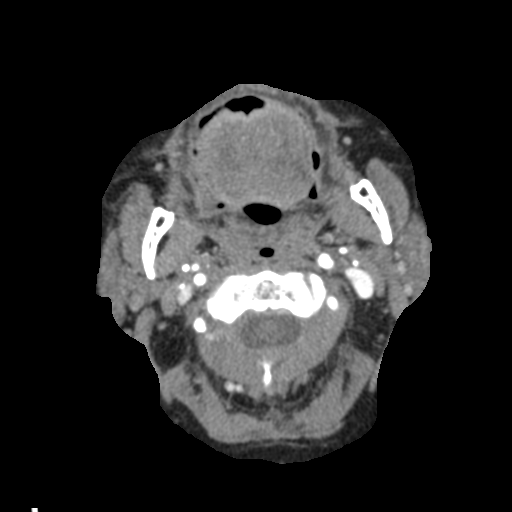
[im 271/380  soft-tissue]
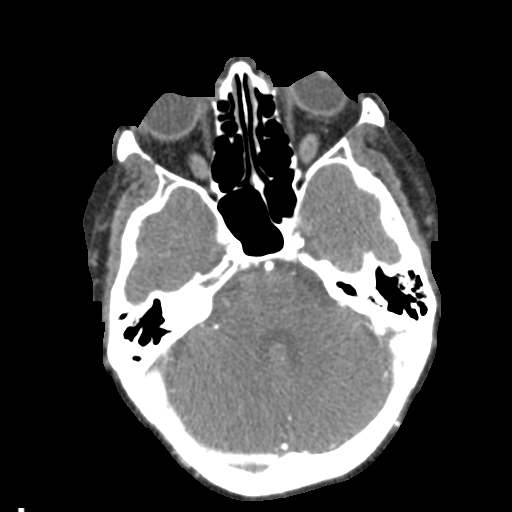
[im 325/380  soft-tissue]
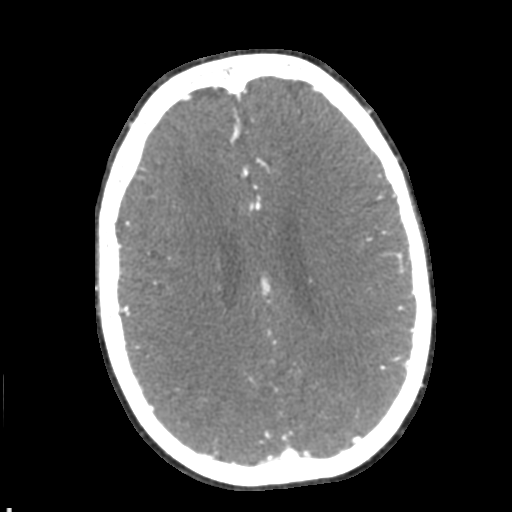

[Series 10: sag thin · sagittal · 0.50mm/px · 2 of 201 slices shown]
[im 65/201  soft-tissue]
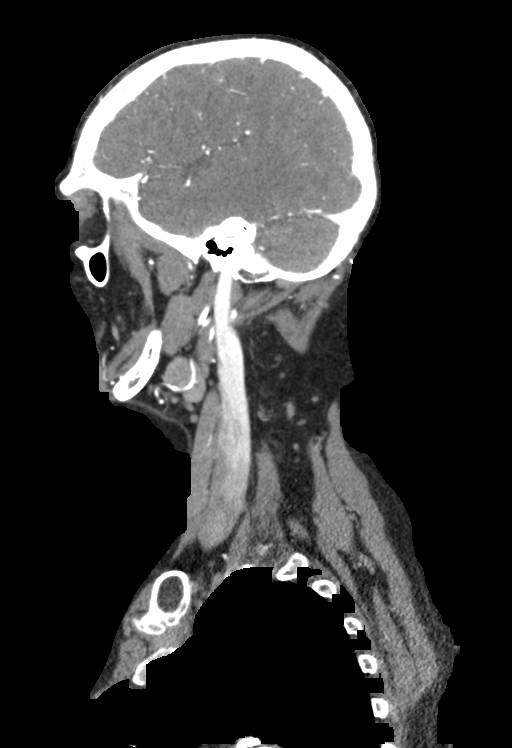
[im 137/201  soft-tissue]
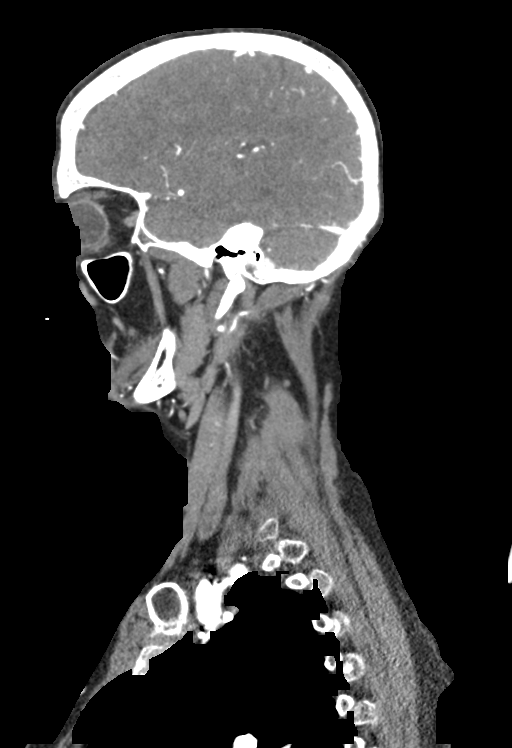

[10 of 36 positions shown; findings below may reference images not displayed]

FINDINGS: CT HEAD FINDINGS

For noncontrast findings, please see same day CT head.

CTA NECK FINDINGS

Aortic arch: Standard branching. Imaged portion shows no evidence of
aneurysm or dissection. No significant stenosis of the major arch
vessel origins.

Right carotid system: No evidence of dissection, stenosis (50% or
greater) or occlusion.

Left carotid system: No evidence of dissection, stenosis (50% or
greater) or occlusion.

Vertebral arteries: Codominant. No evidence of dissection, stenosis
(50% or greater) or occlusion.

Skeleton: No acute osseous abnormality.  Edentulous.

Other neck: Multiple peripherally enhancing lesions in the right
thyroid lobe, the largest of which measures up to 1.2 cm (series 9,
image 102).

Upper chest: Paraseptal and centrilobular emphysema. No focal
pulmonary opacity or pleural effusion.

Review of the MIP images confirms the above findings

CTA HEAD FINDINGS

Anterior circulation: Both internal carotid arteries are patent to
the termini, with mild calcifications but without stenosis or other
abnormality. A1 segments patent. Normal anterior communicating
artery. Anterior cerebral arteries are patent to their distal
aspects. No M1 stenosis or occlusion. Normal MCA bifurcations.
Distal MCA branches perfused and symmetric.

Posterior circulation: Vertebral arteries patent to the
vertebrobasilar junction without stenosis. Posterior inferior
cerebral arteries patent bilaterally. Basilar patent to its distal
aspect. Superior cerebellar arteries patent bilaterally. PCAs
perfused to their distal aspects without stenosis. Bilateral
posterior communicating arteries are visualized.

Venous sinuses: As permitted by contrast timing, patent.

Anatomic variants: None significant

Review of the MIP images confirms the above findings
IMPRESSION: 1. No hemodynamically significant stenosis in the neck.
2. No intracranial large vessel occlusion or significant stenosis.
3. Multiple peripherally enhancing lesions in the right thyroid
lobe. If these have not previously been evaluated with ultrasound,
an ultrasound of the thyroid is recommended.
4.  Emphysema ([CT]-[CT]).

## 2021-01-24 MED ORDER — ENOXAPARIN SODIUM 40 MG/0.4ML IJ SOSY
40.0000 mg | PREFILLED_SYRINGE | Freq: Every day | INTRAMUSCULAR | Status: DC
  Administered 2021-01-24 – 2021-01-25 (×2): 40 mg via SUBCUTANEOUS
  Filled 2021-01-24 (×2): qty 0.4

## 2021-01-24 MED ORDER — ALBUTEROL SULFATE HFA 108 (90 BASE) MCG/ACT IN AERS: 1.0000 | INHALATION_SPRAY | Freq: Four times a day (QID) | RESPIRATORY_TRACT | Status: DC | PRN

## 2021-01-24 MED ORDER — NICOTINE 7 MG/24HR TD PT24
7.0000 mg | MEDICATED_PATCH | Freq: Every day | TRANSDERMAL | Status: DC
  Administered 2021-01-24 – 2021-01-26 (×3): 7 mg via TRANSDERMAL
  Filled 2021-01-24 (×3): qty 1

## 2021-01-24 MED ORDER — ASPIRIN EC 81 MG PO TBEC
81.0000 mg | DELAYED_RELEASE_TABLET | Freq: Every day | ORAL | Status: DC
  Administered 2021-01-25 – 2021-01-26 (×2): 81 mg via ORAL
  Filled 2021-01-24 (×2): qty 1

## 2021-01-24 MED ORDER — GLIMEPIRIDE 2 MG PO TABS
1.0000 mg | ORAL_TABLET | Freq: Every day | ORAL | Status: DC
  Administered 2021-01-25 – 2021-01-26 (×2): 1 mg via ORAL
  Filled 2021-01-24 (×2): qty 1

## 2021-01-24 MED ORDER — STROKE: EARLY STAGES OF RECOVERY BOOK
Freq: Once | Status: AC
  Filled 2021-01-24: qty 1

## 2021-01-24 MED ORDER — LORATADINE 10 MG PO TABS
10.0000 mg | ORAL_TABLET | Freq: Every day | ORAL | Status: DC
  Administered 2021-01-25 – 2021-01-26 (×2): 10 mg via ORAL
  Filled 2021-01-24 (×2): qty 1

## 2021-01-24 MED ORDER — ESCITALOPRAM OXALATE 10 MG PO TABS
20.0000 mg | ORAL_TABLET | Freq: Every day | ORAL | Status: DC
  Administered 2021-01-25 – 2021-01-26 (×2): 20 mg via ORAL
  Filled 2021-01-24 (×2): qty 2

## 2021-01-24 MED ORDER — ACETAMINOPHEN 160 MG/5ML PO SOLN: 650.0000 mg | ORAL | Status: DC | PRN

## 2021-01-24 MED ORDER — INSULIN NPH (HUMAN) (ISOPHANE) 100 UNIT/ML ~~LOC~~ SUSP
40.0000 [IU] | Freq: Every day | SUBCUTANEOUS | Status: DC
  Administered 2021-01-25: 40 [IU] via SUBCUTANEOUS
  Filled 2021-01-24: qty 10

## 2021-01-24 MED ORDER — ACETAMINOPHEN 650 MG RE SUPP: 650.0000 mg | RECTAL | Status: DC | PRN

## 2021-01-24 MED ORDER — ROPINIROLE HCL 0.25 MG PO TABS
0.2500 mg | ORAL_TABLET | Freq: Every day | ORAL | Status: DC
  Administered 2021-01-24 – 2021-01-25 (×2): 0.25 mg via ORAL
  Filled 2021-01-24 (×2): qty 1

## 2021-01-24 MED ORDER — HYDRALAZINE HCL 20 MG/ML IJ SOLN: 10.0000 mg | INTRAMUSCULAR | Status: DC | PRN

## 2021-01-24 MED ORDER — ATORVASTATIN CALCIUM 40 MG PO TABS
80.0000 mg | ORAL_TABLET | Freq: Every day | ORAL | Status: DC
  Administered 2021-01-25 – 2021-01-26 (×2): 80 mg via ORAL
  Filled 2021-01-24 (×2): qty 2

## 2021-01-24 MED ORDER — SODIUM CHLORIDE 0.9 % IV SOLN: INTRAVENOUS | Status: AC

## 2021-01-24 MED ORDER — INSULIN NPH (HUMAN) (ISOPHANE) 100 UNIT/ML ~~LOC~~ SUSP
20.0000 [IU] | Freq: Every day | SUBCUTANEOUS | Status: DC
  Administered 2021-01-25: 20 [IU] via SUBCUTANEOUS
  Filled 2021-01-24 (×2): qty 10

## 2021-01-24 MED ORDER — POTASSIUM CHLORIDE CRYS ER 20 MEQ PO TBCR
40.0000 meq | EXTENDED_RELEASE_TABLET | Freq: Every morning | ORAL | Status: DC
  Administered 2021-01-25 – 2021-01-26 (×2): 40 meq via ORAL
  Filled 2021-01-24 (×2): qty 2

## 2021-01-24 MED ORDER — ALBUTEROL SULFATE (2.5 MG/3ML) 0.083% IN NEBU: 2.5000 mg | INHALATION_SOLUTION | Freq: Four times a day (QID) | RESPIRATORY_TRACT | Status: DC | PRN

## 2021-01-24 MED ORDER — SENNOSIDES-DOCUSATE SODIUM 8.6-50 MG PO TABS: 1.0000 | ORAL_TABLET | Freq: Every evening | ORAL | Status: DC | PRN

## 2021-01-24 MED ORDER — IOHEXOL 350 MG/ML SOLN
100.0000 mL | Freq: Once | INTRAVENOUS | Status: AC | PRN
  Administered 2021-01-24: 100 mL via INTRAVENOUS

## 2021-01-24 MED ORDER — INSULIN ASPART 100 UNIT/ML IJ SOLN
0.0000 [IU] | Freq: Three times a day (TID) | INTRAMUSCULAR | Status: DC
  Administered 2021-01-25 – 2021-01-26 (×3): 3 [IU] via SUBCUTANEOUS

## 2021-01-24 MED ORDER — ACETAMINOPHEN 325 MG PO TABS: 650.0000 mg | ORAL_TABLET | ORAL | Status: DC | PRN

## 2021-01-24 MED ORDER — ACETAMINOPHEN 325 MG PO TABS
650.0000 mg | ORAL_TABLET | Freq: Four times a day (QID) | ORAL | Status: DC | PRN
  Administered 2021-01-25: 650 mg via ORAL

## 2021-01-24 NOTE — ED Provider Notes (Signed)
[facility] EMERGENCY DEPARTMENT Provider Note   CSN: [id number] Arrival date & time: 01/24/21  1526     History Chief Complaint  Patient presents with   Extremity Weakness    Cheyenne [name] is a 70 y.o. female.  HPI  Patient with significant medical history including diabetes, hypertension, CVA with left residual weakness presents  with chief complaint of worsening weakness.  Patient states that she felt slightly weak yesterday on the left side, felt worse in her left arm, she denies  headaches, change in vision, paresthesias in the upper/ lower extremities.  She denies feeling lightheaded or dizziness, she does notice that she has had  frequent falls, states that she just gets off balance when she goes from a sitting to a standing position, states that she fell yesterday but denies hitting her head or losing consciousness.  She denies associated fevers, chills, nasal ingestion, sore throat, cough, general body aches, denies any stomach pain, nausea, vomit, diarrhea, denies urinary symptoms.  States that she is tolerant p.o. any difficulty.  Denies alleviating aggravating factors.  Spoke with patient's granddaughter who endorses that she noted that the patient became very weak on her left side around 9 PM last night, states that she is having difficulty with grabbing things and ambulating.  She concerned for possible stroke and brought here for further evaluation.  Past Medical History:  Diagnosis Date   Diabetes mellitus without complication ([place])    Endometrial cancer ([place])    details unavailable. 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 ([place]) 01/24/2021   Protein-calorie malnutrition, severe 11/18/2020    Enterocolitis 11/10/2020   Abdominal pain 11/10/2020   Nausea & vomiting 11/10/2020   AKI (acute kidney injury) ([place]) 11/10/2020   Hyperglycemia due to diabetes mellitus ([place]) 11/10/2020   Leukocytosis 11/10/2020   Mixed hyperlipidemia 11/10/2020   Tobacco use 11/10/2020   Asthma 11/10/2020   SIRS (systemic inflammatory response syndrome) ([place]) 11/10/2020   Restless leg syndrome 11/10/2020   Vitamin D deficiency 11/10/2020   Enteritis 11/10/2020   Cerebrovascular accident (CVA) ([place])    Generalized weakness 10/25/2020   Essential hypertension 10/25/2020   Type 2 diabetes mellitus with hyperlipidemia ([place]) 10/25/2020   Psychiatric problem 10/25/2020    Past Surgical History:  Procedure Laterality Date   APPENDECTOMY     BACK SURGERY  2016   laminectomy and facetectomy     OB History   No obstetric history on file.     No family history on file.  Social History   Tobacco Use   Smoking status: Former    Packs/day: 0.50    Types: Cigarettes    Quit date: 10/26/2020    Years since quitting: 0.2   Smokeless tobacco: Never  Vaping Use   Vaping Use: Never used  Substance Use Topics   Alcohol use: Not Currently   Drug use: Never    Home Medications Prior to Admission medications   Medication Sig Start Date End Date Taking? Authorizing Provider  acetaminophen (TYLENOL) 325 MG tablet Take 650 mg by mouth every 6 (six) hours as needed for mild pain.   Yes [provider]  albuterol (VENTOLIN HFA) 108 (90 Base) MCG/ACT inhaler  Inhale 1-2 puffs into the lungs every 6 (six) hours as needed for wheezing or shortness of breath. 01/18/19  Yes Gertie Baron, NP  alendronate (FOSAMAX) 70 MG tablet Take 70 mg by mouth once a week. Wednesday 10/03/20  Yes [provider]  amLODipine (NORVASC) 10 MG tablet Take 1 tablet (10 mg total) by mouth daily. 10/31/20  Yes Regalado, Belkys A, MD  aspirin 81 MG EC tablet Take 1 tablet by mouth daily. 11/02/04  Yes [provider]  atorvastatin (LIPITOR) 40 MG tablet Take 80 mg by mouth daily. 11/26/17  Yes [provider]  cholecalciferol (VITAMIN D3) 25 MCG (1000 UNIT) tablet Take 1,000 Units by mouth daily.   Yes [provider]  escitalopram (LEXAPRO) 20 MG tablet Take 20 mg by mouth daily. 01/03/21  Yes [provider]  glimepiride (AMARYL) 1 MG tablet Take 1 mg by mouth daily with breakfast. 11/05/17  Yes [provider]  ibuprofen (ADVIL) 200 MG tablet Take 400 mg by mouth every 6 (six) hours as needed.   Yes [provider]  insulin NPH Human (NOVOLIN N) 100 UNIT/ML injection Inject 0.13 mLs (13 Units total) into the skin 2 (two) times daily at 8 am and 10 pm. Patient taking differently: Inject 20-40 Units into the skin 2 (two) times daily at 8 am and 10 pm. Inject 40 in the morning and 20 units every evening 11/21/20  Yes Johnson, Clanford L, MD  loperamide (IMODIUM) 2 MG capsule Take 1 capsule (2 mg total) by mouth 3 (three) times daily as needed for diarrhea or loose stools. 11/21/20  Yes Johnson, Clanford L, MD  loratadine (CLARITIN) 10 MG tablet Take 10 mg by mouth daily.   Yes [provider]  losartan (COZAAR) 50 MG tablet Take 50 mg by mouth 2 (two) times daily. 12/08/20  Yes [provider]  nicotine (NICODERM CQ - DOSED IN MG/24 HOURS) 21 mg/24hr patch Place 1 patch (21 mg total) onto the skin daily. Patient taking differently: Place 7 mg onto the skin daily. 11/01/20  Yes Regalado, Belkys A, MD  potassium chloride SA (KLOR-CON) 20 MEQ tablet Take 40 mEq by mouth every morning. 01/04/21  Yes [provider]  rOPINIRole (REQUIP) 0.25 MG tablet Take 0.25 mg by mouth at bedtime. 10/06/20  Yes [provider]  potassium chloride SA (KLOR-CON) 10 MEQ tablet Take 1 tablet (10 mEq total) by mouth daily for 5 days. Patient not taking: Reported on 01/24/2021 11/21/20 11/26/20  Murlean Iba, MD    Allergies    Gabapentin,  Pregabalin, and Codeine  Review of Systems   Review of Systems  Constitutional:  Negative for chills and fever.  HENT:  Negative for congestion.   Respiratory:  Negative for shortness of breath.   Cardiovascular:  Negative for chest pain.  Gastrointestinal:  Negative for abdominal pain.  Genitourinary:  Negative for enuresis.  Musculoskeletal:  Negative for back pain.  Skin:  Negative for rash.  Neurological:  Positive for weakness. Negative for dizziness, numbness and headaches.  Hematological:  Does not bruise/bleed easily.   Physical Exam Updated Vital Signs BP (!) 182/77   Pulse 62   Temp 97.6 F (36.4 C) (Oral)   Resp 20   Ht 5\' 4"  (1.626 m)   Wt 62.6 kg   SpO2 95%   BMI 23.69 kg/m   Physical Exam Vitals and nursing note reviewed.  Constitutional:      General: She is not in acute distress.  Appearance: She is not ill-appearing.  HENT:     Head: Normocephalic and atraumatic.     Nose: No congestion.  Eyes:     Conjunctiva/sclera: Conjunctivae normal.  Cardiovascular:     Rate and Rhythm: Normal rate and regular rhythm.     Pulses: Normal pulses.     Heart sounds: No murmur heard.   No friction rub. No gallop.  Pulmonary:     Effort: No respiratory distress.     Breath sounds: No wheezing, rhonchi or rales.  Abdominal:     Palpations: Abdomen is soft.     Tenderness: There is no abdominal tenderness. There is no right CVA tenderness or left CVA tenderness.  Musculoskeletal:     Comments: Patient has 4-5 grip strength and bicep/tricep strength in the left upper extremity, 5 5 strength noted in the right upper as well as lower extremities bilaterally.  Neurovascular is fully intact.  Spine is nontender to palpation, no step-off deformities present.  No hip deformities present.  Patient is able ambulate.  Skin:    General: Skin is warm and dry.  Neurological:     Mental Status: She is alert.     GCS: GCS eye subscore is 4. GCS verbal subscore is 5. GCS  motor subscore is 6.     Cranial Nerves: No cranial nerve deficit or facial asymmetry.     Sensory: Sensation is intact.     Motor: Weakness and pronator drift present.     Coordination: Finger-Nose-Finger Test abnormal.     Gait: Gait is intact.     Comments: Cranial nerves II through XII grossly intact, no difficult word finding, no slurring of words, able to follow two-step commands, she has noted weakness in the left upper extremity 4-5 strength versus 5 5 strength in the other 3 extremities.  There was slight left-sided pronator drift noted as well, and difficulty with finger-to-nose on the left side.  Psychiatric:        Mood and Affect: Mood normal.    ED Results / Procedures / Treatments   Labs (all labs ordered are listed, but only abnormal results are displayed) Labs Reviewed  COMPREHENSIVE METABOLIC PANEL - Abnormal; Notable for the following components:      Result Value   Potassium 3.4 (*)    Glucose, Bld 150 (*)    AST 13 (*)    All other components within normal limits  URINALYSIS, ROUTINE W REFLEX MICROSCOPIC - Abnormal; Notable for the following components:   Glucose, UA >=500 (*)    Hgb urine dipstick MODERATE (*)    Protein, ur 30 (*)    All other components within normal limits  RESP PANEL BY RT-PCR (FLU A&B, COVID) ARPGX2  CBC WITH DIFFERENTIAL/PLATELET  PROTIME-INR  TSH  RAPID URINE DRUG SCREEN, HOSP PERFORMED  HEMOGLOBIN A1C  LIPID PANEL    EKG EKG Interpretation  Date/Time:  Tuesday January 24 2021 15:46:19 EST Ventricular Rate:  80 PR Interval:  128 QRS Duration: 84 QT Interval:  400 QTC Calculation: 461 R Axis:   65 Text Interpretation: Normal sinus rhythm Low voltage QRS Borderline ECG No significant change since last tracing Confirmed by Dorie Rank [phone]) on 01/24/2021 5:21:59 PM  Radiology CT ANGIO HEAD NECK W WO CM  Result Date: 01/24/2021 CLINICAL DATA:  Stroke/TIA, multiple recent falls, confusion EXAM: CT ANGIOGRAPHY HEAD AND NECK  TECHNIQUE: Multidetector CT imaging of the head and neck was performed using the standard protocol during bolus administration of intravenous contrast.  Multiplanar CT image reconstructions and MIPs were obtained to evaluate the vascular anatomy. Carotid stenosis measurements (when applicable) are obtained utilizing NASCET criteria, using the distal internal carotid diameter as the denominator. CONTRAST:  152mL OMNIPAQUE IOHEXOL 350 MG/ML SOLN COMPARISON:  01/24/2021 CT head and MRI head, 10/30/2020 CT head FINDINGS: CT HEAD FINDINGS For noncontrast findings, please see same day CT head. CTA NECK FINDINGS Aortic arch: Standard branching. Imaged portion shows no evidence of aneurysm or dissection. No significant stenosis of the major arch vessel origins. Right carotid system: No evidence of dissection, stenosis (50% or greater) or occlusion. Left carotid system: No evidence of dissection, stenosis (50% or greater) or occlusion. Vertebral arteries: Codominant. No evidence of dissection, stenosis (50% or greater) or occlusion. Skeleton: No acute osseous abnormality.  Edentulous. Other neck: Multiple peripherally enhancing lesions in the right thyroid lobe, the largest of which measures up to 1.2 cm (series 9, image 102). Upper chest: Paraseptal and centrilobular emphysema. No focal pulmonary opacity or pleural effusion. Review of the MIP images confirms the above findings CTA HEAD FINDINGS Anterior circulation: Both internal carotid arteries are patent to the termini, with mild calcifications but without stenosis or other abnormality. A1 segments patent. Normal anterior communicating artery. Anterior cerebral arteries are patent to their distal aspects. No M1 stenosis or occlusion. Normal MCA bifurcations. Distal MCA branches perfused and symmetric. Posterior circulation: Vertebral arteries patent to the vertebrobasilar junction without stenosis. Posterior inferior cerebral arteries patent bilaterally. Basilar patent to  its distal aspect. Superior cerebellar arteries patent bilaterally. PCAs perfused to their distal aspects without stenosis. Bilateral posterior communicating arteries are visualized. Venous sinuses: As permitted by contrast timing, patent. Anatomic variants: None significant Review of the MIP images confirms the above findings IMPRESSION: 1. No hemodynamically significant stenosis in the neck. 2. No intracranial large vessel occlusion or significant stenosis. 3. Multiple peripherally enhancing lesions in the right thyroid lobe. If these have not previously been evaluated with ultrasound, an ultrasound of the thyroid is recommended. 4.  Emphysema (ICD10-J43.9). Electronically Signed   By: Merilyn Baba M.D.   On: 01/24/2021 23:52   CT Head Wo Contrast  Result Date: 01/24/2021 CLINICAL DATA:  Transient ischemic attack (TIA). Multiple falls. Argumentative. EXAM: CT HEAD WITHOUT CONTRAST TECHNIQUE: Contiguous axial images were obtained from the base of the skull through the vertex without intravenous contrast. COMPARISON:  CT head 10/30/2020. BRAIN: BRAIN Patchy and confluent areas of decreased attenuation are noted throughout the deep and periventricular white matter of the cerebral hemispheres bilaterally, compatible with chronic microvascular ischemic disease. No evidence of large-territorial acute infarction. No parenchymal hemorrhage. Interval development of a 7 mm hyperdense lesion along the gray-white matter junction of the left frontal lobe. Query another similar finding measuring 2 mm along the right parietal gray-white matter junction. No extra-axial collection. No mass effect or midline shift. No hydrocephalus. Basilar cisterns are patent. Vascular: No hyperdense vessel. Atherosclerotic calcifications are present within the cavernous internal carotid arteries. Skull: No acute fracture or focal lesion. Sinuses/Orbits: Right maxillary mucosal thickening. Paranasal sinuses and mastoid air cells are clear.  The orbits are unremarkable. Other: None. IMPRESSION: Interval development of a 7 mm hyperdense lesion along the gray-white matter junction of the left frontal lobe. Query another similar finding measuring 2 mm along the right parietal lobe. Finding concerning for metastases. Electronically Signed   By: Iven Finn M.D.   On: 01/24/2021 17:45   MR ANGIO HEAD WO CONTRAST  Result Date: 01/24/2021 CLINICAL DATA:  Transient ischemic attack (  TIA) EXAM: MRI HEAD WITHOUT CONTRAST MRA HEAD WITHOUT CONTRAST TECHNIQUE: Multiplanar, multi-echo pulse sequences of the brain and surrounding structures were acquired without intravenous contrast. Angiographic images of the Circle of Willis were acquired using MRA technique without intravenous contrast. COMPARISON:  August 2022 FINDINGS: MRI HEAD FINDINGS Motion artifact is present. Brain: There are minimal new punctate foci of diffusion hyperintensity in the left frontal lobe. Persistent but smaller area with peripheral diffusion hyperintensity in the posterior right frontal white matter. Additional patchy foci in the more anterior right frontal lobe appear to reflect T2 shine through at this point. There are numerous foci of susceptibility in the cerebrum with relative sparing of central structures reflecting chronic microhemorrhages. Patchy and confluent T2 hyperintensity in the supratentorial white matter is nonspecific but may reflect stable chronic microvascular ischemic changes. Vascular: Major vessel flow voids at the skull base are preserved. Skull and upper cervical spine: Normal marrow signal is preserved. Sinuses/Orbits: Paranasal sinuses are aerated. Orbits are unremarkable. Other: Sella is unremarkable.  Mastoid air cells are clear. MRA HEAD FINDINGS Motion artifact is present. Anterior circulation: Intracranial internal carotid arteries are patent. Anterior and middle cerebral arteries are patent. Posterior circulation: Intracranial vertebral arteries are  patent. Basilar artery is patent. Posterior cerebral arteries are patent. Probable distal left P2 PCA stenosis as was also noted on the prior MRA. IMPRESSION: Motion degraded studies. Minimal new punctate acute or subacute infarcts in the left frontal lobe. Area of rim diffusion hyperintensity in the posterior right frontal white matter remains present. This is unusual given length of time since prior study but has gotten smaller. Possible there has been recurrent ischemia at this location. Numerous chronic microhemorrhages in a pattern suggesting amyloid angiopathy. No new proximal intracranial vessel occlusion. Electronically Signed   By: Macy Mis M.D.   On: 01/24/2021 19:25   MR BRAIN WO CONTRAST  Result Date: 01/24/2021 CLINICAL DATA:  Transient ischemic attack (TIA) EXAM: MRI HEAD WITHOUT CONTRAST MRA HEAD WITHOUT CONTRAST TECHNIQUE: Multiplanar, multi-echo pulse sequences of the brain and surrounding structures were acquired without intravenous contrast. Angiographic images of the Circle of Willis were acquired using MRA technique without intravenous contrast. COMPARISON:  August 2022 FINDINGS: MRI HEAD FINDINGS Motion artifact is present. Brain: There are minimal new punctate foci of diffusion hyperintensity in the left frontal lobe. Persistent but smaller area with peripheral diffusion hyperintensity in the posterior right frontal white matter. Additional patchy foci in the more anterior right frontal lobe appear to reflect T2 shine through at this point. There are numerous foci of susceptibility in the cerebrum with relative sparing of central structures reflecting chronic microhemorrhages. Patchy and confluent T2 hyperintensity in the supratentorial white matter is nonspecific but may reflect stable chronic microvascular ischemic changes. Vascular: Major vessel flow voids at the skull base are preserved. Skull and upper cervical spine: Normal marrow signal is preserved. Sinuses/Orbits: Paranasal  sinuses are aerated. Orbits are unremarkable. Other: Sella is unremarkable.  Mastoid air cells are clear. MRA HEAD FINDINGS Motion artifact is present. Anterior circulation: Intracranial internal carotid arteries are patent. Anterior and middle cerebral arteries are patent. Posterior circulation: Intracranial vertebral arteries are patent. Basilar artery is patent. Posterior cerebral arteries are patent. Probable distal left P2 PCA stenosis as was also noted on the prior MRA. IMPRESSION: Motion degraded studies. Minimal new punctate acute or subacute infarcts in the left frontal lobe. Area of rim diffusion hyperintensity in the posterior right frontal white matter remains present. This is unusual given length of time since  prior study but has gotten smaller. Possible there has been recurrent ischemia at this location. Numerous chronic microhemorrhages in a pattern suggesting amyloid angiopathy. No new proximal intracranial vessel occlusion. Electronically Signed   By: Macy Mis M.D.   On: 01/24/2021 19:25    Procedures Procedures   Medications Ordered in ED Medications   stroke: mapping our early stages of recovery book (has no administration in time range)  0.9 %  sodium chloride infusion ( Intravenous New Bag/Given 01/25/21 0018)  senna-docusate (Senokot-S) tablet 1 tablet (has no administration in time range)  enoxaparin (LOVENOX) injection 40 mg (40 mg Subcutaneous Given 01/24/21 2242)  insulin aspart (novoLOG) injection 0-15 Units (has no administration in time range)  acetaminophen (TYLENOL) tablet 650 mg (has no administration in time range)  aspirin EC tablet 81 mg (has no administration in time range)  atorvastatin (LIPITOR) tablet 80 mg (has no administration in time range)  escitalopram (LEXAPRO) tablet 20 mg (has no administration in time range)  glimepiride (AMARYL) tablet 1 mg (has no administration in time range)  insulin NPH Human (NOVOLIN N) injection 40 Units (has no  administration in time range)  insulin NPH Human (NOVOLIN N) injection 20 Units (20 Units Subcutaneous Not Given 01/24/21 2243)  loratadine (CLARITIN) tablet 10 mg (has no administration in time range)  nicotine (NICODERM CQ - dosed in mg/24 hr) patch 7 mg (7 mg Transdermal Patch Applied 01/24/21 2242)  potassium chloride SA (KLOR-CON) CR tablet 40 mEq (has no administration in time range)  rOPINIRole (REQUIP) tablet 0.25 mg (0.25 mg Oral Given 01/24/21 2243)  albuterol (PROVENTIL) (2.5 MG/3ML) 0.083% nebulizer solution 2.5 mg (has no administration in time range)  hydrALAZINE (APRESOLINE) injection 10 mg (has no administration in time range)  iohexol (OMNIPAQUE) 350 MG/ML injection 100 mL (100 mLs Intravenous Contrast Given 01/24/21 2251)    ED Course  I have reviewed the triage vital signs and the nursing notes.  Pertinent labs & imaging results that were available during my care of the patient were reviewed by me and considered in my medical decision making (see chart for details).    MDM Rules/Calculators/A&P                          Initial impression-presents with weakness.  She is alert, does not appear in acute stress, vital signs are reassuring.  I am concerned for possible CVA versus TIA will obtain basic lab work-up CT scan and reassess.  Patient is outside the stroke window will defer on activation of code stroke.  NIH scale is 3.  Work-up-CBC unremarkable, CMP shows potassium 3.4, glucose 150, AST 13, UA negative for nitrates or leukocytes, respiratory panel unremarkable, INR prothrombin time unremarkable.  CT head reveals 7 mm hyperdensity lesion along the gray-white matter junction of the left frontal lobe, similar finding along the right parietal lobe concern for metastases.  MRI of brain without contrast reveals minimally new punctuated acute or subacute infarction in the left frontal lobe, also diffuse hyperintensities in the posterior right frontal lobe possible recurrent  ischemia at this location, no new instructional vessel occlusions present.  Reassessment-due to findings concern for possible opacities and worsening left-sided deficits and concern still for infarct will obtain MRI for further evaluation.  MRI confirms acute infarct, will consult with neurology for further recommendations.  Updated patient recommendation from neurologist she is agreement this plan will consult hospitalist for admission.  Consult- Spoke with Dr. Lorrin Goodell of neurology who  has reviewed the case he recommends admission for further work-up of stroke does not recommend echo at this time.  Recommend reevaluation by neurology team tomorrow.  Spoke with Evangeline Gula who will admit the patient.  Rule out-low suspicion for intracranial head bleed as CT head negative for acute findings.  Low suspicion for meningitis as she has low risk factors, nontoxic-appearing, no proptosis, no meningeal sign present.  Low suspicion for dissection and aneurysm as presentation atypical etiology.  Low suspicion for metabolic encephalopathy as she is nontoxic-appearing, vital signs are reassuring, no leukocytosis, no infections present on exam or within lab work.  Plan-admission for left-sided weakness likely due to new infarct.  Final Clinical Impression(s) / ED Diagnoses Final diagnoses:  Cerebrovascular accident (CVA), unspecified mechanism ([place])  Left-sided weakness    Rx / DC Orders ED Discharge Orders     None        Marcello Fennel, PA-C 01/25/21 0032    Dorie Rank, MD 01/25/21 [phone]

## 2021-01-24 NOTE — ED Notes (Signed)
Daughter requesting updates on pt.

## 2021-01-24 NOTE — ED Notes (Signed)
Measured spo2 while ambulating, remained 95% or higher throughout, denies dizziness. Unsteady gait noted, did not require RN assistance, pt states this is baseline for her to shuffle feet

## 2021-01-24 NOTE — H&P (Signed)
History and Physical    RANDY WHITENER XEN:[id number] DOB: [birth year] DOA: 01/24/2021  PCP: Toni Arthurs, PA  Patient coming from: Home  I have personally briefly reviewed patient's old medical records in [place]  Chief Complaint: Sent in by her home health nurse who felt that she looked different and was not as strong as she was last week  HPI: SHAINNA FAUX is a 70 y.o. female with medical history significant of type 2 diabetes mellitus: Stroke on November 10, 2020, hypertension, hyperlipidemia, COPD, and tobacco use who quit in September 2022 who presents to the emergency department due to increasing weakness noted yesterday on the left side.  She felt worse in her left arm she denies headaches change in visions paresthesias in the upper or lower extremities.  She was sent in at the request of her home health nurse.  The patient denies lightheadedness but notes that she has frequent falls as she gets off balance when going from a sitting to a standing position.  She does not use a walker or a cane.  She fell yesterday but did not hit her head or lose consciousness.  Has no associated fevers, chills, cough, body aches stomach pain nausea vomiting diarrhea.  Granddaughter stated that the patient has become very weak on her left side around 9 PM last night and is having difficulty with grabbing things and ambulating.  Granddaughter was concerned for possible stroke.  The patient has very poor memory and is having great difficulty giving any history.  I am concerned for her ability to make decisions for herself and question whether or not she needs a power of attorney activated.  ED Course: In the emergency department initial CT scan look like possible metastatic disease and MRI and MRA was done which showed new infarcts on the left and right sides of the brain.  Dr. Raliegh Ip from neurology was consulted who felt that she needed admission to admit to investigate why she is having more  strokes.  She is currently taking all of her medicine and states that she has not resumed smoking.  Dr. Raliegh Ip recommended CTA of the carotids and a CT of the head and neck was ordered on admission for tomorrow.  Review of Systems: As per HPI otherwise all other systems reviewed and  negative.   Past Medical History:  Diagnosis Date   Diabetes mellitus without complication ([place])    Endometrial cancer ([place])    details unavailable. brachytherapy seeds within region of cervix on CT imaging 2019   HTN (hypertension)    Peripheral neuropathy    Spinal stenosis    Stroke [facility])    left sided arm weakness    Past Surgical History:  Procedure Laterality Date   APPENDECTOMY     BACK SURGERY  2016   laminectomy and facetectomy    Social History   Social History Narrative   Not on file     reports that she quit smoking about 2 months ago. Her smoking use included cigarettes. She smoked an average of .5 packs per day. She has never used smokeless tobacco. She reports that she does not currently use alcohol. She reports that she does not use drugs.  Allergies  Allergen Reactions   Gabapentin     Bad dreams   Pregabalin     Drowsiness. Mind not clear.   Codeine Itching    No family history on file. Family history was reviewed and patient does not recall any relevant  family history  Prior to Admission medications   Medication Sig Start Date End Date Taking? Authorizing Provider  acetaminophen (TYLENOL) 325 MG tablet Take 650 mg by mouth every 6 (six) hours as needed for mild pain.   Yes [provider]  albuterol (VENTOLIN HFA) 108 (90 Base) MCG/ACT inhaler Inhale 1-2 puffs into the lungs every 6 (six) hours as needed for wheezing or shortness of breath. 01/18/19  Yes Gertie Baron, NP  alendronate (FOSAMAX) 70 MG tablet Take 70 mg by mouth once a week. Wednesday 10/03/20  Yes [provider]  amLODipine (NORVASC) 10 MG tablet Take 1 tablet (10 mg total) by mouth daily.  10/31/20  Yes Regalado, Belkys A, MD  aspirin 81 MG EC tablet Take 1 tablet by mouth daily. 11/02/04  Yes [provider]  atorvastatin (LIPITOR) 40 MG tablet Take 80 mg by mouth daily. 11/26/17  Yes [provider]  cholecalciferol (VITAMIN D3) 25 MCG (1000 UNIT) tablet Take 1,000 Units by mouth daily.   Yes [provider]  escitalopram (LEXAPRO) 20 MG tablet Take 20 mg by mouth daily. 01/03/21  Yes [provider]  glimepiride (AMARYL) 1 MG tablet Take 1 mg by mouth daily with breakfast. 11/05/17  Yes [provider]  ibuprofen (ADVIL) 200 MG tablet Take 400 mg by mouth every 6 (six) hours as needed.   Yes [provider]  insulin NPH Human (NOVOLIN N) 100 UNIT/ML injection Inject 0.13 mLs (13 Units total) into the skin 2 (two) times daily at 8 am and 10 pm. Patient taking differently: Inject 20-40 Units into the skin 2 (two) times daily at 8 am and 10 pm. Inject 40 in the morning and 20 units every evening 11/21/20  Yes Johnson, Clanford L, MD  loperamide (IMODIUM) 2 MG capsule Take 1 capsule (2 mg total) by mouth 3 (three) times daily as needed for diarrhea or loose stools. 11/21/20  Yes Johnson, Clanford L, MD  loratadine (CLARITIN) 10 MG tablet Take 10 mg by mouth daily.   Yes [provider]  losartan (COZAAR) 50 MG tablet Take 50 mg by mouth 2 (two) times daily. 12/08/20  Yes [provider]  nicotine (NICODERM CQ - DOSED IN MG/24 HOURS) 21 mg/24hr patch Place 1 patch (21 mg total) onto the skin daily. Patient taking differently: Place 7 mg onto the skin daily. 11/01/20  Yes Regalado, Belkys A, MD  potassium chloride SA (KLOR-CON) 20 MEQ tablet Take 40 mEq by mouth every morning. 01/04/21  Yes [provider]  rOPINIRole (REQUIP) 0.25 MG tablet Take 0.25 mg by mouth at bedtime. 10/06/20  Yes [provider]  potassium chloride SA (KLOR-CON) 10 MEQ tablet Take 1 tablet (10 mEq total) by mouth daily for 5  days. Patient not taking: Reported on 01/24/2021 11/21/20 11/26/20  Murlean Iba, MD    Physical Exam:  Constitutional: NAD, calm, comfortable Vitals:   01/24/21 2023 01/24/21 2100 01/24/21 2130 01/24/21 2200  BP: (!) 206/83 (!) 180/74 (!) 178/80 (!) 184/76  Pulse: 91 63 63 86  Resp: 18   20  Temp:      TempSrc:      SpO2: 98% 96% 95% 96%  Weight:      Height:       Eyes: PERRL, lids and conjunctivae normal ENMT: Mucous membranes are moist. Posterior pharynx clear of any exudate or lesions.Normal dentition.  Neck: normal, supple, no masses, no thyromegaly Respiratory: clear to auscultation bilaterally, no wheezing, no crackles. Normal  respiratory effort. No accessory muscle use.  Cardiovascular: Regular rate and rhythm, no murmurs / rubs / gallops. No extremity edema. 2+ pedal pulses. No carotid bruits.  Abdomen: no tenderness, no masses palpated. No hepatosplenomegaly. Bowel sounds positive.  Musculoskeletal: no clubbing / cyanosis. No joint deformity upper and lower extremities. Good ROM, no contractures. Normal muscle tone.  Skin: no rashes, lesions, ulcers. No induration Neurologic: CN 2-12 grossly intact. Sensation intact, DTR normal. Strength 3/5 L side 4/5 on right both UE and LE  Psychiatric:  Alert and oriented x 2. Normal mood.  Poor judgment and insight   Labs on Admission: I have personally reviewed following labs and imaging studies  CBC: Recent Labs  Lab 01/24/21 1749  WBC 5.6  NEUTROABS 3.5  HGB 13.9  HCT 40.9  MCV 84.7  PLT 478   Basic Metabolic Panel: Recent Labs  Lab 01/24/21 1749  NA 139  K 3.4*  CL 105  CO2 27  GLUCOSE 150*  BUN 9  CREATININE 0.71  CALCIUM 9.3   GFR: Estimated Creatinine Clearance: 57.3 mL/min (by C-G formula based on SCr of 0.71 mg/dL). Liver Function Tests: Recent Labs  Lab 01/24/21 1749  AST 13*  ALT 13  ALKPHOS 94  BILITOT 0.4  PROT 7.0  ALBUMIN 3.8   No results for input(s): LIPASE, AMYLASE in the last  168 hours. No results for input(s): AMMONIA in the last 168 hours. Coagulation Profile: Recent Labs  Lab 01/24/21 1749  INR 1.0   Urine analysis:    Component Value Date/Time   COLORURINE YELLOW 01/24/2021 1930   APPEARANCEUR CLEAR 01/24/2021 1930   APPEARANCEUR Clear 03/20/2014 2149   LABSPEC 1.016 01/24/2021 1930   LABSPEC 1.005 03/20/2014 2149   PHURINE 6.0 01/24/2021 1930   GLUCOSEU >=500 (A) 01/24/2021 1930   GLUCOSEU Negative 03/20/2014 2149   HGBUR MODERATE (A) 01/24/2021 1930   [place] NEGATIVE 01/24/2021 1930   BILIRUBINUR Negative 03/20/2014 2149   [place] NEGATIVE 01/24/2021 1930   PROTEINUR 30 (A) 01/24/2021 1930   NITRITE NEGATIVE 01/24/2021 1930   LEUKOCYTESUR NEGATIVE 01/24/2021 1930   LEUKOCYTESUR Negative 03/20/2014 2149    Radiological Exams on Admission: CT Head Wo Contrast  Result Date: 01/24/2021 CLINICAL DATA:  Transient ischemic attack (TIA). Multiple falls. Argumentative. EXAM: CT HEAD WITHOUT CONTRAST TECHNIQUE: Contiguous axial images were obtained from the base of the skull through the vertex without intravenous contrast. COMPARISON:  CT head 10/30/2020. BRAIN: BRAIN Patchy and confluent areas of decreased attenuation are noted throughout the deep and periventricular white matter of the cerebral hemispheres bilaterally, compatible with chronic microvascular ischemic disease. No evidence of large-territorial acute infarction. No parenchymal hemorrhage. Interval development of a 7 mm hyperdense lesion along the gray-white matter junction of the left frontal lobe. Query another similar finding measuring 2 mm along the right parietal gray-white matter junction. No extra-axial collection. No mass effect or midline shift. No hydrocephalus. Basilar cisterns are patent. Vascular: No hyperdense vessel. Atherosclerotic calcifications are present within the cavernous internal carotid arteries. Skull: No acute fracture or focal lesion. Sinuses/Orbits: Right  maxillary mucosal thickening. Paranasal sinuses and mastoid air cells are clear. The orbits are unremarkable. Other: None. IMPRESSION: Interval development of a 7 mm hyperdense lesion along the gray-white matter junction of the left frontal lobe. Query another similar finding measuring 2 mm along the right parietal lobe. Finding concerning for metastases. Electronically Signed   By: Iven Finn M.D.   On: 01/24/2021 17:45   MR ANGIO HEAD WO CONTRAST  Result Date: 01/24/2021 CLINICAL DATA:  Transient ischemic attack (TIA) EXAM: MRI HEAD WITHOUT CONTRAST MRA HEAD WITHOUT CONTRAST TECHNIQUE: Multiplanar, multi-echo pulse sequences of the brain and surrounding structures were acquired without intravenous contrast. Angiographic images of the Circle of Willis were acquired using MRA technique without intravenous contrast. COMPARISON:  August 2022 FINDINGS: MRI HEAD FINDINGS Motion artifact is present. Brain: There are minimal new punctate foci of diffusion hyperintensity in the left frontal lobe. Persistent but smaller area with peripheral diffusion hyperintensity in the posterior right frontal white matter. Additional patchy foci in the more anterior right frontal lobe appear to reflect T2 shine through at this point. There are numerous foci of susceptibility in the cerebrum with relative sparing of central structures reflecting chronic microhemorrhages. Patchy and confluent T2 hyperintensity in the supratentorial white matter is nonspecific but may reflect stable chronic microvascular ischemic changes. Vascular: Major vessel flow voids at the skull base are preserved. Skull and upper cervical spine: Normal marrow signal is preserved. Sinuses/Orbits: Paranasal sinuses are aerated. Orbits are unremarkable. Other: Sella is unremarkable.  Mastoid air cells are clear. MRA HEAD FINDINGS Motion artifact is present. Anterior circulation: Intracranial internal carotid arteries are patent. Anterior and middle cerebral  arteries are patent. Posterior circulation: Intracranial vertebral arteries are patent. Basilar artery is patent. Posterior cerebral arteries are patent. Probable distal left P2 PCA stenosis as was also noted on the prior MRA. IMPRESSION: Motion degraded studies. Minimal new punctate acute or subacute infarcts in the left frontal lobe. Area of rim diffusion hyperintensity in the posterior right frontal white matter remains present. This is unusual given length of time since prior study but has gotten smaller. Possible there has been recurrent ischemia at this location. Numerous chronic microhemorrhages in a pattern suggesting amyloid angiopathy. No new proximal intracranial vessel occlusion. Electronically Signed   By: Macy Mis M.D.   On: 01/24/2021 19:25   MR BRAIN WO CONTRAST  Result Date: 01/24/2021 CLINICAL DATA:  Transient ischemic attack (TIA) EXAM: MRI HEAD WITHOUT CONTRAST MRA HEAD WITHOUT CONTRAST TECHNIQUE: Multiplanar, multi-echo pulse sequences of the brain and surrounding structures were acquired without intravenous contrast. Angiographic images of the Circle of Willis were acquired using MRA technique without intravenous contrast. COMPARISON:  August 2022 FINDINGS: MRI HEAD FINDINGS Motion artifact is present. Brain: There are minimal new punctate foci of diffusion hyperintensity in the left frontal lobe. Persistent but smaller area with peripheral diffusion hyperintensity in the posterior right frontal white matter. Additional patchy foci in the more anterior right frontal lobe appear to reflect T2 shine through at this point. There are numerous foci of susceptibility in the cerebrum with relative sparing of central structures reflecting chronic microhemorrhages. Patchy and confluent T2 hyperintensity in the supratentorial white matter is nonspecific but may reflect stable chronic microvascular ischemic changes. Vascular: Major vessel flow voids at the skull base are preserved. Skull and  upper cervical spine: Normal marrow signal is preserved. Sinuses/Orbits: Paranasal sinuses are aerated. Orbits are unremarkable. Other: Sella is unremarkable.  Mastoid air cells are clear. MRA HEAD FINDINGS Motion artifact is present. Anterior circulation: Intracranial internal carotid arteries are patent. Anterior and middle cerebral arteries are patent. Posterior circulation: Intracranial vertebral arteries are patent. Basilar artery is patent. Posterior cerebral arteries are patent. Probable distal left P2 PCA stenosis as was also noted on the prior MRA. IMPRESSION: Motion degraded studies. Minimal new punctate acute or subacute infarcts in the left frontal lobe. Area of rim diffusion hyperintensity in the posterior right frontal white matter remains  present. This is unusual given length of time since prior study but has gotten smaller. Possible there has been recurrent ischemia at this location. Numerous chronic microhemorrhages in a pattern suggesting amyloid angiopathy. No new proximal intracranial vessel occlusion. Electronically Signed   By: Macy Mis M.D.   On: 01/24/2021 19:25    EKG: Independently reviewed. 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  CVA related to multiple cerebral arteries with infarction: Etiology is unclear.  Patient recently had complete evaluation in Augustincluding echocardiogram, carotid Dopplers, and MRI.  Dr. Raliegh Ip recommends obtaining CTA of the carotids.  Therefore CTA of the head and neck was ordered.  We will continue aspirin.  Recommend neurology evaluation in a.m. per Dr. Raliegh Ip.  -tPA can be given within 4.5 hours of symptom onset; this patient  was not deemed to be a candidate for tPA therapy due to CVA in the last 3 months; SBP >185; DBP >110; and that sx began last night -Aspirin has been given to reduce stroke mortality and decrease morbidity -Will admit to observation status for CVA/TIA evaluation -Telemetry monitoring -MRI/MRA done in emergency department -Carotid dopplers; if ipsilateral carotid stenosis is detected then prompt vascular surgery consultation is needed for consideration of CEA.  (Done in August) -Echo (done in August) -If the patient does not have known afib and this is not detected on telemetry during hospitalization, consider outpatient Holter monitoring and/or loop recorder placement. -Risk stratification with FLP, A1c; will also check TSH and UDS -Patient will need DAPT for 21 days when ABCD2 score is at least 4 and NIH score is 3 or less, and then can transition to monotherapy with a single antiplatelet agent.  Since this patient has failed primary prevention with ASA, transition to Plavix appears to be reasonable, however Plavix was discontinued in September.  Will defer to neurology for now. -Neurology consult -PT/OT/ST/Nutrition Consults -SW consult for placement  HTN -Allow permissive HTN for now -Treat BP only if >220/120, and then with goal of 15% reduction -Hold ARB and plan to restart in 48-72 hours    HLD -Check FLP -Resume statin but will change Pravachol to Lipitor 40 mg daily   DM -Recent A1c shows reasonably good control -Resume home p.o. meds -Will order moderate-scale SSI   -confusion: Patient is an extremely poor historian suspect that a power of attorney may need to be executed.   DVT prophylaxis:  Lovenox  Code Status: Full  Family Communication: No one present with patient Disposition Plan:  The patient is from: home  Anticipated d/c is to: home without [facility] services once her issues have been resolved.  Anticipated d/c date will depend on clinical response to treatment, but  possibly as early as tomorrow if she has excellent response to treatment  Patient is currently: acutely ill Consults called: Neurology;   It is my clinical opinion that referral for OBSERVATION is reasonable and necessary in this patient based on the above information provided. The aforementioned taken together are felt to place the patient at high risk for further clinical deterioration. However it is anticipated that the patient may be medically stable for discharge from the hospital within 24 to 48 hours.  Lady Deutscher MD FACP [facility] Pager [phone]  How to contact the [facility] Attending or Consulting provider [place] or covering provider during after hours [place], for this patient?  Check the care team in [facility] and look for a) attending/consulting [facility] provider listed and b) the [facility] team listed Log into [email or web address] and use [place]'s universal password to access. If you do not have the password, please contact the hospital operator. Locate the [facility] provider you are looking for under [facility] and page to a number that you can be directly reached. If you still have difficulty reaching the provider, please page the [facility] (Director on Call) for the Hospitalists listed on amion for assistance.  If 7PM-7AM, please contact night-coverage [email or web address] Password [facility]  01/24/2021, 10:09 PM

## 2021-01-24 NOTE — ED Notes (Signed)
Pt in mri 

## 2021-01-24 NOTE — ED Triage Notes (Signed)
Pt with hx of CVA.  Last seen normal 3 days ago.  Pt with multiple falls recently.  Yesterday confused per daughter and per daughter pt was argumentative. PT seen today and noted pt not her usual self. Pt denies any pain. Pt's daughter denies any residual left sided weakness.  Pt with new left sided weakness started yesterday. Daughter states pt has had about 5 falls over the past 2 days.

## 2021-01-25 ENCOUNTER — Encounter (HOSPITAL_COMMUNITY): Payer: Self-pay | Admitting: Internal Medicine

## 2021-01-25 ENCOUNTER — Other Ambulatory Visit: Payer: Self-pay

## 2021-01-25 ENCOUNTER — Inpatient Hospital Stay (HOSPITAL_COMMUNITY): Payer: Medicare HMO

## 2021-01-25 DIAGNOSIS — I10 Essential (primary) hypertension: Secondary | ICD-10-CM | POA: Diagnosis not present

## 2021-01-25 DIAGNOSIS — I639 Cerebral infarction, unspecified: Secondary | ICD-10-CM | POA: Diagnosis not present

## 2021-01-25 LAB — LIPID PANEL
Cholesterol: 216 mg/dL — ABNORMAL HIGH (ref 0–200)
HDL: 49 mg/dL (ref >40)
LDL Cholesterol: 144 mg/dL — ABNORMAL HIGH (ref 0–99)
Total CHOL/HDL Ratio: 4.4 RATIO
Triglycerides: 117 mg/dL (ref <150)
VLDL: 23 mg/dL (ref 0–40)

## 2021-01-25 LAB — HEMOGLOBIN A1C
Hgb A1c MFr Bld: 8.2 % — ABNORMAL HIGH (ref 4.8–5.6)
Mean Plasma Glucose: 188.64 mg/dL

## 2021-01-25 LAB — GLUCOSE, CAPILLARY
Glucose-Capillary: 160 mg/dL — ABNORMAL HIGH (ref 70–99)
Glucose-Capillary: 151 mg/dL — ABNORMAL HIGH (ref 70–99)
Glucose-Capillary: 78 mg/dL (ref 70–99)
Glucose-Capillary: 163 mg/dL — ABNORMAL HIGH (ref 70–99)

## 2021-01-25 LAB — RAPID URINE DRUG SCREEN, HOSP PERFORMED
Amphetamines: NOT DETECTED
Barbiturates: NOT DETECTED
Benzodiazepines: NOT DETECTED
Cocaine: NOT DETECTED
Opiates: NOT DETECTED
Tetrahydrocannabinol: NOT DETECTED

## 2021-01-25 LAB — TSH: TSH: 1.521 u[IU]/mL (ref 0.350–4.500)

## 2021-01-25 IMAGING — MR MR HEAD W/ CM
2 of 3 series · 21 of 48 positions shown · IV contrast (7 ml Gadavist)
Comparison: Noncontrast head MRI [DATE]

CLINICAL DATA: Stroke, follow-up.

EXAM:
MRI HEAD WITH CONTRAST
TECHNIQUE: Multiplanar, multiecho pulse sequences of the brain and surrounding
structures were obtained with intravenous contrast.
CONTRAST:  7mL GADAVIST GADOBUTROL 1 MMOL/ML IV SOLN

[Series 6: T1 post-contrast · coronal · 5.0mm · 0.34mm/px · 12 of 26 slices shown]
[im 1/26]
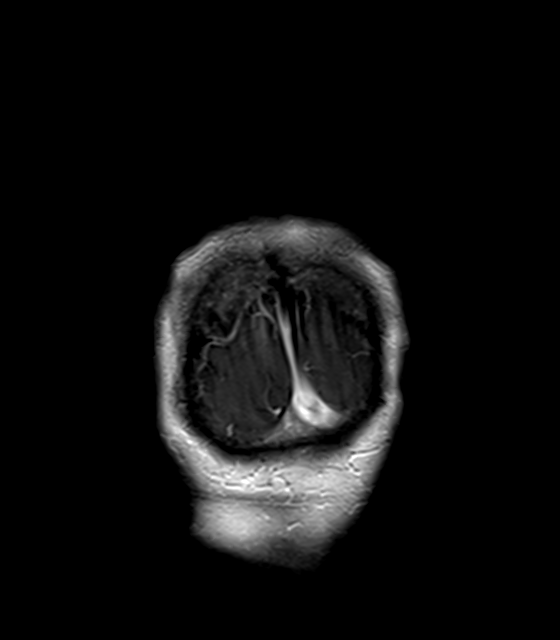
[im 3/26]
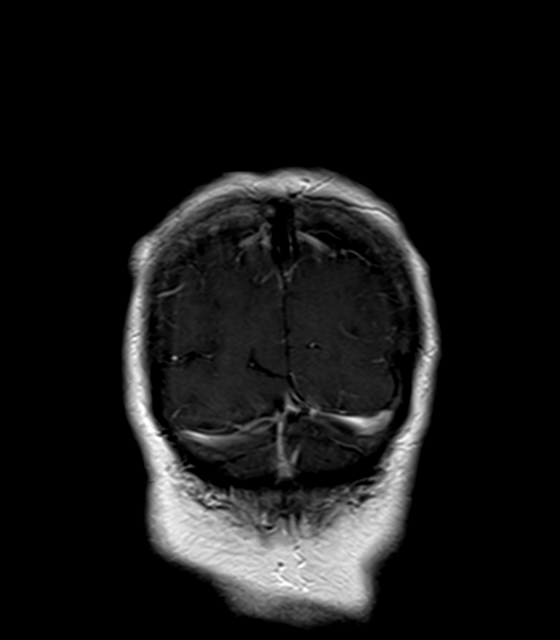
[im 5/26]
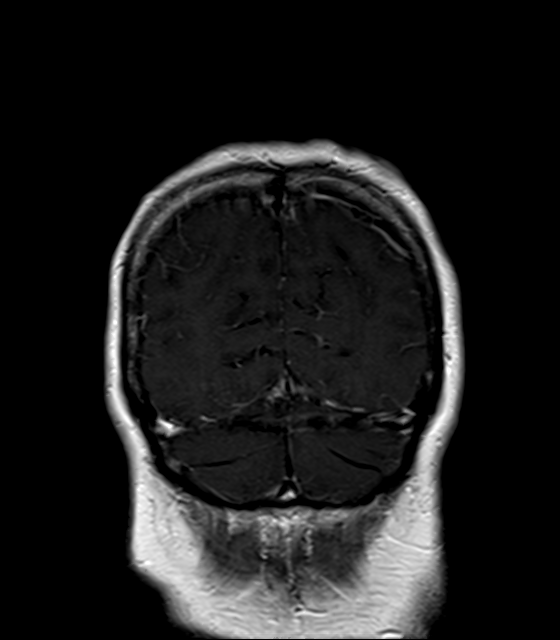
[im 7/26]
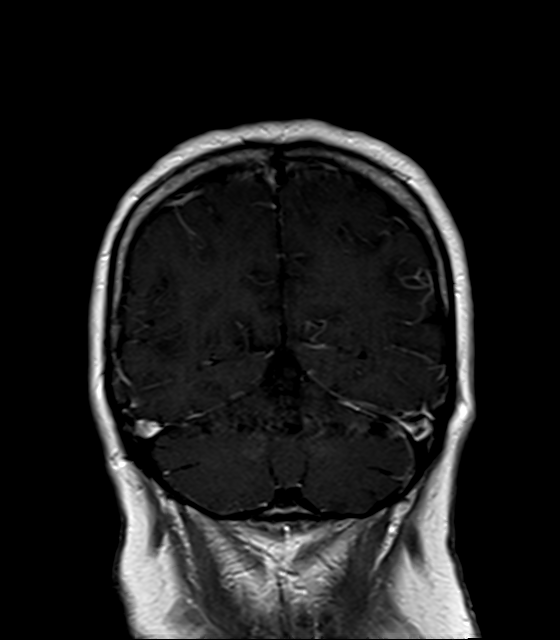
[im 10/26]
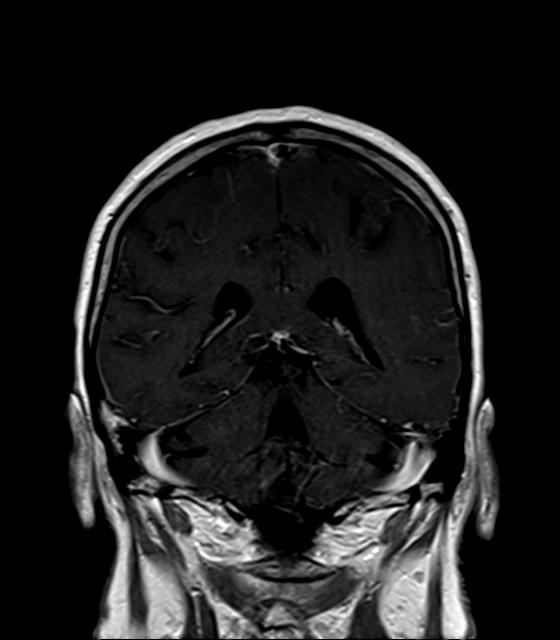
[im 12/26]
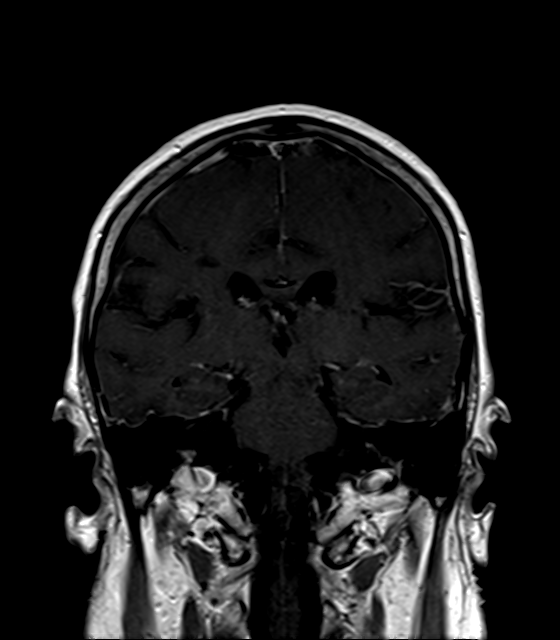
[im 14/26]
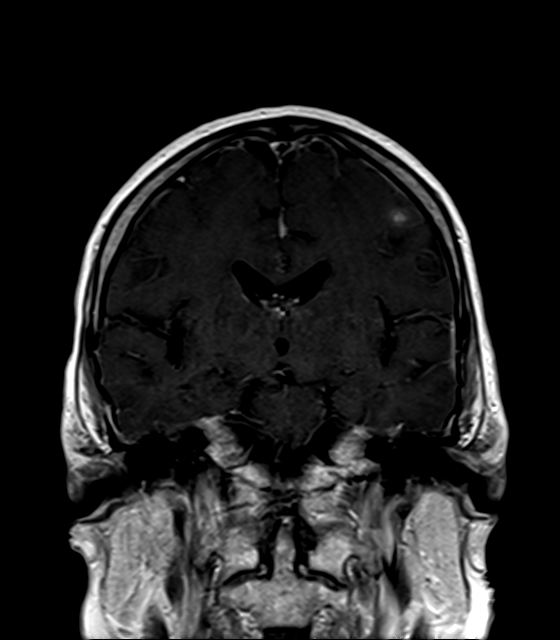
[im 16/26]
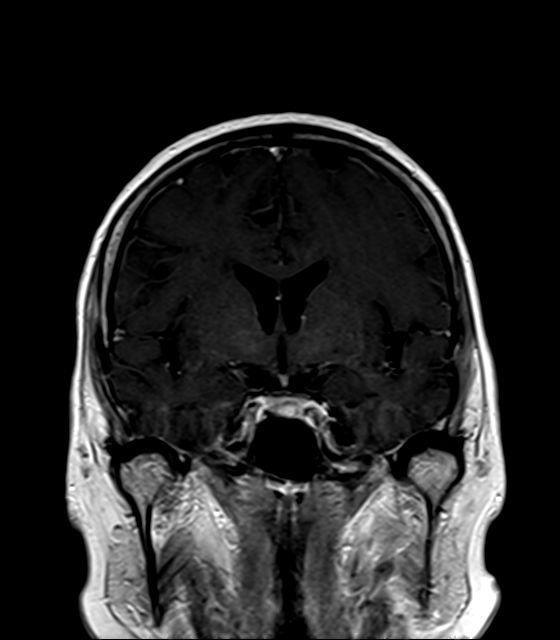
[im 19/26]
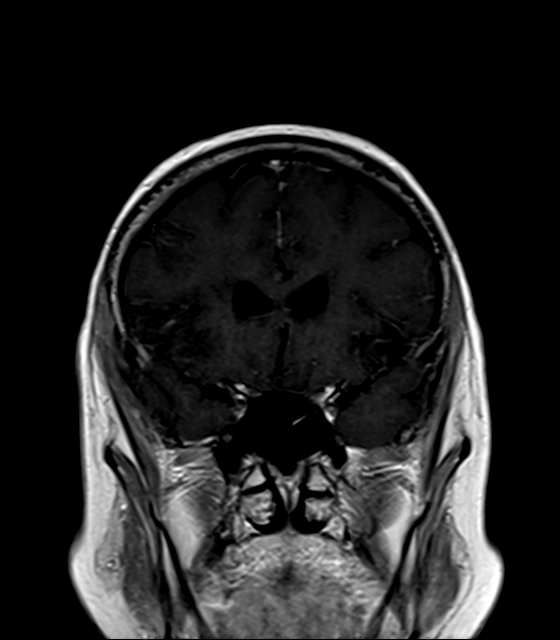
[im 21/26]
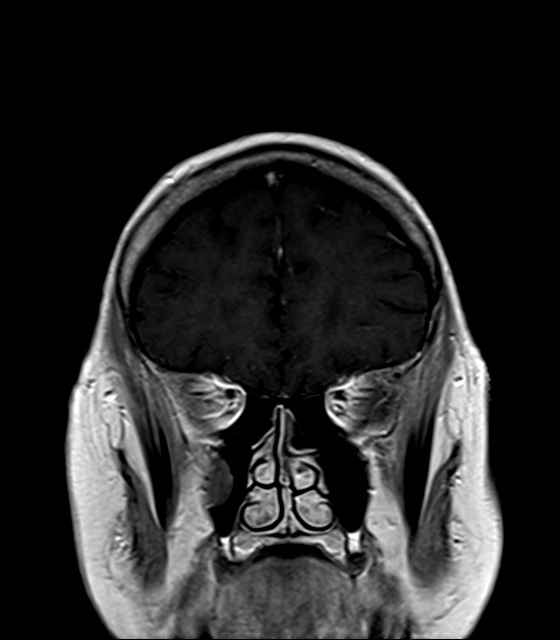
[im 23/26]
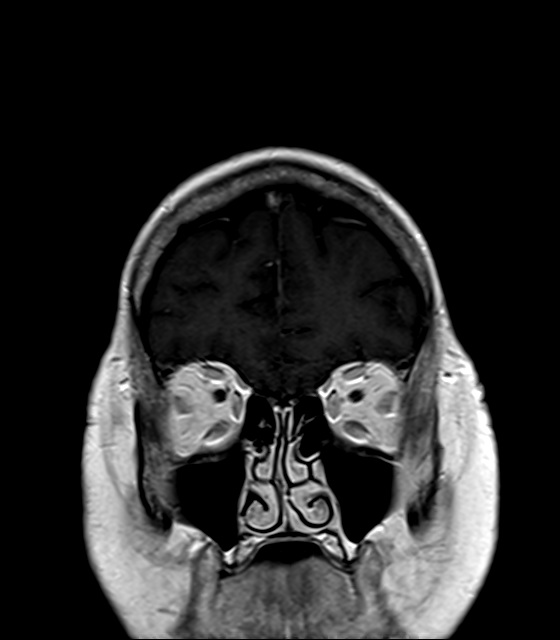
[im 26/26]
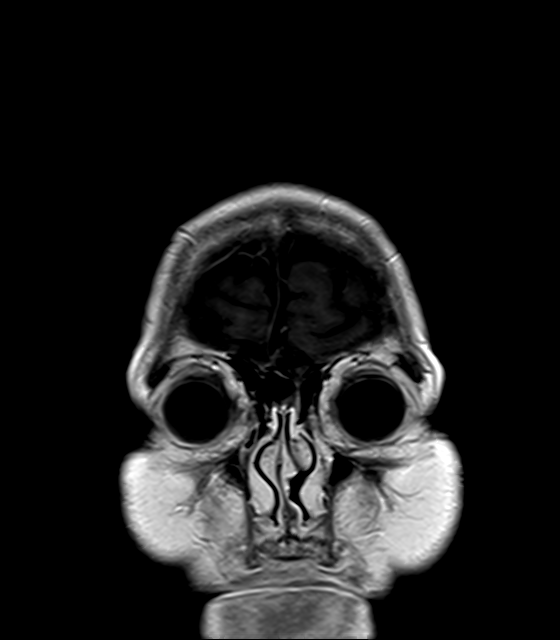

[Series 7: T1 · sagittal · 5.0mm · 0.94mm/px · 9 of 19 slices shown]
[im 1/19]
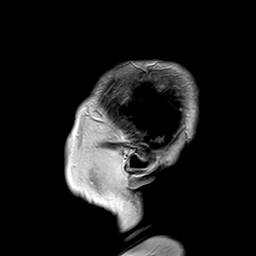
[im 3/19]
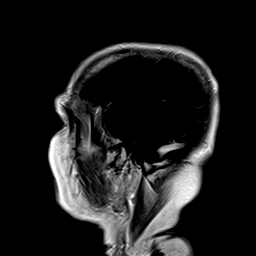
[im 5/19]
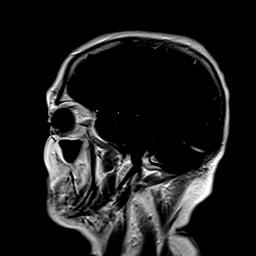
[im 7/19]
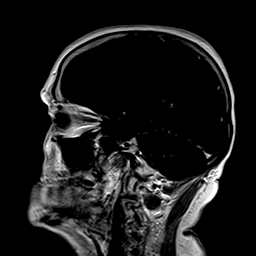
[im 10/19]
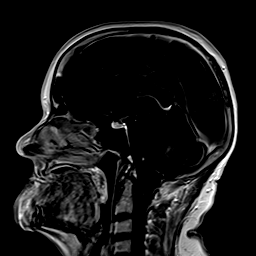
[im 12/19]
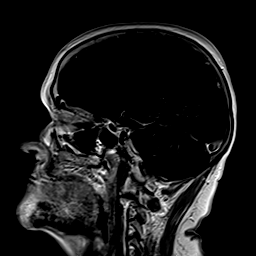
[im 14/19]
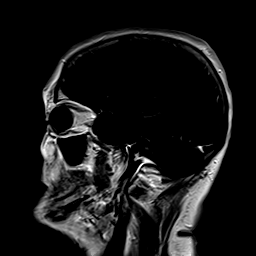
[im 16/19]
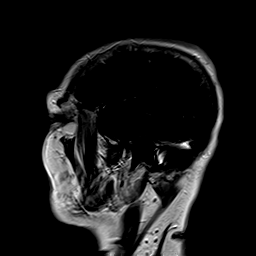
[im 19/19]
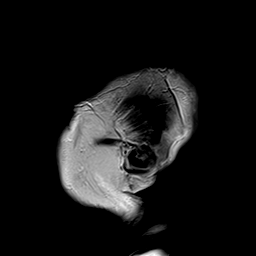

[21 of 48 positions shown; findings below may reference images not displayed]

FINDINGS: There is a 3 mm focus of cortical enhancement in the posterior left
frontal lobe which corresponds to one of the foci of diffusion
weighted signal abnormality on yesterday's noncontrast MRI. No
abnormal intracranial enhancement is identified elsewhere, including
associated with the posterior right frontal lesion described on the
noncontrast MRI favoring that this reflects an evolving chronic
infarct.
IMPRESSION: 1. 3 mm focus of enhancement in the posterior left frontal lobe,
likely a subacute infarct.
2. No abnormal intracranial enhancement elsewhere. No enhancement
associated with a presumed chronic infarct in the posterior right
frontal white matter.

## 2021-01-25 MED ORDER — GADOBUTROL 1 MMOL/ML IV SOLN
7.0000 mL | Freq: Once | INTRAVENOUS | Status: AC | PRN
  Administered 2021-01-25: 7 mL via INTRAVENOUS

## 2021-01-25 MED ORDER — HYDRALAZINE HCL 20 MG/ML IJ SOLN
10.0000 mg | Freq: Four times a day (QID) | INTRAMUSCULAR | Status: DC | PRN
  Administered 2021-01-25 – 2021-01-26 (×2): 10 mg via INTRAVENOUS
  Filled 2021-01-25 (×2): qty 1

## 2021-01-25 MED ORDER — LOSARTAN POTASSIUM 50 MG PO TABS
50.0000 mg | ORAL_TABLET | Freq: Every day | ORAL | Status: DC
  Administered 2021-01-25 – 2021-01-26 (×2): 50 mg via ORAL
  Filled 2021-01-25 (×2): qty 1

## 2021-01-25 MED ORDER — CLOPIDOGREL BISULFATE 75 MG PO TABS
75.0000 mg | ORAL_TABLET | Freq: Every day | ORAL | Status: DC
  Administered 2021-01-25 – 2021-01-26 (×2): 75 mg via ORAL
  Filled 2021-01-25 (×2): qty 1

## 2021-01-25 NOTE — Progress Notes (Signed)
OT Cancellation Note  Patient Details Name: Cheyenne [name] MRN: [id number] DOB: 29-May-1950   Cancelled Treatment:    Reason Eval/Treat Not Completed: OT screened, no needs identified, will sign off. Patient [name] functioning at baseline and is well known from previous admissions. She has all necessary support at home and family is able to provide assist for ADL tasks. Thank you for the referral .    Ailene Ravel, OTR/L,CBIS  [phone]  01/25/2021, 3:56 PM

## 2021-01-25 NOTE — Progress Notes (Signed)
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 She lives with family and has necessary support.)  Thank you,  Genene Churn, [place]  [place] 01/25/2021, 3:33 PM

## 2021-01-25 NOTE — Progress Notes (Signed)
01/25/2021 0135:  Attempted to receive report from [place], RN x1

## 2021-01-25 NOTE — TOC Initial Note (Signed)
Transition of Care [facility]) - Initial/Assessment Note    Patient Details  Name: Cheyenne [name] MRN: [id number] Date of Birth: Aug 11, 1950  Transition of Care [facility]) CM/SW Contact:    Shade Flood, LCSW Phone Number: 01/25/2021, 4:23 PM  Clinical Narrative:                  Pt admitted from home. She lives with family and plan is for return to home at dc. Pt is active with [facility] for RN/PT/OT. Resumption orders will need to be written for the [facility]. MD updated. Anticipating possible dc tomorrow.  TOC will follow.  Expected Discharge Plan: [place] Barriers to Discharge: Continued Medical Work up   Patient Goals and CMS Choice Patient states their goals for this hospitalization and ongoing recovery are:: get better CMS Medicare.gov Compare Post Acute Care list provided to:: Patient    Expected Discharge Plan and Services Expected Discharge Plan: [place] In-house Referral: Clinical Social Work   Post Acute Care Choice: Resumption of Svcs/PTA Provider Living arrangements for the past 2 months: Mobile Home Expected Discharge Date: 01/25/21                                    Prior Living Arrangements/Services Living arrangements for the past 2 months: Mobile Home Lives with:: Adult Children Patient language and need for interpreter reviewed:: Yes Do you feel safe going back to the place where you live?: Yes      Need for Family Participation in Patient Care: Yes (Comment) Care giver support system in place?: Yes (comment) Current home services: Home OT, Home PT, Home RN Criminal Activity/Legal Involvement Pertinent to Current Situation/Hospitalization: No - Comment as needed  Activities of Daily Living Home Assistive Devices/Equipment: Walker (specify type) (front wheel) ADL Screening (condition at time of admission) Patient's cognitive ability adequate to safely complete daily activities?: Yes Is the patient deaf or have  difficulty hearing?: No Does the patient have difficulty seeing, even when wearing glasses/contacts?: No Does the patient have difficulty concentrating, remembering, or making decisions?: No Patient able to express need for assistance with ADLs?: No Does the patient have difficulty dressing or bathing?: No Independently performs ADLs?: Yes (appropriate for developmental age) Does the patient have difficulty walking or climbing stairs?: No Weakness of Legs: Left Weakness of Arms/Hands: None  Permission Sought/Granted                  Emotional Assessment   Attitude/Demeanor/Rapport: Engaged Affect (typically observed): Pleasant Orientation: : Oriented to Self, Oriented to Situation Alcohol / Substance Use: Not Applicable Psych Involvement: No (comment)  Admission diagnosis:  Left-sided weakness [R53.1] 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 ([place]) [I63.50] Cerebrovascular accident (CVA), unspecified mechanism ([place]) [I63.9] Patient Active Problem List   Diagnosis Date Noted   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 ([place]) 01/24/2021   Protein-calorie malnutrition, severe 11/18/2020   Enterocolitis 11/10/2020   Abdominal pain 11/10/2020   Nausea & vomiting 11/10/2020   AKI (acute kidney injury) ([place]) 11/10/2020   Hyperglycemia due to diabetes mellitus ([place]) 11/10/2020   Leukocytosis 11/10/2020   Mixed hyperlipidemia 11/10/2020   Tobacco use 11/10/2020   Asthma 11/10/2020   SIRS (systemic inflammatory response syndrome) ([place]) 11/10/2020   Restless leg syndrome 11/10/2020   Vitamin D deficiency 11/10/2020   Enteritis 11/10/2020   Cerebrovascular accident (  CVA) ([place])    Generalized weakness 10/25/2020   Essential hypertension 10/25/2020   Type 2 diabetes mellitus with hyperlipidemia ([place]) 10/25/2020   Psychiatric problem 10/25/2020   PCP:  Toni Arthurs, PA Pharmacy:   [place], [place] [address] [place] [address] Phone: [phone] Fax: [phone]     Social Determinants of Health (SDOH) Interventions    Readmission Risk Interventions Readmission Risk Prevention Plan 01/25/2021  Medication Screening Complete  Transportation Screening Complete  Some encounter information is confidential and restricted. Go to Review Flowsheets activity to see all data.  Some recent data might be hidden

## 2021-01-25 NOTE — Progress Notes (Addendum)
PROGRESS NOTE     Cheyenne [name], is a 70 y.o. female, DOB - February 24, 1951, QMG:[id number]  Admit date - 01/24/2021   Admitting Physician Cheyenne Deutscher, MD  Outpatient Primary MD for the patient is Cheyenne [name], [place]  LOS - 1  Chief Complaint  Patient presents with   Extremity Weakness        Brief Narrative:  70 y.o. 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 Cheyenne [name]) Active Problems:   Essential hypertension   Type 2 diabetes mellitus with hyperlipidemia ([place])   Mixed hyperlipidemia   Restless leg syndrome   1) recurrent strokes--- concern for possible cardioembolic etiology -Patient with history of prior right frontal stroke and residual left-sided hemiparesis now presenting with new acute ischemic stroke with worsening left-sided weakness -Neurologist recommends:- repeat TTE due to repeat strokes with possible cardioembolic etiology -If TTE normal, recommend 30-day event monitor at the time of discharge -Recommend dual antiplatelet therapy with aspirin 81 mg daily and Plavix 75 mg daily for 3 weeks per POINT trial.  Then continue aspirin monotherapy -LDL 144 HDL 49 total cholesterol 216, continue atorvastatin 80 mg daily -MRI brain has atypical appearance with continued restricted diffusion which could be due to continued hypoperfusion.  -MRI brain with contrast pending to rule out any underlying lesion -TSH 1.5 -PT eval appreciated recommends home at PT  2)DM2--A1c 8.2.Marland Kitchen  Reflecting uncontrolled DM with hyperglycemia -Continue glimepiride and insulin regimen Use Novolog/Humalog Sliding scale insulin with Accu-Cheks/Fingersticks as ordered    3)HTN--- avoid hypotension, normotension is goal -Hold amlodipine -Restart losartan  may use IV Hydralazine 10 mg  Every 4 hours Prn for systolic blood pressure over 160 mmhg   Disposition/Need for in-[name] Stay- patient unable to be discharged at this time due to --recurrent acute strokes concern for cardioembolic event requiring further work-up -Possible discharge home on 01/26/2021 after MRI with contrast of the brain and echo and possible event monitor watch for arrhythmias on telemetry overnight*  Disposition: The patient is from: Home              Anticipated d/c is to: Home with [place] [name]              Anticipated d/c date is: 1 day              Patient currently is not medically stable to d/c. Barriers: Not Clinically Stable-   Code Status :  -  Code Status: Full Code   Family Communication:    NA (patient is alert, awake and coherent)   Consults  :  Neurology  DVT Prophylaxis  :   - SCDs   enoxaparin (LOVENOX) injection 40 mg Start: 01/24/21 2215    Lab Results  Component Value Date   PLT 207 01/24/2021    Inpatient Medications  Scheduled Meds:  aspirin EC  81 mg Oral Daily   atorvastatin  80 mg Oral Daily   clopidogrel  75 mg Oral Daily   enoxaparin (LOVENOX) injection  40 mg Subcutaneous QHS   escitalopram  20 mg Oral Daily   glimepiride  1 mg Oral Q breakfast   insulin aspart  0-15 Units Subcutaneous TID WC   insulin NPH Human  20 Units Subcutaneous  QHS   insulin NPH Human  40 Units Subcutaneous QAC breakfast   loratadine  10 mg Oral Daily   nicotine  7 mg Transdermal Daily   potassium chloride SA  40 mEq Oral q morning   rOPINIRole  0.25 mg Oral QHS   Continuous Infusions: PRN Meds:.acetaminophen, albuterol, hydrALAZINE, senna-docusate   Anti-infectives (From admission, onward)    None         Subjective: Cheyenne [name] today has no fevers, no emesis,  No chest pain,   -Left-sided weakness persist, -Mild dizziness   Objective: Vitals:    01/25/21 0200 01/25/21 0230 01/25/21 0518 01/25/21 1338  BP: (!) 193/91 (!) 205/92 (!) 157/82 (!) 189/90  Pulse: 69 87 71 81  Resp: 19 15 16 18   Temp:  98 F (36.7 C) 98 F (36.7 C) 98.5 F (36.9 C)  TempSrc:  Oral Oral Oral  SpO2: 98% 97% 97% 95%  Weight:  59.3 kg    Height:  5\' 4"  (1.626 m)      Intake/Output Summary (Last 24 hours) at 01/25/2021 1649 Last data filed at 01/25/2021 1332 Gross per 24 hour  Intake 1345.97 ml  Output --  Net 1345.97 ml   Filed Weights   01/24/21 1539 01/25/21 0230  Weight: 62.6 kg 59.3 kg    Physical Exam  Gen:- Awake Alert,  in no apparent distress  HEENT:- [place].AT, No sclera icterus Neck-Supple Neck,No JVD,.  Lungs-  CTAB , fair symmetrical air movement CV- S1, S2 normal, regular  Abd-  +ve B.Sounds, Abd Soft, No tenderness,    Extremity/Skin:- No  edema, pedal pulses present  Psych-affect is appropriate, oriented x3 Neuro-generalized weakness with worsening left-sided hemiparesis ,  no tremors  Data Reviewed: I have personally reviewed following labs and imaging studies  CBC: Recent Labs  Lab 01/24/21 1749  WBC 5.6  NEUTROABS 3.5  HGB 13.9  HCT 40.9  MCV 84.7  PLT 301   Basic Metabolic Panel: Recent Labs  Lab 01/24/21 1749  NA 139  K 3.4*  CL 105  CO2 27  GLUCOSE 150*  BUN 9  CREATININE 0.71  CALCIUM 9.3   GFR: Estimated Creatinine Clearance: 57.3 mL/min (by C-G formula based on SCr of 0.71 mg/dL). Liver Function Tests: Recent Labs  Lab 01/24/21 1749  AST 13*  ALT 13  ALKPHOS 94  BILITOT 0.4  PROT 7.0  ALBUMIN 3.8   No results for input(s): LIPASE, AMYLASE in the last 168 hours. No results for input(s): AMMONIA in the last 168 hours. Coagulation Profile: Recent Labs  Lab 01/24/21 1749  INR 1.0   Cardiac Enzymes: No results for input(s): CKTOTAL, CKMB, CKMBINDEX, TROPONINI in the last 168 hours. BNP (last 3 results) No results for input(s): PROBNP in the last 8760 hours. HbA1C: Recent Labs     01/25/21 0600  HGBA1C 8.2*   CBG: Recent Labs  Lab 01/25/21 0754 01/25/21 1142  GLUCAP 160* 151*   Lipid Profile: Recent Labs    01/25/21 0600  CHOL 216*  HDL 49  LDLCALC 144*  TRIG 117  CHOLHDL 4.4   Thyroid Function Tests: Recent Labs    01/24/21 1750  TSH 1.521   Anemia Panel: No results for input(s): VITAMINB12, FOLATE, FERRITIN, TIBC, IRON, RETICCTPCT in the last 72 hours. Urine analysis:    Component Value Date/Time   COLORURINE YELLOW 01/24/2021 1930   APPEARANCEUR CLEAR 01/24/2021 1930   APPEARANCEUR Clear 03/20/2014 2149   LABSPEC 1.016 01/24/2021 1930   LABSPEC 1.005 03/20/2014  2149   PHURINE 6.0 01/24/2021 1930   GLUCOSEU >=500 (A) 01/24/2021 1930   GLUCOSEU Negative 03/20/2014 2149   HGBUR MODERATE (A) 01/24/2021 1930   BILIRUBINUR NEGATIVE 01/24/2021 1930   BILIRUBINUR Negative 03/20/2014 2149   [place] NEGATIVE 01/24/2021 1930   PROTEINUR 30 (A) 01/24/2021 1930   NITRITE NEGATIVE 01/24/2021 1930   LEUKOCYTESUR NEGATIVE 01/24/2021 1930   LEUKOCYTESUR Negative 03/20/2014 2149   Sepsis Labs: @LABRCNTIP (procalcitonin:4,lacticidven:4)  ) Recent Results (from the past 240 hour(s))  Resp Panel by RT-PCR (Flu A&B, Covid) Nasopharyngeal Swab     Status: None   Collection Time: 01/24/21  7:30 PM   Specimen: Nasopharyngeal Swab; Nasopharyngeal(NP) swabs in vial transport medium  Result Value Ref Range Status   SARS Coronavirus 2 by RT PCR NEGATIVE NEGATIVE Final    Comment: (NOTE) SARS-CoV-2 target nucleic acids are NOT DETECTED.  The SARS-CoV-2 RNA is generally detectable in upper respiratory specimens during the acute phase of infection. The lowest concentration of SARS-CoV-2 viral copies this assay can detect is 138 copies/mL. A negative result does not preclude SARS-Cov-2 infection and should not be used as the sole basis for treatment or other patient management decisions. A negative result may occur with  improper specimen  collection/handling, submission of specimen other than nasopharyngeal swab, presence of viral mutation(s) within the areas targeted by this assay, and inadequate number of viral copies(<138 copies/mL). A negative result must be combined with clinical observations, patient history, and epidemiological information. The expected result is Negative.  Fact Sheet for Patients:  EntrepreneurPulse.com.au  Fact Sheet for Healthcare Providers:  IncredibleEmployment.be  This test is no t yet approved or cleared by the [place] FDA and  has been authorized for detection and/or diagnosis of SARS-CoV-2 by FDA under an Emergency Use Authorization (EUA). This EUA will remain  in effect (meaning this test can be used) for the duration of the COVID-19 declaration under Section 564(b)(1) of the Act, 21 U.S.C.section 360bbb-3(b)(1), unless the authorization is terminated  or revoked sooner.       Influenza A by PCR NEGATIVE NEGATIVE Final   Influenza B by PCR NEGATIVE NEGATIVE Final    Comment: (NOTE) The Xpert Xpress SARS-CoV-2/FLU/RSV plus assay is intended as an aid in the diagnosis of influenza from Nasopharyngeal swab specimens and should not be used as a sole basis for treatment. Nasal washings and aspirates are unacceptable for Xpert Xpress SARS-CoV-2/FLU/RSV testing.  Fact Sheet for Patients: EntrepreneurPulse.com.au  Fact Sheet for Healthcare Providers: IncredibleEmployment.be  This test is not yet approved or cleared by the [place] FDA and has been authorized for detection and/or diagnosis of SARS-CoV-2 by FDA under an Emergency Use Authorization (EUA). This EUA will remain in effect (meaning this test can be used) for the duration of the COVID-19 declaration under Section 564(b)(1) of the Act, 21 U.S.C. section 360bbb-3(b)(1), unless the authorization is terminated or revoked.  Performed at [facility], [address] [place], [place] [address]       Radiology Studies: CT ANGIO HEAD NECK W WO CM  Result Date: 01/24/2021 CLINICAL DATA:  Stroke/TIA, multiple recent falls, confusion EXAM: CT ANGIOGRAPHY HEAD AND NECK TECHNIQUE: Multidetector CT imaging of the head and neck was performed using the standard protocol during bolus administration of intravenous contrast. Multiplanar CT image reconstructions and MIPs were obtained to evaluate the vascular anatomy. Carotid stenosis measurements (when applicable) are obtained utilizing NASCET criteria, using the distal internal carotid diameter as the denominator. CONTRAST:  167mL OMNIPAQUE IOHEXOL 350  MG/ML SOLN COMPARISON:  01/24/2021 CT head and MRI head, 10/30/2020 CT head FINDINGS: CT HEAD FINDINGS For noncontrast findings, please see same day CT head. CTA NECK FINDINGS Aortic arch: Standard branching. Imaged portion shows no evidence of aneurysm or dissection. No significant stenosis of the major arch vessel origins. Right carotid system: No evidence of dissection, stenosis (50% or greater) or occlusion. Left carotid system: No evidence of dissection, stenosis (50% or greater) or occlusion. Vertebral arteries: Codominant. No evidence of dissection, stenosis (50% or greater) or occlusion. Skeleton: No acute osseous abnormality.  Edentulous. Other neck: Multiple peripherally enhancing lesions in the right thyroid lobe, the largest of which measures up to 1.2 cm (series 9, image 102). Upper chest: Paraseptal and centrilobular emphysema. No focal pulmonary opacity or pleural effusion. Review of the MIP images confirms the above findings CTA HEAD FINDINGS Anterior circulation: Both internal carotid arteries are patent to the termini, with mild calcifications but without stenosis or other abnormality. A1 segments patent. Normal anterior communicating artery. Anterior cerebral arteries are patent to their distal aspects. No M1 stenosis or occlusion. Normal MCA  bifurcations. Distal MCA branches perfused and symmetric. Posterior circulation: Vertebral arteries patent to the vertebrobasilar junction without stenosis. Posterior inferior cerebral arteries patent bilaterally. Basilar patent to its distal aspect. Superior cerebellar arteries patent bilaterally. PCAs perfused to their distal aspects without stenosis. Bilateral posterior communicating arteries are visualized. Venous sinuses: As permitted by contrast timing, patent. Anatomic variants: None significant Review of the MIP images confirms the above findings IMPRESSION: 1. No hemodynamically significant stenosis in the neck. 2. No intracranial large vessel occlusion or significant stenosis. 3. Multiple peripherally enhancing lesions in the right thyroid lobe. If these have not previously been evaluated with ultrasound, an ultrasound of the thyroid is recommended. 4.  Emphysema (ICD10-J43.9). Electronically Signed   By: Merilyn Baba M.D.   On: 01/24/2021 23:52   CT Head Wo Contrast  Result Date: 01/24/2021 CLINICAL DATA:  Transient ischemic attack (TIA). Multiple falls. Argumentative. EXAM: CT HEAD WITHOUT CONTRAST TECHNIQUE: Contiguous axial images were obtained from the base of the skull through the vertex without intravenous contrast. COMPARISON:  CT head 10/30/2020. BRAIN: BRAIN Patchy and confluent areas of decreased attenuation are noted throughout the deep and periventricular white matter of the cerebral hemispheres bilaterally, compatible with chronic microvascular ischemic disease. No evidence of large-territorial acute infarction. No parenchymal hemorrhage. Interval development of a 7 mm hyperdense lesion along the gray-white matter junction of the left frontal lobe. Query another similar finding measuring 2 mm along the right parietal gray-white matter junction. No extra-axial collection. No mass effect or midline shift. No hydrocephalus. Basilar cisterns are patent. Vascular: No hyperdense vessel.  Atherosclerotic calcifications are present within the cavernous internal carotid arteries. Skull: No acute fracture or focal lesion. Sinuses/Orbits: Right maxillary mucosal thickening. Paranasal sinuses and mastoid air cells are clear. The orbits are unremarkable. Other: None. IMPRESSION: Interval development of a 7 mm hyperdense lesion along the gray-white matter junction of the left frontal lobe. Query another similar finding measuring 2 mm along the right parietal lobe. Finding concerning for metastases. Electronically Signed   By: Iven Finn M.D.   On: 01/24/2021 17:45   MR ANGIO HEAD WO CONTRAST  Result Date: 01/24/2021 CLINICAL DATA:  Transient ischemic attack (TIA) EXAM: MRI HEAD WITHOUT CONTRAST MRA HEAD WITHOUT CONTRAST TECHNIQUE: Multiplanar, multi-echo pulse sequences of the brain and surrounding structures were acquired without intravenous contrast. Angiographic images of the Circle of Willis were acquired using MRA technique  without intravenous contrast. COMPARISON:  August 2022 FINDINGS: MRI HEAD FINDINGS Motion artifact is present. Brain: There are minimal new punctate foci of diffusion hyperintensity in the left frontal lobe. Persistent but smaller area with peripheral diffusion hyperintensity in the posterior right frontal white matter. Additional patchy foci in the more anterior right frontal lobe appear to reflect T2 shine through at this point. There are numerous foci of susceptibility in the cerebrum with relative sparing of central structures reflecting chronic microhemorrhages. Patchy and confluent T2 hyperintensity in the supratentorial white matter is nonspecific but may reflect stable chronic microvascular ischemic changes. Vascular: Major vessel flow voids at the skull base are preserved. Skull and upper cervical spine: Normal marrow signal is preserved. Sinuses/Orbits: Paranasal sinuses are aerated. Orbits are unremarkable. Other: Sella is unremarkable.  Mastoid air cells are  clear. MRA HEAD FINDINGS Motion artifact is present. Anterior circulation: Intracranial internal carotid arteries are patent. Anterior and middle cerebral arteries are patent. Posterior circulation: Intracranial vertebral arteries are patent. Basilar artery is patent. Posterior cerebral arteries are patent. Probable distal left P2 PCA stenosis as was also noted on the prior MRA. IMPRESSION: Motion degraded studies. Minimal new punctate acute or subacute infarcts in the left frontal lobe. Area of rim diffusion hyperintensity in the posterior right frontal white matter remains present. This is unusual given length of time since prior study but has gotten smaller. Possible there has been recurrent ischemia at this location. Numerous chronic microhemorrhages in a pattern suggesting amyloid angiopathy. No new proximal intracranial vessel occlusion. Electronically Signed   By: Macy Mis M.D.   On: 01/24/2021 19:25   MR BRAIN WO CONTRAST  Result Date: 01/24/2021 CLINICAL DATA:  Transient ischemic attack (TIA) EXAM: MRI HEAD WITHOUT CONTRAST MRA HEAD WITHOUT CONTRAST TECHNIQUE: Multiplanar, multi-echo pulse sequences of the brain and surrounding structures were acquired without intravenous contrast. Angiographic images of the Circle of Willis were acquired using MRA technique without intravenous contrast. COMPARISON:  August 2022 FINDINGS: MRI HEAD FINDINGS Motion artifact is present. Brain: There are minimal new punctate foci of diffusion hyperintensity in the left frontal lobe. Persistent but smaller area with peripheral diffusion hyperintensity in the posterior right frontal white matter. Additional patchy foci in the more anterior right frontal lobe appear to reflect T2 shine through at this point. There are numerous foci of susceptibility in the cerebrum with relative sparing of central structures reflecting chronic microhemorrhages. Patchy and confluent T2 hyperintensity in the supratentorial white matter is  nonspecific but may reflect stable chronic microvascular ischemic changes. Vascular: Major vessel flow voids at the skull base are preserved. Skull and upper cervical spine: Normal marrow signal is preserved. Sinuses/Orbits: Paranasal sinuses are aerated. Orbits are unremarkable. Other: Sella is unremarkable.  Mastoid air cells are clear. MRA HEAD FINDINGS Motion artifact is present. Anterior circulation: Intracranial internal carotid arteries are patent. Anterior and middle cerebral arteries are patent. Posterior circulation: Intracranial vertebral arteries are patent. Basilar artery is patent. Posterior cerebral arteries are patent. Probable distal left P2 PCA stenosis as was also noted on the prior MRA. IMPRESSION: Motion degraded studies. Minimal new punctate acute or subacute infarcts in the left frontal lobe. Area of rim diffusion hyperintensity in the posterior right frontal white matter remains present. This is unusual given length of time since prior study but has gotten smaller. Possible there has been recurrent ischemia at this location. Numerous chronic microhemorrhages in a pattern suggesting amyloid angiopathy. No new proximal intracranial vessel occlusion. Electronically Signed   By: Macy Mis  M.D.   On: 01/24/2021 19:25     Scheduled Meds:  aspirin EC  81 mg Oral Daily   atorvastatin  80 mg Oral Daily   clopidogrel  75 mg Oral Daily   enoxaparin (LOVENOX) injection  40 mg Subcutaneous QHS   escitalopram  20 mg Oral Daily   glimepiride  1 mg Oral Q breakfast   insulin aspart  0-15 Units Subcutaneous TID WC   insulin NPH Human  20 Units Subcutaneous QHS   insulin NPH Human  40 Units Subcutaneous QAC breakfast   loratadine  10 mg Oral Daily   nicotine  7 mg Transdermal Daily   potassium chloride SA  40 mEq Oral q morning   rOPINIRole  0.25 mg Oral QHS   Continuous Infusions:   LOS: 1 day    Roxan Hockey M.D on 01/25/2021 at 4:49 PM  Go to [email or web address] - for contact  info  [facility] - Office  [phone]  If 7PM-7AM, please contact night-coverage [email or web address] Password [facility] 01/25/2021, 4:49 PM

## 2021-01-25 NOTE — ED Notes (Signed)
Report attempted x 1

## 2021-01-25 NOTE — Consult Note (Addendum)
I connected with  Cheyenne [name] on 01/25/21 by a video enabled telemedicine application and verified that I am speaking with the correct person using two identifiers.   I discussed the limitations of evaluation and management by telemedicine. The patient expressed understanding and agreed to proceed.  Location of patient: [facility] Location of physician: [facility]  Neurology Consultation Reason for Consult: stroke Referring Physician: Dr Roxan Hockey  CC: Weakness  History is obtained from: Patient, chart review  HPI: Cheyenne [name] is a 70 y.o. female with past medical history of hypertension, diabetes, prior stroke with left residual weakness who presented with worsening weakness.  Patient is a poor historian.  Per chart review, patient's felt weak day before yesterday on the left side.  MRI brain was obtained which showed minimal new punctate acute or subacute infarcts in the left frontal lobe. Area of rim diffusion hyperintensity in the posterior right frontal white matter remains present.  Therefore neurology was consulted for further management.  Patient states she feels fine now.  States she has been taking aspirin daily.   Per granddaughter, last known normal around 9 PM on 01/24/2021 BP on arrival 158/76 mmhg Blood glucose 150  ROS: All other systems reviewed and negative except as noted in the HPI.   Past Medical History:  Diagnosis Date   Diabetes mellitus without complication ([place])    Endometrial cancer ([place])    details unavailable. brachytherapy seeds within region of cervix on CT imaging 2019   HTN (hypertension)    Peripheral neuropathy    Spinal stenosis    Stroke ([place])    left sided arm weakness    Family history: Patient denies history of stroke in family  Social History:  reports that she quit smoking about 2 months ago. Her smoking use included cigarettes. She smoked an average of .5 packs per day. She has never used smokeless  tobacco. She reports that she does not currently use alcohol. She reports that she does not use drugs.   Medications Prior to Admission  Medication Sig Dispense Refill Last Dose   acetaminophen (TYLENOL) 325 MG tablet Take 650 mg by mouth every 6 (six) hours as needed for mild pain.   unknown   albuterol (VENTOLIN HFA) 108 (90 Base) MCG/ACT inhaler Inhale 1-2 puffs into the lungs every 6 (six) hours as needed for wheezing or shortness of breath. 8 g 0 unknown   alendronate (FOSAMAX) 70 MG tablet Take 70 mg by mouth once a week. Wednesday   01/18/2021   amLODipine (NORVASC) 10 MG tablet Take 1 tablet (10 mg total) by mouth daily. 30 tablet 1 01/24/2021   aspirin 81 MG EC tablet Take 1 tablet by mouth daily.   01/24/2021   atorvastatin (LIPITOR) 40 MG tablet Take 80 mg by mouth daily.   01/24/2021   cholecalciferol (VITAMIN D3) 25 MCG (1000 UNIT) tablet Take 1,000 Units by mouth daily.   Past Week   escitalopram (LEXAPRO) 20 MG tablet Take 20 mg by mouth daily.   01/24/2021   glimepiride (AMARYL) 1 MG tablet Take 1 mg by mouth daily with breakfast.  5 01/24/2021   ibuprofen (ADVIL) 200 MG tablet Take 400 mg by mouth every 6 (six) hours as needed.   unknown   insulin NPH Human (NOVOLIN N) 100 UNIT/ML injection Inject 0.13 mLs (13 Units total) into the skin 2 (two) times daily at 8 am and 10 pm. (Patient taking differently: Inject 20-40 Units into the skin  2 (two) times daily at 8 am and 10 pm. Inject 40 in the morning and 20 units every evening) 10 mL 11 01/24/2021   loperamide (IMODIUM) 2 MG capsule Take 1 capsule (2 mg total) by mouth 3 (three) times daily as needed for diarrhea or loose stools. 30 capsule 0 unknown   loratadine (CLARITIN) 10 MG tablet Take 10 mg by mouth daily.   01/24/2021   losartan (COZAAR) 50 MG tablet Take 50 mg by mouth 2 (two) times daily.   01/24/2021   nicotine (NICODERM CQ - DOSED IN MG/24 HOURS) 21 mg/24hr patch Place 1 patch (21 mg total) onto the skin daily. (Patient  taking differently: Place 7 mg onto the skin daily.) 28 patch 0 Past Week   potassium chloride SA (KLOR-CON) 20 MEQ tablet Take 40 mEq by mouth every morning.   01/24/2021   rOPINIRole (REQUIP) 0.25 MG tablet Take 0.25 mg by mouth at bedtime.   01/23/2021   potassium chloride SA (KLOR-CON) 10 MEQ tablet Take 1 tablet (10 mEq total) by mouth daily for 5 days. (Patient not taking: Reported on 01/24/2021) 5 tablet 0 Not Taking      Exam: Current vital signs: BP (!) 157/82 (BP Location: Right Arm)   Pulse 71   Temp 98 F (36.7 C) (Oral)   Resp 16   Ht 5\' 4"  (1.626 m)   Wt 59.3 kg   SpO2 97%   BMI 22.44 kg/m  Vital signs in last 24 hours: Temp:  [97.6 F (36.4 C)-98 F (36.7 C)] 98 F (36.7 C) (11/23 0518) Pulse Rate:  [62-91] 71 (11/23 0518) Resp:  [15-21] 16 (11/23 0518) BP: (157-206)/(71-98) 157/82 (11/23 0518) SpO2:  [95 %-100 %] 97 % (11/23 0518) Weight:  [59.3 kg-62.6 kg] 59.3 kg (11/23 0230)   Physical Exam  Constitutional: Appears well-developed and well-nourished.  Psych: Affect appropriate to situation Respiratory: Effort normal Neuro: Awake, alert, oriented to time place and person, follows all commands, PERRLA, EOMI, no apparent facial asymmetry, left upper extremity antigravity strength with drift but does not drop to the bed, right upper extremity antigravity strength without drift, bilateral lower extremity antigravity without drift, unable to perform finger-to-nose with left upper extremity, no evidence of ataxia with finger-to-nose test with right upper extremity    I have reviewed labs in epic and the results pertinent to this consultation are: CBC:  Recent Labs  Lab 01/24/21 1749  WBC 5.6  NEUTROABS 3.5  HGB 13.9  HCT 40.9  MCV 84.7  PLT 093    Basic Metabolic Panel:  Lab Results  Component Value Date   NA 139 01/24/2021   K 3.4 (L) 01/24/2021   CO2 27 01/24/2021   GLUCOSE 150 (H) 01/24/2021   BUN 9 01/24/2021   CREATININE 0.71 01/24/2021    CALCIUM 9.3 01/24/2021   GFRNONAA >60 01/24/2021   GFRAA 49 (L) 11/27/2017   Lipid Panel:  Lab Results  Component Value Date   LDLCALC 144 (H) 01/25/2021   HgbA1c:  Lab Results  Component Value Date   HGBA1C 8.2 (H) 01/25/2021   Urine Drug Screen:     Component Value Date/Time   LABOPIA NONE DETECTED 01/24/2021 1930   COCAINSCRNUR NONE DETECTED 01/24/2021 1930   LABBENZ NONE DETECTED 01/24/2021 1930   AMPHETMU NONE DETECTED 01/24/2021 1930   THCU NONE DETECTED 01/24/2021 1930   LABBARB NONE DETECTED 01/24/2021 1930    Alcohol Level No results found for: [place]   I have reviewed the images obtained:  MRI brain without contrast 01/24/2021: Minimal new punctate acute or subacute infarcts in the left frontal lobe. Area of rim diffusion hyperintensity in the posterior right frontal white matter remains present. This is unusual given length of time since prior study but has gotten smaller. Possible there has been recurrent ischemia at this location. Numerous chronic microhemorrhages in a pattern suggesting amyloid angiopathy.       MRA head wo contrast 01/24/2021: Intracranial internal carotid arteries are patent. Anterior and middle cerebral arteries are patent.   Posterior circulation: Intracranial vertebral arteries are patent. Basilar artery is patent. Posterior cerebral arteries are patent. Probable distal left P2 PCA stenosis as was also noted on the prior MRA.  CTA H/N 01/24/2021: No hemodynamically significant stenosis in the neck. No intracranial large vessel occlusion or significant stenosis  TTE 10/25/2020: Left ventricular ejection fraction 60 to 65%, no wall motion abnormalities.  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   ASSESSMENT/PLAN: 70 year old female with prior right frontal stroke and residual left-sided weakness who presented with worsening left-sided weakness and MRI brain showed acute ischemic  stroke  Acute ischemic stroke -Etiology most likely cardioembolic (1 stroke in right frontal and one stroke in left frontal region) - LDL 144, A1c: 8.2  Recommendations: -We will repeat TTE due to repeat strokes with possible cardioembolic etiology -If TTE normal, recommend 30-day event monitor at the time of discharge -Recommend dual antiplatelet therapy with aspirin 81 mg daily and Plavix 75 mg daily for 3 weeks per POINT trial.  Then continue aspirin monotherapy -Continue atorvastatin 80 mg daily -MRI brain has atypical appearance with continued restricted diffusion which could be due to continued hypoperfusion.  However  will obtain MRI brain with contrast to rule out any underlying lesion -Goal normotension, avoid hypotension - Follow-up with neurology in 4 to 6 weeks.   -Stroke education with management of risk factors including hypertension, diabetes and hyperlipidemia -Management of rest of comorbidities per primary team  Thank you for allowing [name] to participate in the care of this patient. If you have any further questions, please contact  me or neurohospitalist.   Zeb Comfort Epile[facility] neurohospitalist

## 2021-01-25 NOTE — Evaluation (Addendum)
Physical Therapy Evaluation Patient Details Name: Cheyenne [name] MRN: [id number] DOB: 1950/12/05 Today's Date: 01/25/2021  History of Present Illness  Cheyenne [name] is a 70 y.o. female with medical history significant of type 2 diabetes mellitus: Stroke on November 10, 2020, hypertension, hyperlipidemia, COPD, and tobacco use who quit in September 2022 who presents to the emergency department due to increasing weakness noted yesterday on the left side.  She felt worse in her left arm she denies headaches change in visions paresthesias in the upper or lower extremities.  She was sent in at the request of her home health nurse.  The patient denies lightheadedness but notes that she has frequent falls as she gets off balance when going from a sitting to a standing position.  She does not use a walker or a cane.  She fell yesterday but did not hit her head or lose consciousness.  Has no associated fevers, chills, cough, body aches stomach pain nausea vomiting diarrhea.  Granddaughter stated that the patient has become very weak on her left side around 9 PM last night and is having difficulty with grabbing things and ambulating.  Granddaughter was concerned for possible stroke.  The patient has very poor memory and is having great difficulty giving any history.  I am concerned for her ability to make decisions for herself and question whether or not she needs a power of attorney activated.   Clinical Impression  Patient presents standing in room awake, alert, and agreeable for therapy, walking to the restroom. Patient functioning near baseline for functional mobility and gait other than a slightly slower cadence and decreased arm swing due to generalized weakness and poor vision. Patient ambulates in room and hallway Mod Independently, demonstrates a slow cadence, occasionally slightly unsteady on feet, no loss of balance, slight external rotation of L LE, decreased B arm swing. Patient demonstrated  coordination and strength deficits in L UE, demonstrated difficulty donning her mask. Patient tolerated sitting up in chair after therapy. Patient discharged to care of nursing for ambulation daily as tolerated for length of stay. All further physical therapy needs can be met at the next venue of care.     Recommendations for follow up therapy are one component of a multi-disciplinary discharge planning process, led by the attending physician.  Recommendations may be updated based on patient status, additional functional criteria and insurance authorization.  Follow Up Recommendations Home health PT    Assistance Recommended at Discharge Intermittent Supervision/Assistance  Functional Status Assessment Patient has had a recent decline in their functional status and demonstrates the ability to make significant improvements in function in a reasonable and predictable amount of time.  Equipment Recommendations  None recommended by PT    Recommendations for Other Services       Precautions / Restrictions Precautions Precautions: Fall Restrictions Weight Bearing Restrictions: No      Mobility  Bed Mobility Overal bed mobility: Modified Independent             General bed mobility comments: HOB flat, required increased time.    Transfers Overall transfer level: Modified independent Equipment used: None               General transfer comment: Slow movement, requires increased time, steady w/ transfers.    Ambulation/Gait Ambulation/Gait assistance: Modified independent (Device/Increase time) Gait Distance (Feet): 90 Feet Assistive device: None Gait Pattern/deviations: Step-through pattern;Decreased step length - right;Decreased step length - left;Decreased stride length Gait velocity: Mild decrease  General Gait Details: Demonstrated slow cadence, good base of support, occasionally slightly unsteady, no loss of balance, slight external rotation of L LE, decreased  arm swing, near baseline  Stairs            Wheelchair Mobility    Modified Rankin (Stroke Patients Only)       Balance Overall balance assessment: Modified Independent;Needs assistance Sitting-balance support: No upper extremity supported;Feet supported Sitting balance-Leahy Scale: Good Sitting balance - Comments: seated at EOB   Standing balance support: No upper extremity supported;During functional activity Standing balance-Leahy Scale: Good Standing balance comment: good w/out use of AD                             Pertinent Vitals/Pain Pain Assessment: Faces Faces Pain Scale: Hurts a little bit Pain Location: B shoulders during MMT Pain Descriptors / Indicators: Sore Pain Intervention(s): Limited activity within patient's tolerance;Monitored during session    Home Living Family/patient expects to be discharged to:: Private residence Living Arrangements: Other relatives;Other (Comment) (Lives w/ grandaughter) Available Help at Discharge: Family;Available 24 hours/day;Available PRN/intermittently;Other (Comment) (Has home health nurse) Type of Home: House Home Access: Stairs to enter Entrance Stairs-Rails: Right Entrance Stairs-Number of Steps: 4   Home Layout: One level Home Equipment: Conservation officer, nature (2 wheels);Cane - single point;Shower seat Additional Comments: Information per patient, who may be poor historian    Prior Function Prior Level of Function : Needs assist             Mobility Comments: Independent/Modified Independent household ambulator, does not drive due to poor vision, uses RW occasionally. ADLs Comments: Assisted by family and home nurse as needed.     Hand Dominance   Dominant Hand: Right    Extremity/Trunk Assessment   Upper Extremity Assessment Upper Extremity Assessment: Defer to OT evaluation    Lower Extremity Assessment Lower Extremity Assessment: Generalized weakness       Communication    Communication: No difficulties  Cognition Arousal/Alertness: Awake/alert Behavior During Therapy: WFL for tasks assessed/performed Overall Cognitive Status: Within Functional Limits for tasks assessed                                          General Comments      Exercises     Assessment/Plan    PT Assessment All further PT needs can be met in the next venue of care  PT Problem List Decreased strength;Decreased mobility;Decreased range of motion;Decreased coordination;Decreased activity tolerance;Decreased balance;Decreased knowledge of use of DME       PT Treatment Interventions      PT Goals (Current goals can be found in the Care Plan section)  Acute Rehab PT Goals Patient Stated Goal: Return home. PT Goal Formulation: With patient Time For Goal Achievement: 01/26/21 Potential to Achieve Goals: Good    Frequency     Barriers to discharge        Co-evaluation               AM-PAC PT "6 Clicks" Mobility  Outcome Measure Help needed turning from your back to your side while in a flat bed without using bedrails?: None Help needed moving from lying on your back to sitting on the side of a flat bed without using bedrails?: None Help needed moving to and from a bed to a chair (including a  wheelchair)?: None Help needed standing up from a chair using your arms (e.g., wheelchair or bedside chair)?: None Help needed to walk in hospital room?: A Little Help needed climbing 3-5 steps with a railing? : A Little 6 Click Score: 22    End of Session   Activity Tolerance: Patient tolerated treatment well Patient left: in chair;with call bell/phone within reach Nurse Communication: Mobility status PT Visit Diagnosis: Unsteadiness on feet (R26.81);Other abnormalities of gait and mobility (R26.89);Muscle weakness (generalized) (M62.81)    Time: 1219-7588 PT Time Calculation (min) (ACUTE ONLY): 22 min   Charges:   PT Evaluation $PT Eval Moderate  Complexity: 1 Mod PT Treatments $Therapeutic Activity: 8-22 mins        Cassie Jones, SPT  During this treatment session, the therapist was present, participating in and directing the treatment.  2:00 PM, 01/25/21 Lonell Grandchild, MPT Physical Therapist with [facility] [phone] office [phone] mobile phone

## 2021-01-25 NOTE — ED Notes (Signed)
Report attempted x2

## 2021-01-26 ENCOUNTER — Encounter (HOSPITAL_COMMUNITY): Payer: Self-pay | Admitting: Internal Medicine

## 2021-01-26 ENCOUNTER — Observation Stay (HOSPITAL_BASED_OUTPATIENT_CLINIC_OR_DEPARTMENT_OTHER): Payer: Medicare HMO

## 2021-01-26 DIAGNOSIS — I6389 Other cerebral infarction: Secondary | ICD-10-CM

## 2021-01-26 DIAGNOSIS — I10 Essential (primary) hypertension: Secondary | ICD-10-CM | POA: Diagnosis not present

## 2021-01-26 LAB — GLUCOSE, CAPILLARY
Glucose-Capillary: 61 mg/dL — ABNORMAL LOW (ref 70–99)
Glucose-Capillary: 101 mg/dL — ABNORMAL HIGH (ref 70–99)
Glucose-Capillary: 100 mg/dL — ABNORMAL HIGH (ref 70–99)
Glucose-Capillary: 161 mg/dL — ABNORMAL HIGH (ref 70–99)

## 2021-01-26 LAB — ECHOCARDIOGRAM COMPLETE BUBBLE STUDY
AR max vel: 2.33 cm2
AV Area VTI: 2.38 cm2
AV Area mean vel: 2.34 cm2
AV Mean grad: 2 mmHg
AV Peak grad: 4.7 mmHg
Ao pk vel: 1.08 m/s
Area-P 1/2: 5.13 cm2
Calc EF: 52.9 %
MV VTI: 3.68 cm2
S' Lateral: 2.4 cm
Single Plane A2C EF: 54.8 %
Single Plane A4C EF: 52.9 %

## 2021-01-26 LAB — BASIC METABOLIC PANEL
Anion gap: 7 (ref 5–15)
BUN: 6 mg/dL — ABNORMAL LOW (ref 8–23)
CO2: 23 mmol/L (ref 22–32)
Calcium: 9.2 mg/dL (ref 8.9–10.3)
Chloride: 107 mmol/L (ref 98–111)
Creatinine, Ser: 0.66 mg/dL (ref 0.44–1.00)
GFR, Estimated: >60 mL/min (ref >60)
Glucose, Bld: 59 mg/dL — ABNORMAL LOW (ref 70–99)
Potassium: 2.9 mmol/L — ABNORMAL LOW (ref 3.5–5.1)
Sodium: 137 mmol/L (ref 135–145)

## 2021-01-26 MED ORDER — NICOTINE 14 MG/24HR TD PT24: 14.0000 mg | MEDICATED_PATCH | Freq: Every day | TRANSDERMAL | 0 refills | Status: DC

## 2021-01-26 MED ORDER — ASPIRIN 81 MG PO TBEC: 81.0000 mg | DELAYED_RELEASE_TABLET | Freq: Every day | ORAL | 11 refills | Status: DC

## 2021-01-26 MED ORDER — CLOPIDOGREL BISULFATE 75 MG PO TABS: 75.0000 mg | ORAL_TABLET | Freq: Every day | ORAL | 0 refills | Status: DC

## 2021-01-26 MED ORDER — AMLODIPINE BESYLATE 5 MG PO TABS
5.0000 mg | ORAL_TABLET | Freq: Once | ORAL | Status: AC
  Administered 2021-01-26: 5 mg via ORAL
  Filled 2021-01-26: qty 1

## 2021-01-26 NOTE — Care Management CC44 (Signed)
Condition Code 44 Documentation Completed  Patient Details  Name: Cheyenne [name] MRN: [id number] Date of Birth: 1951/01/17   Condition Code 44 given:  Yes Patient signature on Condition Code 44 notice:  Yes Documentation of 2 MD's agreement:  Yes Code 44 added to claim:  Yes    Iona Beard, [place] 01/26/2021, 9:02 AM

## 2021-01-26 NOTE — Discharge Summary (Signed)
Cheyenne [name], is a 70 y.o. female  DOB 01-16-1951  MRN [id number].  Admission date:  01/24/2021  Admitting Physician  Lady Deutscher, MD  Discharge Date:  01/26/2021   Primary MD  Toni Arthurs, PA  Recommendations for primary care physician for things to follow:   1)Please take Aspirin 81 mg daily along with Plavix 75 mg daily for 30 days then after that STOP the Plavix  and continue ONLY Aspirin 81  mg daily indefinitely--for stroke prevention  2)Avoid ibuprofen/Advil/Aleve/Motrin/Goody Powders/Naproxen/BC powders/Meloxicam/Diclofenac/Indomethacin and other Nonsteroidal anti-inflammatory medications as these will make you more likely to bleed and can cause stomach ulcers, can also cause Kidney problems.   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 brachytherapy seeds within region of cervix on CT imaging 2019   HTN (hypertension)    Peripheral neuropathy    Spinal stenosis    Stroke [facility])    left sided arm weakness    Past Surgical History:  Procedure Laterality Date   APPENDECTOMY     BACK SURGERY  2016   laminectomy and facetectomy     HPI  from the history and physical done on the day of admission:   Chief Complaint: Sent in by her home health nurse who felt that she looked different and was not as strong as she was last week   HPI: Cheyenne [name] is a 70 y.o. female with medical history significant of type 2 diabetes mellitus: Stroke on November 10, 2020, hypertension, hyperlipidemia, COPD, and tobacco use who quit in September 2022 who presents to the emergency department due to increasing weakness noted yesterday on the left side.  She felt worse in her left arm she denies headaches change in visions paresthesias in the upper or lower extremities.  She was sent in at the request of her home health nurse.  The patient denies lightheadedness but notes that she has frequent falls as she gets off balance when going from a sitting to a standing position.  She does  not use a walker or a cane.  She fell yesterday but did not hit her head or lose consciousness.  Has no associated fevers, chills, cough, body aches stomach pain nausea vomiting diarrhea.  Granddaughter stated that the patient has become very weak on her left side around 9 PM last night and is having difficulty with grabbing things and ambulating.  Granddaughter was concerned for possible stroke.  The patient has very poor memory and is having great difficulty giving any history.  I am concerned for her ability to make decisions for herself and question whether or not she needs a power of attorney activated.   ED Course: In the emergency department initial CT scan look like possible metastatic disease and MRI and MRA was done which showed  new infarcts on the left and right sides of the brain.  Dr. Raliegh Ip from neurology was consulted who felt that she needed admission to admit to investigate why she is having more strokes.  She is currently taking all of her medicine and states that she has not resumed smoking.  Dr. Raliegh Ip recommended CTA of the carotids and a CT of the head and neck was ordered on admission for tomorrow.   Review of Systems: As per HPI otherwise all other systems reviewed and  negative.       Hospital Course:     Brief Narrative:  70 y.o. 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 [facility]) Active Problems:   Essential hypertension   Type 2 diabetes mellitus with hyperlipidemia ([facility])   Mixed hyperlipidemia   Restless leg syndrome     1) recurrent strokes--- concern for possible cardioembolic etiology -Patient with history of prior right frontal stroke and residual left-sided hemiparesis now presenting with new acute ischemic stroke with worsening left-sided weakness -Neurologist recommends:- repeat TTE due to repeat strokes with possible cardioembolic etiology -If TTE normal, recommend  event monitor at the time of discharge -Recommend dual antiplatelet therapy with aspirin 81 mg daily and Plavix 75 mg daily for 3 weeks per POINT trial.  Then continue aspirin monotherapy -LDL 144 HDL 49 total cholesterol 216, continue atorvastatin 80 mg daily -MRI brain has atypical appearance with continued restricted diffusion which could be due to continued hypoperfusion.  -MRI brain with contrast consistent with subacute stroke to rule out any underlying lesion -TSH 1.5 -PT eval appreciated recommends home at  PT -Echo with evidence of cardiac source of emboli, EF is preserved -Patient will need outpatient Zio patch/Holter monitor to rule out A. fib   2)DM2--A1c 8.2.Marland Kitchen  Reflecting uncontrolled DM with hyperglycemia PTA -Continue glimepiride and insulin regimen   3)HTN--- avoid hypotension, normotension is goal -Restart amlodipine -Restart losartan    Disposition--Home with home health services patient will need Zio patch/heart monitor  Disposition: The patient is from: Home              Anticipated d/c is to: Home with HH        Code Status :  -  Code Status: Full Code    Family Communication:    (patient is alert, awake and coherent)  -Called and updated patient's granddaughter Ms Lanna Poche   Consults  :  Neurology  Discharge Condition: stable  Follow UP   Follow-up Information     Toni Arthurs, [place]. Schedule an appointment as soon as possible for a visit in 1 week(s).   Specialty: Internal Medicine Contact information: [address] [place] [address] [phone]         Erma Heritage, PA-C Follow up in 1 week(s).   Specialties: Physician Assistant, Cardiology Contact information: [address] [place] [address] [phone]                  Consults obtained - neurology   Diet and Activity recommendation:  As advised  Discharge Instructions     Discharge Instructions     Call MD for:  difficulty breathing, headache or visual disturbances   Complete by: As directed    Call MD for:  persistant dizziness or light-headedness   Complete by: As directed    Call MD for:  persistant nausea and vomiting   Complete by: As directed    Call MD for:  temperature >100.4   Complete by: As directed    Diet - low sodium heart healthy   Complete by: As directed    Diet Carb Modified   Complete by: As directed    Discharge instructions   Complete by: As directed    1)Please take Aspirin 81 mg daily along with Plavix 75 mg  daily for 30 days then after that STOP the Plavix  and continue ONLY Aspirin 81  mg daily indefinitely--for stroke prevention  2)Avoid ibuprofen/Advil/Aleve/Motrin/Goody Powders/Naproxen/BC powders/Meloxicam/Diclofenac/Indomethacin and other Nonsteroidal anti-inflammatory medications as these will make you more likely to bleed and can cause stomach ulcers, can also cause Kidney problems.   3) you will need a heart monitor/Zio patch----to look for irregular heartbeat that may increase your risk for strokes  4) complete abstinence from tobacco strongly encouraged  5) a physical therapist to come to your house to help you with rehab and to help you get stronger   Increase activity slowly   Complete by: As directed          Discharge Medications     Allergies as of 01/26/2021       Reactions   Gabapentin    Bad dreams   Pregabalin    Drowsiness. Mind not clear.   Codeine Itching        Medication List     STOP taking these medications    ibuprofen 200 MG tablet Commonly known as: ADVIL   nicotine 21 mg/24hr patch Commonly known as: NICODERM CQ - dosed in mg/24 hours Replaced by: nicotine 14 mg/24hr patch       TAKE these medications    acetaminophen 325 MG tablet Commonly known as: TYLENOL Take 650 mg by mouth every 6 (six) hours as needed for mild pain.   albuterol 108 (90 Base) MCG/ACT inhaler Commonly known as: VENTOLIN HFA Inhale 1-2 puffs into the lungs every 6 (six) hours as needed for wheezing or shortness of breath.   alendronate 70 MG tablet Commonly known as: FOSAMAX Take 70 mg by mouth once a week. Wednesday   amLODipine 10 MG tablet Commonly known as: NORVASC Take 1 tablet (10 mg total) by mouth daily.   aspirin 81 MG EC tablet Take 1 tablet (81 mg total) by mouth daily. Swallow whole. Start taking on: January 27, 2021 What changed: additional instructions   atorvastatin 40 MG tablet Commonly known as: LIPITOR Take 80 mg by mouth  daily.   cholecalciferol 25  MCG (1000 UNIT) tablet Commonly known as: VITAMIN D3 Take 1,000 Units by mouth daily.   clopidogrel 75 MG tablet Commonly known as: PLAVIX Take 1 tablet (75 mg total) by mouth daily. Start taking on: January 27, 2021   escitalopram 20 MG tablet Commonly known as: LEXAPRO Take 20 mg by mouth daily.   glimepiride 1 MG tablet Commonly known as: AMARYL Take 1 mg by mouth daily with breakfast.   insulin NPH Human 100 UNIT/ML injection Commonly known as: NOVOLIN N Inject 0.13 mLs (13 Units total) into the skin 2 (two) times daily at 8 am and 10 pm. What changed:  how much to take additional instructions   loperamide 2 MG capsule Commonly known as: IMODIUM Take 1 capsule (2 mg total) by mouth 3 (three) times daily as needed for diarrhea or loose stools.   loratadine 10 MG tablet Commonly known as: CLARITIN Take 10 mg by mouth daily.   losartan 50 MG tablet Commonly known as: COZAAR Take 50 mg by mouth 2 (two) times daily.   nicotine 14 mg/24hr patch Commonly known as: NICODERM CQ - dosed in mg/24 hours Place 1 patch (14 mg total) onto the skin daily. Replaces: nicotine 21 mg/24hr patch   potassium chloride SA 20 MEQ tablet Commonly known as: KLOR-CON Take 40 mEq by mouth every morning. What changed: Another medication with the same name was removed. Continue taking this medication, and follow the directions you see here.   rOPINIRole 0.25 MG tablet Commonly known as: REQUIP Take 0.25 mg by mouth at bedtime.        Major procedures and Radiology Reports - PLEASE review detailed and final reports for all details, in brief -   CT ANGIO HEAD NECK W WO CM  Result Date: 01/24/2021 CLINICAL DATA:  Stroke/TIA, multiple recent falls, confusion EXAM: CT ANGIOGRAPHY HEAD AND NECK TECHNIQUE: Multidetector CT imaging of the head and neck was performed using the standard protocol during bolus administration of intravenous contrast. Multiplanar CT  image reconstructions and MIPs were obtained to evaluate the vascular anatomy. Carotid stenosis measurements (when applicable) are obtained utilizing NASCET criteria, using the distal internal carotid diameter as the denominator. CONTRAST:  1100mL OMNIPAQUE IOHEXOL 350 MG/ML SOLN COMPARISON:  01/24/2021 CT head and MRI head, 10/30/2020 CT head FINDINGS: CT HEAD FINDINGS For noncontrast findings, please see same day CT head. CTA NECK FINDINGS Aortic arch: Standard branching. Imaged portion shows no evidence of aneurysm or dissection. No significant stenosis of the major arch vessel origins. Right carotid system: No evidence of dissection, stenosis (50% or greater) or occlusion. Left carotid system: No evidence of dissection, stenosis (50% or greater) or occlusion. Vertebral arteries: Codominant. No evidence of dissection, stenosis (50% or greater) or occlusion. Skeleton: No acute osseous abnormality.  Edentulous. Other neck: Multiple peripherally enhancing lesions in the right thyroid lobe, the largest of which measures up to 1.2 cm (series 9, image 102). Upper chest: Paraseptal and centrilobular emphysema. No focal pulmonary opacity or pleural effusion. Review of the MIP images confirms the above findings CTA HEAD FINDINGS Anterior circulation: Both internal carotid arteries are patent to the termini, with mild calcifications but without stenosis or other abnormality. A1 segments patent. Normal anterior communicating artery. Anterior cerebral arteries are patent to their distal aspects. No M1 stenosis or occlusion. Normal MCA bifurcations. Distal MCA branches perfused and symmetric. Posterior circulation: Vertebral arteries patent to the vertebrobasilar junction without stenosis. Posterior inferior cerebral arteries patent bilaterally. Basilar patent to its distal aspect. Superior cerebellar  arteries patent bilaterally. PCAs perfused to their distal aspects without stenosis. Bilateral posterior communicating arteries  are visualized. Venous sinuses: As permitted by contrast timing, patent. Anatomic variants: None significant Review of the MIP images confirms the above findings IMPRESSION: 1. No hemodynamically significant stenosis in the neck. 2. No intracranial large vessel occlusion or significant stenosis. 3. Multiple peripherally enhancing lesions in the right thyroid lobe. If these have not previously been evaluated with ultrasound, an ultrasound of the thyroid is recommended. 4.  Emphysema (ICD10-J43.9). Electronically Signed   By: Merilyn Baba M.D.   On: 01/24/2021 23:52   CT Head Wo Contrast  Result Date: 01/24/2021 CLINICAL DATA:  Transient ischemic attack (TIA). Multiple falls. Argumentative. EXAM: CT HEAD WITHOUT CONTRAST TECHNIQUE: Contiguous axial images were obtained from the base of the skull through the vertex without intravenous contrast. COMPARISON:  CT head 10/30/2020. BRAIN: BRAIN Patchy and confluent areas of decreased attenuation are noted throughout the deep and periventricular white matter of the cerebral hemispheres bilaterally, compatible with chronic microvascular ischemic disease. No evidence of large-territorial acute infarction. No parenchymal hemorrhage. Interval development of a 7 mm hyperdense lesion along the gray-white matter junction of the left frontal lobe. Query another similar finding measuring 2 mm along the right parietal gray-white matter junction. No extra-axial collection. No mass effect or midline shift. No hydrocephalus. Basilar cisterns are patent. Vascular: No hyperdense vessel. Atherosclerotic calcifications are present within the cavernous internal carotid arteries. Skull: No acute fracture or focal lesion. Sinuses/Orbits: Right maxillary mucosal thickening. Paranasal sinuses and mastoid air cells are clear. The orbits are unremarkable. Other: None. IMPRESSION: Interval development of a 7 mm hyperdense lesion along the gray-white matter junction of the left frontal lobe.  Query another similar finding measuring 2 mm along the right parietal lobe. Finding concerning for metastases. Electronically Signed   By: Iven Finn M.D.   On: 01/24/2021 17:45   MR ANGIO HEAD WO CONTRAST  Result Date: 01/24/2021 CLINICAL DATA:  Transient ischemic attack (TIA) EXAM: MRI HEAD WITHOUT CONTRAST MRA HEAD WITHOUT CONTRAST TECHNIQUE: Multiplanar, multi-echo pulse sequences of the brain and surrounding structures were acquired without intravenous contrast. Angiographic images of the Circle of Willis were acquired using MRA technique without intravenous contrast. COMPARISON:  August 2022 FINDINGS: MRI HEAD FINDINGS Motion artifact is present. Brain: There are minimal new punctate foci of diffusion hyperintensity in the left frontal lobe. Persistent but smaller area with peripheral diffusion hyperintensity in the posterior right frontal white matter. Additional patchy foci in the more anterior right frontal lobe appear to reflect T2 shine through at this point. There are numerous foci of susceptibility in the cerebrum with relative sparing of central structures reflecting chronic microhemorrhages. Patchy and confluent T2 hyperintensity in the supratentorial white matter is nonspecific but may reflect stable chronic microvascular ischemic changes. Vascular: Major vessel flow voids at the skull base are preserved. Skull and upper cervical spine: Normal marrow signal is preserved. Sinuses/Orbits: Paranasal sinuses are aerated. Orbits are unremarkable. Other: Sella is unremarkable.  Mastoid air cells are clear. MRA HEAD FINDINGS Motion artifact is present. Anterior circulation: Intracranial internal carotid arteries are patent. Anterior and middle cerebral arteries are patent. Posterior circulation: Intracranial vertebral arteries are patent. Basilar artery is patent. Posterior cerebral arteries are patent. Probable distal left P2 PCA stenosis as was also noted on the prior MRA. IMPRESSION: Motion  degraded studies. Minimal new punctate acute or subacute infarcts in the left frontal lobe. Area of rim diffusion hyperintensity in the posterior right frontal white  matter remains present. This is unusual given length of time since prior study but has gotten smaller. Possible there has been recurrent ischemia at this location. Numerous chronic microhemorrhages in a pattern suggesting amyloid angiopathy. No new proximal intracranial vessel occlusion. Electronically Signed   By: Macy Mis M.D.   On: 01/24/2021 19:25   MR BRAIN WO CONTRAST  Result Date: 01/24/2021 CLINICAL DATA:  Transient ischemic attack (TIA) EXAM: MRI HEAD WITHOUT CONTRAST MRA HEAD WITHOUT CONTRAST TECHNIQUE: Multiplanar, multi-echo pulse sequences of the brain and surrounding structures were acquired without intravenous contrast. Angiographic images of the Circle of Willis were acquired using MRA technique without intravenous contrast. COMPARISON:  August 2022 FINDINGS: MRI HEAD FINDINGS Motion artifact is present. Brain: There are minimal new punctate foci of diffusion hyperintensity in the left frontal lobe. Persistent but smaller area with peripheral diffusion hyperintensity in the posterior right frontal white matter. Additional patchy foci in the more anterior right frontal lobe appear to reflect T2 shine through at this point. There are numerous foci of susceptibility in the cerebrum with relative sparing of central structures reflecting chronic microhemorrhages. Patchy and confluent T2 hyperintensity in the supratentorial white matter is nonspecific but may reflect stable chronic microvascular ischemic changes. Vascular: Major vessel flow voids at the skull base are preserved. Skull and upper cervical spine: Normal marrow signal is preserved. Sinuses/Orbits: Paranasal sinuses are aerated. Orbits are unremarkable. Other: Sella is unremarkable.  Mastoid air cells are clear. MRA HEAD FINDINGS Motion artifact is present. Anterior  circulation: Intracranial internal carotid arteries are patent. Anterior and middle cerebral arteries are patent. Posterior circulation: Intracranial vertebral arteries are patent. Basilar artery is patent. Posterior cerebral arteries are patent. Probable distal left P2 PCA stenosis as was also noted on the prior MRA. IMPRESSION: Motion degraded studies. Minimal new punctate acute or subacute infarcts in the left frontal lobe. Area of rim diffusion hyperintensity in the posterior right frontal white matter remains present. This is unusual given length of time since prior study but has gotten smaller. Possible there has been recurrent ischemia at this location. Numerous chronic microhemorrhages in a pattern suggesting amyloid angiopathy. No new proximal intracranial vessel occlusion. Electronically Signed   By: Macy Mis M.D.   On: 01/24/2021 19:25   MR BRAIN W CONTRAST  Result Date: 01/25/2021 CLINICAL DATA:  Stroke, follow-up. EXAM: MRI HEAD WITH CONTRAST TECHNIQUE: Multiplanar, multiecho pulse sequences of the brain and surrounding structures were obtained with intravenous contrast. CONTRAST:  21mL GADAVIST GADOBUTROL 1 MMOL/ML IV SOLN COMPARISON:  Noncontrast head MRI 01/24/2021 FINDINGS: There is a 3 mm focus of cortical enhancement in the posterior left frontal lobe which corresponds to one of the foci of diffusion weighted signal abnormality on yesterday's noncontrast MRI. No abnormal intracranial enhancement is identified elsewhere, including associated with the posterior right frontal lesion described on the noncontrast MRI favoring that this reflects an evolving chronic infarct. IMPRESSION: 1. 3 mm focus of enhancement in the posterior left frontal lobe, likely a subacute infarct. 2. No abnormal intracranial enhancement elsewhere. No enhancement associated with a presumed chronic infarct in the posterior right frontal white matter. Electronically Signed   By: Logan Bores M.D.   On: 01/25/2021  17:49   ECHOCARDIOGRAM COMPLETE BUBBLE STUDY  Result Date: 01/26/2021    ECHOCARDIOGRAM REPORT   Patient Name:   SUHANI STILLION Date of Exam: 01/26/2021 Medical Rec #:  [id number]        Height:       64.0 in Accession #:  [id number]       Weight:       130.7 lb Date of Birth:  12-06-1950        BSA:          1.633 m Patient Age:    55 years         BP:           177/79 mmHg Patient Gender: F                HR:           88 bpm. Exam Location:  [facility] Procedure: 2D Echo, Cardiac Doppler, Color Doppler and [place] Contrast Bubble            Study Indications:    Stroke  History:        Patient has prior history of Echocardiogram examinations, most                 recent 10/25/2020. Stroke and COPD; Risk Factors:Hypertension,                 Diabetes and Dyslipidemia. Tobacco Abuse.  Sonographer:    Wenda Low Referring Phys: [id number] [place]  1. Left ventricular ejection fraction, by estimation, is 60 to 65%. The left ventricle has normal function. The left ventricle has no regional wall motion abnormalities. There is mild left ventricular hypertrophy. Indeterminate diastolic filling due to E-A fusion.  2. Right ventricular systolic function is normal. The right ventricular size is normal. Tricuspid regurgitation signal is inadequate for assessing PA pressure.  3. The mitral valve is normal in structure. Trivial mitral valve regurgitation. No evidence of mitral stenosis.  4. The aortic valve is grossly normal. Aortic valve regurgitation is not visualized. No aortic stenosis is present.  5. The inferior vena cava is normal in size with greater than 50% respiratory variability, suggesting right atrial pressure of 3 mmHg.  6. Agitated saline contrast bubble study was negative, with no evidence of any interatrial shunt. Conclusion(s)/Recommendation(s): No intracardiac source of embolism detected on this transthoracic study. Consider a transesophageal echocardiogram to exclude cardiac  source of embolism if clinically indicated. FINDINGS  Left Ventricle: Left ventricular ejection fraction, by estimation, is 60 to 65%. The left ventricle has normal function. The left ventricle has no regional wall motion abnormalities. The left ventricular internal cavity size was normal in size. There is  mild left ventricular hypertrophy. Indeterminate diastolic filling due to E-A fusion. Right Ventricle: The right ventricular size is normal. No increase in right ventricular wall thickness. Right ventricular systolic function is normal. Tricuspid regurgitation signal is inadequate for assessing PA pressure. Left Atrium: Left atrial size was normal in size. Right Atrium: Right atrial size was normal in size. Pericardium: There is no evidence of pericardial effusion. Mitral Valve: The mitral valve is normal in structure. Trivial mitral valve regurgitation. No evidence of mitral valve stenosis. MV peak gradient, 3.5 mmHg. The mean mitral valve gradient is 1.0 mmHg. Tricuspid Valve: The tricuspid valve is normal in structure. Tricuspid valve regurgitation is trivial. No evidence of tricuspid stenosis. Aortic Valve: The aortic valve is grossly normal. Aortic valve regurgitation is not visualized. No aortic stenosis is present. Aortic valve mean gradient measures 2.0 mmHg. Aortic valve peak gradient measures 4.7 mmHg. Aortic valve area, by VTI measures 2.38 cm. Pulmonic Valve: The pulmonic valve was normal in structure. Pulmonic valve regurgitation is not visualized. No evidence of pulmonic stenosis. Aorta: The aortic root is normal in size  and structure. Venous: The inferior vena cava is normal in size with greater than 50% respiratory variability, suggesting right atrial pressure of 3 mmHg. IAS/Shunts: No atrial level shunt detected by color flow Doppler. Agitated saline contrast was given intravenously to evaluate for intracardiac shunting. Agitated saline contrast bubble study was negative, with no evidence of any  interatrial shunt.  LEFT VENTRICLE PLAX 2D LVIDd:         4.10 cm     Diastology LVIDs:         2.40 cm     LV e' medial:    5.66 cm/s LV PW:         1.10 cm     LV E/e' medial:  8.0 LV IVS:        1.20 cm     LV e' lateral:   4.90 cm/s LVOT diam:     1.90 cm     LV E/e' lateral: 9.3 LV SV:         51 LV SV Index:   31 LVOT Area:     2.84 cm  LV Volumes (MOD) LV vol d, MOD A2C: 30.3 ml LV vol d, MOD A4C: 40.1 ml LV vol s, MOD A2C: 13.7 ml LV vol s, MOD A4C: 18.9 ml LV SV MOD A2C:     16.6 ml LV SV MOD A4C:     40.1 ml LV SV MOD BP:      18.3 ml RIGHT VENTRICLE RV Basal diam:  2.35 cm RV Mid diam:    1.90 cm RV S prime:     15.00 cm/s TAPSE (M-mode): 2.0 cm LEFT ATRIUM           Index        RIGHT ATRIUM          Index LA diam:      3.30 cm 2.02 cm/m   RA Area:     9.17 cm LA Vol (A2C): 32.3 ml 19.78 ml/m  RA Volume:   20.40 ml 12.49 ml/m LA Vol (A4C): 47.2 ml 28.90 ml/m  AORTIC VALVE                    PULMONIC VALVE AV Area (Vmax):    2.33 cm     PV Vmax:       0.90 m/s AV Area (Vmean):   2.34 cm     PV Peak grad:  3.3 mmHg AV Area (VTI):     2.38 cm AV Vmax:           108.00 cm/s AV Vmean:          70.500 cm/s AV VTI:            0.213 m AV Peak Grad:      4.7 mmHg AV Mean Grad:      2.0 mmHg LVOT Vmax:         88.80 cm/s LVOT Vmean:        58.100 cm/s LVOT VTI:          0.179 m LVOT/AV VTI ratio: 0.84  AORTA Ao Root diam: 2.70 cm MITRAL VALVE MV Area (PHT): 5.13 cm    SHUNTS MV Area VTI:   3.68 cm    Systemic VTI:  0.18 m MV Peak grad:  3.5 mmHg    Systemic Diam: 1.90 cm MV Mean grad:  1.0 mmHg MV Vmax:       0.93 m/s MV Vmean:      46.5  cm/s MV Decel Time: 148 msec MV E velocity: 45.40 cm/s MV A velocity: 87.80 cm/s MV E/A ratio:  0.52 Cherlynn Kaiser MD Electronically signed by Cherlynn Kaiser MD Signature Date/Time: 01/26/2021/6:02:02 PM    Final     Micro Results   Recent Results (from the past 240 hour(s))  Resp Panel by RT-PCR (Flu A&B, Covid) Nasopharyngeal Swab     Status: None    Collection Time: 01/24/21  7:30 PM   Specimen: Nasopharyngeal Swab; Nasopharyngeal(NP) swabs in vial transport medium  Result Value Ref Range Status   SARS Coronavirus 2 by RT PCR NEGATIVE NEGATIVE Final    Comment: (NOTE) SARS-CoV-2 target nucleic acids are NOT DETECTED.  The SARS-CoV-2 RNA is generally detectable in upper respiratory specimens during the acute phase of infection. The lowest concentration of SARS-CoV-2 viral copies this assay can detect is 138 copies/mL. A negative result does not preclude SARS-Cov-2 infection and should not be used as the sole basis for treatment or other patient management decisions. A negative result may occur with  improper specimen collection/handling, submission of specimen other than nasopharyngeal swab, presence of viral mutation(s) within the areas targeted by this assay, and inadequate number of viral copies(<138 copies/mL). A negative result must be combined with clinical observations, patient history, and epidemiological information. The expected result is Negative.  Fact Sheet for Patients:  EntrepreneurPulse.com.au  Fact Sheet for Healthcare Providers:  IncredibleEmployment.be  This test is no t yet approved or cleared by the [place] FDA and  has been authorized for detection and/or diagnosis of SARS-CoV-2 by FDA under an Emergency Use Authorization (EUA). This EUA will remain  in effect (meaning this test can be used) for the duration of the COVID-19 declaration under Section 564(b)(1) of the Act, 21 U.S.C.section 360bbb-3(b)(1), unless the authorization is terminated  or revoked sooner.       Influenza A by PCR NEGATIVE NEGATIVE Final   Influenza B by PCR NEGATIVE NEGATIVE Final    Comment: (NOTE) The Xpert Xpress SARS-CoV-2/FLU/RSV plus assay is intended as an aid in the diagnosis of influenza from Nasopharyngeal swab specimens and should not be used as a sole basis for treatment.  Nasal washings and aspirates are unacceptable for Xpert Xpress SARS-CoV-2/FLU/RSV testing.  Fact Sheet for Patients: EntrepreneurPulse.com.au  Fact Sheet for Healthcare Providers: IncredibleEmployment.be  This test is not yet approved or cleared by the [place] FDA and has been authorized for detection and/or diagnosis of SARS-CoV-2 by FDA under an Emergency Use Authorization (EUA). This EUA will remain in effect (meaning this test can be used) for the duration of the COVID-19 declaration under Section 564(b)(1) of the Act, 21 U.S.C. section 360bbb-3(b)(1), unless the authorization is terminated or revoked.  Performed at [facility], [address], [place], [place] [address]        Today   Subjective    Rikki Trosper today has no new complaints  No chest pains, no headaches, -Some residual left-sided weakness persist No fever  Or chills   No Nausea, Vomiting or Diarrhea        Patient has been seen and examined prior to discharge   Objective   Blood pressure (!) 174/76, pulse (!) 110, temperature 98.2 F (36.8 C), temperature source Oral, resp. rate 18, height 5\' 4"  (1.626 m), weight 59.3 kg, SpO2 98 %.   Intake/Output Summary (Last 24 hours) at 01/26/2021 1906 Last data filed at 01/26/2021 1300 Gross per 24 hour  Intake 1360 ml  Output --  Net  1360 ml    Exam Gen:- Awake Alert,  in no apparent distress  HEENT:- [place].AT, No sclera icterus Neck-Supple Neck,No JVD,.  Lungs-  CTAB , fair symmetrical air movement CV- S1, S2 normal, regular  Abd-  +ve B.Sounds, Abd Soft, No tenderness,    Extremity/Skin:- No  edema, pedal pulses present  Psych-affect is appropriate, oriented x3 Neuro-generalized weakness with worsening left-sided hemiparesis ,  no tremors   Data Review   CBC w Diff:  Lab Results  Component Value Date   WBC 5.6 01/24/2021   HGB 13.9 01/24/2021   HGB 15.4 03/20/2014   HCT 40.9 01/24/2021   HCT  46.3 03/20/2014   PLT 207 01/24/2021   PLT 194 03/20/2014   LYMPHOPCT 28 01/24/2021   LYMPHOPCT 24.9 03/20/2014   MONOPCT 7 01/24/2021   MONOPCT 6.7 03/20/2014   EOSPCT 0 01/24/2021   EOSPCT 0.1 03/20/2014   BASOPCT 1 01/24/2021   BASOPCT 0.8 03/20/2014    CMP:  Lab Results  Component Value Date   NA 137 01/26/2021   NA 139 03/20/2014   K 2.9 (L) 01/26/2021   K 3.9 03/20/2014   CL 107 01/26/2021   CL 104 03/20/2014   CO2 23 01/26/2021   CO2 30 03/20/2014   BUN 6 (L) 01/26/2021   BUN 15 03/20/2014   CREATININE 0.66 01/26/2021   CREATININE 0.71 03/20/2014   PROT 7.0 01/24/2021   PROT 7.5 03/20/2014   ALBUMIN 3.8 01/24/2021   ALBUMIN 3.8 03/20/2014   BILITOT 0.4 01/24/2021   BILITOT 0.3 03/20/2014   ALKPHOS 94 01/24/2021   ALKPHOS 71 03/20/2014   AST 13 (L) 01/24/2021   AST 18 03/20/2014   ALT 13 01/24/2021   ALT 18 03/20/2014  .   Total Discharge time is about 33 minutes  Roxan Hockey M.D on 01/26/2021 at 7:06 PM  Go to [email or web address] -  for contact info  [facility] - Office  [phone]

## 2021-01-26 NOTE — Progress Notes (Incomplete)
*  PRELIMINARY RESULTS* Echocardiogram 2D Echocardiogram has been performed.  Cheyenne [name] 01/26/2021, 11:23 AM

## 2021-01-26 NOTE — Care Management Obs Status (Signed)
[place] NOTIFICATION   Patient Details  Name: Cheyenne [name] MRN: [id number] Date of Birth: 1950/08/19   Medicare Observation Status Notification Given:  Yes    Iona Beard, [place] 01/26/2021, 9:02 AM

## 2021-01-26 NOTE — Progress Notes (Signed)
01/26/2021 1922:  Pt discharged with belongings. AVS and discharge instructions given to pt. Pt granddaughter provided transportation.

## 2021-01-26 NOTE — Discharge Instructions (Signed)
1)Please take Aspirin 81 mg daily along with Plavix 75 mg daily for 30 days then after that STOP the Plavix  and continue ONLY Aspirin 81  mg daily indefinitely--for stroke prevention  2)Avoid ibuprofen/Advil/Aleve/Motrin/Goody Powders/Naproxen/BC powders/Meloxicam/Diclofenac/Indomethacin and other Nonsteroidal anti-inflammatory medications as these will make you more likely to bleed and can cause stomach ulcers, can also cause Kidney problems.   3) you will need a heart monitor/Zio patch----to look for irregular heartbeat that may increase your risk for strokes  4) complete abstinence from tobacco strongly encouraged  5)A physical therapist to come to your house to help you with rehab and to help you get stronger

## 2021-02-01 ENCOUNTER — Telehealth: Payer: Self-pay | Admitting: *Deleted

## 2021-02-01 ENCOUNTER — Ambulatory Visit: Payer: Medicare HMO

## 2021-02-01 ENCOUNTER — Other Ambulatory Visit: Payer: Self-pay | Admitting: Family Medicine

## 2021-02-01 DIAGNOSIS — I639 Cerebral infarction, unspecified: Secondary | ICD-10-CM

## 2021-02-01 NOTE — Telephone Encounter (Signed)
Order placed. Called patient to notify. No answer, left msg to call back.

## 2021-02-01 NOTE — Telephone Encounter (Signed)
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 Denton Brick (Hospitalist). Already discharged from the hospital.   Thanks,  [place]  ----- Message ----- From: Roxan Hockey, MD Sent: 01/26/2021   6:07 PM EST To: Erma Heritage, PA-C Subject: Recurrent Strokes --- needs Zio Patch           -Recurrent Strokes --- needs Zio Patch  Needs Zio patch   Thank you  Roxan Hockey, MD

## 2021-02-02 ENCOUNTER — Emergency Department (HOSPITAL_COMMUNITY): Payer: Medicare HMO

## 2021-02-02 ENCOUNTER — Other Ambulatory Visit: Payer: Self-pay

## 2021-02-02 ENCOUNTER — Inpatient Hospital Stay (HOSPITAL_COMMUNITY)
Admission: EM | Admit: 2021-02-02 | Discharge: 2021-02-07 | DRG: 690 | Disposition: A | Discharge status: Home Health | Payer: Medicare HMO | Attending: Internal Medicine | Admitting: Internal Medicine

## 2021-02-02 ENCOUNTER — Encounter (HOSPITAL_COMMUNITY): Payer: Self-pay | Admitting: *Deleted

## 2021-02-02 DIAGNOSIS — R296 Repeated falls: Secondary | ICD-10-CM | POA: Diagnosis present

## 2021-02-02 DIAGNOSIS — Z833 Family history of diabetes mellitus: Secondary | ICD-10-CM | POA: Diagnosis not present

## 2021-02-02 DIAGNOSIS — E119 Type 2 diabetes mellitus without complications: Secondary | ICD-10-CM | POA: Diagnosis present

## 2021-02-02 DIAGNOSIS — M25451 Effusion, right hip: Secondary | ICD-10-CM

## 2021-02-02 DIAGNOSIS — K828 Other specified diseases of gallbladder: Secondary | ICD-10-CM | POA: Diagnosis present

## 2021-02-02 DIAGNOSIS — S7001XA Contusion of right hip, initial encounter: Secondary | ICD-10-CM | POA: Diagnosis not present

## 2021-02-02 DIAGNOSIS — F32A Depression, unspecified: Secondary | ICD-10-CM | POA: Diagnosis present

## 2021-02-02 DIAGNOSIS — N390 Urinary tract infection, site not specified: Secondary | ICD-10-CM | POA: Diagnosis present

## 2021-02-02 DIAGNOSIS — I10 Essential (primary) hypertension: Secondary | ICD-10-CM | POA: Diagnosis present

## 2021-02-02 DIAGNOSIS — E876 Hypokalemia: Secondary | ICD-10-CM | POA: Diagnosis present

## 2021-02-02 DIAGNOSIS — R509 Fever, unspecified: Secondary | ICD-10-CM

## 2021-02-02 DIAGNOSIS — E860 Dehydration: Secondary | ICD-10-CM | POA: Diagnosis present

## 2021-02-02 DIAGNOSIS — R109 Unspecified abdominal pain: Secondary | ICD-10-CM | POA: Diagnosis not present

## 2021-02-02 DIAGNOSIS — Z7983 Long term (current) use of bisphosphonates: Secondary | ICD-10-CM

## 2021-02-02 DIAGNOSIS — Z7982 Long term (current) use of aspirin: Secondary | ICD-10-CM | POA: Diagnosis not present

## 2021-02-02 DIAGNOSIS — Z888 Allergy status to other drugs, medicaments and biological substances status: Secondary | ICD-10-CM | POA: Diagnosis not present

## 2021-02-02 DIAGNOSIS — R451 Restlessness and agitation: Secondary | ICD-10-CM | POA: Diagnosis not present

## 2021-02-02 DIAGNOSIS — I69354 Hemiplegia and hemiparesis following cerebral infarction affecting left non-dominant side: Secondary | ICD-10-CM

## 2021-02-02 DIAGNOSIS — Z885 Allergy status to narcotic agent status: Secondary | ICD-10-CM | POA: Diagnosis not present

## 2021-02-02 DIAGNOSIS — E1142 Type 2 diabetes mellitus with diabetic polyneuropathy: Secondary | ICD-10-CM | POA: Diagnosis present

## 2021-02-02 DIAGNOSIS — E1169 Type 2 diabetes mellitus with other specified complication: Secondary | ICD-10-CM | POA: Diagnosis not present

## 2021-02-02 DIAGNOSIS — Z8542 Personal history of malignant neoplasm of other parts of uterus: Secondary | ICD-10-CM

## 2021-02-02 DIAGNOSIS — Z751 Person awaiting admission to adequate facility elsewhere: Secondary | ICD-10-CM

## 2021-02-02 DIAGNOSIS — Z79899 Other long term (current) drug therapy: Secondary | ICD-10-CM

## 2021-02-02 DIAGNOSIS — N3000 Acute cystitis without hematuria: Secondary | ICD-10-CM | POA: Diagnosis not present

## 2021-02-02 DIAGNOSIS — Z9049 Acquired absence of other specified parts of digestive tract: Secondary | ICD-10-CM

## 2021-02-02 DIAGNOSIS — Z8249 Family history of ischemic heart disease and other diseases of the circulatory system: Secondary | ICD-10-CM | POA: Diagnosis not present

## 2021-02-02 DIAGNOSIS — N136 Pyonephrosis: Secondary | ICD-10-CM | POA: Diagnosis present

## 2021-02-02 DIAGNOSIS — Z20822 Contact with and (suspected) exposure to covid-19: Secondary | ICD-10-CM | POA: Diagnosis present

## 2021-02-02 DIAGNOSIS — W19XXXA Unspecified fall, initial encounter: Secondary | ICD-10-CM | POA: Diagnosis not present

## 2021-02-02 DIAGNOSIS — E785 Hyperlipidemia, unspecified: Secondary | ICD-10-CM | POA: Diagnosis present

## 2021-02-02 DIAGNOSIS — Z7902 Long term (current) use of antithrombotics/antiplatelets: Secondary | ICD-10-CM

## 2021-02-02 DIAGNOSIS — R338 Other retention of urine: Secondary | ICD-10-CM

## 2021-02-02 DIAGNOSIS — Z7984 Long term (current) use of oral hypoglycemic drugs: Secondary | ICD-10-CM

## 2021-02-02 LAB — CBC WITH DIFFERENTIAL/PLATELET
Abs Immature Granulocytes: 0.02 10*3/uL (ref 0.00–0.07)
Basophils Absolute: 0 10*3/uL (ref 0.0–0.1)
Basophils Relative: 1 %
Eosinophils Absolute: 0 10*3/uL (ref 0.0–0.5)
Eosinophils Relative: 0 %
HCT: 40.1 % (ref 36.0–46.0)
Hemoglobin: 13.4 g/dL (ref 12.0–15.0)
Immature Granulocytes: 0 %
Lymphocytes Relative: 13 %
Lymphs Abs: 0.7 10*3/uL (ref 0.7–4.0)
MCH: 28.2 pg (ref 26.0–34.0)
MCHC: 33.4 g/dL (ref 30.0–36.0)
MCV: 84.4 fL (ref 80.0–100.0)
Monocytes Absolute: 0.5 10*3/uL (ref 0.1–1.0)
Monocytes Relative: 10 %
Neutro Abs: 3.8 10*3/uL (ref 1.7–7.7)
Neutrophils Relative %: 76 %
Platelets: 281 10*3/uL (ref 150–400)
RBC: 4.75 MIL/uL (ref 3.87–5.11)
RDW: 12.9 % (ref 11.5–15.5)
WBC: 5 10*3/uL (ref 4.0–10.5)
nRBC: 0 % (ref 0.0–0.2)

## 2021-02-02 LAB — URINALYSIS, ROUTINE W REFLEX MICROSCOPIC
Bilirubin Urine: NEGATIVE
Glucose, UA: >=500 mg/dL — AB
Ketones, ur: 15 mg/dL — AB
Nitrite: POSITIVE — AB
Protein, ur: 30 mg/dL — AB
Specific Gravity, Urine: 1.01 (ref 1.005–1.030)
pH: 6 (ref 5.0–8.0)

## 2021-02-02 LAB — COMPREHENSIVE METABOLIC PANEL
ALT: 11 U/L (ref 0–44)
AST: 9 U/L — ABNORMAL LOW (ref 15–41)
Albumin: 3.3 g/dL — ABNORMAL LOW (ref 3.5–5.0)
Alkaline Phosphatase: 79 U/L (ref 38–126)
Anion gap: 10 (ref 5–15)
BUN: 17 mg/dL (ref 8–23)
CO2: 23 mmol/L (ref 22–32)
Calcium: 8.6 mg/dL — ABNORMAL LOW (ref 8.9–10.3)
Chloride: 99 mmol/L (ref 98–111)
Creatinine, Ser: 0.84 mg/dL (ref 0.44–1.00)
GFR, Estimated: >60 mL/min (ref >60)
Glucose, Bld: 318 mg/dL — ABNORMAL HIGH (ref 70–99)
Potassium: 2.9 mmol/L — ABNORMAL LOW (ref 3.5–5.1)
Sodium: 132 mmol/L — ABNORMAL LOW (ref 135–145)
Total Bilirubin: 0.5 mg/dL (ref 0.3–1.2)
Total Protein: 6.9 g/dL (ref 6.5–8.1)

## 2021-02-02 LAB — TSH: TSH: 0.764 u[IU]/mL (ref 0.350–4.500)

## 2021-02-02 LAB — MAGNESIUM: Magnesium: 1.8 mg/dL (ref 1.7–2.4)

## 2021-02-02 LAB — CREATININE, SERUM
Creatinine, Ser: 0.74 mg/dL (ref 0.44–1.00)
GFR, Estimated: >60 mL/min (ref >60)

## 2021-02-02 LAB — CBC
HCT: 38.4 % (ref 36.0–46.0)
Hemoglobin: 12.8 g/dL (ref 12.0–15.0)
MCH: 28.4 pg (ref 26.0–34.0)
MCHC: 33.3 g/dL (ref 30.0–36.0)
MCV: 85.1 fL (ref 80.0–100.0)
Platelets: 280 10*3/uL (ref 150–400)
RBC: 4.51 MIL/uL (ref 3.87–5.11)
RDW: 13.1 % (ref 11.5–15.5)
WBC: 4.6 10*3/uL (ref 4.0–10.5)
nRBC: 0 % (ref 0.0–0.2)

## 2021-02-02 LAB — URINALYSIS, MICROSCOPIC (REFLEX): WBC, UA: >50 WBC/hpf (ref 0–5)

## 2021-02-02 LAB — LIPASE, BLOOD: Lipase: 23 U/L (ref 11–51)

## 2021-02-02 LAB — GLUCOSE, CAPILLARY
Glucose-Capillary: 210 mg/dL — ABNORMAL HIGH (ref 70–99)
Glucose-Capillary: 125 mg/dL — ABNORMAL HIGH (ref 70–99)
Glucose-Capillary: 235 mg/dL — ABNORMAL HIGH (ref 70–99)

## 2021-02-02 LAB — CBG MONITORING, ED
Glucose-Capillary: 294 mg/dL — ABNORMAL HIGH (ref 70–99)
Glucose-Capillary: 295 mg/dL — ABNORMAL HIGH (ref 70–99)

## 2021-02-02 LAB — RESP PANEL BY RT-PCR (FLU A&B, COVID) ARPGX2
Influenza A by PCR: NEGATIVE
Influenza B by PCR: NEGATIVE
SARS Coronavirus 2 by RT PCR: NEGATIVE

## 2021-02-02 LAB — AMMONIA: Ammonia: 14 umol/L (ref 9–35)

## 2021-02-02 LAB — LACTIC ACID, PLASMA: Lactic Acid, Venous: 1.1 mmol/L (ref 0.5–1.9)

## 2021-02-02 LAB — PHOSPHORUS: Phosphorus: 3.4 mg/dL (ref 2.5–4.6)

## 2021-02-02 LAB — TROPONIN I (HIGH SENSITIVITY): Troponin I (High Sensitivity): 4 ng/L (ref <18)

## 2021-02-02 IMAGING — CT CT ABD-PELV W/ CM
2 of 5 series · 14 of 46 positions shown, 16 images · IV contrast (OMNIPAQUE 350)
Comparison: NIKOLA DINDI CT abdomen/pelvis exam from
[DATE].

CLINICAL DATA: Abdominal pain prior appendectomy.

EXAM:
CT ABDOMEN AND PELVIS WITH CONTRAST
TECHNIQUE: Multidetector CT imaging of the abdomen and pelvis was performed
using the standard protocol following bolus administration of
intravenous contrast.
CONTRAST:  80mL OMNIPAQUE IOHEXOL 350 MG/ML SOLN

[Series 2: axial st · axial · 0.68mm/px · z∈[-883,-493]mm · 11 of 91 slices shown, 13 images]
[im 7/91  soft-tissue]
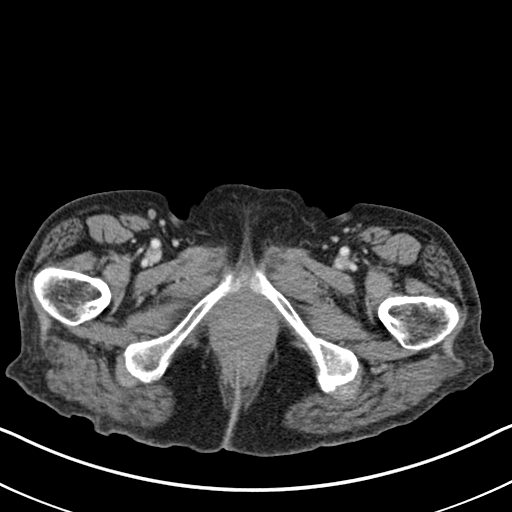
[im 7/91  bone]
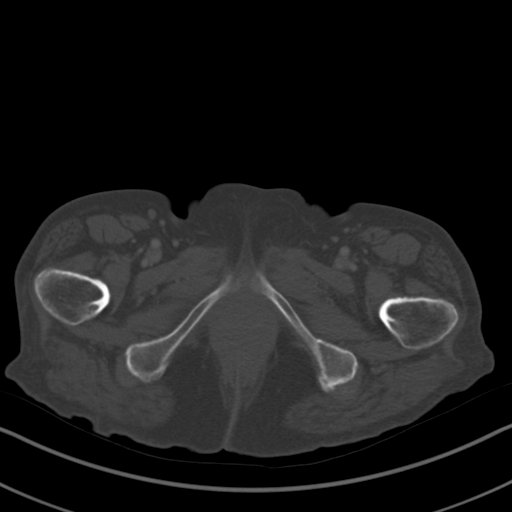
[im 13/91  soft-tissue]
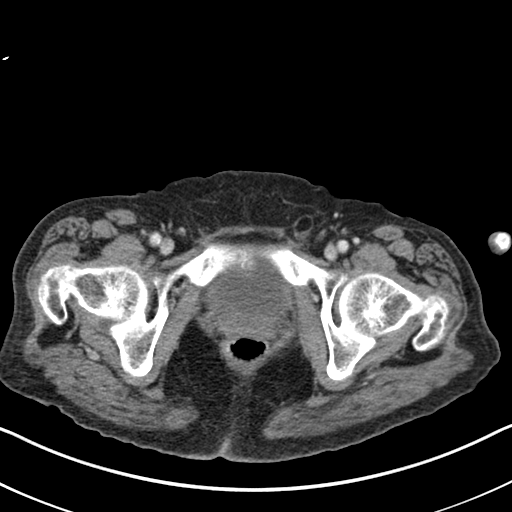
[im 25/91  soft-tissue]
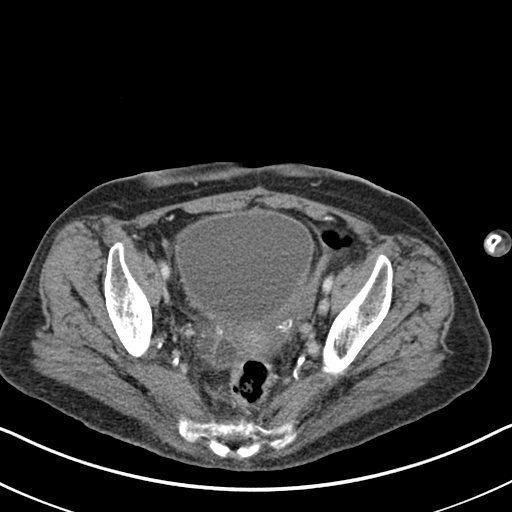
[im 31/91  soft-tissue]
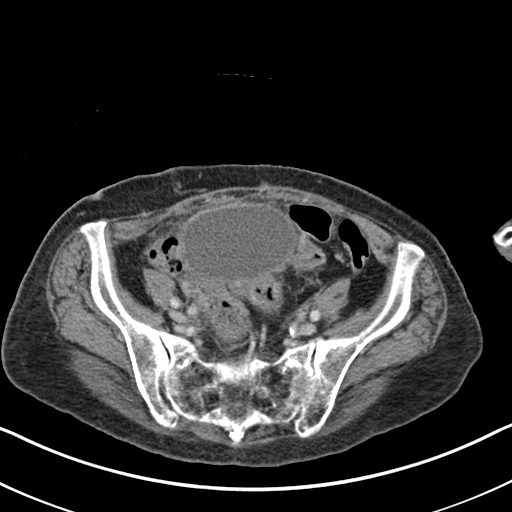
[im 37/91  soft-tissue]
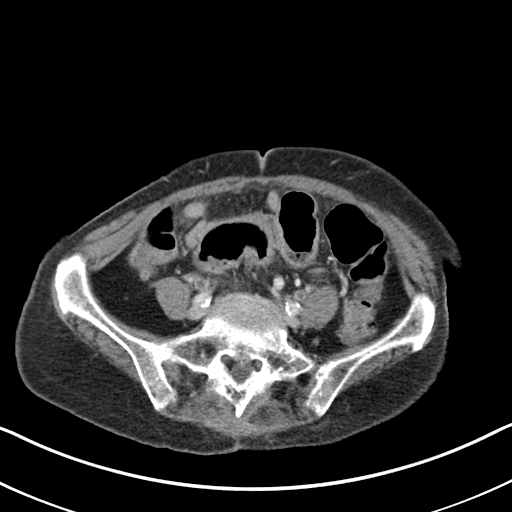
[im 49/91  soft-tissue]
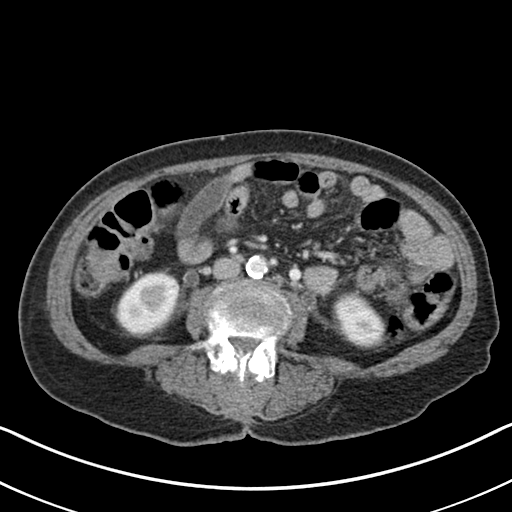
[im 55/91  soft-tissue]
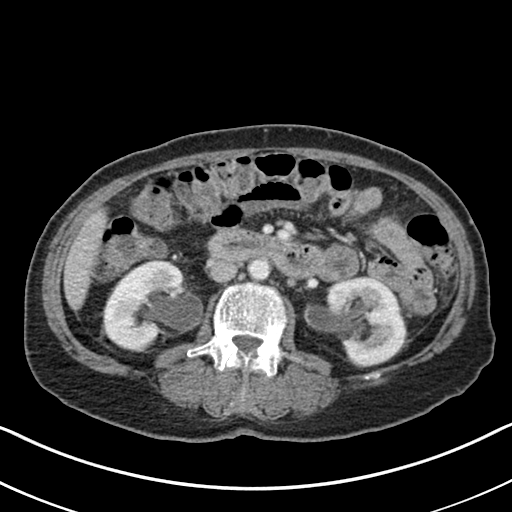
[im 61/91  soft-tissue]
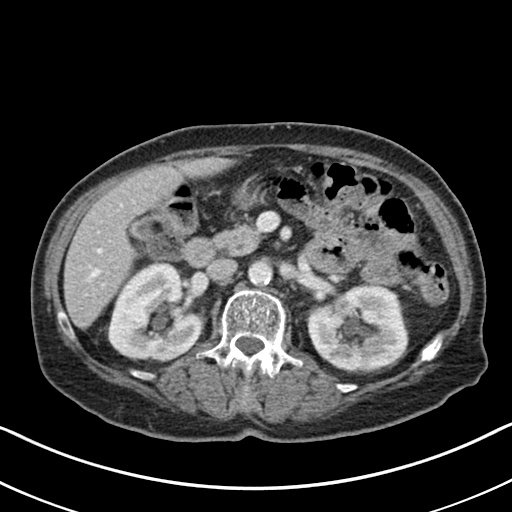
[im 67/91  soft-tissue]
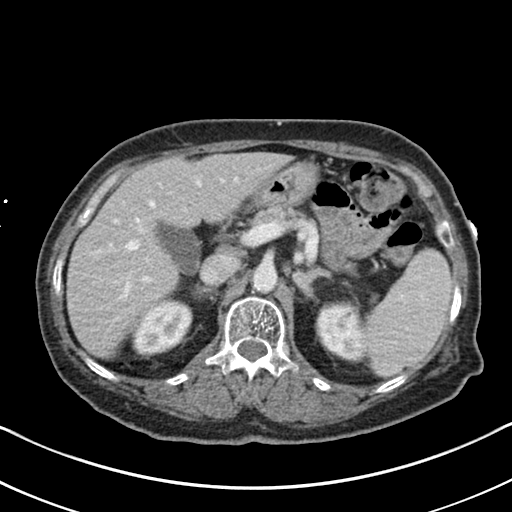
[im 67/91  bone]
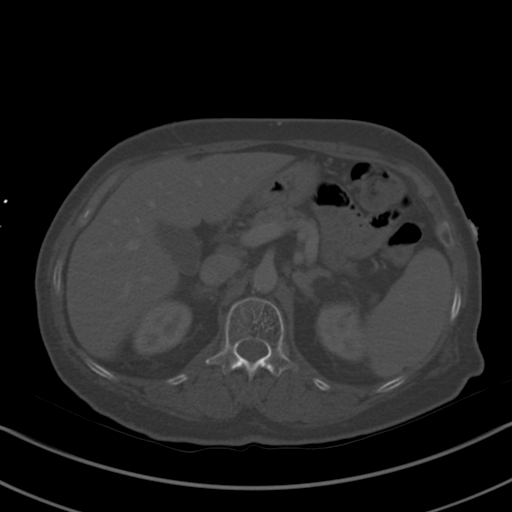
[im 79/91  soft-tissue]
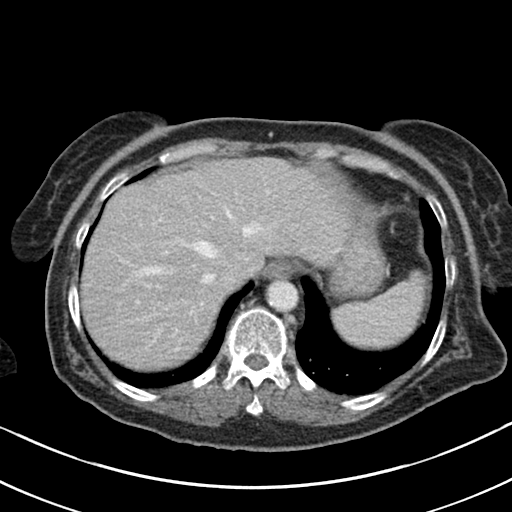
[im 85/91  soft-tissue]
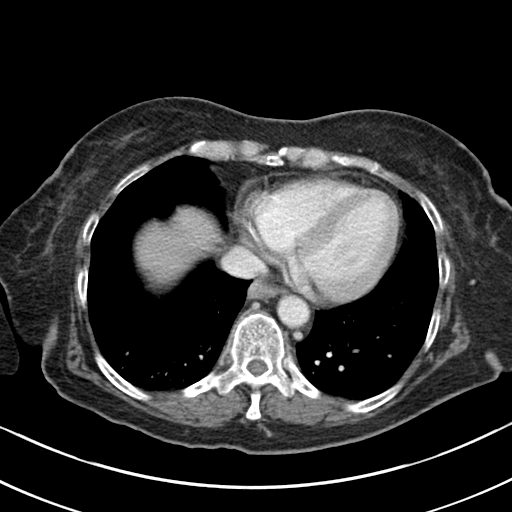

[Series 4: coronal st · coronal · 0.63mm/px · 3 of 121 slices shown]
[im 41/121  soft-tissue]
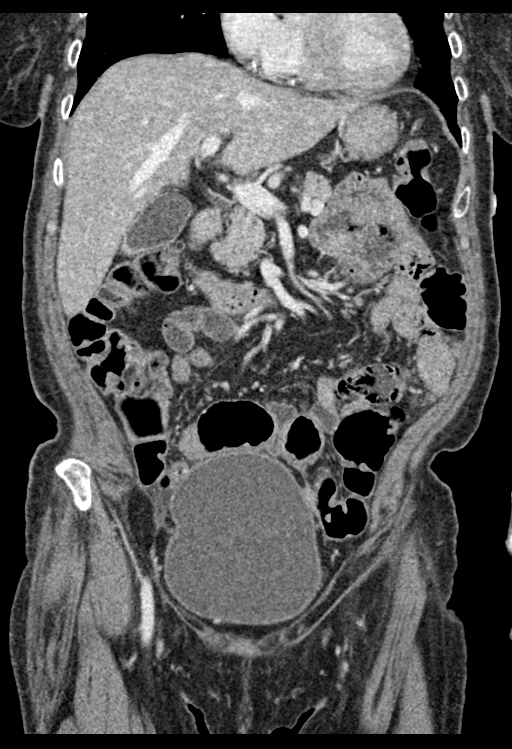
[im 54/121  soft-tissue]
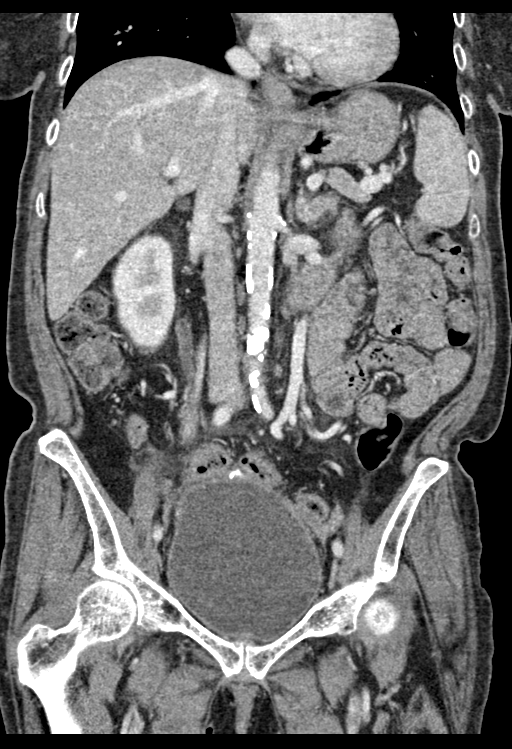
[im 67/121  soft-tissue]
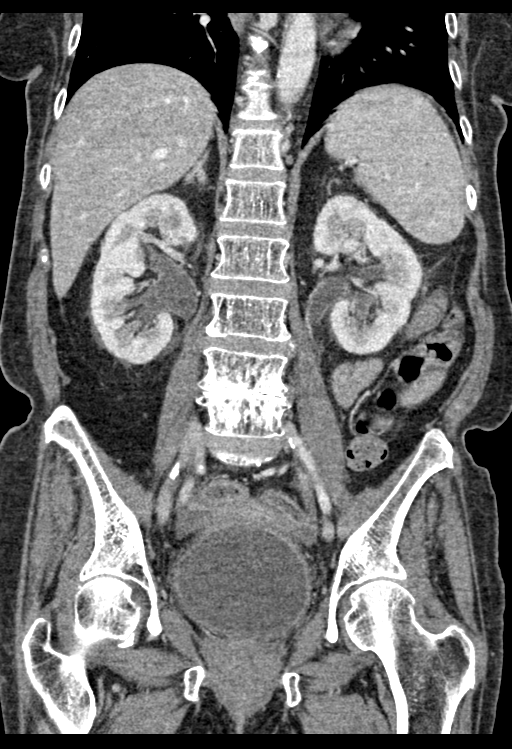

[14 of 46 positions shown; findings below may reference images not displayed]

FINDINGS: Lower chest: 7 mm subpleural right middle lobe nodule on image
4/series 3 is unchanged since abdomen CT [DATE] consistent with
benign etiology.

Hepatobiliary: No suspicious focal abnormality within the liver
parenchyma. There is no evidence for gallstones, gallbladder wall
thickening, or pericholecystic fluid. No intrahepatic or
extrahepatic biliary dilation.

Pancreas: No focal mass lesion. No dilatation of the main duct. No
intraparenchymal cyst. No peripancreatic edema.

Spleen: No splenomegaly. No focal mass lesion.

Adrenals/Urinary Tract: Left adrenal gland unremarkable. 11 mm right
adrenal nodule stable since [DATE] consistent with benign
etiology such as adenoma. Mild bilateral hydroureteronephrosis
noted, new since [DATE]. Mucosal hyperenhancement noted in the
renal pelvis and ureter bilaterally. Bladder is distended. No
suspicious mass lesion or stone disease in either kidney or ureter.

Stomach/Bowel: Stomach is unremarkable. No gastric wall thickening.
No evidence of outlet obstruction. Duodenum is normally positioned
as is the ligament of Treitz. No small bowel wall thickening. No
small bowel dilatation. The terminal ileum is normal.
Nonvisualization of the appendix is consistent with the reported
history of appendectomy. No gross colonic mass. No colonic wall
thickening. Perirectal and presacral edema is similar to prior.

Vascular/Lymphatic: There is advanced atherosclerotic calcification
of the abdominal aorta without aneurysm. There is no gastrohepatic
or hepatoduodenal ligament lymphadenopathy. No retroperitoneal or
mesenteric lymphadenopathy. No pelvic sidewall lymphadenopathy.

Reproductive: Fiducial markers are noted in the region of the cervix
multilocular structure in the posterior right adnexal space into the
cul-de-sac has been mildly progressive since [DATE]. This has a
somewhat tubular configuration and could represent a dilated
fallopian tube. Ill-defined soft tissue in the region of the cervix,
cul-de-sac, and posterior bladder is presumably treatment related.
Recurrent disease not excluded.

Other: No substantial intraperitoneal free fluid.

Musculoskeletal: No worrisome lytic or sclerotic osseous
abnormality. Degenerative disc disease noted L4-5 with diffuse
osteopenia.
IMPRESSION: 1. Interval development of mild bilateral hydroureteronephrosis with
mucosal hyperenhancement in the renal pelvis and ureter bilaterally.
No evidence for obstructing stone disease. Imaging features may be
related to the distended urinary bladder although the mucosal
hyperenhancement would not be expected in that setting. Urinary
tract infection would be a consideration. Obstruction due to
recurrent disease in this patient with a history of
endometrial/cervical cancer is not excluded.
2. Interval progression since [XO] of the multilocular structure in
the posterior right adnexal space extending into the cul-de-sac.
This has a somewhat tubular configuration and could represent a
dilated Fallopian tube. Pelvic ultrasound may prove helpful to
further evaluate.
3. Ill-defined soft tissue in the region of the cervix, cul-de-sac,
and posterior bladder is presumably treatment related. Recurrent
disease not excluded.
4. Stable 11 mm right adrenal nodule since [DATE] consistent
with benign etiology such as adenoma.
5. Stable 11 mm subpleural right middle lobe nodule since [XO]
consistent with benign etiology.
6. Aortic Atherosclerosis ([XO]-[XO]).

## 2021-02-02 IMAGING — DX DG ABDOMEN ACUTE W/ 1V CHEST
3 series · 3 of 3 positions shown · non-contrast
Comparison: None.

CLINICAL DATA: Altered mental status with multiple falls and
abdominal pain.

EXAM:
DG ABDOMEN ACUTE WITH 1 VIEW CHEST

[chest pa]
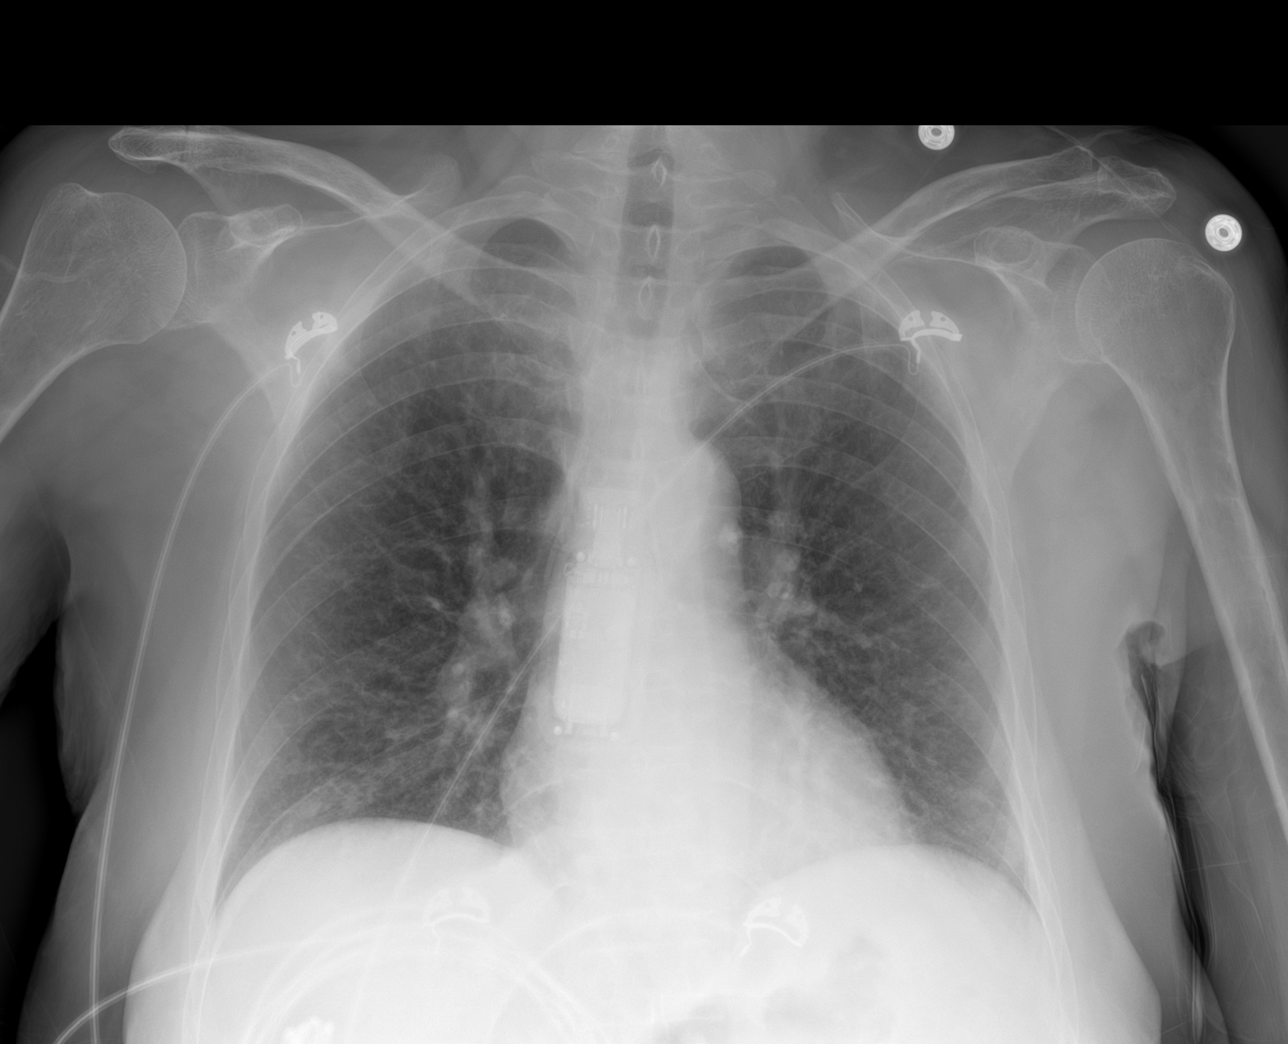

[abdomen erect ap]
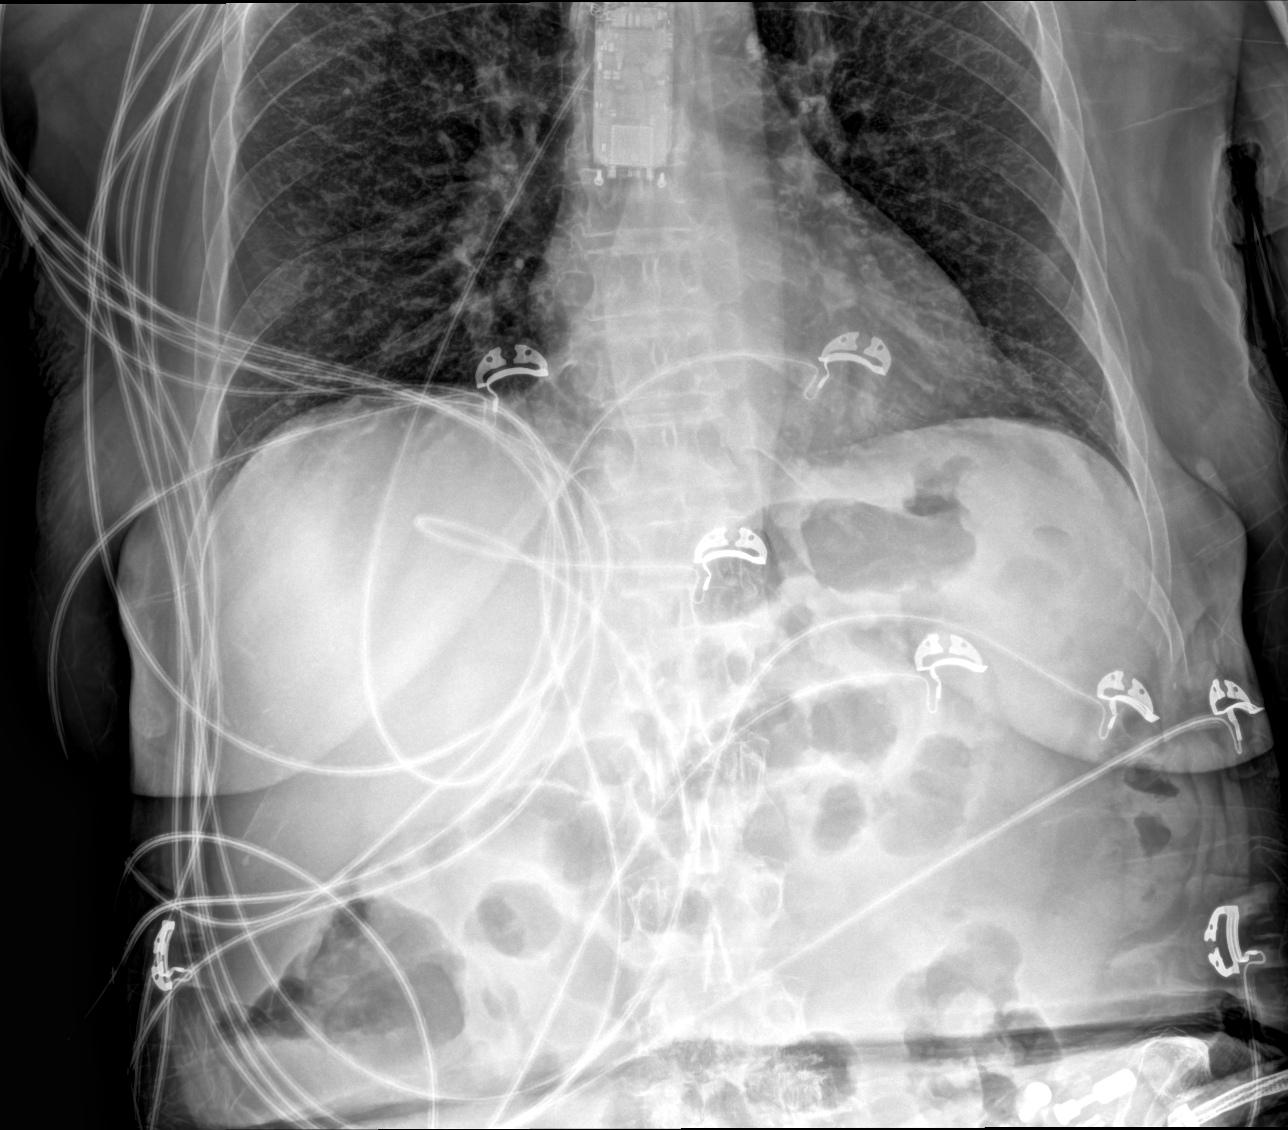

[abdomen supine]
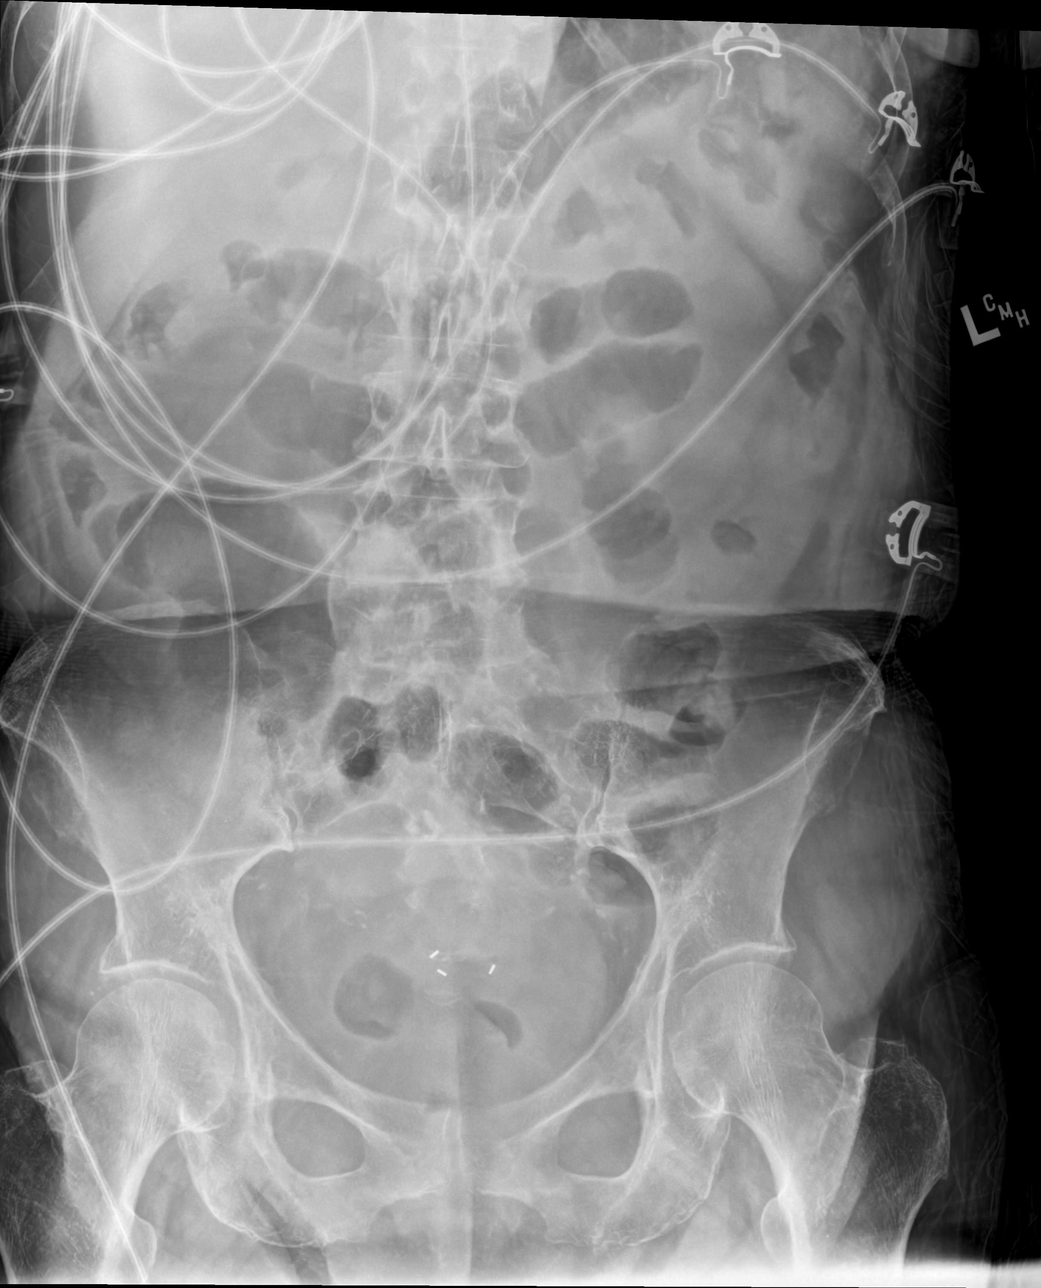

[3 of 3 positions shown; findings below may reference images not displayed]

FINDINGS: There is no evidence of dilated bowel loops or free intraperitoneal
air. No radiopaque calculi or other significant radiographic
abnormality is seen. Small surgical clips are seen overlying the
midline of the mid to lower pelvis. Heart size and mediastinal
contours are within normal limits. Both lungs are clear.
IMPRESSION: Negative abdominal radiographs.  No acute cardiopulmonary disease.

## 2021-02-02 MED ORDER — INSULIN GLARGINE-YFGN 100 UNIT/ML ~~LOC~~ SOLN
10.0000 [IU] | Freq: Two times a day (BID) | SUBCUTANEOUS | Status: DC
  Administered 2021-02-02 (×2): 10 [IU] via SUBCUTANEOUS
  Filled 2021-02-02 (×10): qty 0.1

## 2021-02-02 MED ORDER — SODIUM CHLORIDE 0.9 % IV SOLN
1.0000 g | Freq: Once | INTRAVENOUS | Status: AC
  Administered 2021-02-02: 1 g via INTRAVENOUS
  Filled 2021-02-02: qty 10

## 2021-02-02 MED ORDER — ONDANSETRON HCL 4 MG PO TABS: 4.0000 mg | ORAL_TABLET | Freq: Four times a day (QID) | ORAL | Status: DC | PRN

## 2021-02-02 MED ORDER — IOHEXOL 350 MG/ML SOLN
80.0000 mL | Freq: Once | INTRAVENOUS | Status: AC | PRN
  Administered 2021-02-02: 80 mL via INTRAVENOUS

## 2021-02-02 MED ORDER — SODIUM CHLORIDE 0.9 % IV SOLN: INTRAVENOUS | Status: DC

## 2021-02-02 MED ORDER — LOSARTAN POTASSIUM 50 MG PO TABS
50.0000 mg | ORAL_TABLET | Freq: Two times a day (BID) | ORAL | Status: DC
  Administered 2021-02-02 – 2021-02-07 (×11): 50 mg via ORAL
  Filled 2021-02-02 (×11): qty 1

## 2021-02-02 MED ORDER — NICOTINE 14 MG/24HR TD PT24
14.0000 mg | MEDICATED_PATCH | Freq: Every day | TRANSDERMAL | Status: DC
  Administered 2021-02-02 – 2021-02-07 (×6): 14 mg via TRANSDERMAL
  Filled 2021-02-02 (×6): qty 1

## 2021-02-02 MED ORDER — FENTANYL CITRATE PF 50 MCG/ML IJ SOSY: 100.0000 ug | PREFILLED_SYRINGE | INTRAMUSCULAR | Status: DC | PRN

## 2021-02-02 MED ORDER — ACETAMINOPHEN 325 MG PO TABS: 650.0000 mg | ORAL_TABLET | Freq: Four times a day (QID) | ORAL | Status: DC | PRN

## 2021-02-02 MED ORDER — ATORVASTATIN CALCIUM 40 MG PO TABS
80.0000 mg | ORAL_TABLET | Freq: Every day | ORAL | Status: DC
  Administered 2021-02-02 – 2021-02-06 (×5): 80 mg via ORAL
  Filled 2021-02-02 (×5): qty 2

## 2021-02-02 MED ORDER — SODIUM CHLORIDE 0.9 % IV BOLUS
1000.0000 mL | Freq: Once | INTRAVENOUS | Status: AC
  Administered 2021-02-02: 1000 mL via INTRAVENOUS

## 2021-02-02 MED ORDER — INSULIN ASPART 100 UNIT/ML IJ SOLN
0.0000 [IU] | Freq: Three times a day (TID) | INTRAMUSCULAR | Status: DC
  Administered 2021-02-02: 5 [IU] via SUBCUTANEOUS
  Administered 2021-02-02: 8 [IU] via SUBCUTANEOUS
  Administered 2021-02-02: 2 [IU] via SUBCUTANEOUS
  Administered 2021-02-03: 3 [IU] via SUBCUTANEOUS
  Administered 2021-02-03: 2 [IU] via SUBCUTANEOUS
  Administered 2021-02-03: 15 [IU] via SUBCUTANEOUS
  Administered 2021-02-04: 2 [IU] via SUBCUTANEOUS
  Administered 2021-02-04: 11 [IU] via SUBCUTANEOUS
  Administered 2021-02-04 – 2021-02-05 (×2): 5 [IU] via SUBCUTANEOUS
  Administered 2021-02-05: 09:00:00 2 [IU] via SUBCUTANEOUS
  Administered 2021-02-05: 17:00:00 5 [IU] via SUBCUTANEOUS
  Filled 2021-02-02: qty 1

## 2021-02-02 MED ORDER — ROPINIROLE HCL 0.25 MG PO TABS
0.2500 mg | ORAL_TABLET | Freq: Every day | ORAL | Status: DC
  Administered 2021-02-02 – 2021-02-04 (×3): 0.25 mg via ORAL
  Filled 2021-02-02 (×3): qty 1

## 2021-02-02 MED ORDER — CLOPIDOGREL BISULFATE 75 MG PO TABS
75.0000 mg | ORAL_TABLET | Freq: Every day | ORAL | Status: DC
  Administered 2021-02-02 – 2021-02-07 (×6): 75 mg via ORAL
  Filled 2021-02-02 (×6): qty 1

## 2021-02-02 MED ORDER — METOCLOPRAMIDE HCL 5 MG/ML IJ SOLN: 10.0000 mg | Freq: Three times a day (TID) | INTRAMUSCULAR | Status: DC | PRN

## 2021-02-02 MED ORDER — ONDANSETRON HCL 4 MG/2ML IJ SOLN: 4.0000 mg | Freq: Four times a day (QID) | INTRAMUSCULAR | Status: DC | PRN

## 2021-02-02 MED ORDER — CHLORHEXIDINE GLUCONATE CLOTH 2 % EX PADS
6.0000 | MEDICATED_PAD | Freq: Every day | CUTANEOUS | Status: DC
  Administered 2021-02-02 – 2021-02-07 (×6): 6 via TOPICAL

## 2021-02-02 MED ORDER — ENOXAPARIN SODIUM 40 MG/0.4ML IJ SOSY
40.0000 mg | PREFILLED_SYRINGE | INTRAMUSCULAR | Status: DC
  Administered 2021-02-02 – 2021-02-04 (×3): 40 mg via SUBCUTANEOUS
  Filled 2021-02-02 (×2): qty 0.4

## 2021-02-02 MED ORDER — AMLODIPINE BESYLATE 5 MG PO TABS
10.0000 mg | ORAL_TABLET | Freq: Every day | ORAL | Status: DC
  Administered 2021-02-02 – 2021-02-07 (×6): 10 mg via ORAL
  Filled 2021-02-02 (×6): qty 2

## 2021-02-02 MED ORDER — FENTANYL CITRATE PF 50 MCG/ML IJ SOSY
25.0000 ug | PREFILLED_SYRINGE | Freq: Once | INTRAMUSCULAR | Status: AC
  Administered 2021-02-02: 25 ug via INTRAVENOUS
  Filled 2021-02-02: qty 1

## 2021-02-02 MED ORDER — ACETAMINOPHEN 650 MG RE SUPP: 650.0000 mg | Freq: Four times a day (QID) | RECTAL | Status: DC | PRN

## 2021-02-02 MED ORDER — ASPIRIN EC 81 MG PO TBEC
81.0000 mg | DELAYED_RELEASE_TABLET | Freq: Every day | ORAL | Status: DC
  Administered 2021-02-02 – 2021-02-07 (×6): 81 mg via ORAL
  Filled 2021-02-02 (×6): qty 1

## 2021-02-02 MED ORDER — CEFTRIAXONE SODIUM 1 G IJ SOLR
1.0000 g | INTRAMUSCULAR | Status: DC
  Administered 2021-02-03 – 2021-02-05 (×3): 1 g via INTRAVENOUS
  Filled 2021-02-02 (×3): qty 10

## 2021-02-02 MED ORDER — ESCITALOPRAM OXALATE 10 MG PO TABS
20.0000 mg | ORAL_TABLET | Freq: Every day | ORAL | Status: DC
  Administered 2021-02-02 – 2021-02-07 (×6): 20 mg via ORAL
  Filled 2021-02-02 (×6): qty 2

## 2021-02-02 MED ORDER — ALBUTEROL SULFATE (2.5 MG/3ML) 0.083% IN NEBU: 3.0000 mL | INHALATION_SOLUTION | Freq: Four times a day (QID) | RESPIRATORY_TRACT | Status: DC | PRN

## 2021-02-02 MED ORDER — ONDANSETRON HCL 4 MG/2ML IJ SOLN
4.0000 mg | Freq: Once | INTRAMUSCULAR | Status: AC
  Administered 2021-02-02: 4 mg via INTRAVENOUS
  Filled 2021-02-02: qty 2

## 2021-02-02 MED ORDER — POTASSIUM CHLORIDE 10 MEQ/100ML IV SOLN
10.0000 meq | INTRAVENOUS | Status: AC
  Administered 2021-02-02 (×2): 10 meq via INTRAVENOUS
  Filled 2021-02-02 (×2): qty 100

## 2021-02-02 MED ORDER — POTASSIUM CHLORIDE IN NACL 40-0.9 MEQ/L-% IV SOLN: INTRAVENOUS | Status: DC

## 2021-02-02 NOTE — Evaluation (Signed)
Physical Therapy Evaluation Patient Details Name: Cheyenne [name] MRN: [id number] DOB: 05-Oct-1950 Today's Date: 02/02/2021  History of Present Illness  Patient is a 70 y/o female brought in by granddaughter for c/o pt having ams x one week and c/o abdominal pain; pt has had multiple falls; pt is oriented x4   Clinical Impression  Patient presents lethargic, able to answer some questions and participate with therapy with repeated verbal/tactile cueing.  Patient demonstrates slow labored movement for rolling to side and sitting up at bedside with Mod assist, very unsteady on feet and limited to a few side steps before having to sit due to poor standing balance and generalized weakness.  Patient tolerated sitting up in chair after therapy - nursing staff notified.  Patient will benefit from continued skilled physical therapy in hospital and recommended venue below to increase strength, balance, endurance for safe ADLs and gait.          Recommendations for follow up therapy are one component of a multi-disciplinary discharge planning process, led by the attending physician.  Recommendations may be updated based on patient status, additional functional criteria and insurance authorization.  Follow Up Recommendations Skilled nursing-short term rehab (<3 hours/day)    Assistance Recommended at Discharge Intermittent Supervision/Assistance  Functional Status Assessment Patient has had a recent decline in their functional status and demonstrates the ability to make significant improvements in function in a reasonable and predictable amount of time.  Equipment Recommendations  None recommended by PT    Recommendations for Other Services       Precautions / Restrictions Precautions Precautions: Fall Restrictions Weight Bearing Restrictions: No      Mobility  Bed Mobility Overal bed mobility: Needs Assistance Bed Mobility: Rolling;Sidelying to Sit Rolling: Mod assist Sidelying to sit: Mod  assist       General bed mobility comments: increased time, labored movement    Transfers Overall transfer level: Needs assistance Equipment used: Rolling walker (2 wheels) Transfers: Sit to/from Stand;Bed to chair/wheelchair/BSC Sit to Stand: Min assist;Mod assist   Step pivot transfers: Mod assist       General transfer comment: unsteady on feet, slow labored movement    Ambulation/Gait Ambulation/Gait assistance: Mod assist Gait Distance (Feet): 5 Feet Assistive device: Rolling walker (2 wheels) Gait Pattern/deviations: Decreased step length - right;Decreased step length - left;Decreased stride length Gait velocity: decreased     General Gait Details: limited to a few slow labored unsteady side steps due to fatigue and lethargy  Stairs            Wheelchair Mobility    Modified Rankin (Stroke Patients Only)       Balance Overall balance assessment: Needs assistance Sitting-balance support: Feet supported;No upper extremity supported Sitting balance-Leahy Scale: Fair Sitting balance - Comments: seated at EOB   Standing balance support: Reliant on assistive device for balance;Bilateral upper extremity supported;During functional activity Standing balance-Leahy Scale: Poor Standing balance comment: fair/poor using RW                             Pertinent Vitals/Pain Pain Assessment: Faces Faces Pain Scale: Hurts a little bit Pain Location: generalized pain with movement of extremities Pain Descriptors / Indicators: Sore;Grimacing Pain Intervention(s): Limited activity within patient's tolerance;Monitored during session;Repositioned    Home Living Family/patient expects to be discharged to:: Private residence Living Arrangements: Other relatives;Other (Comment) Available Help at Discharge: Family;Available 24 hours/day;Available PRN/intermittently;Other (Comment) Type of Home: House Home Access:  Stairs to enter Entrance Stairs-Rails:  Right Entrance Stairs-Number of Steps: 4   Home Layout: One level Home Equipment: Conservation officer, nature (2 wheels);Cane - single point;Shower seat Additional Comments: information from previous admission, patient is poor historian    Prior Function Prior Level of Function : Needs assist       Physical Assist : Mobility (physical)     Mobility Comments: Independent/Modified Independent household ambulator, does not drive due to poor vision, uses RW occasionally. ADLs Comments: Assisted by family and home nurse as needed.     Hand Dominance   Dominant Hand: Right    Extremity/Trunk Assessment   Upper Extremity Assessment Upper Extremity Assessment: Generalized weakness    Lower Extremity Assessment Lower Extremity Assessment: Generalized weakness    Cervical / Trunk Assessment Cervical / Trunk Assessment: Kyphotic  Communication   Communication: Other (comment) (Patient lethargic)  Cognition Arousal/Alertness: Lethargic Behavior During Therapy: WFL for tasks assessed/performed Overall Cognitive Status: Within Functional Limits for tasks assessed                                          General Comments      Exercises     Assessment/Plan    PT Assessment Patient needs continued PT services  PT Problem List Decreased strength;Decreased activity tolerance;Decreased balance;Decreased mobility       PT Treatment Interventions DME instruction;Stair training;Gait training;Functional mobility training;Therapeutic activities;Therapeutic exercise;Balance training;Patient/family education    PT Goals (Current goals can be found in the Care Plan section)  Acute Rehab PT Goals Patient Stated Goal: Return home. PT Goal Formulation: With patient Time For Goal Achievement: 02/16/21 Potential to Achieve Goals: Good    Frequency Min 3X/week   Barriers to discharge        Co-evaluation               AM-PAC PT "6 Clicks" Mobility  Outcome Measure  Help needed turning from your back to your side while in a flat bed without using bedrails?: A Little Help needed moving from lying on your back to sitting on the side of a flat bed without using bedrails?: A Lot Help needed moving to and from a bed to a chair (including a wheelchair)?: A Lot Help needed standing up from a chair using your arms (e.g., wheelchair or bedside chair)?: A Lot Help needed to walk in hospital room?: A Lot Help needed climbing 3-5 steps with a railing? : Total 6 Click Score: 12    End of Session   Activity Tolerance: Patient tolerated treatment well;Patient limited by fatigue Patient left: in chair;with call bell/phone within reach;with chair alarm set Nurse Communication: Mobility status PT Visit Diagnosis: Unsteadiness on feet (R26.81);Other abnormalities of gait and mobility (R26.89);Muscle weakness (generalized) (M62.81)    Time: 1751-0258 PT Time Calculation (min) (ACUTE ONLY): 24 min   Charges:   PT Evaluation $PT Eval Moderate Complexity: 1 Mod PT Treatments $Therapeutic Activity: 23-37 mins        12:30 PM, 02/02/21 Lonell Grandchild, MPT Physical Therapist with [facility] [phone] office [phone] mobile phone

## 2021-02-02 NOTE — ED Notes (Signed)
Report given to [facility] with [employer] 

## 2021-02-02 NOTE — ED Triage Notes (Signed)
Pt brought in by granddaughter for c/o pt having ams x one week and c/o abdominal pain; pt has had multiple falls; pt is oriented x4

## 2021-02-02 NOTE — Plan of Care (Signed)
  Problem: Acute Rehab PT Goals(only PT should resolve) Goal: Pt Will Go Supine/Side To Sit Outcome: Progressing Flowsheets (Taken 02/02/2021 1231) Pt will go Supine/Side to Sit: with min guard assist Goal: Patient Will Transfer Sit To/From Stand Outcome: Progressing Flowsheets (Taken 02/02/2021 1231) Patient will transfer sit to/from stand: with min guard assist Goal: Pt Will Transfer Bed To Chair/Chair To Bed Outcome: Progressing Flowsheets (Taken 02/02/2021 1231) Pt will Transfer Bed to Chair/Chair to Bed: min guard assist Goal: Pt Will Ambulate Outcome: Progressing Flowsheets (Taken 02/02/2021 1231) Pt will Ambulate:  50 feet  with min guard assist  with minimal assist  with rolling walker   12:31 PM, 02/02/21 Lonell Grandchild, MPT Physical Therapist with [facility] [phone] office [phone] mobile phone

## 2021-02-02 NOTE — ED Provider Notes (Signed)
[facility] EMERGENCY DEPARTMENT Provider Note   CSN: [id number] Arrival date & time: 02/02/21  0236     History Chief Complaint  Patient presents with   Abdominal Pain    Cheyenne [name] is a 70 y.o. female.  Level 5 caveat for altered mental status.  Patient brought in with multiple complaints by her granddaughter.  Granddaughter states she has not been right since she was sent home from the hospital last week after having multiple strokes.  Has had falls in a daily basis since then multiple times a day.  Has had increased confusion over the past 2 days as well as some abdominal pain and diarrhea.  Reports severe abdominal pain today with multiple loose stools.  Fever up to 101 at home.  No vomiting.  No pain with urination or blood in the urine.  No chest pain or shortness of breath.  Granddaughter feels she is not been right since she was discharged from the hospital after her strokes.  She does takes aspirin and Plavix.  Has had similar abdominal pain in the past due to colitis.  The history is provided by the patient and a relative. The history is limited by the condition of the patient.  Abdominal Pain Associated symptoms: diarrhea, fever and nausea   Associated symptoms: no chest pain, no dysuria, no hematuria, no shortness of breath and no vomiting       Past Medical History:  Diagnosis Date   Diabetes mellitus without complication ([place])    Endometrial cancer ([place])    details unavailable. 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[place]) 01/24/2021   Protein-calorie malnutrition, severe 11/18/2020   Enterocolitis 11/10/2020   Abdominal pain 11/10/2020   Nausea & vomiting 11/10/2020   AKI (acute kidney  injury) ([place]) 11/10/2020   Hyperglycemia due to diabetes mellitus ([place]) 11/10/2020   Leukocytosis 11/10/2020   Mixed hyperlipidemia 11/10/2020   Tobacco use 11/10/2020   Asthma 11/10/2020   SIRS (systemic inflammatory response syndrome) ([place]) 11/10/2020   Restless leg syndrome 11/10/2020   Vitamin D deficiency 11/10/2020   Enteritis 11/10/2020   Cerebrovascular accident (CVA) ([place])    Generalized weakness 10/25/2020   Essential hypertension 10/25/2020   Type 2 diabetes mellitus with hyperlipidemia ([place]) 10/25/2020   Psychiatric problem 10/25/2020    Past Surgical History:  Procedure Laterality Date   APPENDECTOMY     BACK SURGERY  2016   laminectomy and facetectomy     OB History   No obstetric history on file.     History reviewed. No pertinent family history.  Social History   Tobacco Use   Smoking status: Former    Packs/day: 0.50    Types: Cigarettes    Quit date: 10/26/2020    Years since quitting: 0.2   Smokeless tobacco: Never  Vaping Use   Vaping Use: Never used  Substance Use Topics   Alcohol use: Not Currently   Drug use: Never    Home Medications Prior to Admission medications   Medication Sig Start Date End Date Taking? Authorizing Provider  acetaminophen (TYLENOL) 325 MG tablet Take 650 mg by mouth every 6 (six) hours as needed for mild pain.  [provider]  albuterol (VENTOLIN HFA) 108 (90 Base) MCG/ACT inhaler Inhale 1-2 puffs into the lungs every 6 (six) hours as needed for wheezing or shortness of breath. 01/18/19   Gertie Baron, NP  alendronate (FOSAMAX) 70 MG tablet Take 70 mg by mouth once a week. Wednesday 10/03/20   [provider]  amLODipine (NORVASC) 10 MG tablet Take 1 tablet (10 mg total) by mouth daily. 10/31/20   Regalado, Jerald Kief A, MD  aspirin EC 81 MG EC tablet Take 1 tablet (81 mg total) by mouth daily. Swallow whole. 01/27/21   Roxan Hockey, MD  atorvastatin (LIPITOR) 40 MG tablet Take 80 mg by mouth  daily. 11/26/17   [provider]  cholecalciferol (VITAMIN D3) 25 MCG (1000 UNIT) tablet Take 1,000 Units by mouth daily.    [provider]  clopidogrel (PLAVIX) 75 MG tablet Take 1 tablet (75 mg total) by mouth daily. 01/27/21   Roxan Hockey, MD  escitalopram (LEXAPRO) 20 MG tablet Take 20 mg by mouth daily. 01/03/21   [provider]  glimepiride (AMARYL) 1 MG tablet Take 1 mg by mouth daily with breakfast. 11/05/17   [provider]  insulin NPH Human (NOVOLIN N) 100 UNIT/ML injection Inject 0.13 mLs (13 Units total) into the skin 2 (two) times daily at 8 am and 10 pm. Patient taking differently: Inject 20-40 Units into the skin 2 (two) times daily at 8 am and 10 pm. Inject 40 in the morning and 20 units every evening 11/21/20   Johnson, Clanford L, MD  loperamide (IMODIUM) 2 MG capsule Take 1 capsule (2 mg total) by mouth 3 (three) times daily as needed for diarrhea or loose stools. 11/21/20   Johnson, Clanford L, MD  loratadine (CLARITIN) 10 MG tablet Take 10 mg by mouth daily.    [provider]  losartan (COZAAR) 50 MG tablet Take 50 mg by mouth 2 (two) times daily. 12/08/20   [provider]  nicotine (NICODERM CQ - DOSED IN MG/24 HOURS) 14 mg/24hr patch Place 1 patch (14 mg total) onto the skin daily. 01/26/21 01/26/22  Roxan Hockey, MD  potassium chloride SA (KLOR-CON) 20 MEQ tablet Take 40 mEq by mouth every morning. 01/04/21   [provider]  rOPINIRole (REQUIP) 0.25 MG tablet Take 0.25 mg by mouth at bedtime. 10/06/20   [provider]    Allergies    Gabapentin, Pregabalin, and Codeine  Review of Systems   Review of Systems  Constitutional:  Positive for activity change, appetite change and fever.  HENT:  Negative for congestion.   Respiratory:  Negative for shortness of breath.   Cardiovascular:  Negative for chest pain.  Gastrointestinal:  Positive for abdominal pain, diarrhea and nausea. Negative for  vomiting.  Genitourinary:  Negative for dysuria and hematuria.  Musculoskeletal:  Positive for arthralgias and myalgias.  Skin:  Negative for wound.  Neurological:  Positive for weakness and light-headedness. Negative for facial asymmetry and headaches.   all other systems are negative except as noted in the HPI and PMH.   Physical Exam Updated Vital Signs BP (!) 182/84   Pulse (!) 101   Temp 98.3 F (36.8 C) (Oral)   Resp 12   Ht 5\' 4"  (1.626 m)   Wt 59.3 kg   SpO2 94%   BMI 22.44 kg/m   Physical Exam Vitals and nursing note reviewed.  Constitutional:      General: She is not in acute distress.    Appearance: She  is well-developed. She is not ill-appearing.     Comments: Oriented to person and place  HENT:     Head: Normocephalic and atraumatic.     Mouth/Throat:     Mouth: Mucous membranes are moist.     Pharynx: No oropharyngeal exudate.  Eyes:     Conjunctiva/sclera: Conjunctivae normal.     Pupils: Pupils are equal, round, and reactive to light.  Neck:     Comments: No meningismus. Cardiovascular:     Rate and Rhythm: Normal rate and regular rhythm.     Heart sounds: Normal heart sounds. No murmur heard. Pulmonary:     Effort: Pulmonary effort is normal. No respiratory distress.     Breath sounds: Normal breath sounds.  Abdominal:     Palpations: Abdomen is soft.     Tenderness: There is abdominal tenderness. There is guarding. There is no rebound.     Comments: Diffuse tenderness across lower quadrants.  Voluntary guarding  Musculoskeletal:        General: No tenderness. Normal range of motion.     Cervical back: Normal range of motion and neck supple.  Skin:    General: Skin is warm.     Findings: No rash.  Neurological:     Mental Status: She is alert and oriented to person, place, and time.     Cranial Nerves: No cranial nerve deficit.     Motor: No abnormal muscle tone.     Coordination: Coordination normal.     Comments: No facial droop.  Tongue is  midline.  Decreased grip strength on the left.  Weakness in left arm and left leg at baseline per granddaughter 5/5 strength on the right  Psychiatric:        Behavior: Behavior normal.    ED Results / Procedures / Treatments   Labs (all labs ordered are listed, but only abnormal results are displayed) Labs Reviewed  COMPREHENSIVE METABOLIC PANEL - Abnormal; Notable for the following components:      Result Value   Sodium 132 (*)    Potassium 2.9 (*)    Glucose, Bld 318 (*)    Calcium 8.6 (*)    Albumin 3.3 (*)    AST 9 (*)    All other components within normal limits  CBG MONITORING, ED - Abnormal; Notable for the following components:   Glucose-Capillary 294 (*)    All other components within normal limits  RESP PANEL BY RT-PCR (FLU A&B, COVID) ARPGX2  CBC WITH DIFFERENTIAL/PLATELET  LIPASE, BLOOD  AMMONIA  TSH  LACTIC ACID, PLASMA  URINALYSIS, ROUTINE W REFLEX MICROSCOPIC  TROPONIN I (HIGH SENSITIVITY)    EKG EKG Interpretation  Date/Time:  Thursday February 02 2021 03:01:04 EST Ventricular Rate:  101 PR Interval:  129 QRS Duration: 92 QT Interval:  364 QTC Calculation: 472 R Axis:   83 Text Interpretation: Sinus tachycardia Borderline right axis deviation Nonspecific T abnormalities, lateral leads Rate faster Confirmed by Ezequiel Essex [phone]) on 02/02/2021 3:45:17 AM  Radiology CT Head Wo Contrast  Result Date: 02/02/2021 CLINICAL DATA:  Neuro deficit, stroke suspected, recent CVA. Frequent falls. Altered mental status. Left arm weakness. EXAM: CT HEAD WITHOUT CONTRAST TECHNIQUE: Contiguous axial images were obtained from the base of the skull through the vertex without intravenous contrast. COMPARISON:  MRI brain and head CT both 01/24/2021, with MRI showing new punctate or subacute infarcts in left frontal lobe and old lacunar infarct in the right parietal lobe with surrounding diffusion restriction, CT showing a  7 mm hyperintensity laterally in the left  frontal lobe and potential tiny subarachnoid bleed in the right parietal lobe. FINDINGS: Brain: There was previously a 7 mm hyperdensity at the gray-white matter interface laterally in the upper left frontal lobe which is no longer seen as well as suspected trace subarachnoid bleed in the right parietal lobe just above this same level, also no longer seen. The scattered tiny left frontal cortical infarcts noted on MRI are not well recharacterized with CT. A 1 cm chronic appearing lacunar infarct in the right frontoparietal subcortical white matter is again noted as well as mild atrophy and moderate to severe small vessel disease in the cerebral white matter. Cerebellum and brainstem are unremarkable, as visualized. No new abnormality is seen by CT. No midline shift. Vascular: There are no hyperdense central vessels. Scattered calcific plaque in the carotid siphons is again noted. Skull: Normal. Negative for fracture or focal lesion. Sinuses/Orbits: No acute finding. Other: None. IMPRESSION: No acute intracranial CT findings. Small foci of left frontal and right parietal CT hyperdensity on the CT 9 days ago are not seen today. Right frontoparietal lacunar infarct is redemonstrated as well as moderate to severe small vessel changes. If there is concern for occult infarct, given prior findings on MRI, repeat MRI should be obtained. Electronically Signed   By: Telford Nab M.D.   On: 02/02/2021 06:02   CT ABDOMEN PELVIS W CONTRAST  Result Date: 02/02/2021 CLINICAL DATA:  Abdominal pain prior appendectomy. EXAM: CT ABDOMEN AND PELVIS WITH CONTRAST TECHNIQUE: Multidetector CT imaging of the abdomen and pelvis was performed using the standard protocol following bolus administration of intravenous contrast. CONTRAST:  8mL OMNIPAQUE IOHEXOL 350 MG/ML SOLN COMPARISON:  [facility] CT abdomen/pelvis exam from 12/23/2020. FINDINGS: Lower chest: 7 mm subpleural right middle lobe nodule on image 4/series 3 is  unchanged since abdomen CT 03/21/2014 consistent with benign etiology. Hepatobiliary: No suspicious focal abnormality within the liver parenchyma. There is no evidence for gallstones, gallbladder wall thickening, or pericholecystic fluid. No intrahepatic or extrahepatic biliary dilation. Pancreas: No focal mass lesion. No dilatation of the main duct. No intraparenchymal cyst. No peripancreatic edema. Spleen: No splenomegaly. No focal mass lesion. Adrenals/Urinary Tract: Left adrenal gland unremarkable. 11 mm right adrenal nodule stable since 11/27/2017 consistent with benign etiology such as adenoma. Mild bilateral hydroureteronephrosis noted, new since 12/23/2020. Mucosal hyperenhancement noted in the renal pelvis and ureter bilaterally. Bladder is distended. No suspicious mass lesion or stone disease in either kidney or ureter. Stomach/Bowel: Stomach is unremarkable. No gastric wall thickening. No evidence of outlet obstruction. Duodenum is normally positioned as is the ligament of Treitz. No small bowel wall thickening. No small bowel dilatation. The terminal ileum is normal. Nonvisualization of the appendix is consistent with the reported history of appendectomy. No gross colonic mass. No colonic wall thickening. Perirectal and presacral edema is similar to prior. Vascular/Lymphatic: There is advanced atherosclerotic calcification of the abdominal aorta without aneurysm. There is no gastrohepatic or hepatoduodenal ligament lymphadenopathy. No retroperitoneal or mesenteric lymphadenopathy. No pelvic sidewall lymphadenopathy. Reproductive: Fiducial markers are noted in the region of the cervix multilocular structure in the posterior right adnexal space into the cul-de-sac has been mildly progressive since 11/27/2017. This has a somewhat tubular configuration and could represent a dilated fallopian tube. Ill-defined soft tissue in the region of the cervix, cul-de-sac, and posterior bladder is presumably treatment  related. Recurrent disease not excluded. Other: No substantial intraperitoneal free fluid. Musculoskeletal: No worrisome lytic or sclerotic osseous abnormality.  Degenerative disc disease noted L4-5 with diffuse osteopenia. IMPRESSION: 1. Interval development of mild bilateral hydroureteronephrosis with mucosal hyperenhancement in the renal pelvis and ureter bilaterally. No evidence for obstructing stone disease. Imaging features may be related to the distended urinary bladder although the mucosal hyperenhancement would not be expected in that setting. Urinary tract infection would be a consideration. Obstruction due to recurrent disease in this patient with a history of endometrial/cervical cancer is not excluded. 2. Interval progression since 2019 of the multilocular structure in the posterior right adnexal space extending into the cul-de-sac. This has a somewhat tubular configuration and could represent a dilated Fallopian tube. Pelvic ultrasound may prove helpful to further evaluate. 3. Ill-defined soft tissue in the region of the cervix, cul-de-sac, and posterior bladder is presumably treatment related. Recurrent disease not excluded. 4. Stable 11 mm right adrenal nodule since 11/27/2017 consistent with benign etiology such as adenoma. 5. Stable 11 mm subpleural right middle lobe nodule since 2016 consistent with benign etiology. 6. Aortic Atherosclerosis (ICD10-I70.0). Electronically Signed   By: Misty Stanley M.D.   On: 02/02/2021 06:05   DG ABD ACUTE 2+V W 1V CHEST  Result Date: 02/02/2021 CLINICAL DATA:  Altered mental status with multiple falls and abdominal pain. EXAM: DG ABDOMEN ACUTE WITH 1 VIEW CHEST COMPARISON:  None. FINDINGS: There is no evidence of dilated bowel loops or free intraperitoneal air. No radiopaque calculi or other significant radiographic abnormality is seen. Small surgical clips are seen overlying the midline of the mid to lower pelvis. Heart size and mediastinal contours are  within normal limits. Both lungs are clear. IMPRESSION: Negative abdominal radiographs.  No acute cardiopulmonary disease. Electronically Signed   By: Virgina Norfolk M.D.   On: 02/02/2021 04:02    Procedures Procedures   Medications Ordered in ED Medications  sodium chloride 0.9 % bolus 1,000 mL (has no administration in time range)  ondansetron (ZOFRAN) injection 4 mg (has no administration in time range)  fentaNYL (SUBLIMAZE) injection 25 mcg (has no administration in time range)    ED Course  I have reviewed the triage vital signs and the nursing notes.  Pertinent labs & imaging results that were available during my care of the patient were reviewed by me and considered in my medical decision making (see chart for details).    MDM Rules/Calculators/A&P                          Altered mental status last seen normal several weeks ago.  Abdominal pain with diarrhea and fever.  Recurrent falls with recent stroke  Vital stable, no distress. Patient given IV fluids, labs and urinalysis will be obtained.  Labs remarkable for mild hypokalemia.  Daughter reports recurrent falls ever since she was discharged from the hospital after her stroke. Significant abdominal pain today with diarrhea and fever.  CT imaging not available at this facility.  Patient transferred to [facility] for imaging.  CT scan shows chronic areas of infarct in her brain CT abdomen pelvis shows diffuse areas of pelvic enhancement hydronephrosis without obstructing stone  Urinalysis still pending but there is significant clinical concern for UTI given CT appearance.  Started empirically on IV Rocephin.  Patient still with ongoing abdominal pain.  Has had recurrent falls at home since her strokes last week.  Generalized weakness with abdominal pain, diarrhea and fever. May need consideration of new stroke.   Feels she would benefit from admission.  Discussed with Dr. Josephine Cables.  Final Clinical Impression(s) / ED  Diagnoses Final diagnoses:  Fever    Rx / DC Orders ED Discharge Orders     None        Lolamae Voisin, Annie Main, MD 02/02/21 [id number]

## 2021-02-02 NOTE — H&P (Signed)
History and Physical    Cheyenne [name]:[id number] DOB: June 18, 1950 DOA: 02/02/2021  PCP: Toni Arthurs, PA   Patient coming from: home   I have personally briefly reviewed patient's old medical records in [facility]  Chief Complaint: abd pain, nausea/vomiting, diarrhea.  HPI: Cheyenne [name] is a 70 y.o. female with medical history significant of type 2 diabetes mellitus, hyperlipidemia, hypertension, prior history of endometrial cancer, peripheral neuropathy and recent hospitalization secondary to a stroke with residual left-sided deficits; who presented to the hospital secondary to abdominal pain, fever, nausea/vomiting and diarrhea.  Symptoms have been present for the last 3 days and worsening.  Patient's granddaughter reported that since she was discharged from the hospital after her stroke she has have a hard time mobilizing herself due to poor balances and experiencing multiple fall events at home.  The abdominal pain is localized mainly in her lower abdomen and bilateral flanks.  They have not been any reports of bleeding, hematuria, melena, hematochezia, hematemesis or complaints of chest pain, shortness of breath, new focal deficits or any other complaints.  (At baseline patient with left Residual weakness).  There is no immunization records against COVID; COVID PCR in the ED negative.  ED Course: CT abdomen demonstrating bilateral Hydro ureter and mild hydronephrosis without obstructive stone; there is a large bladder with inflammatory changes and concern for cystitis.  IV fluids has been provided and empiric Rocephin after collecting cultures initiated.  TRH has been consulted to place patient in the hospital for further evaluation and management.  Review of Systems: As per HPI otherwise all other systems reviewed and are negative.  Past Medical History:  Diagnosis Date   Diabetes mellitus without complication ([place])    Endometrial cancer ([place])    details unavailable.  brachytherapy seeds within region of cervix on CT imaging 2019   HTN (hypertension)    Peripheral neuropathy    Spinal stenosis    Stroke [facility])    left sided arm weakness    Past Surgical History:  Procedure Laterality Date   APPENDECTOMY     BACK SURGERY  2016   laminectomy and facetectomy    Social History  reports that she quit smoking about 3 months ago. Her smoking use included cigarettes. She smoked an average of .5 packs per day. She has never used smokeless tobacco. She reports that she does not currently use alcohol. She reports that she does not use drugs.  Allergies  Allergen Reactions   Clonazepam     Other reaction(s): Other (See Comments) irritability   Gabapentin     Bad dreams   Pregabalin     Drowsiness. Mind not clear.   Codeine Itching   Family history: Hypertension and diabetes.   Prior to Admission medications   Medication Sig Start Date End Date Taking? Authorizing Provider  acetaminophen (TYLENOL) 325 MG tablet Take 650 mg by mouth every 6 (six) hours as needed for mild pain.   Yes [provider]  albuterol (VENTOLIN HFA) 108 (90 Base) MCG/ACT inhaler Inhale 1-2 puffs into the lungs every 6 (six) hours as needed for wheezing or shortness of breath. 01/18/19  Yes Gertie Baron, NP  alendronate (FOSAMAX) 70 MG tablet Take 70 mg by mouth once a week. Wednesday 10/03/20  Yes [provider]  amLODipine (NORVASC) 10 MG tablet Take 1 tablet (10 mg total) by mouth daily. 10/31/20  Yes Regalado, Belkys A, MD  aspirin EC 81 MG EC tablet Take 1 tablet (81  mg total) by mouth daily. Swallow whole. 01/27/21  Yes Emokpae, Courage, MD  atorvastatin (LIPITOR) 40 MG tablet Take 80 mg by mouth daily. 11/26/17  Yes [provider]  cholecalciferol (VITAMIN D3) 25 MCG (1000 UNIT) tablet Take 1,000 Units by mouth daily.   Yes [provider]  clopidogrel (PLAVIX) 75 MG tablet Take 1 tablet (75 mg total) by mouth daily. 01/27/21  Yes  Emokpae, Courage, MD  escitalopram (LEXAPRO) 20 MG tablet Take 20 mg by mouth daily. 01/03/21  Yes [provider]  glimepiride (AMARYL) 1 MG tablet Take 1 mg by mouth daily with breakfast. 11/05/17  Yes [provider]  insulin NPH Human (NOVOLIN N) 100 UNIT/ML injection Inject 0.13 mLs (13 Units total) into the skin 2 (two) times daily at 8 am and 10 pm. Patient taking differently: Inject 20-40 Units into the skin 2 (two) times daily at 8 am and 10 pm. Inject 40 in the morning and 20 units every evening 11/21/20  Yes Johnson, Clanford L, MD  loperamide (IMODIUM) 2 MG capsule Take 1 capsule (2 mg total) by mouth 3 (three) times daily as needed for diarrhea or loose stools. 11/21/20  Yes Johnson, Clanford L, MD  loratadine (CLARITIN) 10 MG tablet Take 10 mg by mouth daily.   Yes [provider]  losartan (COZAAR) 50 MG tablet Take 50 mg by mouth 2 (two) times daily. 12/08/20  Yes [provider]  nicotine (NICODERM CQ - DOSED IN MG/24 HOURS) 14 mg/24hr patch Place 1 patch (14 mg total) onto the skin daily. 01/26/21 01/26/22 Yes Emokpae, Courage, MD  potassium chloride SA (KLOR-CON) 20 MEQ tablet Take 40 mEq by mouth every morning. 01/04/21  Yes [provider]  rOPINIRole (REQUIP) 0.25 MG tablet Take 0.25 mg by mouth at bedtime. 10/06/20  Yes [provider]    Physical Exam: Vitals:   02/02/21 0400 02/02/21 0435 02/02/21 0623 02/02/21 0630  BP: (!) 191/77 (!) 201/79 (!) 165/79 (!) 175/141  Pulse: 99 99 97 93  Resp: 19 (!) 22  19  Temp:   98 F (36.7 C)   TempSrc:   Oral   SpO2: 96% 94% 94% 96%  Weight:      Height:        Constitutional: In moderate distress secondary to abdominal pain; patient also expressing being nauseated and experience an episode of loose stools.  No chest pain, no shortness of breath. Vitals:   02/02/21 0400 02/02/21 0435 02/02/21 0623 02/02/21 0630  BP: (!) 191/77 (!) 201/79 (!) 165/79 (!) 175/141  Pulse: 99 99 97 93   Resp: 19 (!) 22  19  Temp:   98 F (36.7 C)   TempSrc:   Oral   SpO2: 96% 94% 94% 96%  Weight:      Height:       Eyes: PERRL, lids and conjunctivae normal, no icterus, no nystagmus. ENMT: Dry mucous membranes appreciated on examination. Posterior pharynx clear of any exudate or lesions. Neck: normal, supple, no masses, no thyromegaly, no JVD. Respiratory: clear to auscultation bilaterally, no wheezing, no crackles. Normal respiratory effort. No accessory muscle use.  Cardiovascular: Borderline sinus tachycardia appreciated on exam; no rubs, no gallops, no murmurs. Abdomen: Diffuse tenderness to palpation, no guarding, positive bowel sounds; tenderness mainly lower abdomen and flanks with deep palpation. Musculoskeletal: no clubbing / cyanosis. No joint deformity upper and lower extremities.  No contractures; normal muscle tone. Skin: no petechiae. Neurologic: CN 2-12 grossly intact.  Left-sided is  weaker than the right; residual deficit from previous stroke and no new.  Patient appears slightly deconditioned. Psychiatric: Normal judgment and insight. Alert and oriented x 3. Normal mood.   Labs on Admission: I have personally reviewed following labs and imaging studies  CBC: Recent Labs  Lab 02/02/21 0312  WBC 5.0  NEUTROABS 3.8  HGB 13.4  HCT 40.1  MCV 84.4  PLT 462    Basic Metabolic Panel: Recent Labs  Lab 02/02/21 0312  NA 132*  K 2.9*  CL 99  CO2 23  GLUCOSE 318*  BUN 17  CREATININE 0.84  CALCIUM 8.6*    GFR: Estimated Creatinine Clearance: 54.6 mL/min (by C-G formula based on SCr of 0.84 mg/dL).  Liver Function Tests: Recent Labs  Lab 02/02/21 0312  AST 9*  ALT 11  ALKPHOS 79  BILITOT 0.5  PROT 6.9  ALBUMIN 3.3*    Urine analysis:    Component Value Date/Time   COLORURINE YELLOW 01/24/2021 1930   APPEARANCEUR CLEAR 01/24/2021 1930   APPEARANCEUR Clear 03/20/2014 2149   LABSPEC 1.016 01/24/2021 1930   LABSPEC 1.005 03/20/2014 2149   PHURINE  6.0 01/24/2021 1930   GLUCOSEU >=500 (A) 01/24/2021 1930   GLUCOSEU Negative 03/20/2014 2149   HGBUR MODERATE (A) 01/24/2021 1930   [place] NEGATIVE 01/24/2021 1930   BILIRUBINUR Negative 03/20/2014 2149   [place] NEGATIVE 01/24/2021 1930   PROTEINUR 30 (A) 01/24/2021 1930   NITRITE NEGATIVE 01/24/2021 1930   LEUKOCYTESUR NEGATIVE 01/24/2021 1930   LEUKOCYTESUR Negative 03/20/2014 2149    Radiological Exams on Admission: CT Head Wo Contrast  Result Date: 02/02/2021 CLINICAL DATA:  Neuro deficit, stroke suspected, recent CVA. Frequent falls. Altered mental status. Left arm weakness. EXAM: CT HEAD WITHOUT CONTRAST TECHNIQUE: Contiguous axial images were obtained from the base of the skull through the vertex without intravenous contrast. COMPARISON:  MRI brain and head CT both 01/24/2021, with MRI showing new punctate or subacute infarcts in left frontal lobe and old lacunar infarct in the right parietal lobe with surrounding diffusion restriction, CT showing a 7 mm hyperintensity laterally in the left frontal lobe and potential tiny subarachnoid bleed in the right parietal lobe. FINDINGS: Brain: There was previously a 7 mm hyperdensity at the gray-white matter interface laterally in the upper left frontal lobe which is no longer seen as well as suspected trace subarachnoid bleed in the right parietal lobe just above this same level, also no longer seen. The scattered tiny left frontal cortical infarcts noted on MRI are not well recharacterized with CT. A 1 cm chronic appearing lacunar infarct in the right frontoparietal subcortical white matter is again noted as well as mild atrophy and moderate to severe small vessel disease in the cerebral white matter. Cerebellum and brainstem are unremarkable, as visualized. No new abnormality is seen by CT. No midline shift. Vascular: There are no hyperdense central vessels. Scattered calcific plaque in the carotid siphons is again noted. Skull: Normal.  Negative for fracture or focal lesion. Sinuses/Orbits: No acute finding. Other: None. IMPRESSION: No acute intracranial CT findings. Small foci of left frontal and right parietal CT hyperdensity on the CT 9 days ago are not seen today. Right frontoparietal lacunar infarct is redemonstrated as well as moderate to severe small vessel changes. If there is concern for occult infarct, given prior findings on MRI, repeat MRI should be obtained. Electronically Signed   By: Telford Nab M.D.   On: 02/02/2021 06:02   CT ABDOMEN PELVIS W CONTRAST  Result  Date: 02/02/2021 CLINICAL DATA:  Abdominal pain prior appendectomy. EXAM: CT ABDOMEN AND PELVIS WITH CONTRAST TECHNIQUE: Multidetector CT imaging of the abdomen and pelvis was performed using the standard protocol following bolus administration of intravenous contrast. CONTRAST:  56mL OMNIPAQUE IOHEXOL 350 MG/ML SOLN COMPARISON:  [facility] CT abdomen/pelvis exam from 12/23/2020. FINDINGS: Lower chest: 7 mm subpleural right middle lobe nodule on image 4/series 3 is unchanged since abdomen CT 03/21/2014 consistent with benign etiology. Hepatobiliary: No suspicious focal abnormality within the liver parenchyma. There is no evidence for gallstones, gallbladder wall thickening, or pericholecystic fluid. No intrahepatic or extrahepatic biliary dilation. Pancreas: No focal mass lesion. No dilatation of the main duct. No intraparenchymal cyst. No peripancreatic edema. Spleen: No splenomegaly. No focal mass lesion. Adrenals/Urinary Tract: Left adrenal gland unremarkable. 11 mm right adrenal nodule stable since 11/27/2017 consistent with benign etiology such as adenoma. Mild bilateral hydroureteronephrosis noted, new since 12/23/2020. Mucosal hyperenhancement noted in the renal pelvis and ureter bilaterally. Bladder is distended. No suspicious mass lesion or stone disease in either kidney or ureter. Stomach/Bowel: Stomach is unremarkable. No gastric wall thickening. No  evidence of outlet obstruction. Duodenum is normally positioned as is the ligament of Treitz. No small bowel wall thickening. No small bowel dilatation. The terminal ileum is normal. Nonvisualization of the appendix is consistent with the reported history of appendectomy. No gross colonic mass. No colonic wall thickening. Perirectal and presacral edema is similar to prior. Vascular/Lymphatic: There is advanced atherosclerotic calcification of the abdominal aorta without aneurysm. There is no gastrohepatic or hepatoduodenal ligament lymphadenopathy. No retroperitoneal or mesenteric lymphadenopathy. No pelvic sidewall lymphadenopathy. Reproductive: Fiducial markers are noted in the region of the cervix multilocular structure in the posterior right adnexal space into the cul-de-sac has been mildly progressive since 11/27/2017. This has a somewhat tubular configuration and could represent a dilated fallopian tube. Ill-defined soft tissue in the region of the cervix, cul-de-sac, and posterior bladder is presumably treatment related. Recurrent disease not excluded. Other: No substantial intraperitoneal free fluid. Musculoskeletal: No worrisome lytic or sclerotic osseous abnormality. Degenerative disc disease noted L4-5 with diffuse osteopenia. IMPRESSION: 1. Interval development of mild bilateral hydroureteronephrosis with mucosal hyperenhancement in the renal pelvis and ureter bilaterally. No evidence for obstructing stone disease. Imaging features may be related to the distended urinary bladder although the mucosal hyperenhancement would not be expected in that setting. Urinary tract infection would be a consideration. Obstruction due to recurrent disease in this patient with a history of endometrial/cervical cancer is not excluded. 2. Interval progression since 2019 of the multilocular structure in the posterior right adnexal space extending into the cul-de-sac. This has a somewhat tubular configuration and could  represent a dilated Fallopian tube. Pelvic ultrasound may prove helpful to further evaluate. 3. Ill-defined soft tissue in the region of the cervix, cul-de-sac, and posterior bladder is presumably treatment related. Recurrent disease not excluded. 4. Stable 11 mm right adrenal nodule since 11/27/2017 consistent with benign etiology such as adenoma. 5. Stable 11 mm subpleural right middle lobe nodule since 2016 consistent with benign etiology. 6. Aortic Atherosclerosis (ICD10-I70.0). Electronically Signed   By: Misty Stanley M.D.   On: 02/02/2021 06:05   DG ABD ACUTE 2+V W 1V CHEST  Result Date: 02/02/2021 CLINICAL DATA:  Altered mental status with multiple falls and abdominal pain. EXAM: DG ABDOMEN ACUTE WITH 1 VIEW CHEST COMPARISON:  None. FINDINGS: There is no evidence of dilated bowel loops or free intraperitoneal air. No radiopaque calculi or other significant radiographic abnormality is  seen. Small surgical clips are seen overlying the midline of the mid to lower pelvis. Heart size and mediastinal contours are within normal limits. Both lungs are clear. IMPRESSION: Negative abdominal radiographs.  No acute cardiopulmonary disease. Electronically Signed   By: Virgina Norfolk M.D.   On: 02/02/2021 04:02    EKG: Independently reviewed.  No acute ischemic changes; sinus rhythm, positive tachycardia  Assessment/Plan 1-abdominal pain/flank pain: With concerns for UTI -Continue empiric IV antibiotics -Follow culture results -Continue IV fluids -Continue as needed antiemetics and analgesics -Follow response. -Patient with underlying history of endometrial cancer abnormalities appreciated on CT abdomen; will order [name].  2-nausea/vomiting -As needed antiemetics and as needed Reglan for refractory symptoms has been ordered -Will allow full liquid diet to assess tolerance  3-hypertension -Resume home antihypertensive agents.  4-history of stroke -Telemetry monitoring while inpatient -Continue  risk factor modifications -Physical therapy has been asked to reassess patient.  5-hypokalemia/dehydration in the setting of GI losses and poor oral intake -Continue fluid resuscitation -Electrolytes repletion and repeat basic metabolic panel in the morning to follow trend. -Telemetry monitoring in place.  6-type 2 diabetes mellitus -Follow CBGs continue sliding scale insulin.  7-history of tobacco abuse -Continue nicotine patch  8-history of depression -Continue the use of Lexapro -Suicidal ideation or hallucinations appreciated on exam.   DVT prophylaxis: Lovenox Code Status:   Full code Family Communication:  No family at bedside at time of my evaluation; unable to reach granddaughter over the phone. Disposition Plan:   Patient is from:  Home  Anticipated DC to:  To be determined (patient may require rehabilitation in skilled nursing facility prior to returning home).  Anticipated DC date:  To be determined  Anticipated DC barriers: Stabilization of abdominal pain a completion of work-up.  Consults called:  None  Admission status:  Inpatient, telemetry, LOS > 2 midnights.  Severity of Illness: The appropriate patient status for this patient is INPATIENT. Inpatient status is judged to be reasonable and necessary in order to provide the required intensity of service to ensure the patient's safety. The patient's presenting symptoms, physical exam findings, and initial radiographic and laboratory data in the context of their chronic comorbidities is felt to place them at high risk for further clinical deterioration. Furthermore, it is not anticipated that the patient will be medically stable for discharge from the hospital within 2 midnights of admission.   * I certify that at the point of admission it is my clinical judgment that the patient will require inpatient hospital care spanning beyond 2 midnights from the point of admission due to high intensity of service, high risk for  further deterioration and high frequency of surveillance required.Barton Dubois MD [facility]  How to contact the [facility] Attending or Consulting provider [place] or covering provider during after hours [place], for this patient?   Check the care team in [facility] and look for a) attending/consulting TR[facility] provider listed and b) the [facility] team listed Log into [email or web address] and use [place] universal password to access. If you do not have the password, please contact the hospital operator. Locate the [facility] provider you are looking for under [facility] and page to a number that you can be directly reached. If you still have difficulty reaching the provider, please page the [facility] (Director on Call) for the Hospitalists listed on amion for assistance.  02/02/2021, 8:20 AM

## 2021-02-03 ENCOUNTER — Inpatient Hospital Stay (HOSPITAL_COMMUNITY): Payer: Medicare HMO

## 2021-02-03 DIAGNOSIS — N3000 Acute cystitis without hematuria: Secondary | ICD-10-CM | POA: Diagnosis not present

## 2021-02-03 LAB — CBC
HCT: 38.8 % (ref 36.0–46.0)
Hemoglobin: 12.6 g/dL (ref 12.0–15.0)
MCH: 27.9 pg (ref 26.0–34.0)
MCHC: 32.5 g/dL (ref 30.0–36.0)
MCV: 85.8 fL (ref 80.0–100.0)
Platelets: 254 10*3/uL (ref 150–400)
RBC: 4.52 MIL/uL (ref 3.87–5.11)
RDW: 13 % (ref 11.5–15.5)
WBC: 4.5 10*3/uL (ref 4.0–10.5)
nRBC: 0 % (ref 0.0–0.2)

## 2021-02-03 LAB — GLUCOSE, CAPILLARY
Glucose-Capillary: 379 mg/dL — ABNORMAL HIGH (ref 70–99)
Glucose-Capillary: 181 mg/dL — ABNORMAL HIGH (ref 70–99)
Glucose-Capillary: 128 mg/dL — ABNORMAL HIGH (ref 70–99)
Glucose-Capillary: 328 mg/dL — ABNORMAL HIGH (ref 70–99)

## 2021-02-03 LAB — BASIC METABOLIC PANEL
Anion gap: 7 (ref 5–15)
BUN: 11 mg/dL (ref 8–23)
CO2: 22 mmol/L (ref 22–32)
Calcium: 8.3 mg/dL — ABNORMAL LOW (ref 8.9–10.3)
Chloride: 108 mmol/L (ref 98–111)
Creatinine, Ser: 0.63 mg/dL (ref 0.44–1.00)
GFR, Estimated: >60 mL/min (ref >60)
Glucose, Bld: 187 mg/dL — ABNORMAL HIGH (ref 70–99)
Potassium: 3.1 mmol/L — ABNORMAL LOW (ref 3.5–5.1)
Sodium: 137 mmol/L (ref 135–145)

## 2021-02-03 LAB — MAGNESIUM: Magnesium: 1.9 mg/dL (ref 1.7–2.4)

## 2021-02-03 IMAGING — US US ABDOMEN COMPLETE
1 series · 13 of 25 positions shown · non-contrast
Comparison: Abdomen/pelvis CT 1 day prior. Ultrasound exam
[DATE]

CLINICAL DATA: Abdominal pain.

EXAM:
ABDOMEN ULTRASOUND COMPLETE

[Series 1: us abdomen complete · 13 of 189 slices shown]
[im 1/189]
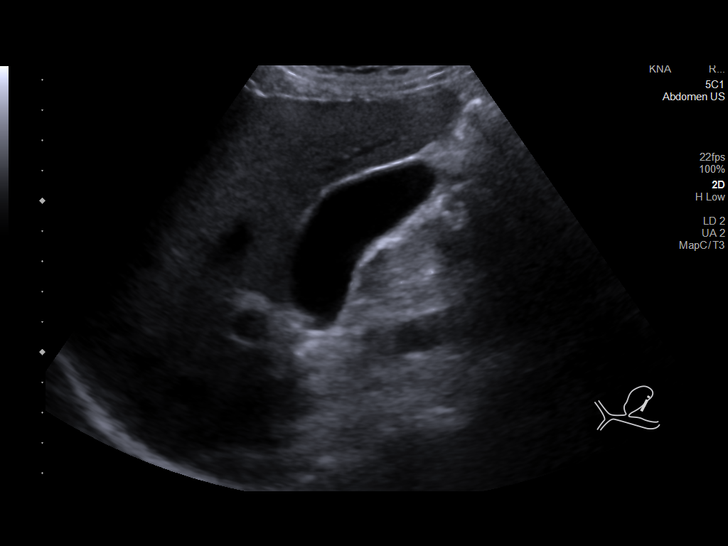
[im 16/189]
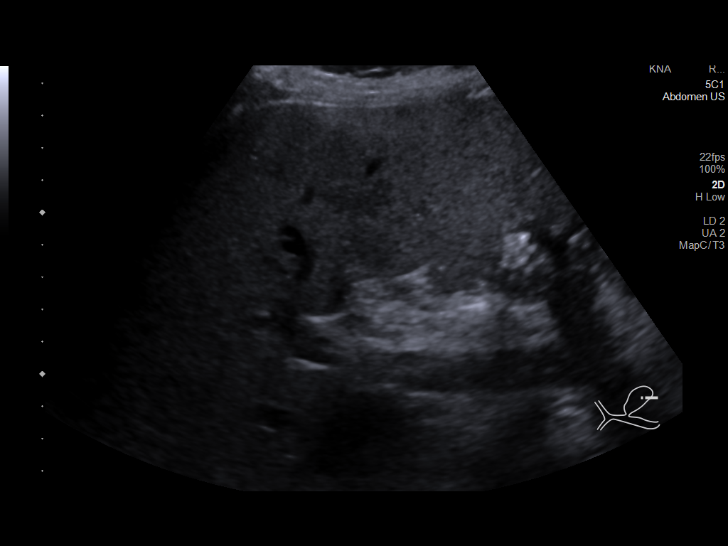
[im 32/189]
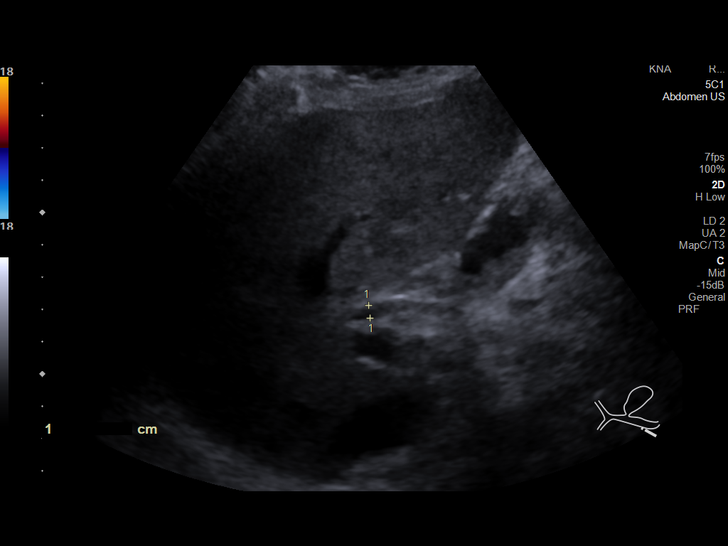
[im 48/189]
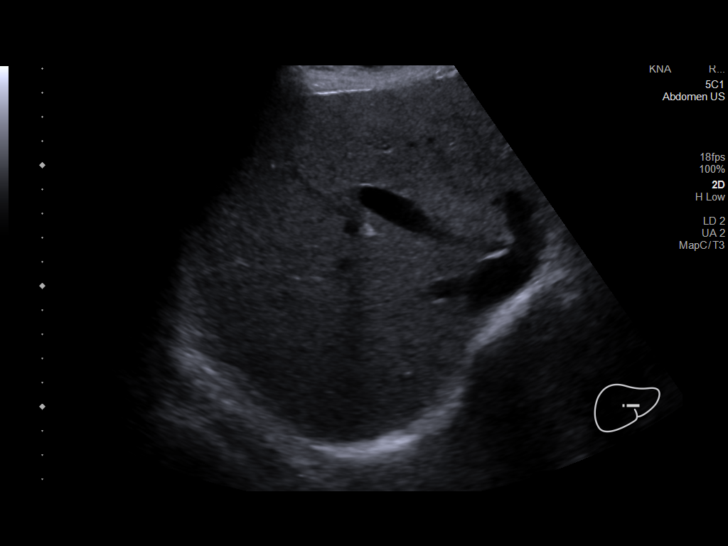
[im 63/189]
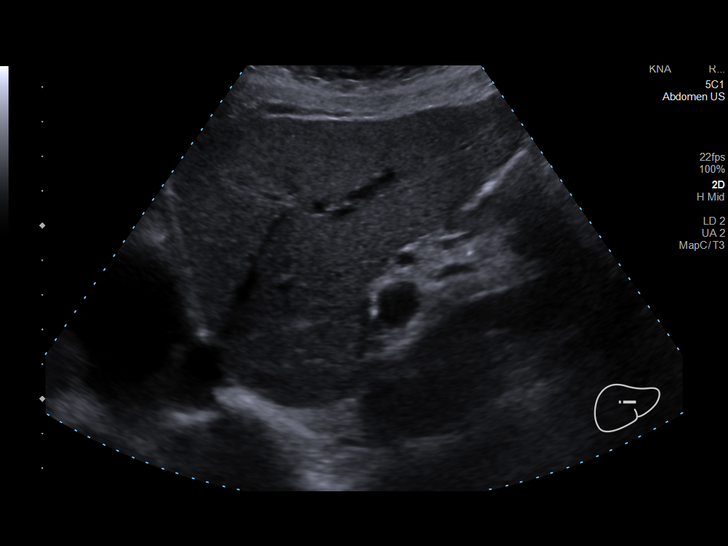
[im 79/189]
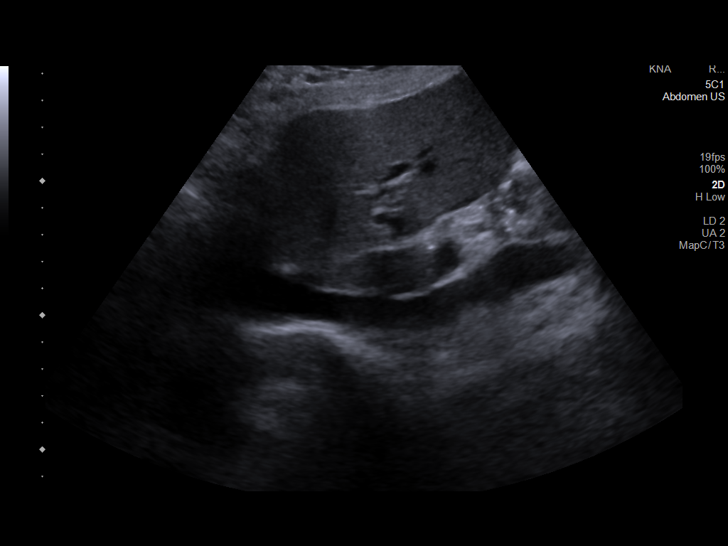
[im 95/189]
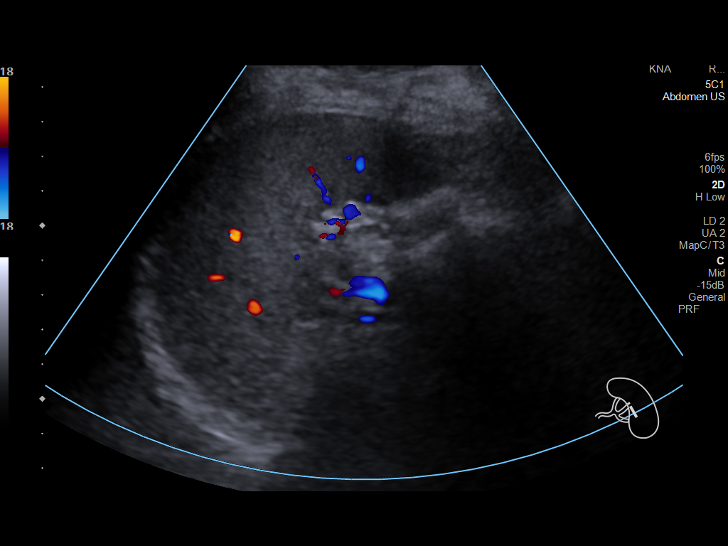
[im 110/189]
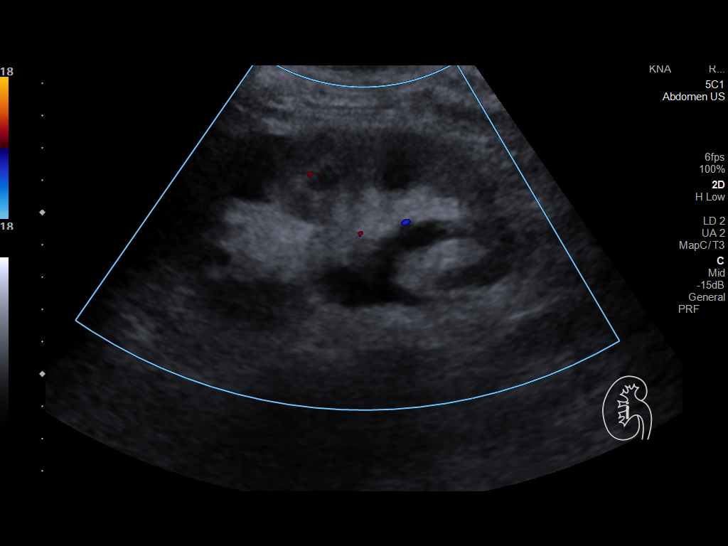
[im 126/189]
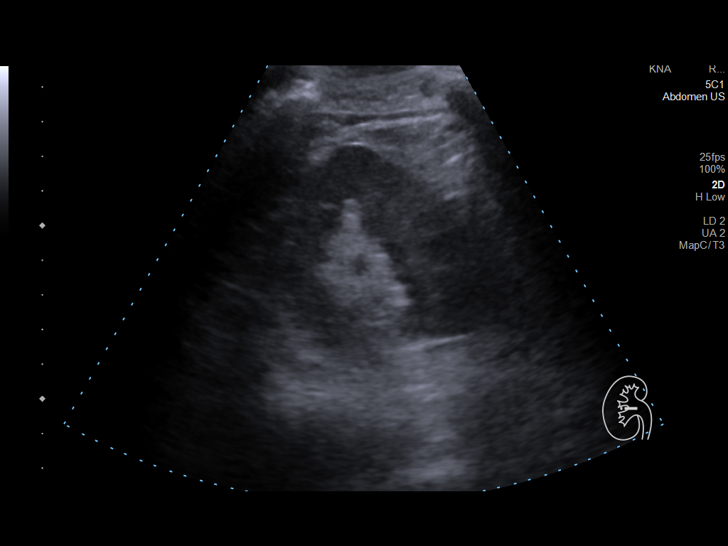
[im 142/189]
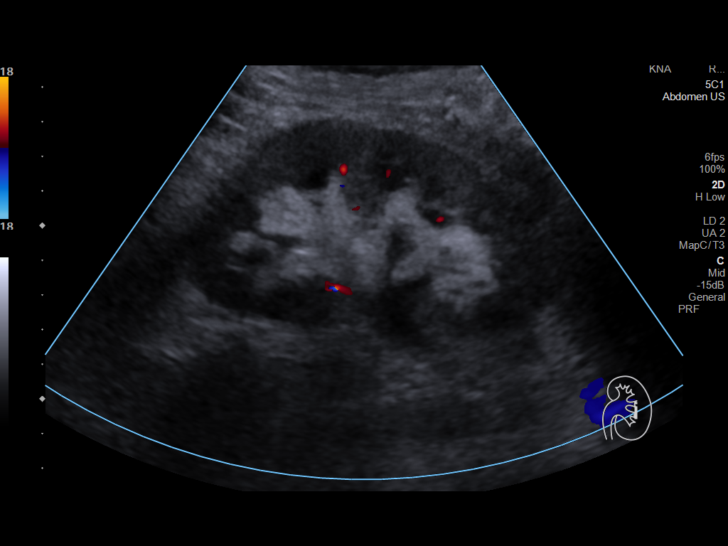
[im 157/189]
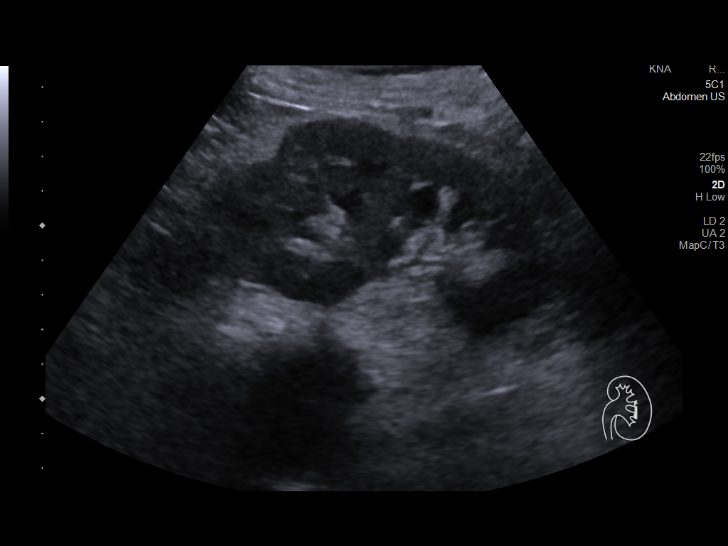
[im 173/189]
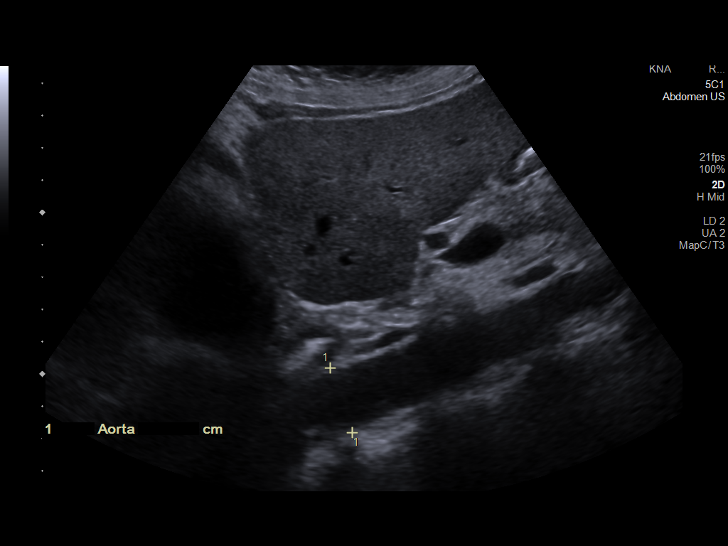
[im 189/189]
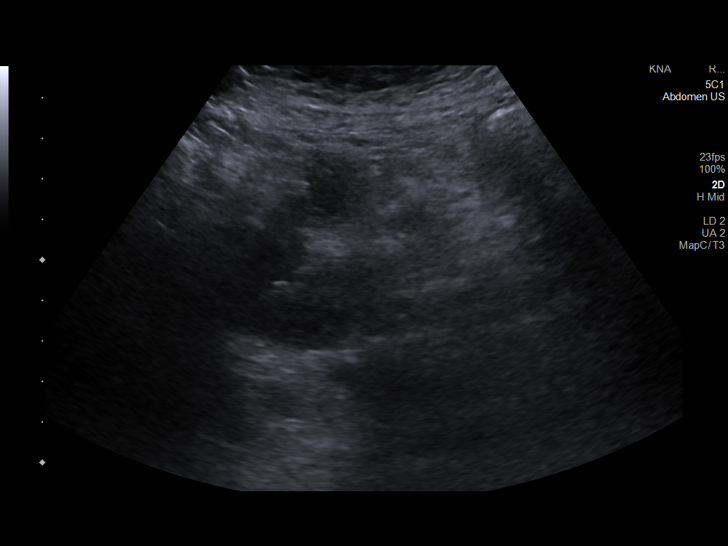

[13 of 25 positions shown; findings below may reference images not displayed]

FINDINGS: Gallbladder: No gallstones or gallbladder wall thickening. No
pericholecystic fluid. The sonographer reports no sonographic
Murphy's sign. Probable adenomyomatosis at the gallbladder fundus
although a tiny focus of pericholecystic fluid could have this
appearance. Sonographer reports no sonographic Murphy sign.
Gallbladder wall thickness borderline to mildly increased at 3-4 mm.

Common bile duct: Diameter: 4 mm

Liver: Mildly increased echogenicity without focal mass lesion
evident. Portal vein is patent on color Doppler imaging with normal
direction of blood flow towards the liver.

IVC: No abnormality visualized.

Pancreas: Visualized portion unremarkable.

Spleen: Size and appearance within normal limits.

Right Kidney: Length: 11.0 cm. Echogenicity within normal limits.
Mild hydronephrosis visualized.

Left Kidney: Length: 10.9 cm. Echogenicity within normal limits.
Mild hydronephrosis visualized.

Abdominal aorta: No aneurysm visualized.

Other findings: None.
IMPRESSION: 1. Mild bilateral hydronephrosis. Similar findings noted on
yesterday's CT.
2. Probable adenomyomatosis at the gallbladder fundus although a
tiny focus of pericholecystic fluid could have this appearance. No
gallstones evident but gallbladder wall thickness is upper normal to
mildly increased. If there is clinical concern for cystic duct
obstruction, nuclear scintigraphy could be used to further evaluate.

## 2021-02-03 MED ORDER — INSULIN GLARGINE-YFGN 100 UNIT/ML ~~LOC~~ SOLN
18.0000 [IU] | Freq: Two times a day (BID) | SUBCUTANEOUS | Status: DC
  Administered 2021-02-03 – 2021-02-07 (×8): 18 [IU] via SUBCUTANEOUS
  Filled 2021-02-03 (×10): qty 0.18

## 2021-02-03 MED ORDER — POTASSIUM CHLORIDE IN NACL 40-0.9 MEQ/L-% IV SOLN: INTRAVENOUS | Status: AC

## 2021-02-03 MED ORDER — POTASSIUM CHLORIDE CRYS ER 20 MEQ PO TBCR
40.0000 meq | EXTENDED_RELEASE_TABLET | Freq: Once | ORAL | Status: AC
  Administered 2021-02-03: 40 meq via ORAL
  Filled 2021-02-03: qty 2

## 2021-02-03 NOTE — [place] FL2 (Signed)
[place] MEDICAID FL2 LEVEL OF CARE SCREENING TOOL     IDENTIFICATION  Patient Name: Cheyenne [name] Birthdate: 1950/11/24 Sex: female Admission Date (Current Location): 02/02/2021  [facility] and [place] Number:  [employer] and Address:  [facility] [address], [place]      Provider Number: [id number]  Attending Physician Name and Address:  Rodena Goldmann, DO  Relative Name and Phone Number:       Current Level of Care: Hospital Recommended Level of Care: [place] Prior Approval Number:    Date Approved/Denied:   PASRR Number: [id number]  Discharge Plan: SNF    Current Diagnoses: Patient Active Problem List   Diagnosis Date Noted   UTI (urinary tract infection) 02/02/2021   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 ([place]) 01/24/2021   Protein-calorie malnutrition, severe 11/18/2020   Enterocolitis 11/10/2020   Abdominal pain 11/10/2020   Nausea & vomiting 11/10/2020   AKI (acute kidney injury) ([place]) 11/10/2020   Hyperglycemia due to diabetes mellitus ([place]) 11/10/2020   Leukocytosis 11/10/2020   Mixed hyperlipidemia 11/10/2020   Tobacco use 11/10/2020   Asthma 11/10/2020   SIRS (systemic inflammatory response syndrome) ([place]) 11/10/2020   Restless leg syndrome 11/10/2020   Vitamin D deficiency 11/10/2020   Enteritis 11/10/2020   Cerebrovascular accident (CVA) ([place])    Generalized weakness 10/25/2020   Essential hypertension 10/25/2020   Type 2 diabetes mellitus with hyperlipidemia ([place]) 10/25/2020   Psychiatric problem 10/25/2020    Orientation RESPIRATION BLADDER Height & Weight     Self, Time, Situation, Place  Normal Incontinent, Indwelling catheter Weight: 59.3 kg Height:  5\' 4"  (162.6 cm)  BEHAVIORAL SYMPTOMS/MOOD NEUROLOGICAL BOWEL NUTRITION STATUS      Incontinent Diet  AMBULATORY STATUS COMMUNICATION OF NEEDS Skin   Extensive Assist Verbally Normal                        Personal Care Assistance Level of Assistance  Bathing, Dressing, Total care Bathing Assistance: Maximum assistance   Dressing Assistance: Limited assistance Total Care Assistance: Maximum assistance   Functional Limitations Info             SPECIAL CARE FACTORS FREQUENCY  PT (By licensed PT), OT (By licensed OT)     PT Frequency: 5x weekly OT Frequency: 5x weekly            Contractures Contractures Info: Not present    Additional Factors Info  Code Status, Allergies Code Status Info: Full Allergies Info: clonazepam, gabapentin, pregabalin, codeine           Current Medications (02/03/2021):  This is the current hospital active medication list Current Facility-Administered Medications  Medication Dose Route Frequency Provider Last Rate Last Admin   0.9 % NaCl with KCl 40 mEq / L  infusion   Intravenous Continuous Barton Dubois, MD 75 mL/hr at 02/03/21 0833 New Bag at 02/03/21 0833   acetaminophen (TYLENOL) tablet 650 mg  650 mg Oral Q6H PRN Barton Dubois, MD       Or   acetaminophen (TYLENOL) suppository 650 mg  650 mg Rectal Q6H PRN Barton Dubois, MD       albuterol (PROVENTIL) (2.5 MG/3ML) 0.083% nebulizer solution 3 mL  3 mL Nebulization Q6H PRN Barton Dubois, MD       amLODipine (NORVASC) tablet 10 mg  10 mg Oral Daily Barton Dubois, MD   10 mg at 02/02/21 1123  aspirin EC tablet 81 mg  81 mg Oral Daily Barton Dubois, MD   81 mg at 02/02/21 1123   atorvastatin (LIPITOR) tablet 80 mg  80 mg Oral q1800 Barton Dubois, MD   80 mg at 02/02/21 1123   cefTRIAXone (ROCEPHIN) 1 g in sodium chloride 0.9 % 100 mL IVPB  1 g Intravenous Q24H Barton Dubois, MD 200 mL/hr at 02/03/21 0645 1 g at 02/03/21 0645   Chlorhexidine Gluconate Cloth 2 % PADS 6 each  6 each Topical Daily Barton Dubois, MD   6 each at 02/02/21 1728   clopidogrel (PLAVIX) tablet 75 mg  75 mg Oral Daily Barton Dubois, MD   75 mg at 02/02/21 1123   enoxaparin (LOVENOX) injection  40 mg  40 mg Subcutaneous Q24H Barton Dubois, MD   40 mg at 02/02/21 1121   escitalopram (LEXAPRO) tablet 20 mg  20 mg Oral Daily Barton Dubois, MD   20 mg at 02/02/21 1123   fentaNYL (SUBLIMAZE) injection 100 mcg  100 mcg Intravenous Q3H PRN Barton Dubois, MD       insulin aspart (novoLOG) injection 0-15 Units  0-15 Units Subcutaneous TID [facility] Barton Dubois, MD   15 Units at 02/03/21 0756   insulin glargine-yfgn [facility]) injection 10 Units  10 Units Subcutaneous BID Barton Dubois, MD   10 Units at 02/02/21 2217   losartan (COZAAR) tablet 50 mg  50 mg Oral BID Barton Dubois, MD   50 mg at 02/02/21 2217   metoCLOPramide (REGLAN) injection 10 mg  10 mg Intravenous Q8H PRN Barton Dubois, MD       nicotine (NICODERM CQ - dosed in mg/24 hours) patch 14 mg  14 mg Transdermal Daily Barton Dubois, MD   14 mg at 02/02/21 1121   ondansetron (ZOFRAN) tablet 4 mg  4 mg Oral Q6H PRN Barton Dubois, MD       Or   ondansetron [facility]) injection 4 mg  4 mg Intravenous Q6H PRN Barton Dubois, MD       rOPINIRole (REQUIP) tablet 0.25 mg  0.25 mg Oral QHS Barton Dubois, MD   0.25 mg at 02/02/21 2217     Discharge Medications: Please see discharge summary for a list of discharge medications.  Relevant Imaging Results:  Relevant Lab Results:   Additional Information SS# [id number]  Joaquin Courts, RN

## 2021-02-03 NOTE — TOC Progression Note (Signed)
Transition of Care [facility]) - Progression Note    Patient Details  Name: Cheyenne [name] MRN: [id number] Date of Birth: 26-Dec-1950  Transition of [facility]) CM/SW Contact  Shade Flood, LCSW Phone Number: 02/03/2021, 4:57 PM  Clinical Narrative:     TOC following. Spoke with granddaughter at 61 to provide bed offers. She states she is not interested in either facility as they will be too far from pt's new residence. She asks for additional referrals to [facility]. Two additional referrals made to facilities in the [place] hub. [facility] states that they are not in network with pt's insurance. Message left for [facility] to inform of referral. Have not yet heard back.   Granddaughter states that she may decide to take pt home with [facility] if she is able. TOC will continue to follow.  Expected Discharge Plan: Skilled Nursing Facility Barriers to Discharge: SNF Pending bed offer  Expected Discharge Plan and Services Expected Discharge Plan: [place]   Discharge Planning Services: CM Consult Post Acute Care Choice: [place] Living arrangements for the past 2 months: Mobile Home                 DME Arranged: N/A                     Social Determinants of Health (SDOH) Interventions    Readmission Risk Interventions Readmission Risk Prevention Plan 02/03/2021 01/25/2021  Medication Screening - Complete  Transportation Screening Complete Complete  HRI or Home Care Consult Complete -  Social Work Consult for [place] Planning/Counseling Complete -  Palliative Care Screening Not Applicable -  Medication Review Press photographer) Complete -  Some encounter information is confidential and restricted. Go to Review Flowsheets activity to see all data.  Some recent data might be hidden

## 2021-02-03 NOTE — TOC Initial Note (Signed)
Transition of Care [facility]) - Initial/Assessment Note    Patient Details  Name: Cheyenne [name] MRN: [id number] Date of Birth: 10-12-1950  Transition of Care (TOC) CM/SW Contact:    Joaquin Courts, RN Phone Number: 02/03/2021, 11:38 AM  Clinical Narrative:   CM reached out to patient's granddaughter to discuss discharge planning.   Patient lives with granddaughter and they are in the process of moving to [place], [place].  Granddaughter reports patient normally independent at baseline, however has been very weak and falling daily since discharge from hospital 1 week ago after a stroke.  Granddaughter feels strongly patient needs short term rehab, reports has been in SNF twice this year after two previous strokes and this helped the patient regain her strength and independence.  CM discussed PT recommendations for SNF for short term rehab, SNF placement process including need for insurance authorization and that based on information provided by granddaughter patient is likely to be in co-pay days.  Granddaughter is concerned about the financial implications of having a daily co-pay, but would still like to pursue SNF.  FL2 faxed out to area SNF, graddaughter would ideally like something in [place] but is also open to [facility].                Expected Discharge Plan: Skilled Nursing Facility Barriers to Discharge: SNF Pending bed offer   Patient Goals and CMS Choice Patient states their goals for this hospitalization and ongoing recovery are:: to get better CMS Medicare.gov Compare Post Acute Care list provided to:: Patient Represenative (must comment) Choice offered to / list presented to : Adult Children (granddaughter)  Expected Discharge Plan and Services Expected Discharge Plan: [place]   Discharge Planning Services: CM Consult Post Acute Care Choice: [place] Living arrangements for the past 2 months: Mobile Home                 DME  Arranged: N/A                    Prior Living Arrangements/Services Living arrangements for the past 2 months: Mobile Home Lives with:: Other (Comment) (granddaughter) Patient language and need for interpreter reviewed:: Yes Do you feel safe going back to the place where you live?: Yes      Need for Family Participation in Patient Care: Yes (Comment) Care giver support system in place?: Yes (comment) Current home services: Home PT, Home OT, Home RT Criminal Activity/Legal Involvement Pertinent to Current Situation/Hospitalization: No - Comment as needed  Activities of Daily Living Home Assistive Devices/Equipment: Walker (specify type) ADL Screening (condition at time of admission) Patient's cognitive ability adequate to safely complete daily activities?: No Is the patient deaf or have difficulty hearing?: No Does the patient have difficulty seeing, even when wearing glasses/contacts?: No Does the patient have difficulty concentrating, remembering, or making decisions?: Yes Patient able to express need for assistance with ADLs?: No Does the patient have difficulty dressing or bathing?: Yes Independently performs ADLs?: No Communication: Independent Dressing (OT): Needs assistance Is this a change from baseline?: Pre-admission baseline Grooming: Needs assistance Is this a change from baseline?: Pre-admission baseline Feeding: Needs assistance Is this a change from baseline?: Pre-admission baseline Bathing: Needs assistance Is this a change from baseline?: Pre-admission baseline Toileting: Needs assistance Is this a change from baseline?: Pre-admission baseline In/Out Bed: Needs assistance Is this a change from baseline?: Pre-admission baseline Walks in Home: Needs assistance Is this a change from baseline?: Pre-admission  baseline Does the patient have difficulty walking or climbing stairs?: Yes Weakness of Legs: Both Weakness of Arms/Hands: Both  Permission  Sought/Granted                  Emotional Assessment Appearance:: Appears stated age Attitude/Demeanor/Rapport: Engaged Affect (typically observed): Accepting Orientation: : Oriented to Self, Oriented to Place, Oriented to  Time, Oriented to Situation Alcohol / Substance Use: Not Applicable Psych Involvement: No (comment)  Admission diagnosis:  UTI (urinary tract infection) [N39.0] Fever [R50.9] Abdominal pain, unspecified abdominal location [R10.9] Fever, unspecified fever cause [R50.9] Patient Active Problem List   Diagnosis Date Noted   UTI (urinary tract infection) 02/02/2021   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) 11/10/2020   Leukocytosis 11/10/2020   Mixed hyperlipidemia 11/10/2020   Tobacco use 11/10/2020   Asthma 11/10/2020   SIRS (systemic inflammatory response syndrome) ([place]) 11/10/2020   Restless leg syndrome 11/10/2020   Vitamin D deficiency 11/10/2020   Enteritis 11/10/2020   Cerebrovascular accident (CVA) ([place])    Generalized weakness 10/25/2020   Essential hypertension 10/25/2020   Type 2 diabetes mellitus with hyperlipidemia ([place]) 10/25/2020   Psychiatric problem 10/25/2020   PCP:  Toni Arthurs, PA Pharmacy:   [facility], [place] [address] [place] [address] Phone: [phone] Fax: [phone]     Social Determinants of Health (SDOH) Interventions    Readmission Risk Interventions Readmission Risk Prevention Plan 02/03/2021 01/25/2021  Medication Screening - Complete  Transportation Screening Complete Complete  HRI or Home Care Consult Complete -  Social Work Consult for [place] Planning/Counseling Complete -   Palliative Care Screening Not Applicable -  Medication Review (RN Care Manager) Complete -  Some encounter information is confidential and restricted. Go to Review Flowsheets activity to see all data.  Some recent data might be hidden

## 2021-02-03 NOTE — Progress Notes (Signed)
PROGRESS NOTE    [name]  [name]:[id number] DOB: May 25, 1950 DOA: 02/02/2021 PCP: Toni Arthurs, PA   Brief Narrative:   Cheyenne [name] is a 70 y.o. female with medical history significant of type 2 diabetes mellitus, hyperlipidemia, hypertension, prior history of endometrial cancer, peripheral neuropathy and recent hospitalization secondary to a stroke with residual left-sided deficits; who presented to the hospital secondary to abdominal pain, fever, nausea/vomiting and diarrhea.  She was admitted with UTI, and has been started on empiric IV antibiotics however cultures have not been obtained.  She is overall feeling much better.  Ultrasound abdomen with ongoing hydronephrosis noted that may be related to urinary retention issues for which she has a Foley catheter.  Plans for hemodialysis on 12/2.  Assessment & Plan:   Principal Problem:   UTI (urinary tract infection)   1-abdominal pain/flank pain: With concerns for UTI -Continue empiric IV antibiotics -Unfortunately urine cultures have not been obtained -Hold further IV fluid -Continue as needed antiemetics and analgesics -Follow response. -Ultrasound on 12/2 with ongoing hydronephrosis and findings of adenomyomatosis of the gallbladder   2-nausea/vomiting-resolved -As needed antiemetics and as needed Reglan for refractory symptoms has been ordered -Advance diet today   3-hypertension -Resume home antihypertensive agents.   4-history of stroke -Telemetry monitoring while inpatient -Continue risk factor modifications -Physical therapy has been asked to reassess patient.   5-hypokalemia/dehydration in the setting of GI losses and poor oral intake -Continue potassium supplementation and IV fluid  6-type 2 diabetes mellitus -Follow CBGs continue sliding scale insulin.   7-history of tobacco abuse -Continue nicotine patch   8-history of depression -Continue the use of Lexapro -Suicidal ideation or  hallucinations appreciated on exam.   DVT prophylaxis:Lovenox Code Status: Full Family Communication:  Disposition Plan:  Status is: Inpatient  Remains inpatient appropriate because: Requires IV medications and SNF placement  Consultants:  None  Procedures:  See below  Antimicrobials:  Anti-infectives (From admission, onward)    Start     Dose/Rate Route Frequency Ordered Stop   02/03/21 0600  cefTRIAXone (ROCEPHIN) 1 g in sodium chloride 0.9 % 100 mL IVPB        1 g 200 mL/hr over 30 Minutes Intravenous Every 24 hours 02/02/21 0812     02/02/21 0615  cefTRIAXone (ROCEPHIN) 1 g in sodium chloride 0.9 % 100 mL IVPB        1 g 200 mL/hr over 30 Minutes Intravenous  Once 02/02/21 0610 02/02/21 0801       Subjective: Patient seen and evaluated today with no new acute complaints or concerns. No acute concerns or events noted overnight.  She denies any further abdominal pain, nausea, vomiting, or diarrhea.  She is ready to have her diet advanced.  Objective: Vitals:   02/02/21 1423 02/02/21 1805 02/02/21 2000 02/03/21 0518  BP: 138/63 (!) 129/55 140/61 134/85  Pulse: (!) 110 96 85 100  Resp: 20 20 19 19   Temp: 98.8 F (37.1 C) 98.3 F (36.8 C) 98.2 F (36.8 C) 98.1 F (36.7 C)  TempSrc: Oral Oral    SpO2: 94% 94% 98% 96%  Weight:      Height:        Intake/Output Summary (Last 24 hours) at 02/03/2021 1226 Last data filed at 02/03/2021 0900 Gross per 24 hour  Intake 1363.85 ml  Output 900 ml  Net 463.85 ml   Filed Weights   02/02/21 0302  Weight: 59.3 kg    Examination:  General exam: Appears  calm and comfortable  Respiratory system: Clear to auscultation. Respiratory effort normal. Cardiovascular system: S1 & S2 heard, RRR.  Gastrointestinal system: Abdomen is soft Central nervous system: Alert and awake Extremities: No edema Skin: No significant lesions noted Psychiatry: Flat affect.    Data Reviewed: I have personally reviewed following labs and  imaging studies  CBC: Recent Labs  Lab 02/02/21 0312 02/02/21 0845 02/03/21 0442  WBC 5.0 4.6 4.5  NEUTROABS 3.8  --   --   HGB 13.4 12.8 12.6  HCT 40.1 38.4 38.8  MCV 84.4 85.1 85.8  PLT 281 280 295   Basic Metabolic Panel: Recent Labs  Lab 02/02/21 0312 02/02/21 0845 02/03/21 0442  NA 132*  --  137  K 2.9*  --  3.1*  CL 99  --  108  CO2 23  --  22  GLUCOSE 318*  --  187*  BUN 17  --  11  CREATININE 0.84 0.74 0.63  CALCIUM 8.6*  --  8.3*  MG  --  1.8 1.9  PHOS  --  3.4  --    GFR: Estimated Creatinine Clearance: 57.3 mL/min (by C-G formula based on SCr of 0.63 mg/dL). Liver Function Tests: Recent Labs  Lab 02/02/21 0312  AST 9*  ALT 11  ALKPHOS 79  BILITOT 0.5  PROT 6.9  ALBUMIN 3.3*   Recent Labs  Lab 02/02/21 0312  LIPASE 23   Recent Labs  Lab 02/02/21 0312  AMMONIA 14   Coagulation Profile: No results for input(s): INR, PROTIME in the last 168 hours. Cardiac Enzymes: No results for input(s): CKTOTAL, CKMB, CKMBINDEX, TROPONINI in the last 168 hours. BNP (last 3 results) No results for input(s): PROBNP in the last 8760 hours. HbA1C: No results for input(s): HGBA1C in the last 72 hours. CBG: Recent Labs  Lab 02/02/21 1217 02/02/21 1602 02/02/21 1959 02/03/21 0747 02/03/21 1138  GLUCAP 210* 125* 235* 379* 181*   Lipid Profile: No results for input(s): CHOL, HDL, LDLCALC, TRIG, CHOLHDL, LDLDIRECT in the last 72 hours. Thyroid Function Tests: Recent Labs    02/02/21 0312  TSH 0.764   Anemia Panel: No results for input(s): VITAMINB12, FOLATE, FERRITIN, TIBC, IRON, RETICCTPCT in the last 72 hours. Sepsis Labs: Recent Labs  Lab 02/02/21 0359  LATICACIDVEN 1.1    Recent Results (from the past 240 hour(s))  Resp Panel by RT-PCR (Flu A&B, Covid) Nasopharyngeal Swab     Status: None   Collection Time: 01/24/21  7:30 PM   Specimen: Nasopharyngeal Swab; Nasopharyngeal(NP) swabs in vial transport medium  Result Value Ref Range Status    SARS Coronavirus 2 by RT PCR NEGATIVE NEGATIVE Final    Comment: (NOTE) SARS-CoV-2 target nucleic acids are NOT DETECTED.  The SARS-CoV-2 RNA is generally detectable in upper respiratory specimens during the acute phase of infection. The lowest concentration of SARS-CoV-2 viral copies this assay can detect is 138 copies/mL. A negative result does not preclude SARS-Cov-2 infection and should not be used as the sole basis for treatment or other patient management decisions. A negative result may occur with  improper specimen collection/handling, submission of specimen other than nasopharyngeal swab, presence of viral mutation(s) within the areas targeted by this assay, and inadequate number of viral copies(<138 copies/mL). A negative result must be combined with clinical observations, patient history, and epidemiological information. The expected result is Negative.  Fact Sheet for Patients:  EntrepreneurPulse.com.au  Fact Sheet for Healthcare Providers:  IncredibleEmployment.be  This test is no t yet approved  or cleared by the [place] and  has been authorized for detection and/or diagnosis of SARS-CoV-2 by FDA under an Emergency Use Authorization (EUA). This EUA will remain  in effect (meaning this test can be used) for the duration of the COVID-19 declaration under Section 564(b)(1) of the Act, 21 U.S.C.section 360bbb-3(b)(1), unless the authorization is terminated  or revoked sooner.       Influenza A by PCR NEGATIVE NEGATIVE Final   Influenza B by PCR NEGATIVE NEGATIVE Final    Comment: (NOTE) The Xpert Xpress SARS-CoV-2/FLU/RSV plus assay is intended as an aid in the diagnosis of influenza from Nasopharyngeal swab specimens and should not be used as a sole basis for treatment. Nasal washings and aspirates are unacceptable for Xpert Xpress SARS-CoV-2/FLU/RSV testing.  Fact Sheet for  Patients: EntrepreneurPulse.com.au  Fact Sheet for Healthcare Providers: IncredibleEmployment.be  This test is not yet approved or cleared by the [place] FDA and has been authorized for detection and/or diagnosis of SARS-CoV-2 by FDA under an Emergency Use Authorization (EUA). This EUA will remain in effect (meaning this test can be used) for the duration of the COVID-19 declaration under Section 564(b)(1) of the Act, 21 U.S.C. section 360bbb-3(b)(1), unless the authorization is terminated or revoked.  Performed at [facility], [address], [place], [place] [address]   Resp Panel by RT-PCR (Flu A&B, Covid) Nasopharyngeal Swab     Status: None   Collection Time: 02/02/21  4:10 AM   Specimen: Nasopharyngeal Swab; Nasopharyngeal(NP) swabs in vial transport medium  Result Value Ref Range Status   SARS Coronavirus 2 by RT PCR NEGATIVE NEGATIVE Final    Comment: (NOTE) SARS-CoV-2 target nucleic acids are NOT DETECTED.  The SARS-CoV-2 RNA is generally detectable in upper respiratory specimens during the acute phase of infection. The lowest concentration of SARS-CoV-2 viral copies this assay can detect is 138 copies/mL. A negative result does not preclude SARS-Cov-2 infection and should not be used as the sole basis for treatment or other patient management decisions. A negative result may occur with  improper specimen collection/handling, submission of specimen other than nasopharyngeal swab, presence of viral mutation(s) within the areas targeted by this assay, and inadequate number of viral copies(<138 copies/mL). A negative result must be combined with clinical observations, patient history, and epidemiological information. The expected result is Negative.  Fact Sheet for Patients:  EntrepreneurPulse.com.au  Fact Sheet for Healthcare Providers:  IncredibleEmployment.be  This test is no t yet approved  or cleared by the [place] FDA and  has been authorized for detection and/or diagnosis of SARS-CoV-2 by FDA under an Emergency Use Authorization (EUA). This EUA will remain  in effect (meaning this test can be used) for the duration of the COVID-19 declaration under Section 564(b)(1) of the Act, 21 U.S.C.section 360bbb-3(b)(1), unless the authorization is terminated  or revoked sooner.       Influenza A by PCR NEGATIVE NEGATIVE Final   Influenza B by PCR NEGATIVE NEGATIVE Final    Comment: (NOTE) The Xpert Xpress SARS-CoV-2/FLU/RSV plus assay is intended as an aid in the diagnosis of influenza from Nasopharyngeal swab specimens and should not be used as a sole basis for treatment. Nasal washings and aspirates are unacceptable for Xpert Xpress SARS-CoV-2/FLU/RSV testing.  Fact Sheet for Patients: EntrepreneurPulse.com.au  Fact Sheet for Healthcare Providers: IncredibleEmployment.be  This test is not yet approved or cleared by the [place] FDA and has been authorized for detection and/or diagnosis of SARS-CoV-2 by FDA under an Emergency  Use Authorization (EUA). This EUA will remain in effect (meaning this test can be used) for the duration of the COVID-19 declaration under Section 564(b)(1) of the Act, 21 U.S.C. section 360bbb-3(b)(1), unless the authorization is terminated or revoked.  Performed at [facility], [address], [place], [place] [address]          Radiology Studies: CT Head Wo Contrast  Result Date: 02/02/2021 CLINICAL DATA:  Neuro deficit, stroke suspected, recent CVA. Frequent falls. Altered mental status. Left arm weakness. EXAM: CT HEAD WITHOUT CONTRAST TECHNIQUE: Contiguous axial images were obtained from the base of the skull through the vertex without intravenous contrast. COMPARISON:  MRI brain and head CT both 01/24/2021, with MRI showing new punctate or subacute infarcts in left frontal lobe and old  lacunar infarct in the right parietal lobe with surrounding diffusion restriction, CT showing a 7 mm hyperintensity laterally in the left frontal lobe and potential tiny subarachnoid bleed in the right parietal lobe. FINDINGS: Brain: There was previously a 7 mm hyperdensity at the gray-white matter interface laterally in the upper left frontal lobe which is no longer seen as well as suspected trace subarachnoid bleed in the right parietal lobe just above this same level, also no longer seen. The scattered tiny left frontal cortical infarcts noted on MRI are not well recharacterized with CT. A 1 cm chronic appearing lacunar infarct in the right frontoparietal subcortical white matter is again noted as well as mild atrophy and moderate to severe small vessel disease in the cerebral white matter. Cerebellum and brainstem are unremarkable, as visualized. No new abnormality is seen by CT. No midline shift. Vascular: There are no hyperdense central vessels. Scattered calcific plaque in the carotid siphons is again noted. Skull: Normal. Negative for fracture or focal lesion. Sinuses/Orbits: No acute finding. Other: None. IMPRESSION: No acute intracranial CT findings. Small foci of left frontal and right parietal CT hyperdensity on the CT 9 days ago are not seen today. Right frontoparietal lacunar infarct is redemonstrated as well as moderate to severe small vessel changes. If there is concern for occult infarct, given prior findings on MRI, repeat MRI should be obtained. Electronically Signed   By: Telford Nab M.D.   On: 02/02/2021 06:02   US Abdomen Complete  Result Date: 02/03/2021 CLINICAL DATA:  Abdominal pain. EXAM: ABDOMEN ULTRASOUND COMPLETE COMPARISON:  Abdomen/pelvis CT 1 day prior. Ultrasound exam 03/21/2014 FINDINGS: Gallbladder: No gallstones or gallbladder wall thickening. No pericholecystic fluid. The sonographer reports no sonographic Murphy's sign. Probable adenomyomatosis at the gallbladder fundus  although a tiny focus of pericholecystic fluid could have this appearance. Sonographer reports no sonographic Murphy sign. Gallbladder wall thickness borderline to mildly increased at 3-4 mm. Common bile duct: Diameter: 4 mm Liver: Mildly increased echogenicity without focal mass lesion evident. Portal vein is patent on color Doppler imaging with normal direction of blood flow towards the liver. IVC: No abnormality visualized. Pancreas: Visualized portion unremarkable. Spleen: Size and appearance within normal limits. Right Kidney: Length: 11.0 cm. Echogenicity within normal limits. Mild hydronephrosis visualized. Left Kidney: Length: 10.9 cm. Echogenicity within normal limits. Mild hydronephrosis visualized. Abdominal aorta: No aneurysm visualized. Other findings: None. IMPRESSION: 1. Mild bilateral hydronephrosis. Similar findings noted on yesterday's CT. 2. 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 No gallstones evident but gallbladder wall thickness is upper normal to mildly increased. If there is clinical concern for cystic duct obstruction, nuclear scintigraphy could be used to  further evaluate. Electronically Signed   By: Misty Stanley M.D.   On: 02/03/2021 09:47   CT ABDOMEN PELVIS W CONTRAST  Result Date: 02/02/2021 CLINICAL DATA:  Abdominal pain prior appendectomy. EXAM: CT ABDOMEN AND PELVIS WITH CONTRAST TECHNIQUE: Multidetector CT imaging of the abdomen and pelvis was performed using the standard protocol following bolus administration of intravenous contrast. CONTRAST:  67mL OMNIPAQUE IOHEXOL 350 MG/ML SOLN COMPARISON:  [facility] CT abdomen/pelvis exam from 12/23/2020. FINDINGS: Lower chest: 7 mm subpleural right middle lobe nodule on image 4/series 3 is unchanged since abdomen CT 03/21/2014 consistent with benign etiology. Hepatobiliary: No suspicious focal abnormality within the liver parenchyma. There is  no evidence for gallstones, gallbladder wall thickening, or pericholecystic fluid. No intrahepatic or extrahepatic biliary dilation. Pancreas: No focal mass lesion. No dilatation of the main duct. No intraparenchymal cyst. No peripancreatic edema. Spleen: No splenomegaly. No focal mass lesion. Adrenals/Urinary Tract: Left adrenal gland unremarkable. 11 mm right adrenal nodule stable since 11/27/2017 consistent with benign etiology such as adenoma. Mild bilateral hydroureteronephrosis noted, new since 12/23/2020. Mucosal hyperenhancement noted in the renal pelvis and ureter bilaterally. Bladder is distended. No suspicious mass lesion or stone disease in either kidney or ureter. Stomach/Bowel: Stomach is unremarkable. No gastric wall thickening. No evidence of outlet obstruction. Duodenum is normally positioned as is the ligament of Treitz. No small bowel wall thickening. No small bowel dilatation. The terminal ileum is normal. Nonvisualization of the appendix is consistent with the reported history of appendectomy. No gross colonic mass. No colonic wall thickening. Perirectal and presacral edema is similar to prior. Vascular/Lymphatic: There is advanced atherosclerotic calcification of the abdominal aorta without aneurysm. There is no gastrohepatic or hepatoduodenal ligament lymphadenopathy. No retroperitoneal or mesenteric lymphadenopathy. No pelvic sidewall lymphadenopathy. Reproductive: Fiducial markers are noted in the region of the cervix multilocular structure in the posterior right adnexal space into the cul-de-sac has been mildly progressive since 11/27/2017. This has a somewhat tubular configuration and could represent a dilated fallopian tube. Ill-defined soft tissue in the region of the cervix, cul-de-sac, and posterior bladder is presumably treatment related. Recurrent disease not excluded. Other: No substantial intraperitoneal free fluid. Musculoskeletal: No worrisome lytic or sclerotic osseous  abnormality. Degenerative disc disease noted L4-5 with diffuse osteopenia. IMPRESSION: 1. Interval development of mild bilateral hydroureteronephrosis with mucosal hyperenhancement in the renal pelvis and ureter bilaterally. No evidence for obstructing stone disease. Imaging features may be related to the distended urinary bladder although the mucosal hyperenhancement would not be expected in that setting. Urinary tract infection would be a consideration. Obstruction due to recurrent disease in this patient with a history of endometrial/cervical cancer is not excluded. 2. Interval progression since 2019 of the multilocular structure in the posterior right adnexal space extending into the cul-de-sac. This has a somewhat tubular configuration and could represent a dilated Fallopian tube. Pelvic ultrasound may prove helpful to further evaluate. 3. Ill-defined soft tissue in the region of the cervix, cul-de-sac, and posterior bladder is presumably treatment related. Recurrent disease not excluded. 4. Stable 11 mm right adrenal nodule since 11/27/2017 consistent with benign etiology such as adenoma. 5. Stable 11 mm subpleural right middle lobe nodule since 2016 consistent with benign etiology. 6. Aortic Atherosclerosis (ICD10-I70.0). Electronically Signed   By: Misty Stanley M.D.   On: 02/02/2021 06:05   DG ABD ACUTE 2+V W 1V CHEST  Result Date: 02/02/2021 CLINICAL DATA:  Altered mental status with multiple falls and abdominal pain. EXAM: DG ABDOMEN ACUTE WITH 1 VIEW CHEST  COMPARISON:  None. FINDINGS: There is no evidence of dilated bowel loops or free intraperitoneal air. No radiopaque calculi or other significant radiographic abnormality is seen. Small surgical clips are seen overlying the midline of the mid to lower pelvis. Heart size and mediastinal contours are within normal limits. Both lungs are clear. IMPRESSION: Negative abdominal radiographs.  No acute cardiopulmonary disease. Electronically Signed   By:  Virgina Norfolk M.D.   On: 02/02/2021 04:02        Scheduled Meds:  amLODipine  10 mg Oral Daily   aspirin EC  81 mg Oral Daily   atorvastatin  80 mg Oral q1800   Chlorhexidine Gluconate Cloth  6 each Topical Daily   clopidogrel  75 mg Oral Daily   enoxaparin (LOVENOX) injection  40 mg Subcutaneous Q24H   escitalopram  20 mg Oral Daily   insulin aspart  0-15 Units Subcutaneous TID WC   insulin glargine-yfgn  18 Units Subcutaneous BID   losartan  50 mg Oral BID   nicotine  14 mg Transdermal Daily   rOPINIRole  0.25 mg Oral QHS   Continuous Infusions:  0.9 % NaCl with KCl 40 mEq / L 75 mL/hr at 02/03/21 0833   cefTRIAXone (ROCEPHIN)  IV 1 g (02/03/21 0645)     LOS: 1 day    Time spent: 35 minutes    Lamari Youngers Darleen Crocker, DO [facility]  If 7PM-7AM, please contact night-coverage [email or web address] 02/03/2021, 12:26 PM

## 2021-02-03 NOTE — Progress Notes (Signed)
Initial Nutrition Assessment  DOCUMENTATION CODES:      INTERVENTION:  Ensure Enlive po BID (chocolate or strawberry)   NUTRITION DIAGNOSIS:   Inadequate oral intake related to acute illness as evidenced by per patient/family report (complaining of abdominal pain at admission-).   GOAL:  Patient will meet greater than or equal to 90% of their needs   MONITOR:  Diet advancement, PO intake, Supplement acceptance, Labs  REASON FOR ASSESSMENT:   Malnutrition Screening Tool    ASSESSMENT: Patient is a 70 yo female with DM2, HTN, endometrial cancer and stroke (left side arm weakness). She presents with abdominal pain.   Patient independent with feeding after tray set-up. PO: 50% of breakfast. Patient says appetite is good but she is not happy with liquid diet. She will receive Heart healthy/CHO mod diet at dinner. Patient denies abdominal pain. She doesn't like vanilla Ensure but is willing to try either chocolate or strawberry.   Weight encounters: 59 kg currently and last weight 67.1 kg in November of 2020.   Medications reviewed and include: lipitor, lexapro, insulin. Nicoderm.  Antibiotic-Rocephin   IVF-NS + KCL@75  ml/hr   Labs:  BMP Latest Ref Rng & Units 02/03/2021 02/02/2021 02/02/2021  Glucose 70 - 99 mg/dL 187(H) - 318(H)  BUN 8 - 23 mg/dL 11 - 17  Creatinine 0.44 - 1.00 mg/dL 0.63 0.74 0.84  Sodium 135 - 145 mmol/L 137 - 132(L)  Potassium 3.5 - 5.1 mmol/L 3.1(L) - 2.9(L)  Chloride 98 - 111 mmol/L 108 - 99  CO2 22 - 32 mmol/L 22 - 23  Calcium 8.9 - 10.3 mg/dL 8.3(L) - 8.6(L)     NUTRITION - FOCUSED PHYSICAL EXAM:  Flowsheet Row Most Recent Value  Orbital Region No depletion  Upper Arm Region No depletion  Thoracic and Lumbar Region No depletion  Buccal Region No depletion  Temple Region No depletion  Clavicle Bone Region No depletion  Clavicle and Acromion Bone Region Mild depletion  Scapular Bone Region No depletion  Dorsal Hand No depletion  Patellar  Region No depletion  Anterior Thigh Region No depletion  Posterior Calf Region Mild depletion  Edema (RD Assessment) None  Hair Reviewed  Eyes Reviewed  Mouth Reviewed  Skin Reviewed  Nails Reviewed       Diet Order:   Diet Order             Diet heart healthy/carb modified Room service appropriate? Yes; Fluid consistency: Thin  Diet effective now                   EDUCATION NEEDS:  Education needs have been addressed  Skin:  Skin Assessment: Reviewed RN Assessment  Last BM:  11/30  Height:   Ht Readings from Last 1 Encounters:  02/02/21 5\' 4"  (1.626 m)    Weight:   Wt Readings from Last 1 Encounters:  02/02/21 59.3 kg    Ideal Body Weight:   55 kg  BMI:  Body mass index is 22.44 kg/m.  Estimated Nutritional Needs:   Kcal:  1600-1700  Protein:  77-83 gr  Fluid:  1600 ml daily  Colman Cater MS,RD,CSG,LDN Contact: Shea Evans

## 2021-02-03 NOTE — Progress Notes (Signed)
Inpatient Diabetes Program Recommendations  AACE/ADA: New Consensus Statement on Inpatient Glycemic Control (2015)  Target Ranges:  Prepandial:   less than 140 mg/dL      Peak postprandial:   less than 180 mg/dL (1-2 hours)      Critically ill patients:  140 - 180 mg/dL   Lab Results  Component Value Date   GLUCAP 379 (H) 02/03/2021   HGBA1C 8.2 (H) 01/25/2021    Review of Glycemic Control  Latest Reference Range & Units 02/02/21 09:42 02/02/21 12:17 02/02/21 16:02 02/02/21 19:59 02/03/21 07:47  Glucose-Capillary 70 - 99 mg/dL 295 (H) 210 (H) 125 (H) 235 (H) 379 (H)   Diabetes history: DM 2 Outpatient Diabetes medications: NPH 40 units QAM, 20 QPM, Amaryl 1 mg QD  Current orders for Inpatient glycemic control:  Semglee 10 units bid Novolog 0-15 units tid  Inpatient Diabetes Program Recommendations:    -  Increase Semglee to 18 units bid -  Add Novolog hs scale  Thanks,  Tama Headings RN, MSN, BC-ADM Inpatient Diabetes Coordinator Team Pager [phone] (8a-5p)

## 2021-02-03 NOTE — Progress Notes (Signed)
Physical Therapy Treatment Patient Details Name: Cheyenne [name] MRN: [id number] DOB: 08-05-1950 Today's Date: 02/03/2021   History of Present Illness Patient is a 70 y/o female brought in by granddaughter for c/o pt having ams x one week and c/o abdominal pain; pt has had multiple falls; pt is oriented x4    PT Comments    Patient requires min assist to pull to seated EOB. She demonstrates good sitting balance while completing exercises without loss of balance. She transfers to standing and ambulates to chair with min assist for weakness and balance deficits. Patient will benefit from continued skilled physical therapy in hospital and recommended venue below to increase strength, balance, endurance for safe ADLs and gait.    Recommendations for follow up therapy are one component of a multi-disciplinary discharge planning process, led by the attending physician.  Recommendations may be updated based on patient status, additional functional criteria and insurance authorization.  Follow Up Recommendations  Skilled nursing-short term rehab (<3 hours/day)     Assistance Recommended at Discharge Intermittent Supervision/Assistance  Equipment Recommendations  None recommended by PT    Recommendations for Other Services       Precautions / Restrictions Precautions Precautions: Fall Restrictions Weight Bearing Restrictions: No     Mobility  Bed Mobility Overal bed mobility: Needs Assistance Bed Mobility: Rolling;Sidelying to Sit Rolling: Modified independent (Device/Increase time) Sidelying to sit: Min assist       General bed mobility comments: increased time, labored movement    Transfers Overall transfer level: Needs assistance Equipment used: 1 person hand held assist Transfers: Sit to/from Stand;Bed to chair/wheelchair/BSC Sit to Stand: Min assist     Step pivot transfers: Min assist     General transfer comment: unsteady on feet, slow labored movement     Ambulation/Gait Ambulation/Gait assistance: Min assist Gait Distance (Feet): 4 Feet Assistive device: 1 person hand held assist Gait Pattern/deviations: Decreased step length - right;Decreased step length - left;Decreased stride length Gait velocity: decreased     General Gait Details: several slow, labored steps to chair at bedside with HHA   Stairs             Wheelchair Mobility    Modified Rankin (Stroke Patients Only)       Balance Overall balance assessment: Needs assistance Sitting-balance support: Feet supported;No upper extremity supported Sitting balance-Leahy Scale: Good Sitting balance - Comments: seated at EOB   Standing balance support: Reliant on assistive device for balance;Bilateral upper extremity supported;During functional activity Standing balance-Leahy Scale: Fair Standing balance comment: fair/ poor without AD                            Cognition Arousal/Alertness: Awake/alert Behavior During Therapy: WFL for tasks assessed/performed Overall Cognitive Status: Within Functional Limits for tasks assessed                                          Exercises General Exercises - Lower Extremity Long Arc Quad: AROM;Both;10 reps;Seated Hip Flexion/Marching: AROM;Both;10 reps;Seated Toe Raises: AROM;Both;10 reps;Seated Heel Raises: AROM;Both;10 reps;Seated    General Comments        Pertinent Vitals/Pain Pain Assessment: No/denies pain    Home Living                          Prior Function  PT Goals (current goals can now be found in the care plan section) Acute Rehab PT Goals Patient Stated Goal: Return home. PT Goal Formulation: With patient Time For Goal Achievement: 02/16/21 Potential to Achieve Goals: Good Progress towards PT goals: Progressing toward goals    Frequency    Min 3X/week      PT Plan Current plan remains appropriate    Co-evaluation               AM-PAC PT "6 Clicks" Mobility   Outcome Measure  Help needed turning from your back to your side while in a flat bed without using bedrails?: A Little Help needed moving from lying on your back to sitting on the side of a flat bed without using bedrails?: A Little Help needed moving to and from a bed to a chair (including a wheelchair)?: A Little Help needed standing up from a chair using your arms (e.g., wheelchair or bedside chair)?: A Little Help needed to walk in hospital room?: A Lot Help needed climbing 3-5 steps with a railing? : Total 6 Click Score: 15    End of Session   Activity Tolerance: Patient tolerated treatment well Patient left: in chair;with call bell/phone within reach;with chair alarm set Nurse Communication: Mobility status PT Visit Diagnosis: Unsteadiness on feet (R26.81);Other abnormalities of gait and mobility (R26.89);Muscle weakness (generalized) (M62.81)     Time: 4982-6415 PT Time Calculation (min) (ACUTE ONLY): 12 min  Charges:  $Therapeutic Exercise: 8-22 mins                     11:47 AM, 02/03/21 Mearl Latin PT, DPT Physical Therapist at [facility]

## 2021-02-03 NOTE — Care Management Important Message (Signed)
Important Message  Patient Details  Name: Cheyenne [name] MRN: [id number] Date of Birth: [birth year]   Medicare Important Message Given:  Yes (spoke with granddaughter Anderson Malta by telephone, explained letter, gave information)     Tommy Medal 02/03/2021, 4:17 PM

## 2021-02-03 NOTE — TOC Progression Note (Signed)
Transition of Care [facility]) - Progression Note    Patient Details  Name: Cheyenne [name] MRN: [id number] Date of Birth: Apr 19, 1950  Transition [facility] CM/SW Contact  Joaquin Courts, RN Phone Number: 02/03/2021, 3:44 PM  Clinical Narrative:    CM has called granddaughter x2, left VM to provide bed offers, unable to reach her at this time.   Expected Discharge Plan: Skilled Nursing Facility Barriers to Discharge: SNF Pending bed offer  Expected Discharge Plan and Services Expected Discharge Plan: [place]   Discharge Planning Services: CM Consult Post Acute Care Choice: [place] Living arrangements for the past 2 months: Mobile Home                 DME Arranged: N/A                     Social Determinants of Health (SDOH) Interventions    Readmission Risk Interventions Readmission Risk Prevention Plan 02/03/2021 01/25/2021  Medication Screening - Complete  Transportation Screening Complete Complete  HRI or Home Care Consult Complete -  Social Work Consult for [place] Planning/Counseling Complete -  Palliative Care Screening Not Applicable -  Medication Review [employer] photographer) Complete -  Some encounter information is confidential and restricted. Go to Review Flowsheets activity to see all data.  Some recent data might be hidden

## 2021-02-04 ENCOUNTER — Inpatient Hospital Stay (HOSPITAL_COMMUNITY): Payer: Medicare HMO

## 2021-02-04 DIAGNOSIS — N3000 Acute cystitis without hematuria: Secondary | ICD-10-CM | POA: Diagnosis not present

## 2021-02-04 LAB — BASIC METABOLIC PANEL
Anion gap: 6 (ref 5–15)
BUN: 7 mg/dL — ABNORMAL LOW (ref 8–23)
CO2: 23 mmol/L (ref 22–32)
Calcium: 8.4 mg/dL — ABNORMAL LOW (ref 8.9–10.3)
Chloride: 107 mmol/L (ref 98–111)
Creatinine, Ser: 0.5 mg/dL (ref 0.44–1.00)
GFR, Estimated: >60 mL/min (ref >60)
Glucose, Bld: 226 mg/dL — ABNORMAL HIGH (ref 70–99)
Potassium: 3.7 mmol/L (ref 3.5–5.1)
Sodium: 136 mmol/L (ref 135–145)

## 2021-02-04 LAB — MAGNESIUM: Magnesium: 1.7 mg/dL (ref 1.7–2.4)

## 2021-02-04 LAB — GLUCOSE, CAPILLARY
Glucose-Capillary: 145 mg/dL — ABNORMAL HIGH (ref 70–99)
Glucose-Capillary: 217 mg/dL — ABNORMAL HIGH (ref 70–99)
Glucose-Capillary: 269 mg/dL — ABNORMAL HIGH (ref 70–99)

## 2021-02-04 IMAGING — DX DG HIP (WITH OR WITHOUT PELVIS) 2-3V*R*
3 series · 3 of 3 positions shown · non-contrast
Comparison: None.

CLINICAL DATA: sustained a fall earlier today and presents with
right hip pain and swelling.

EXAM:
DG HIP (WITH OR WITHOUT PELVIS) 2-3V RIGHT

[hip ap]
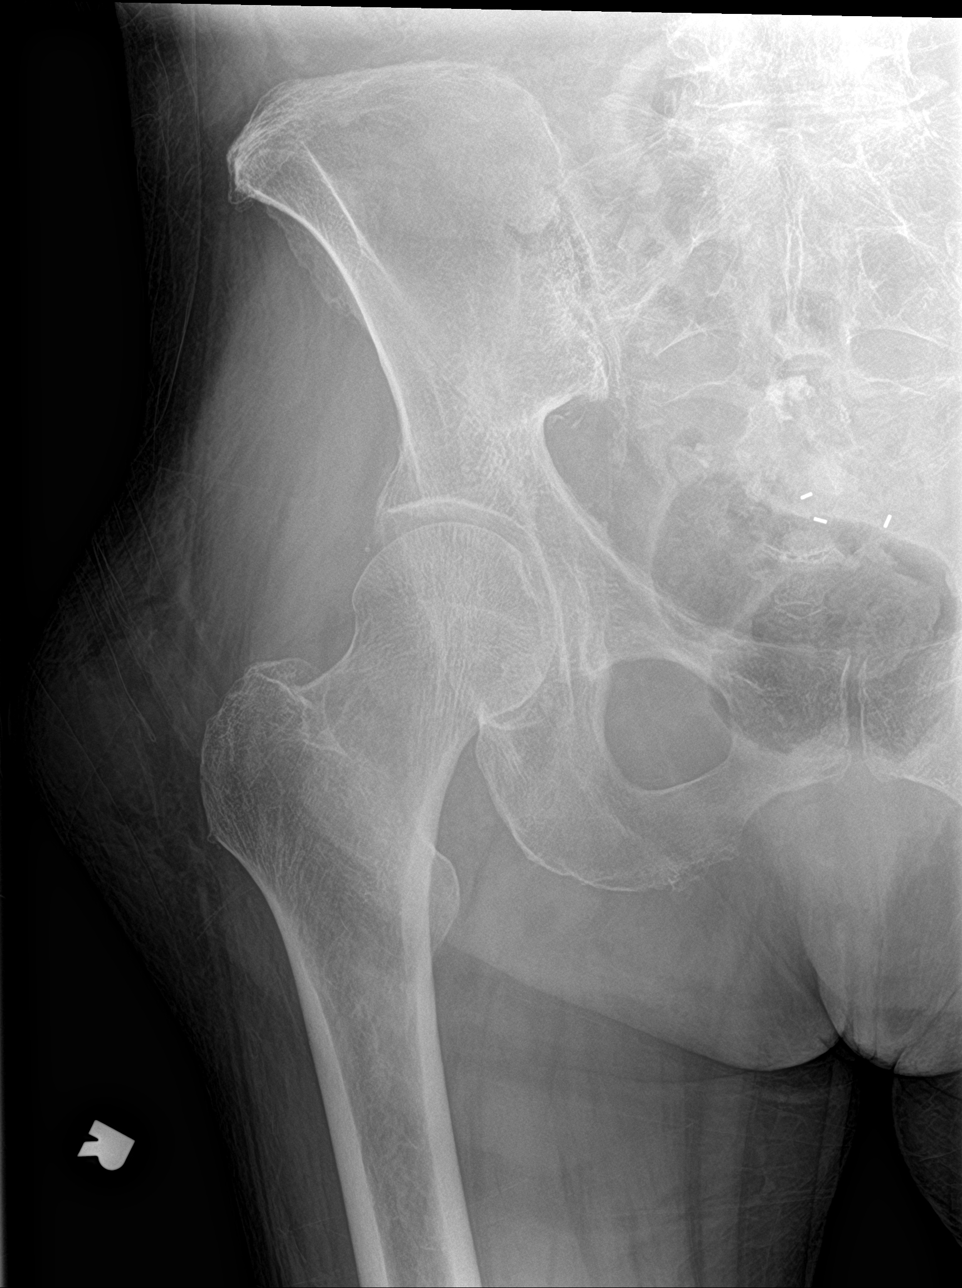

[pelvis ap]
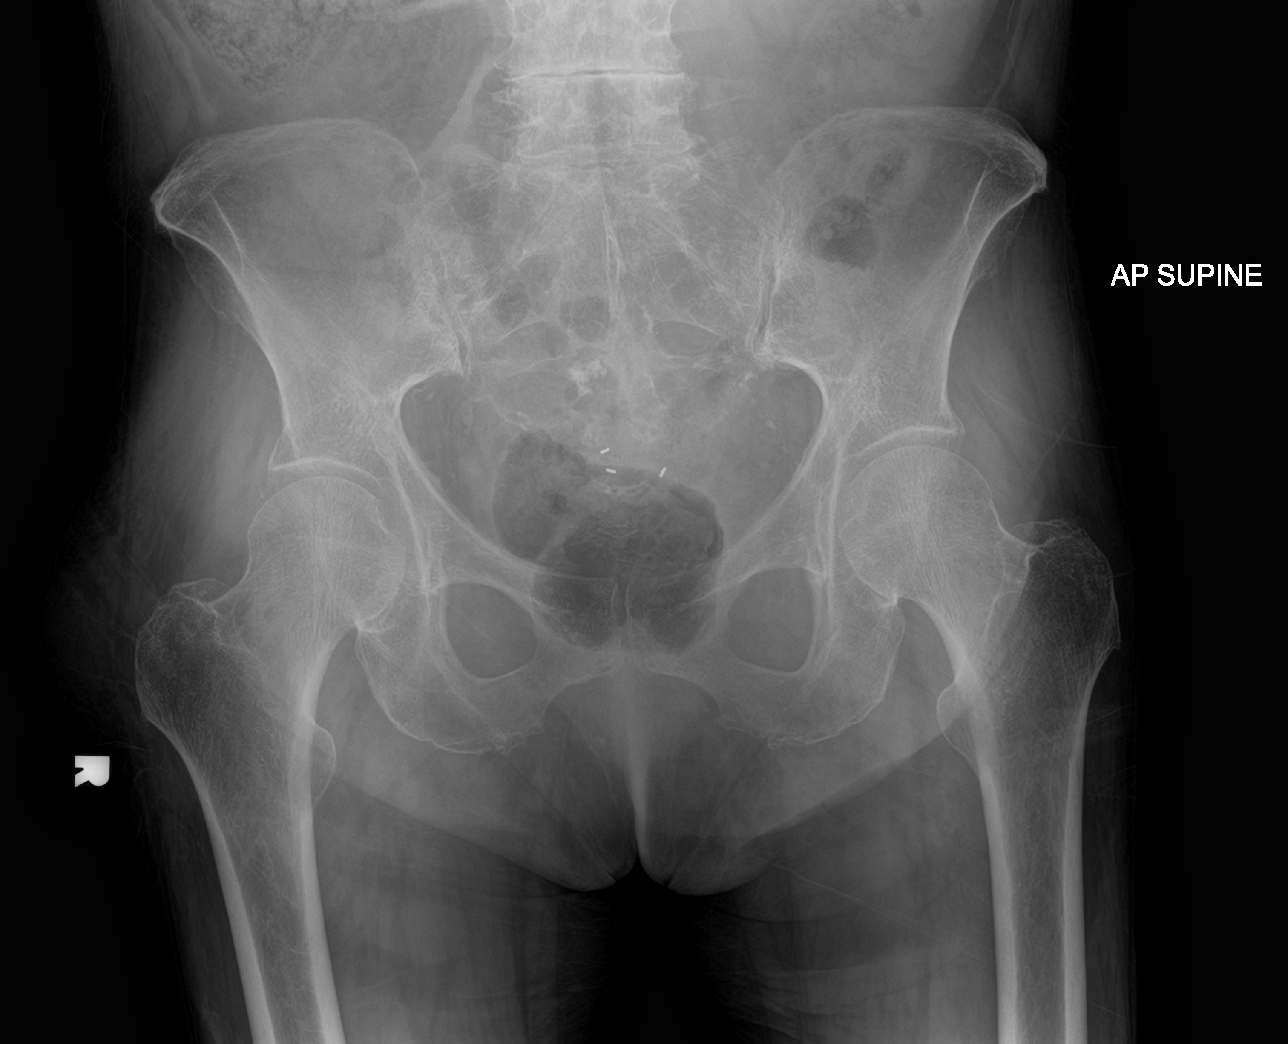

[hip lat]
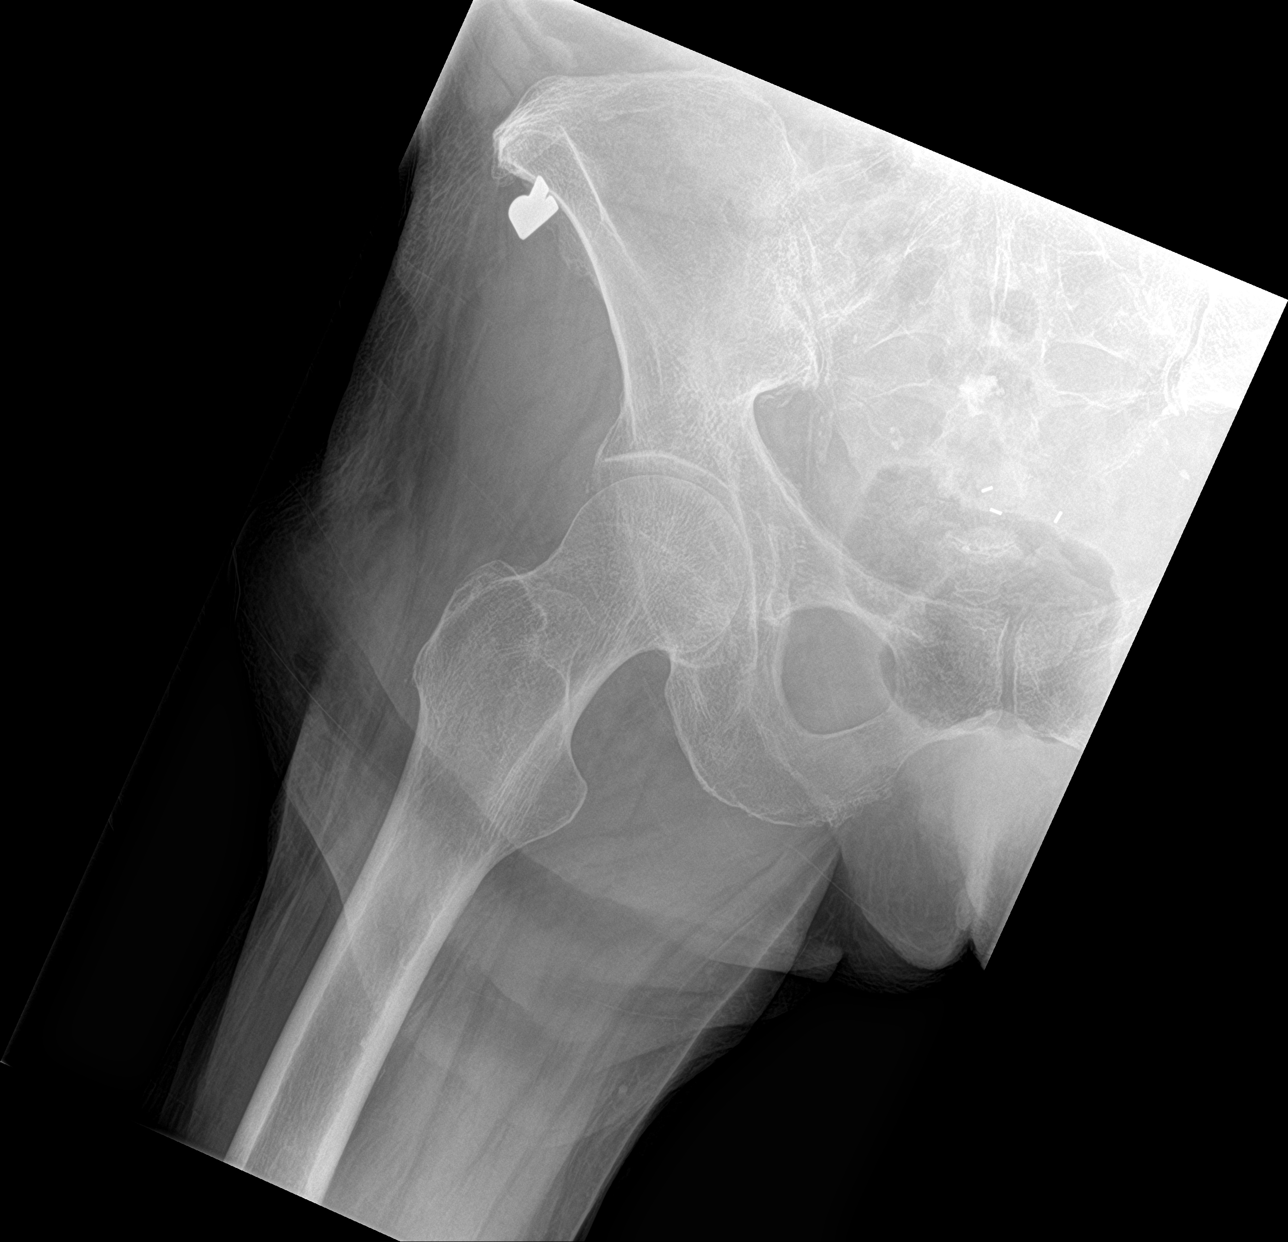

[3 of 3 positions shown; findings below may reference images not displayed]

FINDINGS: There is no evidence of hip fracture or dislocation. There is no
evidence of arthropathy or other focal bone abnormality. 4 cm soft
tissue density possibly representing hematoma with overlying of a
soft tissue edema. Three 3mm linear metallic densities overlying the
pelvis likely surgical. Vascular calcifications.
IMPRESSION: 1. 4 cm soft tissue density possibly representing a hematoma with
overlying soft tissue edema.
2.  No acute displaced fracture or dislocation.

## 2021-02-04 MED ORDER — HYDRALAZINE HCL 20 MG/ML IJ SOLN: 10.0000 mg | INTRAMUSCULAR | Status: DC | PRN

## 2021-02-04 NOTE — Progress Notes (Signed)
During shift report, I was told that patient had a fall today when she was in room 304. That patient didn't have any bruises or marks. That she was moved to 314 because she is a high fall risk. However,  during my shift assessment I noticed that patient R hip is swollen and the bone seems to be displaced. I have concerns that it may be broken. Patient is reporting no pain at this time. Patient has full sensation in R lower extremity. MD Josephine Cables made aware. Will continue to monitor.

## 2021-02-04 NOTE — Progress Notes (Signed)
Tech found pt in the floor at 1615 on her back. Pt stated, "I slid in the floor." Assessed pt. Pt has no bruising or marks to back or body. Will continue to monitor pt.

## 2021-02-04 NOTE — Progress Notes (Signed)
PROGRESS NOTE    Cheyenne [name]  TML:[id number] DOB: 1950/05/08 DOA: 02/02/2021 PCP: Toni Arthurs, PA   Brief Narrative:   Cheyenne [name] is a 70 y.o. female with medical history significant of type 2 diabetes mellitus, hyperlipidemia, hypertension, prior history of endometrial cancer, peripheral neuropathy and recent hospitalization secondary to a stroke with residual left-sided deficits; who presented to the hospital secondary to abdominal pain, fever, nausea/vomiting and diarrhea.  She was admitted with UTI, and has been started on empiric IV antibiotics however cultures have not been obtained.  She is overall feeling much better.  Ultrasound abdomen with ongoing hydronephrosis noted that may be related to urinary retention issues for which she has a Foley catheter.  Awaiting SNF bed.  Assessment & Plan:   Principal Problem:   UTI (urinary tract infection)   1-abdominal pain/flank pain: With concerns for UTI -Continue empiric IV antibiotics with Rocephin and DC by am -Unfortunately urine cultures have not been obtained -Hold further IV fluid -Continue as needed antiemetics and analgesics -Follow response. -Ultrasound on 12/2 with ongoing hydronephrosis and findings of adenomyomatosis of the gallbladder -With noted bilateral hydronephrosis in the setting of urinary retention.  Plan to discharge with Foley catheter and urology outpatient follow-up.   2-nausea/vomiting-resolved -As needed antiemetics and as needed Reglan for refractory symptoms has been ordered -Advance diet today   3-hypertension -Continue home antihypertensive agents. -IV prn for elevations   4-history of stroke -Telemetry monitoring while inpatient -Continue risk factor modifications -Physical therapy has been asked to reassess patient.   5-hypokalemia/dehydration in the setting of GI losses and poor oral intake -Continue potassium supplementation and IV fluid   6-type 2 diabetes mellitus -Follow  CBGs continue sliding scale insulin.   7-history of tobacco abuse -Continue nicotine patch   8-history of depression -Continue the use of Lexapro -Suicidal ideation or hallucinations appreciated on exam.     DVT prophylaxis:Lovenox Code Status: Full Family Communication: None at bedside Disposition Plan:  Status is: Inpatient   Remains inpatient appropriate because: Requires IV medications and SNF placement   Consultants:  None   Procedures:  See below  Antimicrobials:  Anti-infectives (From admission, onward)    Start     Dose/Rate Route Frequency Ordered Stop   02/03/21 0600  cefTRIAXone (ROCEPHIN) 1 g in sodium chloride 0.9 % 100 mL IVPB        1 g 200 mL/hr over 30 Minutes Intravenous Every 24 hours 02/02/21 0812     02/02/21 0615  cefTRIAXone (ROCEPHIN) 1 g in sodium chloride 0.9 % 100 mL IVPB        1 g 200 mL/hr over 30 Minutes Intravenous  Once 02/02/21 0610 02/02/21 0801       Subjective: Patient seen and evaluated today with no new acute complaints or concerns. No acute concerns or events noted overnight.  She is having breakfast this morning.  Awaiting SNF placement.  Objective: Vitals:   02/03/21 0518 02/03/21 1404 02/03/21 2114 02/04/21 0549  BP: 134/85 116/79 (!) 155/72 (!) 187/81  Pulse: 100 81 86 83  Resp: 19 16 19 17   Temp: 98.1 F (36.7 C) 98.6 F (37 C) 98.2 F (36.8 C) 99 F (37.2 C)  TempSrc:  Oral  Oral  SpO2: 96% 98% 98% 93%  Weight:      Height:        Intake/Output Summary (Last 24 hours) at 02/04/2021 1126 Last data filed at 02/04/2021 0900 Gross per 24 hour  Intake 1751.11 ml  Output 4000 ml  Net -2248.89 ml   Filed Weights   02/02/21 0302  Weight: 59.3 kg    Examination:  General exam: Appears calm and comfortable  Respiratory system: Clear to auscultation. Respiratory effort normal. Cardiovascular system: S1 & S2 heard, RRR.  Gastrointestinal system: Abdomen is soft Central nervous system: Alert and  awake Extremities: No edema Skin: No significant lesions noted Psychiatry: Flat affect. Only with clear, yellow urine output    Data Reviewed: I have personally reviewed following labs and imaging studies  CBC: Recent Labs  Lab 02/02/21 0312 02/02/21 0845 02/03/21 0442  WBC 5.0 4.6 4.5  NEUTROABS 3.8  --   --   HGB 13.4 12.8 12.6  HCT 40.1 38.4 38.8  MCV 84.4 85.1 85.8  PLT 281 280 119   Basic Metabolic Panel: Recent Labs  Lab 02/02/21 0312 02/02/21 0845 02/03/21 0442 02/04/21 0352  NA 132*  --  137 136  K 2.9*  --  3.1* 3.7  CL 99  --  108 107  CO2 23  --  22 23  GLUCOSE 318*  --  187* 226*  BUN 17  --  11 7*  CREATININE 0.84 0.74 0.63 0.50  CALCIUM 8.6*  --  8.3* 8.4*  MG  --  1.8 1.9 1.7  PHOS  --  3.4  --   --    GFR: Estimated Creatinine Clearance: 57.3 mL/min (by C-G formula based on SCr of 0.5 mg/dL). Liver Function Tests: Recent Labs  Lab 02/02/21 0312  AST 9*  ALT 11  ALKPHOS 79  BILITOT 0.5  PROT 6.9  ALBUMIN 3.3*   Recent Labs  Lab 02/02/21 0312  LIPASE 23   Recent Labs  Lab 02/02/21 0312  AMMONIA 14   Coagulation Profile: No results for input(s): INR, PROTIME in the last 168 hours. Cardiac Enzymes: No results for input(s): CKTOTAL, CKMB, CKMBINDEX, TROPONINI in the last 168 hours. BNP (last 3 results) No results for input(s): PROBNP in the last 8760 hours. HbA1C: No results for input(s): HGBA1C in the last 72 hours. CBG: Recent Labs  Lab 02/02/21 1959 02/03/21 0747 02/03/21 1138 02/03/21 1559 02/03/21 2116  GLUCAP 235* 379* 181* 128* 328*   Lipid Profile: No results for input(s): CHOL, HDL, LDLCALC, TRIG, CHOLHDL, LDLDIRECT in the last 72 hours. Thyroid Function Tests: Recent Labs    02/02/21 0312  TSH 0.764   Anemia Panel: No results for input(s): VITAMINB12, FOLATE, FERRITIN, TIBC, IRON, RETICCTPCT in the last 72 hours. Sepsis Labs: Recent Labs  Lab 02/02/21 0359  LATICACIDVEN 1.1    Recent Results (from  the past 240 hour(s))  Resp Panel by RT-PCR (Flu A&B, Covid) Nasopharyngeal Swab     Status: None   Collection Time: 02/02/21  4:10 AM   Specimen: Nasopharyngeal Swab; Nasopharyngeal(NP) swabs in vial transport medium  Result Value Ref Range Status   SARS Coronavirus 2 by RT PCR NEGATIVE NEGATIVE Final    Comment: (NOTE) SARS-CoV-2 target nucleic acids are NOT DETECTED.  The SARS-CoV-2 RNA is generally detectable in upper respiratory specimens during the acute phase of infection. The lowest concentration of SARS-CoV-2 viral copies this assay can detect is 138 copies/mL. A negative result does not preclude SARS-Cov-2 infection and should not be used as the sole basis for treatment or other patient management decisions. A negative result may occur with  improper specimen collection/handling, submission of specimen other than nasopharyngeal swab, presence of viral mutation(s) within the areas targeted by this assay, and inadequate  number of viral copies(<138 copies/mL). A negative result must be combined with clinical observations, patient history, and epidemiological information. The expected result is Negative.  Fact Sheet for Patients:  EntrepreneurPulse.com.au  Fact Sheet for Healthcare Providers:  IncredibleEmployment.be  This test is no t yet approved or cleared by the [place] FDA and  has been authorized for detection and/or diagnosis of SARS-CoV-2 by FDA under an Emergency Use Authorization (EUA). This EUA will remain  in effect (meaning this test can be used) for the duration of the COVID-19 declaration under Section 564(b)(1) of the Act, 21 U.S.C.section 360bbb-3(b)(1), unless the authorization is terminated  or revoked sooner.       Influenza A by PCR NEGATIVE NEGATIVE Final   Influenza B by PCR NEGATIVE NEGATIVE Final    Comment: (NOTE) The Xpert Xpress SARS-CoV-2/FLU/RSV plus assay is intended as an aid in the diagnosis of  influenza from Nasopharyngeal swab specimens and should not be used as a sole basis for treatment. Nasal washings and aspirates are unacceptable for Xpert Xpress SARS-CoV-2/FLU/RSV testing.  Fact Sheet for Patients: EntrepreneurPulse.com.au  Fact Sheet for Healthcare Providers: IncredibleEmployment.be  This test is not yet approved or cleared by the [place] FDA and has been authorized for detection and/or diagnosis of SARS-CoV-2 by FDA under an Emergency Use Authorization (EUA). This EUA will remain in effect (meaning this test can be used) for the duration of the COVID-19 declaration under Section 564(b)(1) of the Act, 21 U.S.C. section 360bbb-3(b)(1), unless the authorization is terminated or revoked.  Performed at [facility], [address] [place], [place] [address]          Radiology Studies: US Abdomen Complete  Result Date: 02/03/2021 CLINICAL DATA:  Abdominal pain. EXAM: ABDOMEN ULTRASOUND COMPLETE COMPARISON:  Abdomen/pelvis CT 1 day prior. Ultrasound exam 03/21/2014 FINDINGS: Gallbladder: No gallstones or gallbladder wall thickening. No pericholecystic fluid. The sonographer reports no sonographic Murphy's sign. 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 Sonographer reports no sonographic Murphy sign. Gallbladder wall thickness borderline to mildly increased at 3-4 mm. Common bile duct: Diameter: 4 mm Liver: Mildly increased echogenicity without focal mass lesion evident. Portal vein is patent on color Doppler imaging with normal direction of blood flow towards the liver. IVC: No abnormality visualized. Pancreas: Visualized portion unremarkable. Spleen: Size and appearance within normal limits. Right Kidney: Length: 11.0 cm. Echogenicity within normal limits. Mild hydronephrosis visualized. Left Kidney: Length: 10.9 cm. Echogenicity within normal  limits. Mild hydronephrosis visualized. Abdominal aorta: No aneurysm visualized. Other findings: None. IMPRESSION: 1. Mild bilateral hydronephrosis. Similar findings noted on yesterday's CT. 2. 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 No gallstones evident but gallbladder wall thickness is upper normal to mildly increased. If there is clinical concern for cystic duct obstruction, nuclear scintigraphy could be used to further evaluate. Electronically Signed   By: Misty Stanley M.D.   On: 02/03/2021 09:47        Scheduled Meds:  amLODipine  10 mg Oral Daily   aspirin EC  81 mg Oral Daily   atorvastatin  80 mg Oral q1800   Chlorhexidine Gluconate Cloth  6 each Topical Daily   clopidogrel  75 mg Oral Daily   enoxaparin (LOVENOX) injection  40 mg Subcutaneous Q24H   escitalopram  20 mg Oral Daily   insulin aspart  0-15 Units Subcutaneous TID WC   insulin glargine-yfgn  18  Units Subcutaneous BID   losartan  50 mg Oral BID   nicotine  14 mg Transdermal Daily   rOPINIRole  0.25 mg Oral QHS   Continuous Infusions:  cefTRIAXone (ROCEPHIN)  IV 1 g (02/04/21 0523)     LOS: 2 days    Time spent: 35 minutes    Desia Saban Darleen Crocker, DO [facility]  If 7PM-7AM, please contact night-coverage [email or web address] 02/04/2021, 11:26 AM

## 2021-02-04 NOTE — Progress Notes (Signed)
RN called due to patient's having right hip swelling status post unwitnessed fall this afternoon.  It was unknown if patient hit her head during the fall.  CT of head without contrast and right hip x-ray ordered.  CT of head without contrast showed no acute intracranial abnormality. Right hip x-ray showed 4 cm soft tissue density possibly representing a hematoma with overlying soft tissue edema.  No acute displaced fracture or dislocation. At bedside, hematoma with overlying soft tissue noted.  Area of hematoma will be marked and we shall monitor for increasing size of hematoma and consideration for surgical consult. Lovenox will be temporarily held due to the hematoma Continue fall precaution and neurochecks

## 2021-02-05 DIAGNOSIS — N3000 Acute cystitis without hematuria: Secondary | ICD-10-CM | POA: Diagnosis not present

## 2021-02-05 LAB — GLUCOSE, CAPILLARY
Glucose-Capillary: 128 mg/dL — ABNORMAL HIGH (ref 70–99)
Glucose-Capillary: 238 mg/dL — ABNORMAL HIGH (ref 70–99)
Glucose-Capillary: 236 mg/dL — ABNORMAL HIGH (ref 70–99)
Glucose-Capillary: 279 mg/dL — ABNORMAL HIGH (ref 70–99)

## 2021-02-05 NOTE — Progress Notes (Signed)
PROGRESS NOTE    Cheyenne [name]  QHU:[id number] DOB: 1950-05-22 DOA: 02/02/2021 PCP: Toni Arthurs, PA   Brief Narrative:   Cheyenne [name] is a 70 y.o. female with medical history significant of type 2 diabetes mellitus, hyperlipidemia, hypertension, prior history of endometrial cancer, peripheral neuropathy and recent hospitalization secondary to a stroke with residual left-sided deficits; who presented to the hospital secondary to abdominal pain, fever, nausea/vomiting and diarrhea.  She was admitted with UTI, and has been started on empiric IV antibiotics however cultures have not been obtained.  She is overall feeling much better.  Ultrasound abdomen with ongoing hydronephrosis noted that may be related to urinary retention issues for which she has a Foley catheter.  Awaiting SNF bed.  Assessment & Plan:   Principal Problem:   UTI (urinary tract infection)   1-abdominal pain/flank pain: With concerns for UTI -Rocephin DCD after 3 day course -Unfortunately urine cultures have not been obtained -Hold further IV fluid -Continue as needed antiemetics and analgesics -Follow response. -Ultrasound on 12/2 with ongoing hydronephrosis and findings of adenomyomatosis of the gallbladder -With noted bilateral hydronephrosis in the setting of urinary retention.  Plan to discharge with Foley catheter and urology outpatient follow-up.   2-nausea/vomiting-resolved -As needed antiemetics and as needed Reglan for refractory symptoms has been ordered -Advance diet today   3-hypertension -Continue home antihypertensive agents. -IV prn for elevations   4-history of stroke -Telemetry monitoring while inpatient -Continue risk factor modifications -Physical therapy has been asked to reassess patient.   5-fall with R hip hematoma -Monitor; no fx noted -CT head negative -Ice pack to hematoma   6-type 2 diabetes mellitus -Follow CBGs continue sliding scale insulin.   7-history of  tobacco abuse -Continue nicotine patch   8-history of depression -Continue the use of Lexapro -No suicidal ideation or hallucinations appreciated on exam.     DVT prophylaxis:Lovenox to SCDs Code Status: Full Family Communication: Tried calling granddaughter 12/4 with no response Disposition Plan:  Status is: Inpatient   Remains inpatient appropriate because: Requires IV medications and SNF placement   Consultants:  None   Procedures:  See below  Antimicrobials:  Anti-infectives (From admission, onward)    Start     Dose/Rate Route Frequency Ordered Stop   02/03/21 0600  cefTRIAXone (ROCEPHIN) 1 g in sodium chloride 0.9 % 100 mL IVPB        1 g 200 mL/hr over 30 Minutes Intravenous Every 24 hours 02/02/21 0812     02/02/21 0615  cefTRIAXone (ROCEPHIN) 1 g in sodium chloride 0.9 % 100 mL IVPB        1 g 200 mL/hr over 30 Minutes Intravenous  Once 02/02/21 0610 02/02/21 0801       Subjective: Patient seen and evaluated today with no new acute complaints or concerns. Noted to have fall to R hip yesterday resulting in hematoma. No other injuries noted.  Objective: Vitals:   02/04/21 1622 02/04/21 2216 02/05/21 0631 02/05/21 0911  BP: (!) 152/78 (!) 153/73 (!) 152/83 (!) 137/93  Pulse: 98 78 73 69  Resp: 20 18 19    Temp:  98.2 F (36.8 C) 97.9 F (36.6 C)   TempSrc:   Oral   SpO2: 94% 97% 96%   Weight:      Height:        Intake/Output Summary (Last 24 hours) at 02/05/2021 1009 Last data filed at 02/05/2021 0500 Gross per 24 hour  Intake 240 ml  Output 2850 ml  Net -2610 ml   Filed Weights   02/02/21 0302  Weight: 59.3 kg    Examination:  General exam: Appears calm and comfortable  Respiratory system: Clear to auscultation. Respiratory effort normal. Cardiovascular system: S1 & S2 heard, RRR.  Gastrointestinal system: Abdomen is soft Central nervous system: Alert and awake Extremities: No edema, R sided hip hematoma noted Skin: No significant lesions  noted Psychiatry: Flat affect.    Data Reviewed: I have personally reviewed following labs and imaging studies  CBC: Recent Labs  Lab 02/02/21 0312 02/02/21 0845 02/03/21 0442  WBC 5.0 4.6 4.5  NEUTROABS 3.8  --   --   HGB 13.4 12.8 12.6  HCT 40.1 38.4 38.8  MCV 84.4 85.1 85.8  PLT 281 280 829   Basic Metabolic Panel: Recent Labs  Lab 02/02/21 0312 02/02/21 0845 02/03/21 0442 02/04/21 0352  NA 132*  --  137 136  K 2.9*  --  3.1* 3.7  CL 99  --  108 107  CO2 23  --  22 23  GLUCOSE 318*  --  187* 226*  BUN 17  --  11 7*  CREATININE 0.84 0.74 0.63 0.50  CALCIUM 8.6*  --  8.3* 8.4*  MG  --  1.8 1.9 1.7  PHOS  --  3.4  --   --    GFR: Estimated Creatinine Clearance: 57.3 mL/min (by C-G formula based on SCr of 0.5 mg/dL). Liver Function Tests: Recent Labs  Lab 02/02/21 0312  AST 9*  ALT 11  ALKPHOS 79  BILITOT 0.5  PROT 6.9  ALBUMIN 3.3*   Recent Labs  Lab 02/02/21 0312  LIPASE 23   Recent Labs  Lab 02/02/21 0312  AMMONIA 14   Coagulation Profile: No results for input(s): INR, PROTIME in the last 168 hours. Cardiac Enzymes: No results for input(s): CKTOTAL, CKMB, CKMBINDEX, TROPONINI in the last 168 hours. BNP (last 3 results) No results for input(s): PROBNP in the last 8760 hours. HbA1C: No results for input(s): HGBA1C in the last 72 hours. CBG: Recent Labs  Lab 02/03/21 2116 02/04/21 1217 02/04/21 1704 02/04/21 2212 02/05/21 0756  GLUCAP 328* 145* 217* 269* 128*   Lipid Profile: No results for input(s): CHOL, HDL, LDLCALC, TRIG, CHOLHDL, LDLDIRECT in the last 72 hours. Thyroid Function Tests: No results for input(s): TSH, T4TOTAL, FREET4, T3FREE, THYROIDAB in the last 72 hours. Anemia Panel: No results for input(s): VITAMINB12, FOLATE, FERRITIN, TIBC, IRON, RETICCTPCT in the last 72 hours. Sepsis Labs: Recent Labs  Lab 02/02/21 0359  LATICACIDVEN 1.1    Recent Results (from the past 240 hour(s))  Resp Panel by RT-PCR (Flu A&B,  Covid) Nasopharyngeal Swab     Status: None   Collection Time: 02/02/21  4:10 AM   Specimen: Nasopharyngeal Swab; Nasopharyngeal(NP) swabs in vial transport medium  Result Value Ref Range Status   SARS Coronavirus 2 by RT PCR NEGATIVE NEGATIVE Final    Comment: (NOTE) SARS-CoV-2 target nucleic acids are NOT DETECTED.  The SARS-CoV-2 RNA is generally detectable in upper respiratory specimens during the acute phase of infection. The lowest concentration of SARS-CoV-2 viral copies this assay can detect is 138 copies/mL. A negative result does not preclude SARS-Cov-2 infection and should not be used as the sole basis for treatment or other patient management decisions. A negative result may occur with  improper specimen collection/handling, submission of specimen other than nasopharyngeal swab, presence of viral mutation(s) within the areas targeted by this assay, and inadequate number of viral  copies(<138 copies/mL). A negative result must be combined with clinical observations, patient history, and epidemiological information. The expected result is Negative.  Fact Sheet for Patients:  EntrepreneurPulse.com.au  Fact Sheet for Healthcare Providers:  IncredibleEmployment.be  This test is no t yet approved or cleared by the [place] FDA and  has been authorized for detection and/or diagnosis of SARS-CoV-2 by FDA under an Emergency Use Authorization (EUA). This EUA will remain  in effect (meaning this test can be used) for the duration of the COVID-19 declaration under Section 564(b)(1) of the Act, 21 U.S.C.section 360bbb-3(b)(1), unless the authorization is terminated  or revoked sooner.       Influenza A by PCR NEGATIVE NEGATIVE Final   Influenza B by PCR NEGATIVE NEGATIVE Final    Comment: (NOTE) The Xpert Xpress SARS-CoV-2/FLU/RSV plus assay is intended as an aid in the diagnosis of influenza from Nasopharyngeal swab specimens and should  not be used as a sole basis for treatment. Nasal washings and aspirates are unacceptable for Xpert Xpress SARS-CoV-2/FLU/RSV testing.  Fact Sheet for Patients: EntrepreneurPulse.com.au  Fact Sheet for Healthcare Providers: IncredibleEmployment.be  This test is not yet approved or cleared by the [place] FDA and has been authorized for detection and/or diagnosis of SARS-CoV-2 by FDA under an Emergency Use Authorization (EUA). This EUA will remain in effect (meaning this test can be used) for the duration of the COVID-19 declaration under Section 564(b)(1) of the Act, 21 U.S.C. section 360bbb-3(b)(1), unless the authorization is terminated or revoked.  Performed at [facility], [address], [place], [place] [address]          Radiology Studies: CT HEAD WO CONTRAST (5MM)  Result Date: 02/04/2021 CLINICAL DATA:  Fall EXAM: CT HEAD WITHOUT CONTRAST TECHNIQUE: Contiguous axial images were obtained from the base of the skull through the vertex without intravenous contrast. COMPARISON:  None. FINDINGS: Brain: There is no mass, hemorrhage or extra-axial collection. The size and configuration of the ventricles and extra-axial CSF spaces are normal. There is hypoattenuation of the white matter, most commonly indicating chronic small vessel disease. Old small vessel infarct of the right corona radiata. Vascular: No abnormal hyperdensity of the major intracranial arteries or dural venous sinuses. No intracranial atherosclerosis. Skull: The visualized skull base, calvarium and extracranial soft tissues are normal. Sinuses/Orbits: No fluid levels or advanced mucosal thickening of the visualized paranasal sinuses. No mastoid or middle ear effusion. The orbits are normal. IMPRESSION: 1. No acute intracranial abnormality. 2. Old small vessel infarct of the right corona radiata. Electronically Signed   By: Ulyses Jarred M.D.   On: 02/04/2021 21:33   DG HIP  UNILAT WITH PELVIS 2-3 VIEWS RIGHT  Result Date: 02/04/2021 CLINICAL DATA:  sustained a fall earlier today and presents with right hip pain and swelling. EXAM: DG HIP (WITH OR WITHOUT PELVIS) 2-3V RIGHT COMPARISON:  None. FINDINGS: There is no evidence of hip fracture or dislocation. There is no evidence of arthropathy or other focal bone abnormality. 4 cm soft tissue density possibly representing hematoma with overlying of a soft tissue edema. Three 54mm linear metallic densities overlying the pelvis likely surgical. Vascular calcifications. IMPRESSION: 1. 4 cm soft tissue density possibly representing a hematoma with overlying soft tissue edema. 2.  No acute displaced fracture or dislocation. Electronically Signed   By: Iven Finn M.D.   On: 02/04/2021 23:01        Scheduled Meds:  amLODipine  10 mg Oral Daily   aspirin EC  81 mg  Oral Daily   atorvastatin  80 mg Oral q1800   Chlorhexidine Gluconate Cloth  6 each Topical Daily   clopidogrel  75 mg Oral Daily   escitalopram  20 mg Oral Daily   insulin aspart  0-15 Units Subcutaneous TID WC   insulin glargine-yfgn  18 Units Subcutaneous BID   losartan  50 mg Oral BID   nicotine  14 mg Transdermal Daily   rOPINIRole  0.25 mg Oral QHS   Continuous Infusions:  cefTRIAXone (ROCEPHIN)  IV 1 g (02/05/21 1460)     LOS: 3 days    Time spent: 35 minutes    Cheyenne [name] Darleen Crocker, DO [facility]  If 7PM-7AM, please contact night-coverage [email or web address] 02/05/2021, 10:09 AM

## 2021-02-05 NOTE — Progress Notes (Signed)
Spoke to patient granddaughter Anderson Malta this morning regarding patient hip.

## 2021-02-06 DIAGNOSIS — N3000 Acute cystitis without hematuria: Secondary | ICD-10-CM | POA: Diagnosis not present

## 2021-02-06 LAB — CBC
HCT: 38.1 % (ref 36.0–46.0)
Hemoglobin: 12.3 g/dL (ref 12.0–15.0)
MCH: 27.2 pg (ref 26.0–34.0)
MCHC: 32.3 g/dL (ref 30.0–36.0)
MCV: 84.1 fL (ref 80.0–100.0)
Platelets: 311 10*3/uL (ref 150–400)
RBC: 4.53 MIL/uL (ref 3.87–5.11)
RDW: 12.7 % (ref 11.5–15.5)
WBC: 6 10*3/uL (ref 4.0–10.5)
nRBC: 0 % (ref 0.0–0.2)

## 2021-02-06 LAB — BASIC METABOLIC PANEL
Anion gap: 7 (ref 5–15)
BUN: 10 mg/dL (ref 8–23)
CO2: 24 mmol/L (ref 22–32)
Calcium: 8.6 mg/dL — ABNORMAL LOW (ref 8.9–10.3)
Chloride: 105 mmol/L (ref 98–111)
Creatinine, Ser: 0.56 mg/dL (ref 0.44–1.00)
GFR, Estimated: >60 mL/min (ref >60)
Glucose, Bld: 145 mg/dL — ABNORMAL HIGH (ref 70–99)
Potassium: 3.1 mmol/L — ABNORMAL LOW (ref 3.5–5.1)
Sodium: 136 mmol/L (ref 135–145)

## 2021-02-06 LAB — GLUCOSE, CAPILLARY
Glucose-Capillary: 129 mg/dL — ABNORMAL HIGH (ref 70–99)
Glucose-Capillary: 216 mg/dL — ABNORMAL HIGH (ref 70–99)
Glucose-Capillary: 314 mg/dL — ABNORMAL HIGH (ref 70–99)
Glucose-Capillary: 247 mg/dL — ABNORMAL HIGH (ref 70–99)

## 2021-02-06 MED ORDER — POTASSIUM CHLORIDE CRYS ER 20 MEQ PO TBCR
40.0000 meq | EXTENDED_RELEASE_TABLET | Freq: Once | ORAL | Status: AC
  Administered 2021-02-06: 40 meq via ORAL
  Filled 2021-02-06: qty 2

## 2021-02-06 MED ORDER — INSULIN ASPART 100 UNIT/ML IJ SOLN
0.0000 [IU] | Freq: Every day | INTRAMUSCULAR | Status: DC
  Administered 2021-02-06: 2 [IU] via SUBCUTANEOUS

## 2021-02-06 MED ORDER — INSULIN ASPART 100 UNIT/ML IJ SOLN
0.0000 [IU] | Freq: Three times a day (TID) | INTRAMUSCULAR | Status: DC
  Administered 2021-02-06: 3 [IU] via SUBCUTANEOUS
  Administered 2021-02-06: 15 [IU] via SUBCUTANEOUS
  Administered 2021-02-06: 7 [IU] via SUBCUTANEOUS
  Administered 2021-02-07: 11 [IU] via SUBCUTANEOUS
  Administered 2021-02-07: 6 [IU] via SUBCUTANEOUS

## 2021-02-06 NOTE — Progress Notes (Signed)
PROGRESS NOTE    Cheyenne [name]  DGU:[id number] DOB: 05-14-1950 DOA: 02/02/2021 PCP: Toni Arthurs, PA   Brief Narrative:   Cheyenne [name] is a 70 y.o. female with medical history significant of type 2 diabetes mellitus, hyperlipidemia, hypertension, prior history of endometrial cancer, peripheral neuropathy and recent hospitalization secondary to a stroke with residual left-sided deficits; who presented to the hospital secondary to abdominal pain, fever, nausea/vomiting and diarrhea.  She was admitted with UTI, and has been started on empiric IV antibiotics however cultures have not been obtained.  She is overall feeling much better.  Ultrasound abdomen with ongoing hydronephrosis noted that may be related to urinary retention issues for which she has a Foley catheter.  Assessment & Plan:   Principal Problem:   UTI (urinary tract infection)   1-abdominal pain/flank pain: With concerns for UTI-resolved -Rocephin DCD after 3 day course -Unfortunately urine cultures have not been obtained -Hold further IV fluid -Continue as needed antiemetics and analgesics -Follow response. -Ultrasound on 12/2 with ongoing hydronephrosis and findings of adenomyomatosis of the gallbladder -With noted bilateral hydronephrosis in the setting of urinary retention.  Plan to discharge in am to home health with Foley catheter and urology outpatient follow-up.   2-nausea/vomiting-resolved -As needed antiemetics and as needed Reglan for refractory symptoms has been ordered -Advance diet today   3-hypertension -Continue home antihypertensive agents. -IV prn for elevations   4-history of stroke -Telemetry monitoring while inpatient -Continue risk factor modifications -Physical therapy has been asked to reassess patient.   5-fall with R hip hematoma -Monitor; no fx noted -CT head negative -Ice pack to hematoma   6-type 2 diabetes mellitus -Follow CBGs continue sliding scale insulin.    7-history of tobacco abuse -Continue nicotine patch   8-history of depression -Continue the use of Lexapro -No suicidal ideation or hallucinations appreciated on exam.  9-Hypokalemia -Replete and re-evaluate in am     DVT prophylaxis:Lovenox to SCDs Code Status: Full Family Communication: Granddaughter on phone 12/5  Disposition Plan:  Status is: Inpatient   Remains inpatient appropriate because: Requires IV medications    Consultants:  None   Procedures:  See below  Antimicrobials:  Anti-infectives (From admission, onward)    Start     Dose/Rate Route Frequency Ordered Stop   02/03/21 0600  cefTRIAXone (ROCEPHIN) 1 g in sodium chloride 0.9 % 100 mL IVPB  Status:  Discontinued        1 g 200 mL/hr over 30 Minutes Intravenous Every 24 hours 02/02/21 0812 02/05/21 1010   02/02/21 0615  cefTRIAXone (ROCEPHIN) 1 g in sodium chloride 0.9 % 100 mL IVPB        1 g 200 mL/hr over 30 Minutes Intravenous  Once 02/02/21 0610 02/02/21 0801       Subjective: Patient seen and evaluated today with no new acute complaints or concerns. No acute concerns or events noted overnight. She is more awake and alert this am.  Objective: Vitals:   02/05/21 1331 02/05/21 2045 02/06/21 0552 02/06/21 1230  BP: 135/60 (!) 145/79 (!) 151/77 123/78  Pulse: 83 80 78 87  Resp: 18 18 18 18   Temp: 98.2 F (36.8 C) 98.3 F (36.8 C) 99 F (37.2 C) 98 F (36.7 C)  TempSrc: Oral   Oral  SpO2: 94% 96% 93% 98%  Weight:      Height:        Intake/Output Summary (Last 24 hours) at 02/06/2021 1259 Last data filed at 02/06/2021 1000 Gross  per 24 hour  Intake 1880 ml  Output 2450 ml  Net -570 ml   Filed Weights   02/02/21 0302  Weight: 59.3 kg    Examination:  General exam: Appears calm and comfortable  Respiratory system: Clear to auscultation. Respiratory effort normal. Cardiovascular system: S1 & S2 heard, RRR.  Gastrointestinal system: Abdomen is soft Central nervous system: Alert  and awake Extremities: No edema Skin: No significant lesions noted Psychiatry: Flat affect. Foley with clear, yellow UO    Data Reviewed: I have personally reviewed following labs and imaging studies  CBC: Recent Labs  Lab 02/02/21 0312 02/02/21 0845 02/03/21 0442 02/06/21 0349  WBC 5.0 4.6 4.5 6.0  NEUTROABS 3.8  --   --   --   HGB 13.4 12.8 12.6 12.3  HCT 40.1 38.4 38.8 38.1  MCV 84.4 85.1 85.8 84.1  PLT 281 280 254 222   Basic Metabolic Panel: Recent Labs  Lab 02/02/21 0312 02/02/21 0845 02/03/21 0442 02/04/21 0352 02/06/21 0349  NA 132*  --  137 136 136  K 2.9*  --  3.1* 3.7 3.1*  CL 99  --  108 107 105  CO2 23  --  22 23 24   GLUCOSE 318*  --  187* 226* 145*  BUN 17  --  11 7* 10  CREATININE 0.84 0.74 0.63 0.50 0.56  CALCIUM 8.6*  --  8.3* 8.4* 8.6*  MG  --  1.8 1.9 1.7  --   PHOS  --  3.4  --   --   --    GFR: Estimated Creatinine Clearance: 57.3 mL/min (by C-G formula based on SCr of 0.56 mg/dL). Liver Function Tests: Recent Labs  Lab 02/02/21 0312  AST 9*  ALT 11  ALKPHOS 79  BILITOT 0.5  PROT 6.9  ALBUMIN 3.3*   Recent Labs  Lab 02/02/21 0312  LIPASE 23   Recent Labs  Lab 02/02/21 0312  AMMONIA 14   Coagulation Profile: No results for input(s): INR, PROTIME in the last 168 hours. Cardiac Enzymes: No results for input(s): CKTOTAL, CKMB, CKMBINDEX, TROPONINI in the last 168 hours. BNP (last 3 results) No results for input(s): PROBNP in the last 8760 hours. HbA1C: No results for input(s): HGBA1C in the last 72 hours. CBG: Recent Labs  Lab 02/05/21 1117 02/05/21 1616 02/05/21 2046 02/06/21 0712 02/06/21 1107  GLUCAP 238* 236* 279* 129* 216*   Lipid Profile: No results for input(s): CHOL, HDL, LDLCALC, TRIG, CHOLHDL, LDLDIRECT in the last 72 hours. Thyroid Function Tests: No results for input(s): TSH, T4TOTAL, FREET4, T3FREE, THYROIDAB in the last 72 hours. Anemia Panel: No results for input(s): VITAMINB12, FOLATE, FERRITIN,  TIBC, IRON, RETICCTPCT in the last 72 hours. Sepsis Labs: Recent Labs  Lab 02/02/21 0359  LATICACIDVEN 1.1    Recent Results (from the past 240 hour(s))  Resp Panel by RT-PCR (Flu A&B, Covid) Nasopharyngeal Swab     Status: None   Collection Time: 02/02/21  4:10 AM   Specimen: Nasopharyngeal Swab; Nasopharyngeal(NP) swabs in vial transport medium  Result Value Ref Range Status   SARS Coronavirus 2 by RT PCR NEGATIVE NEGATIVE Final    Comment: (NOTE) SARS-CoV-2 target nucleic acids are NOT DETECTED.  The SARS-CoV-2 RNA is generally detectable in upper respiratory specimens during the acute phase of infection. The lowest concentration of SARS-CoV-2 viral copies this assay can detect is 138 copies/mL. A negative result does not preclude SARS-Cov-2 infection and should not be used as the sole basis for treatment  or other patient management decisions. A negative result may occur with  improper specimen collection/handling, submission of specimen other than nasopharyngeal swab, presence of viral mutation(s) within the areas targeted by this assay, and inadequate number of viral copies(<138 copies/mL). A negative result must be combined with clinical observations, patient history, and epidemiological information. The expected result is Negative.  Fact Sheet for Patients:  EntrepreneurPulse.com.au  Fact Sheet for Healthcare Providers:  IncredibleEmployment.be  This test is no t yet approved or cleared by the [place] FDA and  has been authorized for detection and/or diagnosis of SARS-CoV-2 by FDA under an Emergency Use Authorization (EUA). This EUA will remain  in effect (meaning this test can be used) for the duration of the COVID-19 declaration under Section 564(b)(1) of the Act, 21 U.S.C.section 360bbb-3(b)(1), unless the authorization is terminated  or revoked sooner.       Influenza A by PCR NEGATIVE NEGATIVE Final   Influenza B by  PCR NEGATIVE NEGATIVE Final    Comment: (NOTE) The Xpert Xpress SARS-CoV-2/FLU/RSV plus assay is intended as an aid in the diagnosis of influenza from Nasopharyngeal swab specimens and should not be used as a sole basis for treatment. Nasal washings and aspirates are unacceptable for Xpert Xpress SARS-CoV-2/FLU/RSV testing.  Fact Sheet for Patients: EntrepreneurPulse.com.au  Fact Sheet for Healthcare Providers: IncredibleEmployment.be  This test is not yet approved or cleared by the [place] FDA and has been authorized for detection and/or diagnosis of SARS-CoV-2 by FDA under an Emergency Use Authorization (EUA). This EUA will remain in effect (meaning this test can be used) for the duration of the COVID-19 declaration under Section 564(b)(1) of the Act, 21 U.S.C. section 360bbb-3(b)(1), unless the authorization is terminated or revoked.  Performed at [facility], [address] [place], [place] [address]          Radiology Studies: CT HEAD WO CONTRAST (5MM)  Result Date: 02/04/2021 CLINICAL DATA:  Fall EXAM: CT HEAD WITHOUT CONTRAST TECHNIQUE: Contiguous axial images were obtained from the base of the skull through the vertex without intravenous contrast. COMPARISON:  None. FINDINGS: Brain: There is no mass, hemorrhage or extra-axial collection. The size and configuration of the ventricles and extra-axial CSF spaces are normal. There is hypoattenuation of the white matter, most commonly indicating chronic small vessel disease. Old small vessel infarct of the right corona radiata. Vascular: No abnormal hyperdensity of the major intracranial arteries or dural venous sinuses. No intracranial atherosclerosis. Skull: The visualized skull base, calvarium and extracranial soft tissues are normal. Sinuses/Orbits: No fluid levels or advanced mucosal thickening of the visualized paranasal sinuses. No mastoid or middle ear effusion. The orbits are  normal. IMPRESSION: 1. No acute intracranial abnormality. 2. Old small vessel infarct of the right corona radiata. Electronically Signed   By: Ulyses Jarred M.D.   On: 02/04/2021 21:33   DG HIP UNILAT WITH PELVIS 2-3 VIEWS RIGHT  Result Date: 02/04/2021 CLINICAL DATA:  sustained a fall earlier today and presents with right hip pain and swelling. EXAM: DG HIP (WITH OR WITHOUT PELVIS) 2-3V RIGHT COMPARISON:  None. FINDINGS: There is no evidence of hip fracture or dislocation. There is no evidence of arthropathy or other focal bone abnormality. 4 cm soft tissue density possibly representing hematoma with overlying of a soft tissue edema. Three 63mm linear metallic densities overlying the pelvis likely surgical. Vascular calcifications. IMPRESSION: 1. 4 cm soft tissue density possibly representing a hematoma with overlying soft tissue edema. 2.  No acute displaced fracture or  dislocation. Electronically Signed   By: Iven Finn M.D.   On: 02/04/2021 23:01        Scheduled Meds:  amLODipine  10 mg Oral Daily   aspirin EC  81 mg Oral Daily   atorvastatin  80 mg Oral q1800   Chlorhexidine Gluconate Cloth  6 each Topical Daily   clopidogrel  75 mg Oral Daily   escitalopram  20 mg Oral Daily   insulin aspart  0-20 Units Subcutaneous TID WC   insulin aspart  0-5 Units Subcutaneous QHS   insulin glargine-yfgn  18 Units Subcutaneous BID   losartan  50 mg Oral BID   nicotine  14 mg Transdermal Daily     LOS: 4 days    Time spent: 35 minutes    Courtnay Petrilla Darleen Crocker, DO [facility]  If 7PM-7AM, please contact night-coverage [email or web address] 02/06/2021, 12:59 PM

## 2021-02-06 NOTE — TOC Progression Note (Signed)
Transition of Care [facility]) - Progression Note    Patient Details  Name: Cheyenne [name] MRN: [id number] Date of Birth: November 26, 1950  [facility] CM/SW Contact  Salome Arnt, [place] Phone Number: 02/06/2021, 1:29 PM  Clinical Narrative: Per PT, pt much improved today and can return home with home health. LCSW discussed with pt's granddaughter who is agreeable. Cane requested and referred to [employer]. Caryl Pina will deliver to room prior to d/c tomorrow. Granddaughter states they have used [facility] in the past, but they do not have an aide for baths. Requesting new home health agency with aide. Marjory Lies with [facility] accepts. Granddaughter updated and reports she can pick up pt tomorrow. TOC will continue to follow.       Expected Discharge Plan: [place] Barriers to Discharge: Continued Medical Work up  Expected Discharge Plan and Services Expected Discharge Plan: [place]   Discharge Planning Services: CM Consult Post Acute Care Choice: [place] Living arrangements for the past 2 months: Mobile Home                 DME Arranged: N/A                     Social Determinants of Health (SDOH) Interventions    Readmission Risk Interventions Readmission Risk Prevention Plan 02/03/2021 01/25/2021  Medication Screening - Complete  Transportation Screening Complete Complete  HRI or Home Care Consult Complete -  Social Work Consult for [place] Planning/Counseling Complete -  Palliative Care Screening Not Applicable -  Medication Review Press photographer) Complete -  Some encounter information is confidential and restricted. Go to Review Flowsheets activity to see all data.  Some recent data might be hidden

## 2021-02-06 NOTE — Progress Notes (Signed)
Physical Therapy Treatment Patient Details Name: Cheyenne [name] MRN: [id number] DOB: Jun 05, 1950 Today's Date: 02/06/2021   History of Present Illness Cheyenne [name] is a 70 y.o. female with medical history significant of type 2 diabetes mellitus, hyperlipidemia, hypertension, prior history of endometrial cancer, peripheral neuropathy and recent hospitalization secondary to a stroke with residual left-sided deficits; who presented to the hospital secondary to abdominal pain, fever, nausea/vomiting and diarrhea.  Symptoms have been present for the last 3 days and worsening.  Patient's granddaughter reported that since she was discharged from the hospital after her stroke she has have a hard time mobilizing herself due to poor balances and experiencing multiple fall events at home.  The abdominal pain is localized mainly in her lower abdomen and bilateral flanks.  They have not been any reports of bleeding, hematuria, melena, hematochezia, hematemesis or complaints of chest pain, shortness of breath, new focal deficits or any other complaints.  (At baseline patient with left Residual weakness).    PT Comments    Patient demonstrates increased endurance/distance for gait training using RW and cane without loss of balance, good return for transfers and getting into/out of bed requiring less assistance.  Patient tolerated sitting up in chair after therapy - nursing staff aware.  Patient will benefit from continued skilled physical therapy in hospital and recommended venue below to increase strength, balance, endurance for safe ADLs and gait.     Recommendations for follow up therapy are one component of a multi-disciplinary discharge planning process, led by the attending physician.  Recommendations may be updated based on patient status, additional functional criteria and insurance authorization.  Follow Up Recommendations  Home health PT     Assistance Recommended at Discharge Intermittent  Supervision/Assistance  Equipment Recommendations  Cane    Recommendations for Other Services       Precautions / Restrictions Precautions Precautions: Fall Restrictions Weight Bearing Restrictions: No     Mobility  Bed Mobility Overal bed mobility: Needs Assistance Bed Mobility: Supine to Sit;Sit to Supine     Supine to sit: Modified independent (Device/Increase time);Supervision Sit to supine: Modified independent (Device/Increase time);Supervision   General bed mobility comments: demonstrates good return for getting into/out of bed    Transfers Overall transfer level: Needs assistance Equipment used: Rolling walker (2 wheels);1 person hand held assist;Straight cane   Sit to Stand: Supervision     Step pivot transfers: Supervision;Min guard     General transfer comment: slightly labored movement requiring less assistance    Ambulation/Gait Ambulation/Gait assistance: Supervision Gait Distance (Feet): 100 Feet Assistive device: Rolling walker (2 wheels);Straight cane Gait Pattern/deviations: Step-to pattern;Decreased stance time - right;Decreased step length - left;Decreased stride length Gait velocity: decreased     General Gait Details: increased endurance/distance for ambulation with slightly labored cadence, no loss of balance and demonstrates good return for using SPC with mostly step-to pattern without loss of balance   Stairs             Wheelchair Mobility    Modified Rankin (Stroke Patients Only)       Balance Overall balance assessment: Needs assistance Sitting-balance support: Feet supported;No upper extremity supported Sitting balance-Leahy Scale: Good Sitting balance - Comments: seated at EOB   Standing balance support: Reliant on assistive device for balance;During functional activity;Single extremity supported Standing balance-Leahy Scale: Fair Standing balance comment: fair/good using SPC, good using RW  Cognition Arousal/Alertness: Awake/alert Behavior During Therapy: WFL for tasks assessed/performed Overall Cognitive Status: Within Functional Limits for tasks assessed                                          Exercises General Exercises - Lower Extremity Long Arc Quad: AROM;Both;10 reps;Seated Hip Flexion/Marching: AROM;Both;10 reps;Seated Toe Raises: AROM;Both;10 reps;Seated Heel Raises: AROM;Both;10 reps;Seated    General Comments        Pertinent Vitals/Pain Pain Assessment: No/denies pain    Home Living                          Prior Function            PT Goals (current goals can now be found in the care plan section) Acute Rehab PT Goals Patient Stated Goal: Return home. PT Goal Formulation: With patient Time For Goal Achievement: 02/08/21 Potential to Achieve Goals: Good Progress towards PT goals: Progressing toward goals    Frequency    Min 3X/week      PT Plan Discharge plan needs to be updated    Co-evaluation              AM-PAC PT "6 Clicks" Mobility   Outcome Measure  Help needed turning from your back to your side while in a flat bed without using bedrails?: None Help needed moving from lying on your back to sitting on the side of a flat bed without using bedrails?: None Help needed moving to and from a bed to a chair (including a wheelchair)?: A Little Help needed standing up from a chair using your arms (e.g., wheelchair or bedside chair)?: None Help needed to walk in hospital room?: A Little Help needed climbing 3-5 steps with a railing? : A Little 6 Click Score: 21    End of Session   Activity Tolerance: Patient tolerated treatment well Patient left: in chair;Other (comment) Nurse Communication: Mobility status PT Visit Diagnosis: Unsteadiness on feet (R26.81);Other abnormalities of gait and mobility (R26.89);Muscle weakness (generalized) (M62.81)     Time: 7824-2353 PT Time  Calculation (min) (ACUTE ONLY): 26 min  Charges:  $Gait Training: 8-22 mins $Therapeutic Exercise: 8-22 mins                     3:40 PM, 02/06/21 Lonell Grandchild, MPT Physical Therapist with [facility] [phone] office [phone] mobile phone

## 2021-02-07 DIAGNOSIS — N3000 Acute cystitis without hematuria: Secondary | ICD-10-CM | POA: Diagnosis not present

## 2021-02-07 LAB — BASIC METABOLIC PANEL
Anion gap: 9 (ref 5–15)
Anion gap: 8 (ref 5–15)
BUN: 13 mg/dL (ref 8–23)
BUN: 11 mg/dL (ref 8–23)
CO2: 21 mmol/L — ABNORMAL LOW (ref 22–32)
CO2: 22 mmol/L (ref 22–32)
Calcium: 8.6 mg/dL — ABNORMAL LOW (ref 8.9–10.3)
Calcium: 8.4 mg/dL — ABNORMAL LOW (ref 8.9–10.3)
Chloride: 104 mmol/L (ref 98–111)
Chloride: 105 mmol/L (ref 98–111)
Creatinine, Ser: 0.69 mg/dL (ref 0.44–1.00)
Creatinine, Ser: 0.65 mg/dL (ref 0.44–1.00)
GFR, Estimated: >60 mL/min (ref >60)
GFR, Estimated: >60 mL/min (ref >60)
Glucose, Bld: 257 mg/dL — ABNORMAL HIGH (ref 70–99)
Glucose, Bld: 297 mg/dL — ABNORMAL HIGH (ref 70–99)
Potassium: 3.8 mmol/L (ref 3.5–5.1)
Potassium: 3.4 mmol/L — ABNORMAL LOW (ref 3.5–5.1)
Sodium: 134 mmol/L — ABNORMAL LOW (ref 135–145)
Sodium: 135 mmol/L (ref 135–145)

## 2021-02-07 LAB — GLUCOSE, CAPILLARY
Glucose-Capillary: 282 mg/dL — ABNORMAL HIGH (ref 70–99)
Glucose-Capillary: 259 mg/dL — ABNORMAL HIGH (ref 70–99)
Glucose-Capillary: >600 mg/dL (ref 70–99)

## 2021-02-07 LAB — MAGNESIUM
Magnesium: 1.8 mg/dL (ref 1.7–2.4)
Magnesium: 1.7 mg/dL (ref 1.7–2.4)

## 2021-02-07 LAB — GLUCOSE, RANDOM: Glucose, Bld: 172 mg/dL — ABNORMAL HIGH (ref 70–99)

## 2021-02-07 MED ORDER — POTASSIUM CHLORIDE CRYS ER 20 MEQ PO TBCR: 40.0000 meq | EXTENDED_RELEASE_TABLET | Freq: Once | ORAL | Status: DC

## 2021-02-07 MED ORDER — DIPHENHYDRAMINE HCL 50 MG/ML IJ SOLN
25.0000 mg | Freq: Once | INTRAMUSCULAR | Status: AC
  Administered 2021-02-07: 25 mg via INTRAVENOUS
  Filled 2021-02-07: qty 1

## 2021-02-07 NOTE — Progress Notes (Signed)
RN called to request for medication due to patient being agitated and trying to get down from the bed.  Chart was reviewed and it was noted that patient was hypokalemic based on BMP done on 12/5, magnesium level was unknown.  These were ordered to be checked.  EKG was ordered so as to know patient's current QT. Unfortunately patient appears to be allergic to benzodiazepine, since patient persistently continued to be agitated and continued to be a risk for fall, 1 dose of Benadryl was given prior to outcome of above studies. Potassium was replenished based on yesterday's lab.

## 2021-02-07 NOTE — Discharge Summary (Signed)
Physician Discharge Summary  Cheyenne [name] WCB:[id number] DOB: 1950/04/29 DOA: 02/02/2021  PCP: Toni Arthurs, PA  Admit date: 02/02/2021  Discharge date: 02/07/2021  Admitted From:Home  Disposition:  Home  Recommendations for Outpatient Follow-up:  Follow up with PCP in 1-2 weeks Continue on home medications as prior with the exception of Requip at bedtime for now Urology follow-up outpatient with Foley catheter that will remain in place due to urinary retention with hydronephrosis issues Completed course of treatment for UTI  Home Health: Yes with PT, RN, aide, CSW  Equipment/Devices: None  Discharge Condition:Stable  CODE STATUS: Full  Diet recommendation: Heart Healthy/carb modified  Brief/Interim Summary:  Cheyenne [name] is a 70 y.o. female with medical history significant of type 2 diabetes mellitus, hyperlipidemia, hypertension, prior history of endometrial cancer, peripheral neuropathy and recent hospitalization secondary to a stroke with residual left-sided deficits; who presented to the hospital secondary to abdominal pain, fever, nausea/vomiting and diarrhea.  She was admitted with UTI with noted urinary retention as well as bilateral hydronephrosis that appears to have been related to her severe urinary retention.  Unfortunately, urine cultures were not obtained, but she has completed course of treatment with Rocephin and is overall improved.  She will continue to require Foley catheter on discharge with close follow-up to urology with referral which has been sent.  She was initially seen by PT with recommendations for SNF, but her weakness and gait eventually improved and she is now stable for transfer to home health with PT.  She was noted to have a fall during the course of this admission with no significant injuries aside from a right-sided hip hematoma for which ice packs are recommended.  No other acute events noted throughout the course of this admission and she  is stable for discharge.  Discharge Diagnoses:  Principal Problem:   UTI (urinary tract infection)  Principal discharge diagnosis: UTI in the setting of urinary retention with bilateral hydronephrosis.  Discharge Instructions  Discharge Instructions     Ambulatory referral to Urology   Complete by: As directed    Diet - low sodium heart healthy   Complete by: As directed    Increase activity slowly   Complete by: As directed       Allergies as of 02/07/2021       Reactions   Clonazepam    Other reaction(s): Other (See Comments) irritability   Gabapentin    Bad dreams   Pregabalin    Drowsiness. Mind not clear.   Codeine Itching        Medication List     STOP taking these medications    rOPINIRole 0.25 MG tablet Commonly known as: REQUIP       TAKE these medications    acetaminophen 325 MG tablet Commonly known as: TYLENOL Take 650 mg by mouth every 6 (six) hours as needed for mild pain.   albuterol 108 (90 Base) MCG/ACT inhaler Commonly known as: VENTOLIN HFA Inhale 1-2 puffs into the lungs every 6 (six) hours as needed for wheezing or shortness of breath.   alendronate 70 MG tablet Commonly known as: FOSAMAX Take 70 mg by mouth once a week. Wednesday   amLODipine 10 MG tablet Commonly known as: NORVASC Take 1 tablet (10 mg total) by mouth daily.   aspirin 81 MG EC tablet Take 1 tablet (81 mg total) by mouth daily. Swallow whole.   atorvastatin 40 MG tablet Commonly known as: LIPITOR Take 80 mg by mouth daily.  cholecalciferol 25 MCG (1000 UNIT) tablet Commonly known as: VITAMIN D3 Take 1,000 Units by mouth daily.   clopidogrel 75 MG tablet Commonly known as: PLAVIX Take 1 tablet (75 mg total) by mouth daily.   escitalopram 20 MG tablet Commonly known as: LEXAPRO Take 20 mg by mouth daily.   glimepiride 1 MG tablet Commonly known as: AMARYL Take 1 mg by mouth daily with breakfast.   insulin NPH Human 100 UNIT/ML  injection Commonly known as: NOVOLIN N Inject 0.13 mLs (13 Units total) into the skin 2 (two) times daily at 8 am and 10 pm. What changed:  how much to take additional instructions   loperamide 2 MG capsule Commonly known as: IMODIUM Take 1 capsule (2 mg total) by mouth 3 (three) times daily as needed for diarrhea or loose stools.   loratadine 10 MG tablet Commonly known as: CLARITIN Take 10 mg by mouth daily.   losartan 50 MG tablet Commonly known as: COZAAR Take 50 mg by mouth 2 (two) times daily.   nicotine 14 mg/24hr patch Commonly known as: NICODERM CQ - dosed in mg/24 hours Place 1 patch (14 mg total) onto the skin daily.   potassium chloride SA 20 MEQ tablet Commonly known as: KLOR-CON M Take 40 mEq by mouth every morning.               Durable Medical Equipment  (From admission, onward)           Start     Ordered   02/06/21 1300  For home use only DME Cane  Once        02/06/21 1259            Follow-up Information     [facility] [place] Follow up.   Specialty: Home Health Services Why: Will contact you to schedule home health visits. Contact information: [place] [id number] [phone]         Toni Arthurs, PA. Schedule an appointment as soon as possible for a visit in 1 week(s).   Specialty: Internal Medicine Contact information: [address] [place] [address] [place] Follow up.   Contact information: [place] [address] [phone]               Allergies  Allergen Reactions   Clonazepam     Other reaction(s): Other (See Comments) irritability   Gabapentin     Bad dreams   Pregabalin     Drowsiness. Mind not clear.   Codeine Itching    Consultations: None   Procedures/Studies: CT ANGIO HEAD NECK W WO CM  Result Date: 01/24/2021 CLINICAL DATA:  Stroke/TIA,  multiple recent falls, confusion EXAM: CT ANGIOGRAPHY HEAD AND NECK TECHNIQUE: Multidetector CT imaging of the head and neck was performed using the standard protocol during bolus administration of intravenous contrast. Multiplanar CT image reconstructions and MIPs were obtained to evaluate the vascular anatomy. Carotid stenosis measurements (when applicable) are obtained utilizing NASCET criteria, using the distal internal carotid diameter as the denominator. CONTRAST:  168mL OMNIPAQUE IOHEXOL 350 MG/ML SOLN COMPARISON:  01/24/2021 CT head and MRI head, 10/30/2020 CT head FINDINGS: CT HEAD FINDINGS For noncontrast findings, please see same day CT head. CTA NECK FINDINGS Aortic arch: Standard branching. Imaged portion shows no evidence of aneurysm or dissection. No significant stenosis of the major arch vessel origins. Right  carotid system: No evidence of dissection, stenosis (50% or greater) or occlusion. Left carotid system: No evidence of dissection, stenosis (50% or greater) or occlusion. Vertebral arteries: Codominant. No evidence of dissection, stenosis (50% or greater) or occlusion. Skeleton: No acute osseous abnormality.  Edentulous. Other neck: Multiple peripherally enhancing lesions in the right thyroid lobe, the largest of which measures up to 1.2 cm (series 9, image 102). Upper chest: Paraseptal and centrilobular emphysema. No focal pulmonary opacity or pleural effusion. Review of the MIP images confirms the above findings CTA HEAD FINDINGS Anterior circulation: Both internal carotid arteries are patent to the termini, with mild calcifications but without stenosis or other abnormality. A1 segments patent. Normal anterior communicating artery. Anterior cerebral arteries are patent to their distal aspects. No M1 stenosis or occlusion. Normal MCA bifurcations. Distal MCA branches perfused and symmetric. Posterior circulation: Vertebral arteries patent to the vertebrobasilar junction without stenosis.  Posterior inferior cerebral arteries patent bilaterally. Basilar patent to its distal aspect. Superior cerebellar arteries patent bilaterally. PCAs perfused to their distal aspects without stenosis. Bilateral posterior communicating arteries are visualized. Venous sinuses: As permitted by contrast timing, patent. Anatomic variants: None significant Review of the MIP images confirms the above findings IMPRESSION: 1. No hemodynamically significant stenosis in the neck. 2. No intracranial large vessel occlusion or significant stenosis. 3. Multiple peripherally enhancing lesions in the right thyroid lobe. If these have not previously been evaluated with ultrasound, an ultrasound of the thyroid is recommended. 4.  Emphysema (ICD10-J43.9). Electronically Signed   By: Merilyn Baba M.D.   On: 01/24/2021 23:52   CT HEAD WO CONTRAST (5MM)  Result Date: 02/04/2021 CLINICAL DATA:  Fall EXAM: CT HEAD WITHOUT CONTRAST TECHNIQUE: Contiguous axial images were obtained from the base of the skull through the vertex without intravenous contrast. COMPARISON:  None. FINDINGS: Brain: There is no mass, hemorrhage or extra-axial collection. The size and configuration of the ventricles and extra-axial CSF spaces are normal. There is hypoattenuation of the white matter, most commonly indicating chronic small vessel disease. Old small vessel infarct of the right corona radiata. Vascular: No abnormal hyperdensity of the major intracranial arteries or dural venous sinuses. No intracranial atherosclerosis. Skull: The visualized skull base, calvarium and extracranial soft tissues are normal. Sinuses/Orbits: No fluid levels or advanced mucosal thickening of the visualized paranasal sinuses. No mastoid or middle ear effusion. The orbits are normal. IMPRESSION: 1. No acute intracranial abnormality. 2. Old small vessel infarct of the right corona radiata. Electronically Signed   By: Ulyses Jarred M.D.   On: 02/04/2021 21:33   CT Head Wo  Contrast  Result Date: 02/02/2021 CLINICAL DATA:  Neuro deficit, stroke suspected, recent CVA. Frequent falls. Altered mental status. Left arm weakness. EXAM: CT HEAD WITHOUT CONTRAST TECHNIQUE: Contiguous axial images were obtained from the base of the skull through the vertex without intravenous contrast. COMPARISON:  MRI brain and head CT both 01/24/2021, with MRI showing new punctate or subacute infarcts in left frontal lobe and old lacunar infarct in the right parietal lobe with surrounding diffusion restriction, CT showing a 7 mm hyperintensity laterally in the left frontal lobe and potential tiny subarachnoid bleed in the right parietal lobe. FINDINGS: Brain: There was previously a 7 mm hyperdensity at the gray-white matter interface laterally in the upper left frontal lobe which is no longer seen as well as suspected trace subarachnoid bleed in the right parietal lobe just above this same level, also no longer seen. The scattered tiny left frontal cortical infarcts noted  on MRI are not well recharacterized with CT. A 1 cm chronic appearing lacunar infarct in the right frontoparietal subcortical white matter is again noted as well as mild atrophy and moderate to severe small vessel disease in the cerebral white matter. Cerebellum and brainstem are unremarkable, as visualized. No new abnormality is seen by CT. No midline shift. Vascular: There are no hyperdense central vessels. Scattered calcific plaque in the carotid siphons is again noted. Skull: Normal. Negative for fracture or focal lesion. Sinuses/Orbits: No acute finding. Other: None. IMPRESSION: No acute intracranial CT findings. Small foci of left frontal and right parietal CT hyperdensity on the CT 9 days ago are not seen today. Right frontoparietal lacunar infarct is redemonstrated as well as moderate to severe small vessel changes. If there is concern for occult infarct, given prior findings on MRI, repeat MRI should be obtained. Electronically  Signed   By: Telford Nab M.D.   On: 02/02/2021 06:02   CT Head Wo Contrast  Result Date: 01/24/2021 CLINICAL DATA:  Transient ischemic attack (TIA). Multiple falls. Argumentative. EXAM: CT HEAD WITHOUT CONTRAST TECHNIQUE: Contiguous axial images were obtained from the base of the skull through the vertex without intravenous contrast. COMPARISON:  CT head 10/30/2020. BRAIN: BRAIN Patchy and confluent areas of decreased attenuation are noted throughout the deep and periventricular white matter of the cerebral hemispheres bilaterally, compatible with chronic microvascular ischemic disease. No evidence of large-territorial acute infarction. No parenchymal hemorrhage. Interval development of a 7 mm hyperdense lesion along the gray-white matter junction of the left frontal lobe. Query another similar finding measuring 2 mm along the right parietal gray-white matter junction. No extra-axial collection. No mass effect or midline shift. No hydrocephalus. Basilar cisterns are patent. Vascular: No hyperdense vessel. Atherosclerotic calcifications are present within the cavernous internal carotid arteries. Skull: No acute fracture or focal lesion. Sinuses/Orbits: Right maxillary mucosal thickening. Paranasal sinuses and mastoid air cells are clear. The orbits are unremarkable. Other: None. IMPRESSION: Interval development of a 7 mm hyperdense lesion along the gray-white matter junction of the left frontal lobe. Query another similar finding measuring 2 mm along the right parietal lobe. Finding concerning for metastases. Electronically Signed   By: Iven Finn M.D.   On: 01/24/2021 17:45   MR ANGIO HEAD WO CONTRAST  Result Date: 01/24/2021 CLINICAL DATA:  Transient ischemic attack (TIA) EXAM: MRI HEAD WITHOUT CONTRAST MRA HEAD WITHOUT CONTRAST TECHNIQUE: Multiplanar, multi-echo pulse sequences of the brain and surrounding structures were acquired without intravenous contrast. Angiographic images of the [place]  [place] were acquired using MRA technique without intravenous contrast. COMPARISON:  August 2022 FINDINGS: MRI HEAD FINDINGS Motion artifact is present. Brain: There are minimal new punctate foci of diffusion hyperintensity in the left frontal lobe. Persistent but smaller area with peripheral diffusion hyperintensity in the posterior right frontal white matter. Additional patchy foci in the more anterior right frontal lobe appear to reflect T2 shine through at this point. There are numerous foci of susceptibility in the cerebrum with relative sparing of central structures reflecting chronic microhemorrhages. Patchy and confluent T2 hyperintensity in the supratentorial white matter is nonspecific but may reflect stable chronic microvascular ischemic changes. Vascular: Major vessel flow voids at the skull base are preserved. Skull and upper cervical spine: Normal marrow signal is preserved. Sinuses/Orbits: Paranasal sinuses are aerated. Orbits are unremarkable. Other: Sella is unremarkable.  Mastoid air cells are clear. MRA HEAD FINDINGS Motion artifact is present. Anterior circulation: Intracranial internal carotid arteries are patent. Anterior and middle  cerebral arteries are patent. Posterior circulation: Intracranial vertebral arteries are patent. Basilar artery is patent. Posterior cerebral arteries are patent. Probable distal left P2 PCA stenosis as was also noted on the prior MRA. IMPRESSION: Motion degraded studies. Minimal new punctate acute or subacute infarcts in the left frontal lobe. Area of rim diffusion hyperintensity in the posterior right frontal white matter remains present. This is unusual given length of time since prior study but has gotten smaller. Possible there has been recurrent ischemia at this location. Numerous chronic microhemorrhages in a pattern suggesting amyloid angiopathy. No new proximal intracranial vessel occlusion. Electronically Signed   By: Macy Mis M.D.   On: 01/24/2021  19:25   MR BRAIN WO CONTRAST  Result Date: 01/24/2021 CLINICAL DATA:  Transient ischemic attack (TIA) EXAM: MRI HEAD WITHOUT CONTRAST MRA HEAD WITHOUT CONTRAST TECHNIQUE: Multiplanar, multi-echo pulse sequences of the brain and surrounding structures were acquired without intravenous contrast. Angiographic images of the Circle of [place] were acquired using MRA technique without intravenous contrast. COMPARISON:  August 2022 FINDINGS: MRI HEAD FINDINGS Motion artifact is present. Brain: There are minimal new punctate foci of diffusion hyperintensity in the left frontal lobe. Persistent but smaller area with peripheral diffusion hyperintensity in the posterior right frontal white matter. Additional patchy foci in the more anterior right frontal lobe appear to reflect T2 shine through at this point. There are numerous foci of susceptibility in the cerebrum with relative sparing of central structures reflecting chronic microhemorrhages. Patchy and confluent T2 hyperintensity in the supratentorial white matter is nonspecific but may reflect stable chronic microvascular ischemic changes. Vascular: Major vessel flow voids at the skull base are preserved. Skull and upper cervical spine: Normal marrow signal is preserved. Sinuses/Orbits: Paranasal sinuses are aerated. Orbits are unremarkable. Other: Sella is unremarkable.  Mastoid air cells are clear. MRA HEAD FINDINGS Motion artifact is present. Anterior circulation: Intracranial internal carotid arteries are patent. Anterior and middle cerebral arteries are patent. Posterior circulation: Intracranial vertebral arteries are patent. Basilar artery is patent. Posterior cerebral arteries are patent. Probable distal left P2 PCA stenosis as was also noted on the prior MRA. IMPRESSION: Motion degraded studies. Minimal new punctate acute or subacute infarcts in the left frontal lobe. Area of rim diffusion hyperintensity in the posterior right frontal white matter remains  present. This is unusual given length of time since prior study but has gotten smaller. Possible there has been recurrent ischemia at this location. Numerous chronic microhemorrhages in a pattern suggesting amyloid angiopathy. No new proximal intracranial vessel occlusion. Electronically Signed   By: Macy Mis M.D.   On: 01/24/2021 19:25   MR BRAIN W CONTRAST  Result Date: 01/25/2021 CLINICAL DATA:  Stroke, follow-up. EXAM: MRI HEAD WITH CONTRAST TECHNIQUE: Multiplanar, multiecho pulse sequences of the brain and surrounding structures were obtained with intravenous contrast. CONTRAST:  89mL GADAVIST GADOBUTROL 1 MMOL/ML IV SOLN COMPARISON:  Noncontrast head MRI 01/24/2021 FINDINGS: There is a 3 mm focus of cortical enhancement in the posterior left frontal lobe which corresponds to one of the foci of diffusion weighted signal abnormality on yesterday's noncontrast MRI. No abnormal intracranial enhancement is identified elsewhere, including associated with the posterior right frontal lesion described on the noncontrast MRI favoring that this reflects an evolving chronic infarct. IMPRESSION: 1. 3 mm focus of enhancement in the posterior left frontal lobe, likely a subacute infarct. 2. No abnormal intracranial enhancement elsewhere. No enhancement associated with a presumed chronic infarct in the posterior right frontal white matter. Electronically Signed  By: Logan Bores M.D.   On: 01/25/2021 17:49   US Abdomen Complete  Result Date: 02/03/2021 CLINICAL DATA:  Abdominal pain. EXAM: ABDOMEN ULTRASOUND COMPLETE COMPARISON:  Abdomen/pelvis CT 1 day prior. Ultrasound exam 03/21/2014 FINDINGS: Gallbladder: No gallstones or gallbladder wall thickening. No pericholecystic fluid. The sonographer reports no sonographic Murphy's sign. 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 Sonographer reports no sonographic Murphy sign.  Gallbladder wall thickness borderline to mildly increased at 3-4 mm. Common bile duct: Diameter: 4 mm Liver: Mildly increased echogenicity without focal mass lesion evident. Portal vein is patent on color Doppler imaging with normal direction of blood flow towards the liver. IVC: No abnormality visualized. Pancreas: Visualized portion unremarkable. Spleen: Size and appearance within normal limits. Right Kidney: Length: 11.0 cm. Echogenicity within normal limits. Mild hydronephrosis visualized. Left Kidney: Length: 10.9 cm. Echogenicity within normal limits. Mild hydronephrosis visualized. Abdominal aorta: No aneurysm visualized. Other findings: None. IMPRESSION: 1. Mild bilateral hydronephrosis. Similar findings noted on yesterday's CT. 2. 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 No gallstones evident but gallbladder wall thickness is upper normal to mildly increased. If there is clinical concern for cystic duct obstruction, nuclear scintigraphy could be used to further evaluate. Electronically Signed   By: Misty Stanley M.D.   On: 02/03/2021 09:47   CT ABDOMEN PELVIS W CONTRAST  Result Date: 02/02/2021 CLINICAL DATA:  Abdominal pain prior appendectomy. EXAM: CT ABDOMEN AND PELVIS WITH CONTRAST TECHNIQUE: Multidetector CT imaging of the abdomen and pelvis was performed using the standard protocol following bolus administration of intravenous contrast. CONTRAST:  53mL OMNIPAQUE IOHEXOL 350 MG/ML SOLN COMPARISON:  [facility] CT abdomen/pelvis exam from 12/23/2020. FINDINGS: Lower chest: 7 mm subpleural right middle lobe nodule on image 4/series 3 is unchanged since abdomen CT 03/21/2014 consistent with benign etiology. Hepatobiliary: No suspicious focal abnormality within the liver parenchyma. There is no evidence for gallstones, gallbladder wall thickening, or pericholecystic fluid. No intrahepatic or extrahepatic biliary  dilation. Pancreas: No focal mass lesion. No dilatation of the main duct. No intraparenchymal cyst. No peripancreatic edema. Spleen: No splenomegaly. No focal mass lesion. Adrenals/Urinary Tract: Left adrenal gland unremarkable. 11 mm right adrenal nodule stable since 11/27/2017 consistent with benign etiology such as adenoma. Mild bilateral hydroureteronephrosis noted, new since 12/23/2020. Mucosal hyperenhancement noted in the renal pelvis and ureter bilaterally. Bladder is distended. No suspicious mass lesion or stone disease in either kidney or ureter. Stomach/Bowel: Stomach is unremarkable. No gastric wall thickening. No evidence of outlet obstruction. Duodenum is normally positioned as is the ligament of Treitz. No small bowel wall thickening. No small bowel dilatation. The terminal ileum is normal. Nonvisualization of the appendix is consistent with the reported history of appendectomy. No gross colonic mass. No colonic wall thickening. Perirectal and presacral edema is similar to prior. Vascular/Lymphatic: There is advanced atherosclerotic calcification of the abdominal aorta without aneurysm. There is no gastrohepatic or hepatoduodenal ligament lymphadenopathy. No retroperitoneal or mesenteric lymphadenopathy. No pelvic sidewall lymphadenopathy. Reproductive: Fiducial markers are noted in the region of the cervix multilocular structure in the posterior right adnexal space into the cul-de-sac has been mildly progressive since 11/27/2017. This has a somewhat tubular configuration and could represent a dilated fallopian tube. Ill-defined soft tissue in the region of the cervix, cul-de-sac, and posterior bladder is presumably treatment related. Recurrent disease not excluded. Other: No substantial intraperitoneal free fluid. Musculoskeletal: No worrisome lytic  or sclerotic osseous abnormality. Degenerative disc disease noted L4-5 with diffuse osteopenia. IMPRESSION: 1. Interval development of mild bilateral  hydroureteronephrosis with mucosal hyperenhancement in the renal pelvis and ureter bilaterally. No evidence for obstructing stone disease. Imaging features may be related to the distended urinary bladder although the mucosal hyperenhancement would not be expected in that setting. Urinary tract infection would be a consideration. Obstruction due to recurrent disease in this patient with a history of endometrial/cervical cancer is not excluded. 2. Interval progression since 2019 of the multilocular structure in the posterior right adnexal space extending into the cul-de-sac. This has a somewhat tubular configuration and could represent a dilated Fallopian tube. Pelvic ultrasound may prove helpful to further evaluate. 3. Ill-defined soft tissue in the region of the cervix, cul-de-sac, and posterior bladder is presumably treatment related. Recurrent disease not excluded. 4. Stable 11 mm right adrenal nodule since 11/27/2017 consistent with benign etiology such as adenoma. 5. Stable 11 mm subpleural right middle lobe nodule since 2016 consistent with benign etiology. 6. Aortic Atherosclerosis (ICD10-I70.0). Electronically Signed   By: Misty Stanley M.D.   On: 02/02/2021 06:05   DG ABD ACUTE 2+V W 1V CHEST  Result Date: 02/02/2021 CLINICAL DATA:  Altered mental status with multiple falls and abdominal pain. EXAM: DG ABDOMEN ACUTE WITH 1 VIEW CHEST COMPARISON:  None. FINDINGS: There is no evidence of dilated bowel loops or free intraperitoneal air. No radiopaque calculi or other significant radiographic abnormality is seen. Small surgical clips are seen overlying the midline of the mid to lower pelvis. Heart size and mediastinal contours are within normal limits. Both lungs are clear. IMPRESSION: Negative abdominal radiographs.  No acute cardiopulmonary disease. Electronically Signed   By: Virgina Norfolk M.D.   On: 02/02/2021 04:02   ECHOCARDIOGRAM COMPLETE BUBBLE STUDY  Result Date: 01/26/2021     ECHOCARDIOGRAM REPORT   Patient Name:   KHARIS LAPENNA Date of Exam: 01/26/2021 Medical Rec #:  [id number]        Height:       64.0 in Accession #:    [id number]       Weight:       130.7 lb Date of Birth:  Nov 18, 1950        BSA:          1.633 m Patient Age:    36 years         BP:           177/79 mmHg Patient Gender: F                HR:           88 bpm. Exam Location:  [facility] Procedure: 2D Echo, Cardiac Doppler, Color Doppler and Saline Contrast Bubble            Study Indications:    Stroke  History:        Patient has prior history of Echocardiogram examinations, most                 recent 10/25/2020. Stroke and COPD; Risk Factors:Hypertension,                 Diabetes and Dyslipidemia. Tobacco Abuse.  Sonographer:    Wenda Low Referring Phys: [id number] [place]  1. Left ventricular ejection fraction, by estimation, is 60 to 65%. The left ventricle has normal function. The left ventricle has no regional wall motion abnormalities. There is mild left ventricular hypertrophy. Indeterminate diastolic filling  due to E-A fusion.  2. Right ventricular systolic function is normal. The right ventricular size is normal. Tricuspid regurgitation signal is inadequate for assessing PA pressure.  3. The mitral valve is normal in structure. Trivial mitral valve regurgitation. No evidence of mitral stenosis.  4. The aortic valve is grossly normal. Aortic valve regurgitation is not visualized. No aortic stenosis is present.  5. The inferior vena cava is normal in size with greater than 50% respiratory variability, suggesting right atrial pressure of 3 mmHg.  6. Agitated saline contrast bubble study was negative, with no evidence of any interatrial shunt. Conclusion(s)/Recommendation(s): No intracardiac source of embolism detected on this transthoracic study. Consider a transesophageal echocardiogram to exclude cardiac source of embolism if clinically indicated. FINDINGS  Left Ventricle: Left  ventricular ejection fraction, by estimation, is 60 to 65%. The left ventricle has normal function. The left ventricle has no regional wall motion abnormalities. The left ventricular internal cavity size was normal in size. There is  mild left ventricular hypertrophy. Indeterminate diastolic filling due to E-A fusion. Right Ventricle: The right ventricular size is normal. No increase in right ventricular wall thickness. Right ventricular systolic function is normal. Tricuspid regurgitation signal is inadequate for assessing PA pressure. Left Atrium: Left atrial size was normal in size. Right Atrium: Right atrial size was normal in size. Pericardium: There is no evidence of pericardial effusion. Mitral Valve: The mitral valve is normal in structure. Trivial mitral valve regurgitation. No evidence of mitral valve stenosis. MV peak gradient, 3.5 mmHg. The mean mitral valve gradient is 1.0 mmHg. Tricuspid Valve: The tricuspid valve is normal in structure. Tricuspid valve regurgitation is trivial. No evidence of tricuspid stenosis. Aortic Valve: The aortic valve is grossly normal. Aortic valve regurgitation is not visualized. No aortic stenosis is present. Aortic valve mean gradient measures 2.0 mmHg. Aortic valve peak gradient measures 4.7 mmHg. Aortic valve area, by VTI measures 2.38 cm. Pulmonic Valve: The pulmonic valve was normal in structure. Pulmonic valve regurgitation is not visualized. No evidence of pulmonic stenosis. Aorta: The aortic root is normal in size and structure. Venous: The inferior vena cava is normal in size with greater than 50% respiratory variability, suggesting right atrial pressure of 3 mmHg. IAS/Shunts: No atrial level shunt detected by color flow Doppler. Agitated saline contrast was given intravenously to evaluate for intracardiac shunting. Agitated saline contrast bubble study was negative, with no evidence of any interatrial shunt.  LEFT VENTRICLE PLAX 2D LVIDd:         4.10 cm      Diastology LVIDs:         2.40 cm     LV e' medial:    5.66 cm/s LV PW:         1.10 cm     LV E/e' medial:  8.0 LV IVS:        1.20 cm     LV e' lateral:   4.90 cm/s LVOT diam:     1.90 cm     LV E/e' lateral: 9.3 LV SV:         51 LV SV Index:   31 LVOT Area:     2.84 cm  LV Volumes (MOD) LV vol d, MOD A2C: 30.3 ml LV vol d, MOD A4C: 40.1 ml LV vol s, MOD A2C: 13.7 ml LV vol s, MOD A4C: 18.9 ml LV SV MOD A2C:     16.6 ml LV SV MOD A4C:     40.1 ml LV SV MOD  BP:      18.3 ml RIGHT VENTRICLE RV Basal diam:  2.35 cm RV Mid diam:    1.90 cm RV S prime:     15.00 cm/s TAPSE (M-mode): 2.0 cm LEFT ATRIUM           Index        RIGHT ATRIUM          Index LA diam:      3.30 cm 2.02 cm/m   RA Area:     9.17 cm LA Vol (A2C): 32.3 ml 19.78 ml/m  RA Volume:   20.40 ml 12.49 ml/m LA Vol (A4C): 47.2 ml 28.90 ml/m  AORTIC VALVE                    PULMONIC VALVE AV Area (Vmax):    2.33 cm     PV Vmax:       0.90 m/s AV Area (Vmean):   2.34 cm     PV Peak grad:  3.3 mmHg AV Area (VTI):     2.38 cm AV Vmax:           108.00 cm/s AV Vmean:          70.500 cm/s AV VTI:            0.213 m AV Peak Grad:      4.7 mmHg AV Mean Grad:      2.0 mmHg LVOT Vmax:         88.80 cm/s LVOT Vmean:        58.100 cm/s LVOT VTI:          0.179 m LVOT/AV VTI ratio: 0.84  AORTA Ao Root diam: 2.70 cm MITRAL VALVE MV Area (PHT): 5.13 cm    SHUNTS MV Area VTI:   3.68 cm    Systemic VTI:  0.18 m MV Peak grad:  3.5 mmHg    Systemic Diam: 1.90 cm MV Mean grad:  1.0 mmHg MV Vmax:       0.93 m/s MV Vmean:      46.5 cm/s MV Decel Time: 148 msec MV E velocity: 45.40 cm/s MV A velocity: 87.80 cm/s MV E/A ratio:  0.52 Cherlynn Kaiser MD Electronically signed by Cherlynn Kaiser MD Signature Date/Time: 01/26/2021/6:02:02 PM    Final    DG HIP UNILAT WITH PELVIS 2-3 VIEWS RIGHT  Result Date: 02/04/2021 CLINICAL DATA:  sustained a fall earlier today and presents with right hip pain and swelling. EXAM: DG HIP (WITH OR WITHOUT PELVIS) 2-3V RIGHT  COMPARISON:  None. FINDINGS: There is no evidence of hip fracture or dislocation. There is no evidence of arthropathy or other focal bone abnormality. 4 cm soft tissue density possibly representing hematoma with overlying of a soft tissue edema. Three 17mm linear metallic densities overlying the pelvis likely surgical. Vascular calcifications. IMPRESSION: 1. 4 cm soft tissue density possibly representing a hematoma with overlying soft tissue edema. 2.  No acute displaced fracture or dislocation. Electronically Signed   By: Iven Finn M.D.   On: 02/04/2021 23:01     Discharge Exam: Vitals:   02/06/21 1919 02/07/21 0608  BP: 133/74 (!) 151/72  Pulse: 88 78  Resp: 18 16  Temp: 98.1 F (36.7 C) 97.8 F (36.6 C)  SpO2: 100% 97%   Vitals:   02/06/21 0552 02/06/21 1230 02/06/21 1919 02/07/21 0608  BP: (!) 151/77 123/78 133/74 (!) 151/72  Pulse: 78 87 88 78  Resp: 18 18 18 16   Temp:  99 F (37.2 C) 98 F (36.7 C) 98.1 F (36.7 C) 97.8 F (36.6 C)  TempSrc:  Oral Oral   SpO2: 93% 98% 100% 97%  Weight:      Height:        General: Pt is alert, awake, not in acute distress Cardiovascular: RRR, S1/S2 +, no rubs, no gallops Respiratory: CTA bilaterally, no wheezing, no rhonchi Abdominal: Soft, NT, ND, bowel sounds + Extremities: no edema, no cyanosis Foley with clear, yellow urine output    The results of significant diagnostics from this hospitalization (including imaging, microbiology, ancillary and laboratory) are listed below for reference.     Microbiology: Recent Results (from the past 240 hour(s))  Resp Panel by RT-PCR (Flu A&B, Covid) Nasopharyngeal Swab     Status: None   Collection Time: 02/02/21  4:10 AM   Specimen: Nasopharyngeal Swab; Nasopharyngeal(NP) swabs in vial transport medium  Result Value Ref Range Status   SARS Coronavirus 2 by RT PCR NEGATIVE NEGATIVE Final    Comment: (NOTE) SARS-CoV-2 target nucleic acids are NOT DETECTED.  The SARS-CoV-2 RNA is  generally detectable in upper respiratory specimens during the acute phase of infection. The lowest concentration of SARS-CoV-2 viral copies this assay can detect is 138 copies/mL. A negative result does not preclude SARS-Cov-2 infection and should not be used as the sole basis for treatment or other patient management decisions. A negative result may occur with  improper specimen collection/handling, submission of specimen other than nasopharyngeal swab, presence of viral mutation(s) within the areas targeted by this assay, and inadequate number of viral copies(<138 copies/mL). A negative result must be combined with clinical observations, patient history, and epidemiological information. The expected result is Negative.  Fact Sheet for Patients:  EntrepreneurPulse.com.au  Fact Sheet for Healthcare Providers:  IncredibleEmployment.be  This test is no t yet approved or cleared by the [place] FDA and  has been authorized for detection and/or diagnosis of SARS-CoV-2 by FDA under an Emergency Use Authorization (EUA). This EUA will remain  in effect (meaning this test can be used) for the duration of the COVID-19 declaration under Section 564(b)(1) of the Act, 21 U.S.C.section 360bbb-3(b)(1), unless the authorization is terminated  or revoked sooner.       Influenza A by PCR NEGATIVE NEGATIVE Final   Influenza B by PCR NEGATIVE NEGATIVE Final    Comment: (NOTE) The Xpert Xpress SARS-CoV-2/FLU/RSV plus assay is intended as an aid in the diagnosis of influenza from Nasopharyngeal swab specimens and should not be used as a sole basis for treatment. Nasal washings and aspirates are unacceptable for Xpert Xpress SARS-CoV-2/FLU/RSV testing.  Fact Sheet for Patients: EntrepreneurPulse.com.au  Fact Sheet for Healthcare Providers: IncredibleEmployment.be  This test is not yet approved or cleared by the [place] FDA and has been authorized for detection and/or diagnosis of SARS-CoV-2 by FDA under an Emergency Use Authorization (EUA). This EUA will remain in effect (meaning this test can be used) for the duration of the COVID-19 declaration under Section 564(b)(1) of the Act, 21 U.S.C. section 360bbb-3(b)(1), unless the authorization is terminated or revoked.  Performed at [facility], [address], [place], [place] [address]      Labs: BNP (last 3 results) No results for input(s): BNP in the last 8760 hours. Basic Metabolic Panel: Recent Labs  Lab 02/02/21 0845 02/03/21 0442 02/04/21 0352 02/06/21 0349 02/07/21 0029 02/07/21 0538  NA  --  137 136 136 134* 135  K  --  3.1* 3.7 3.1*  3.8 3.4*  CL  --  108 107 105 104 105  CO2  --  22 23 24  21* 22  GLUCOSE  --  187* 226* 145* 257* 297*  BUN  --  11 7* 10 13 11   CREATININE 0.74 0.63 0.50 0.56 0.69 0.65  CALCIUM  --  8.3* 8.4* 8.6* 8.6* 8.4*  MG 1.8 1.9 1.7  --  1.8 1.7  PHOS 3.4  --   --   --   --   --    Liver Function Tests: Recent Labs  Lab 02/02/21 0312  AST 9*  ALT 11  ALKPHOS 79  BILITOT 0.5  PROT 6.9  ALBUMIN 3.3*   Recent Labs  Lab 02/02/21 0312  LIPASE 23   Recent Labs  Lab 02/02/21 0312  AMMONIA 14   CBC: Recent Labs  Lab 02/02/21 0312 02/02/21 0845 02/03/21 0442 02/06/21 0349  WBC 5.0 4.6 4.5 6.0  NEUTROABS 3.8  --   --   --   HGB 13.4 12.8 12.6 12.3  HCT 40.1 38.4 38.8 38.1  MCV 84.4 85.1 85.8 84.1  PLT 281 280 254 311   Cardiac Enzymes: No results for input(s): CKTOTAL, CKMB, CKMBINDEX, TROPONINI in the last 168 hours. BNP: Invalid input(s): POCBNP CBG: Recent Labs  Lab 02/06/21 1107 02/06/21 1603 02/06/21 2042 02/07/21 0609 02/07/21 0738  GLUCAP 216* 314* 247* 282* 259*   D-Dimer No results for input(s): DDIMER in the last 72 hours. Hgb A1c No results for input(s): HGBA1C in the last 72 hours. Lipid Profile No results for input(s): CHOL, HDL, LDLCALC, TRIG, CHOLHDL,  LDLDIRECT in the last 72 hours. Thyroid function studies No results for input(s): TSH, T4TOTAL, T3FREE, THYROIDAB in the last 72 hours.  Invalid input(s): FREET3 Anemia work up No results for input(s): VITAMINB12, FOLATE, FERRITIN, TIBC, IRON, RETICCTPCT in the last 72 hours. Urinalysis    Component Value Date/Time   COLORURINE YELLOW 02/02/2021 0921   APPEARANCEUR CLOUDY (A) 02/02/2021 0921   APPEARANCEUR Clear 03/20/2014 2149   LABSPEC 1.010 02/02/2021 0921   LABSPEC 1.005 03/20/2014 2149   PHURINE 6.0 02/02/2021 0921   GLUCOSEU >=500 (A) 02/02/2021 0921   GLUCOSEU Negative 03/20/2014 2149   HGBUR MODERATE (A) 02/02/2021 0921   BILIRUBINUR NEGATIVE 02/02/2021 0921   BILIRUBINUR Negative 03/20/2014 2149   KETONESUR 15 (A) 02/02/2021 0921   PROTEINUR 30 (A) 02/02/2021 0921   NITRITE POSITIVE (A) 02/02/2021 0921   LEUKOCYTESUR SMALL (A) 02/02/2021 0921   LEUKOCYTESUR Negative 03/20/2014 2149   Sepsis Labs Invalid input(s): PROCALCITONIN,  WBC,  LACTICIDVEN Microbiology Recent Results (from the past 240 hour(s))  Resp Panel by RT-PCR (Flu A&B, Covid) Nasopharyngeal Swab     Status: None   Collection Time: 02/02/21  4:10 AM   Specimen: Nasopharyngeal Swab; Nasopharyngeal(NP) swabs in vial transport medium  Result Value Ref Range Status   SARS Coronavirus 2 by RT PCR NEGATIVE NEGATIVE Final    Comment: (NOTE) SARS-CoV-2 target nucleic acids are NOT DETECTED.  The SARS-CoV-2 RNA is generally detectable in upper respiratory specimens during the acute phase of infection. The lowest concentration of SARS-CoV-2 viral copies this assay can detect is 138 copies/mL. A negative result does not preclude SARS-Cov-2 infection and should not be used as the sole basis for treatment or other patient management decisions. A negative result may occur with  improper specimen collection/handling, submission of specimen other than nasopharyngeal swab, presence of viral mutation(s) within  the areas targeted by this assay, and inadequate number  of viral copies(<138 copies/mL). A negative result must be combined with clinical observations, patient history, and epidemiological information. The expected result is Negative.  Fact Sheet for Patients:  EntrepreneurPulse.com.au  Fact Sheet for Healthcare Providers:  IncredibleEmployment.be  This test is no t yet approved or cleared by the [place] FDA and  has been authorized for detection and/or diagnosis of SARS-CoV-2 by FDA under an Emergency Use Authorization (EUA). This EUA will remain  in effect (meaning this test can be used) for the duration of the COVID-19 declaration under Section 564(b)(1) of the Act, 21 U.S.C.section 360bbb-3(b)(1), unless the authorization is terminated  or revoked sooner.       Influenza A by PCR NEGATIVE NEGATIVE Final   Influenza B by PCR NEGATIVE NEGATIVE Final    Comment: (NOTE) The Xpert Xpress SARS-CoV-2/FLU/RSV plus assay is intended as an aid in the diagnosis of influenza from Nasopharyngeal swab specimens and should not be used as a sole basis for treatment. Nasal washings and aspirates are unacceptable for Xpert Xpress SARS-CoV-2/FLU/RSV testing.  Fact Sheet for Patients: EntrepreneurPulse.com.au  Fact Sheet for Healthcare Providers: IncredibleEmployment.be  This test is not yet approved or cleared by the [place] FDA and has been authorized for detection and/or diagnosis of SARS-CoV-2 by FDA under an Emergency Use Authorization (EUA). This EUA will remain in effect (meaning this test can be used) for the duration of the COVID-19 declaration under Section 564(b)(1) of the Act, 21 U.S.C. section 360bbb-3(b)(1), unless the authorization is terminated or revoked.  Performed at [facility], [address] [place], [place] [address]      Time coordinating discharge: 35  minutes  SIGNED:   Rodena Goldmann, DO [facility] 02/07/2021, 9:37 AM  If 7PM-7AM, please contact night-coverage [email or web address]

## 2021-02-07 NOTE — Care Management Important Message (Signed)
Important Message  Patient Details  Name: Cheyenne [name] MRN: [id number] Date of Birth: 31-May-1950   Medicare Important Message Given:  Yes (spoke with daughter Anderson Malta)     Tommy Medal 02/07/2021, 12:15 PM

## 2021-02-07 NOTE — Progress Notes (Signed)
Discharge instructions provided to patient's granddaughter, Lanna Poche. Granddaughter states she can be at hospital around 3 pm to pickup patient to go home. No further questions or concerns.

## 2021-02-07 NOTE — Progress Notes (Signed)
Patients glucose reading >600 on glucometer, patient asymptomatic. MD notified and stat lab draw ordered. Glucose on recheck was 172.

## 2021-02-16 ENCOUNTER — Encounter: Payer: Self-pay | Admitting: *Deleted

## 2021-02-23 ENCOUNTER — Encounter (HOSPITAL_COMMUNITY): Payer: Self-pay | Admitting: Radiology

## 2021-03-06 ENCOUNTER — Encounter (HOSPITAL_COMMUNITY): Payer: Self-pay | Admitting: Radiology

## 2021-06-20 ENCOUNTER — Emergency Department: Payer: Medicare HMO

## 2021-06-20 ENCOUNTER — Other Ambulatory Visit: Payer: Self-pay

## 2021-06-20 ENCOUNTER — Inpatient Hospital Stay
Admission: EM | Admit: 2021-06-20 | Discharge: 2021-06-27 | DRG: 091 | Disposition: A | Discharge status: Skilled Nursing Facility | Payer: Medicare HMO | Attending: Internal Medicine | Admitting: Internal Medicine

## 2021-06-20 DIAGNOSIS — I10 Essential (primary) hypertension: Secondary | ICD-10-CM | POA: Diagnosis present

## 2021-06-20 DIAGNOSIS — I639 Cerebral infarction, unspecified: Secondary | ICD-10-CM | POA: Diagnosis present

## 2021-06-20 DIAGNOSIS — G928 Other toxic encephalopathy: Secondary | ICD-10-CM | POA: Diagnosis not present

## 2021-06-20 DIAGNOSIS — E1169 Type 2 diabetes mellitus with other specified complication: Secondary | ICD-10-CM | POA: Diagnosis present

## 2021-06-20 DIAGNOSIS — Z8542 Personal history of malignant neoplasm of other parts of uterus: Secondary | ICD-10-CM

## 2021-06-20 DIAGNOSIS — R4182 Altered mental status, unspecified: Principal | ICD-10-CM

## 2021-06-20 DIAGNOSIS — Z87891 Personal history of nicotine dependence: Secondary | ICD-10-CM

## 2021-06-20 DIAGNOSIS — Z885 Allergy status to narcotic agent status: Secondary | ICD-10-CM

## 2021-06-20 DIAGNOSIS — I160 Hypertensive urgency: Secondary | ICD-10-CM | POA: Diagnosis present

## 2021-06-20 DIAGNOSIS — R29719 NIHSS score 19: Secondary | ICD-10-CM | POA: Diagnosis present

## 2021-06-20 DIAGNOSIS — Z7984 Long term (current) use of oral hypoglycemic drugs: Secondary | ICD-10-CM

## 2021-06-20 DIAGNOSIS — Z7902 Long term (current) use of antithrombotics/antiplatelets: Secondary | ICD-10-CM

## 2021-06-20 DIAGNOSIS — E1142 Type 2 diabetes mellitus with diabetic polyneuropathy: Secondary | ICD-10-CM | POA: Diagnosis present

## 2021-06-20 DIAGNOSIS — J45909 Unspecified asthma, uncomplicated: Secondary | ICD-10-CM | POA: Diagnosis present

## 2021-06-20 DIAGNOSIS — Z7983 Long term (current) use of bisphosphonates: Secondary | ICD-10-CM

## 2021-06-20 DIAGNOSIS — E782 Mixed hyperlipidemia: Secondary | ICD-10-CM | POA: Diagnosis present

## 2021-06-20 DIAGNOSIS — E1165 Type 2 diabetes mellitus with hyperglycemia: Secondary | ICD-10-CM

## 2021-06-20 DIAGNOSIS — Z79899 Other long term (current) drug therapy: Secondary | ICD-10-CM

## 2021-06-20 DIAGNOSIS — J690 Pneumonitis due to inhalation of food and vomit: Secondary | ICD-10-CM | POA: Diagnosis present

## 2021-06-20 DIAGNOSIS — K529 Noninfective gastroenteritis and colitis, unspecified: Secondary | ICD-10-CM | POA: Diagnosis present

## 2021-06-20 DIAGNOSIS — Z888 Allergy status to other drugs, medicaments and biological substances status: Secondary | ICD-10-CM

## 2021-06-20 DIAGNOSIS — G9341 Metabolic encephalopathy: Secondary | ICD-10-CM | POA: Diagnosis present

## 2021-06-20 DIAGNOSIS — F03918 Unspecified dementia, unspecified severity, with other behavioral disturbance: Secondary | ICD-10-CM | POA: Diagnosis present

## 2021-06-20 DIAGNOSIS — N390 Urinary tract infection, site not specified: Secondary | ICD-10-CM | POA: Diagnosis not present

## 2021-06-20 DIAGNOSIS — Z7982 Long term (current) use of aspirin: Secondary | ICD-10-CM

## 2021-06-20 DIAGNOSIS — I69354 Hemiplegia and hemiparesis following cerebral infarction affecting left non-dominant side: Secondary | ICD-10-CM

## 2021-06-20 DIAGNOSIS — I6389 Other cerebral infarction: Secondary | ICD-10-CM | POA: Diagnosis present

## 2021-06-20 DIAGNOSIS — E854 Organ-limited amyloidosis: Secondary | ICD-10-CM | POA: Diagnosis present

## 2021-06-20 DIAGNOSIS — I680 Cerebral amyloid angiopathy: Secondary | ICD-10-CM | POA: Diagnosis present

## 2021-06-20 DIAGNOSIS — Z794 Long term (current) use of insulin: Secondary | ICD-10-CM

## 2021-06-20 LAB — APTT: aPTT: 28 seconds (ref 24–36)

## 2021-06-20 LAB — COMPREHENSIVE METABOLIC PANEL
ALT: 11 U/L (ref 0–44)
AST: 16 U/L (ref 15–41)
Albumin: 3.7 g/dL (ref 3.5–5.0)
Alkaline Phosphatase: 109 U/L (ref 38–126)
Anion gap: 11 (ref 5–15)
BUN: 14 mg/dL (ref 8–23)
CO2: 21 mmol/L — ABNORMAL LOW (ref 22–32)
Calcium: 9.4 mg/dL (ref 8.9–10.3)
Chloride: 107 mmol/L (ref 98–111)
Creatinine, Ser: 0.85 mg/dL (ref 0.44–1.00)
GFR, Estimated: >60 mL/min (ref >60)
Glucose, Bld: 255 mg/dL — ABNORMAL HIGH (ref 70–99)
Potassium: 4.4 mmol/L (ref 3.5–5.1)
Sodium: 139 mmol/L (ref 135–145)
Total Bilirubin: 0.6 mg/dL (ref 0.3–1.2)
Total Protein: 7.1 g/dL (ref 6.5–8.1)

## 2021-06-20 LAB — URINALYSIS, ROUTINE W REFLEX MICROSCOPIC
Bilirubin Urine: NEGATIVE
Glucose, UA: >=500 mg/dL — AB
Ketones, ur: 20 mg/dL — AB
Nitrite: NEGATIVE
Protein, ur: 100 mg/dL — AB
Specific Gravity, Urine: 1.02 (ref 1.005–1.030)
WBC, UA: >50 WBC/hpf — ABNORMAL HIGH (ref 0–5)
pH: 5 (ref 5.0–8.0)

## 2021-06-20 LAB — CBC WITH DIFFERENTIAL/PLATELET
Abs Immature Granulocytes: 0.01 10*3/uL (ref 0.00–0.07)
Basophils Absolute: 0 10*3/uL (ref 0.0–0.1)
Basophils Relative: 1 %
Eosinophils Absolute: 0 10*3/uL (ref 0.0–0.5)
Eosinophils Relative: 0 %
HCT: 40.5 % (ref 36.0–46.0)
Hemoglobin: 13 g/dL (ref 12.0–15.0)
Immature Granulocytes: 0 %
Lymphocytes Relative: 19 %
Lymphs Abs: 1 10*3/uL (ref 0.7–4.0)
MCH: 27.6 pg (ref 26.0–34.0)
MCHC: 32.1 g/dL (ref 30.0–36.0)
MCV: 86 fL (ref 80.0–100.0)
Monocytes Absolute: 0.3 10*3/uL (ref 0.1–1.0)
Monocytes Relative: 5 %
Neutro Abs: 4.1 10*3/uL (ref 1.7–7.7)
Neutrophils Relative %: 75 %
Platelets: 264 10*3/uL (ref 150–400)
RBC: 4.71 MIL/uL (ref 3.87–5.11)
RDW: 13.4 % (ref 11.5–15.5)
WBC: 5.5 10*3/uL (ref 4.0–10.5)
nRBC: 0 % (ref 0.0–0.2)

## 2021-06-20 LAB — PROTIME-INR
INR: 1 (ref 0.8–1.2)
Prothrombin Time: 13.3 seconds (ref 11.4–15.2)

## 2021-06-20 LAB — BLOOD GAS, VENOUS
Acid-base deficit: 0.2 mmol/L (ref 0.0–2.0)
Bicarbonate: 24.8 mmol/L (ref 20.0–28.0)
O2 Saturation: 55.5 %
Patient temperature: 37
pCO2, Ven: 41 mmHg — ABNORMAL LOW (ref 44–60)
pH, Ven: 7.39 (ref 7.25–7.43)
pO2, Ven: 34 mmHg (ref 32–45)

## 2021-06-20 LAB — TROPONIN I (HIGH SENSITIVITY): Troponin I (High Sensitivity): 8 ng/L (ref <18)

## 2021-06-20 LAB — CBG MONITORING, ED: Glucose-Capillary: 280 mg/dL — ABNORMAL HIGH (ref 70–99)

## 2021-06-20 IMAGING — MR MR HEAD WO/W CM
13 series · 46 of 48 positions shown · IV contrast (7.5ml Gadavist)
Comparison: None.

CLINICAL DATA: Acute neurologic deficit

EXAM:
MRI HEAD WITHOUT AND WITH CONTRAST
TECHNIQUE: Multiplanar, multiecho pulse sequences of the brain and surrounding
structures were obtained without and with intravenous contrast.
CONTRAST:  7.5mL GADAVIST GADOBUTROL 1 MMOL/ML IV SOLN

[Series 5: ax dwi_tracew · axial · 3.0mm · 0.65mm/px · z∈[-119,+32]mm · 4 of 48 slices shown]
[im 1/48]
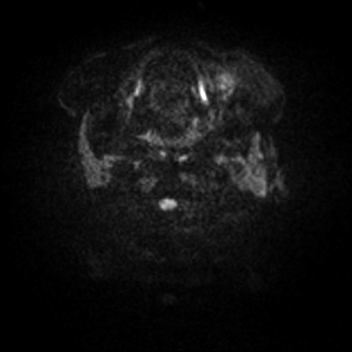
[im 16/48]
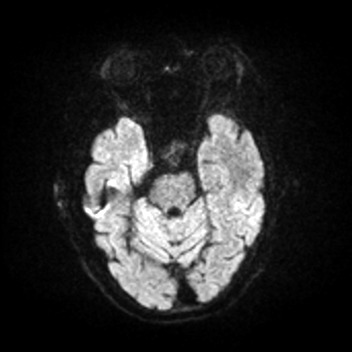
[im 32/48]
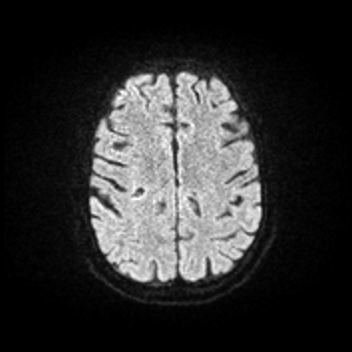
[im 48/48]
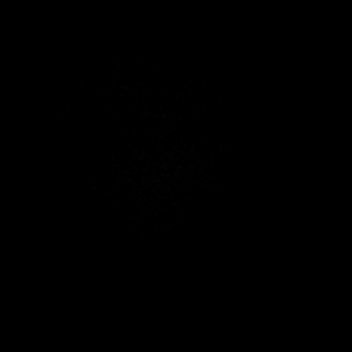

[Series 6: ax dwi_adc · axial · 3.0mm · 0.65mm/px · z∈[-119,+28]mm · 3 of 46 slices shown]
[im 1/46]
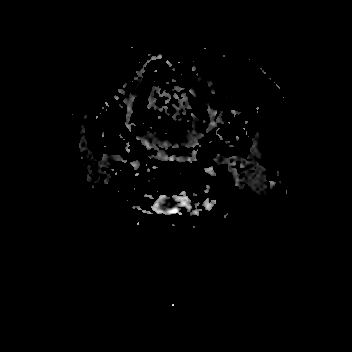
[im 23/46]
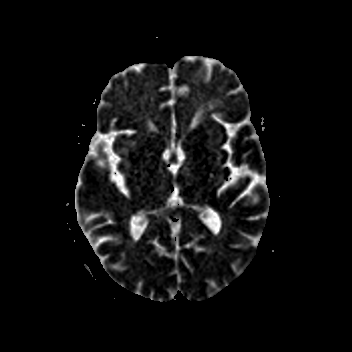
[im 46/46]
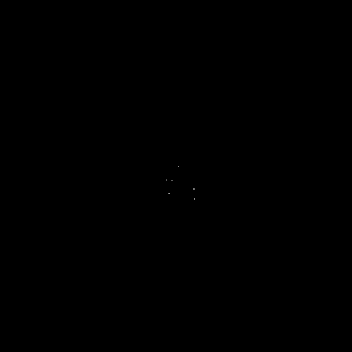

[Series 7: cor dwi_tracew · coronal · 5.0mm · 0.65mm/px · 3 of 40 slices shown]
[im 1/40]
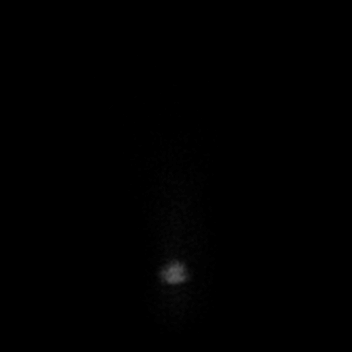
[im 20/40]
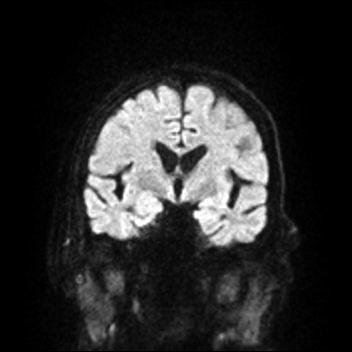
[im 40/40]
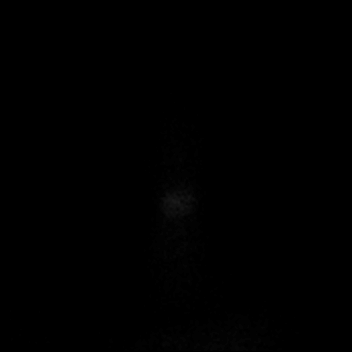

[Series 8: cor dwi_adc · coronal · 5.0mm · 0.65mm/px · 3 of 36 slices shown]
[im 1/36]
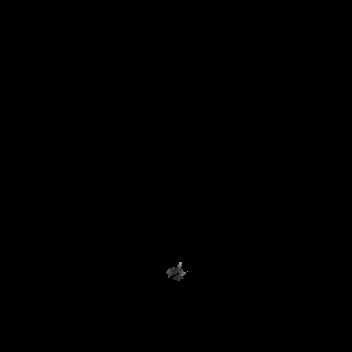
[im 18/36]
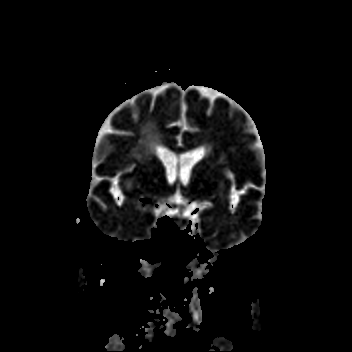
[im 36/36]
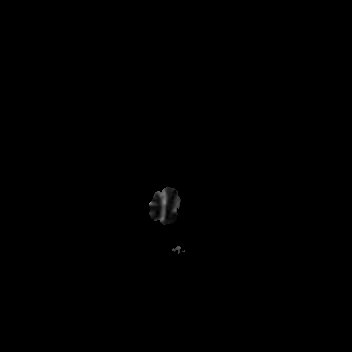

[Series 9: T1 · sagittal · 5.0mm · 0.62mm/px · 2 of 21 slices shown (1 of 2)]
[im 1/21]
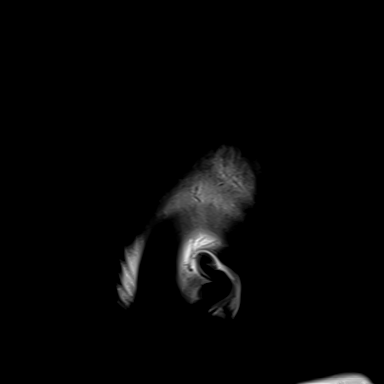
[im 21/21]
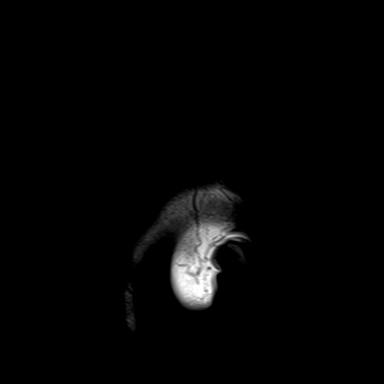

[Series 10: T2 · axial · 5.0mm · 0.53mm/px · z∈[-122,+35]mm · 2 of 28 slices shown (1 of 2)]
[im 1/28]
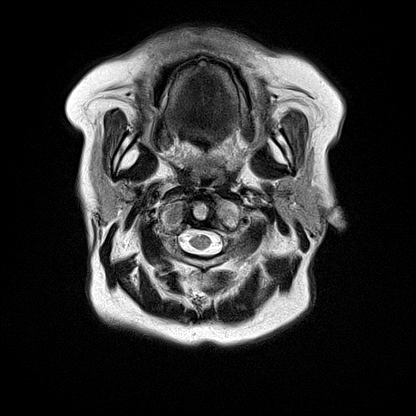
[im 28/28]
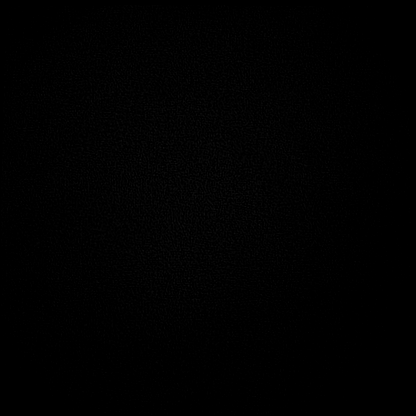

[Series 12: pha_images · axial · 3.0mm · 0.90mm/px · z∈[-132,+31]mm · 4 of 55 slices shown]
[im 1/55]
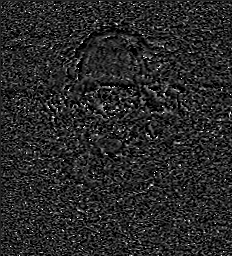
[im 19/55]
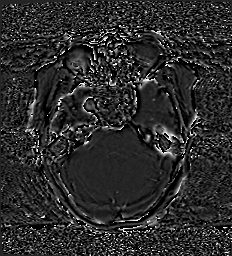
[im 37/55]
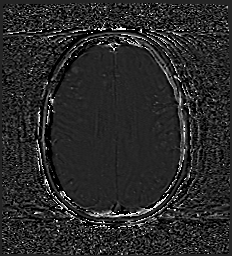
[im 55/55]
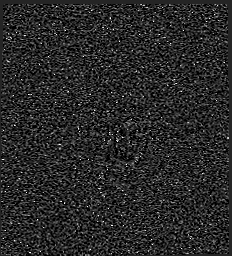

[Series 13: swi_images · axial · 3.0mm · 0.90mm/px · z∈[-132,+40]mm · 4 of 60 slices shown]
[im 1/60]
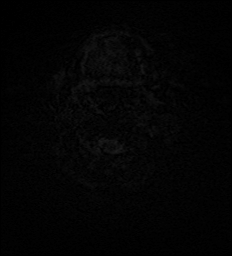
[im 20/60]
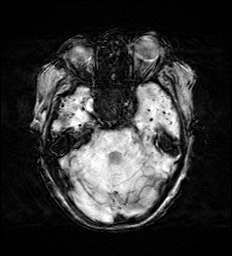
[im 40/60]
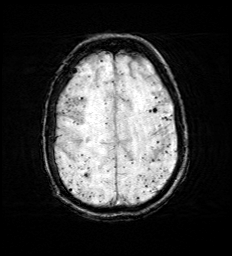
[im 60/60]
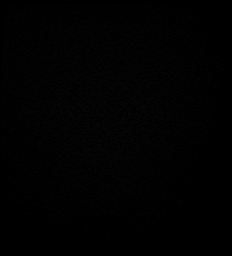

[Series 15: FLAIR · axial · 3.0mm · 0.53mm/px · z∈[-122,+35]mm · 4 of 55 slices shown]
[im 1/55]
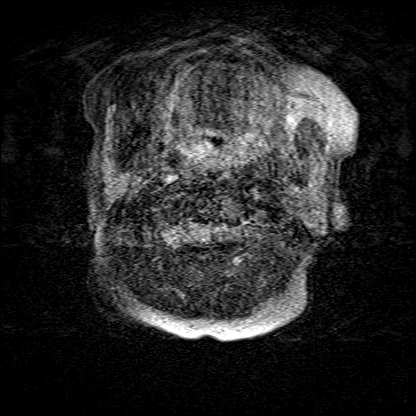
[im 19/55]
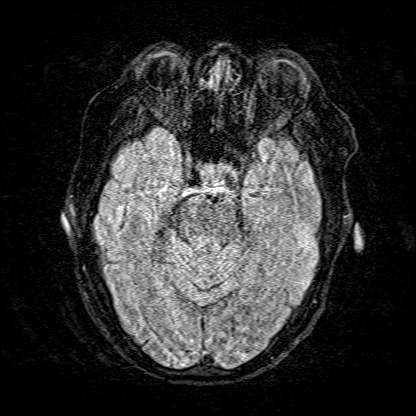
[im 37/55]
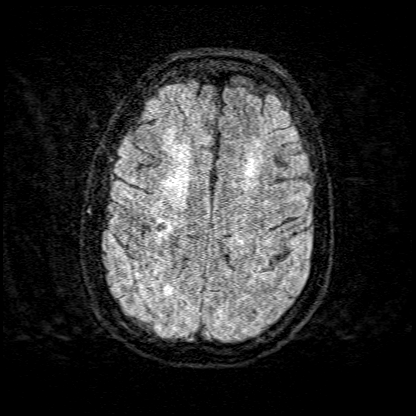
[im 55/55]
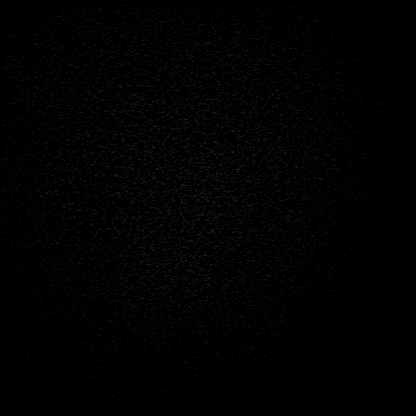

[Series 16: T1 · axial · 1.0mm · 0.98mm/px · z∈[-130,+39]mm · 11 of 176 slices shown (2 of 2)]
[im 1/176]
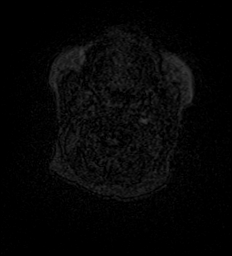
[im 15/176]
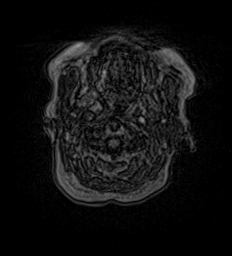
[im 30/176]
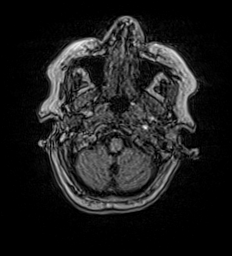
[im 44/176]
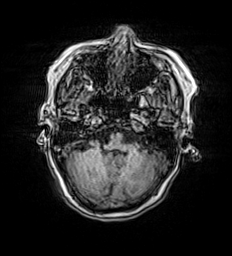
[im 59/176]
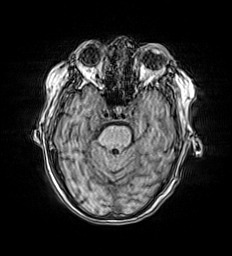
[im 73/176]
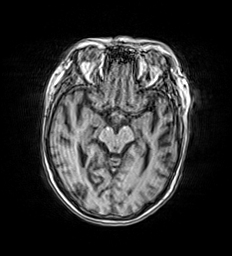
[im 88/176]
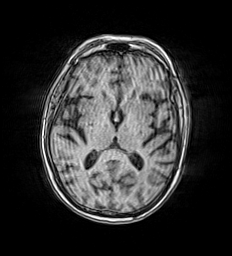
[im 103/176]
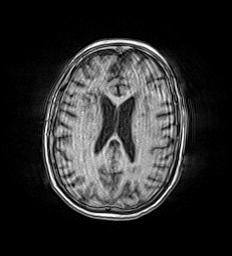
[im 117/176]
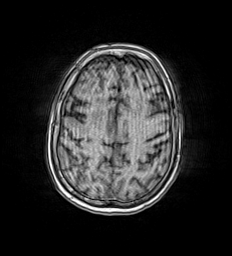
[im 146/176]
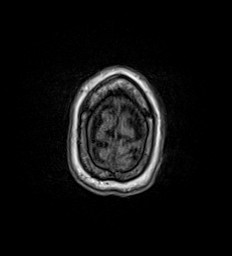
[im 176/176]
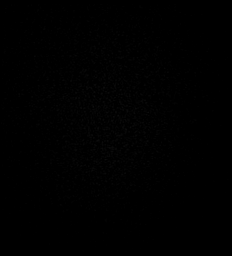

[Series 17: T2 · coronal · 5.0mm · 0.45mm/px · 2 of 31 slices shown (2 of 2)]
[im 1/31]
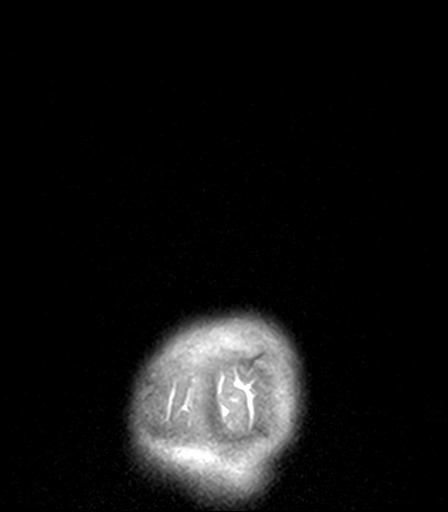
[im 31/31]
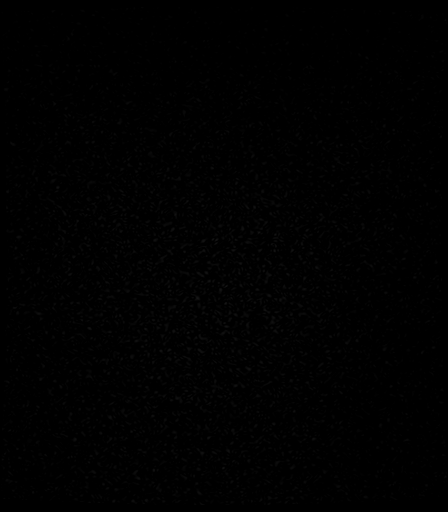

[Series 22: T1 post-contrast · coronal · 5.0mm · 0.90mm/px · 2 of 31 slices shown (1 of 2)]
[im 1/31]
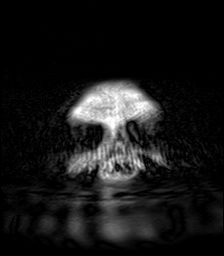
[im 31/31]
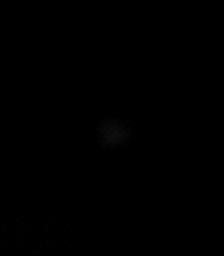

[Series 23: T1 post-contrast · axial · 5.0mm · 0.90mm/px · z∈[-157,-8]mm · 2 of 27 slices shown (2 of 2)]
[im 1/27]
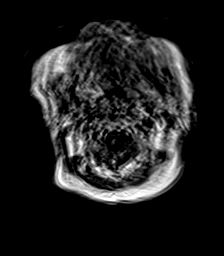
[im 27/27]
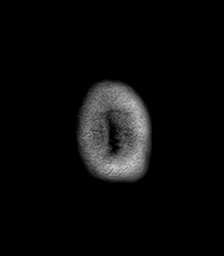

[46 of 48 positions shown; findings below may reference images not displayed]

FINDINGS: Brain: Punctate focus of abnormal diffusion restriction within the
right parietal lobe. Innumerable chronic microhemorrhages in a
predominantly peripheral distribution. Hyperintense T2-weighted
signal is moderately widespread throughout the white matter.
Generalized volume loss without a clear lobar predilection. Old
right corona radiata small vessel infarcts. The midline structures
are normal. Postcontrast imaging is severely motion degraded.

Vascular: Major flow voids are preserved.

Skull and upper cervical spine: Normal calvarium and skull base.
Visualized upper cervical spine and soft tissues are normal.

Sinuses/Orbits:No paranasal sinus fluid levels or advanced mucosal
thickening. No mastoid or middle ear effusion. Normal orbits.
IMPRESSION: 1. Punctate focus of acute ischemia within the right parietal lobe.
No hemorrhage or mass effect.
2. Innumerable chronic microhemorrhages in a predominantly
peripheral distribution, consistent with cerebral amyloid
angiopathy.
3. Moderate chronic microvascular ischemia and volume loss.

## 2021-06-20 IMAGING — DX DG CHEST 1V PORT
1 series · 1 of 1 positions shown · non-contrast
Comparison: [DATE]

CLINICAL DATA: Shortness of breath

EXAM:
PORTABLE CHEST 1 VIEW

[chest ap]
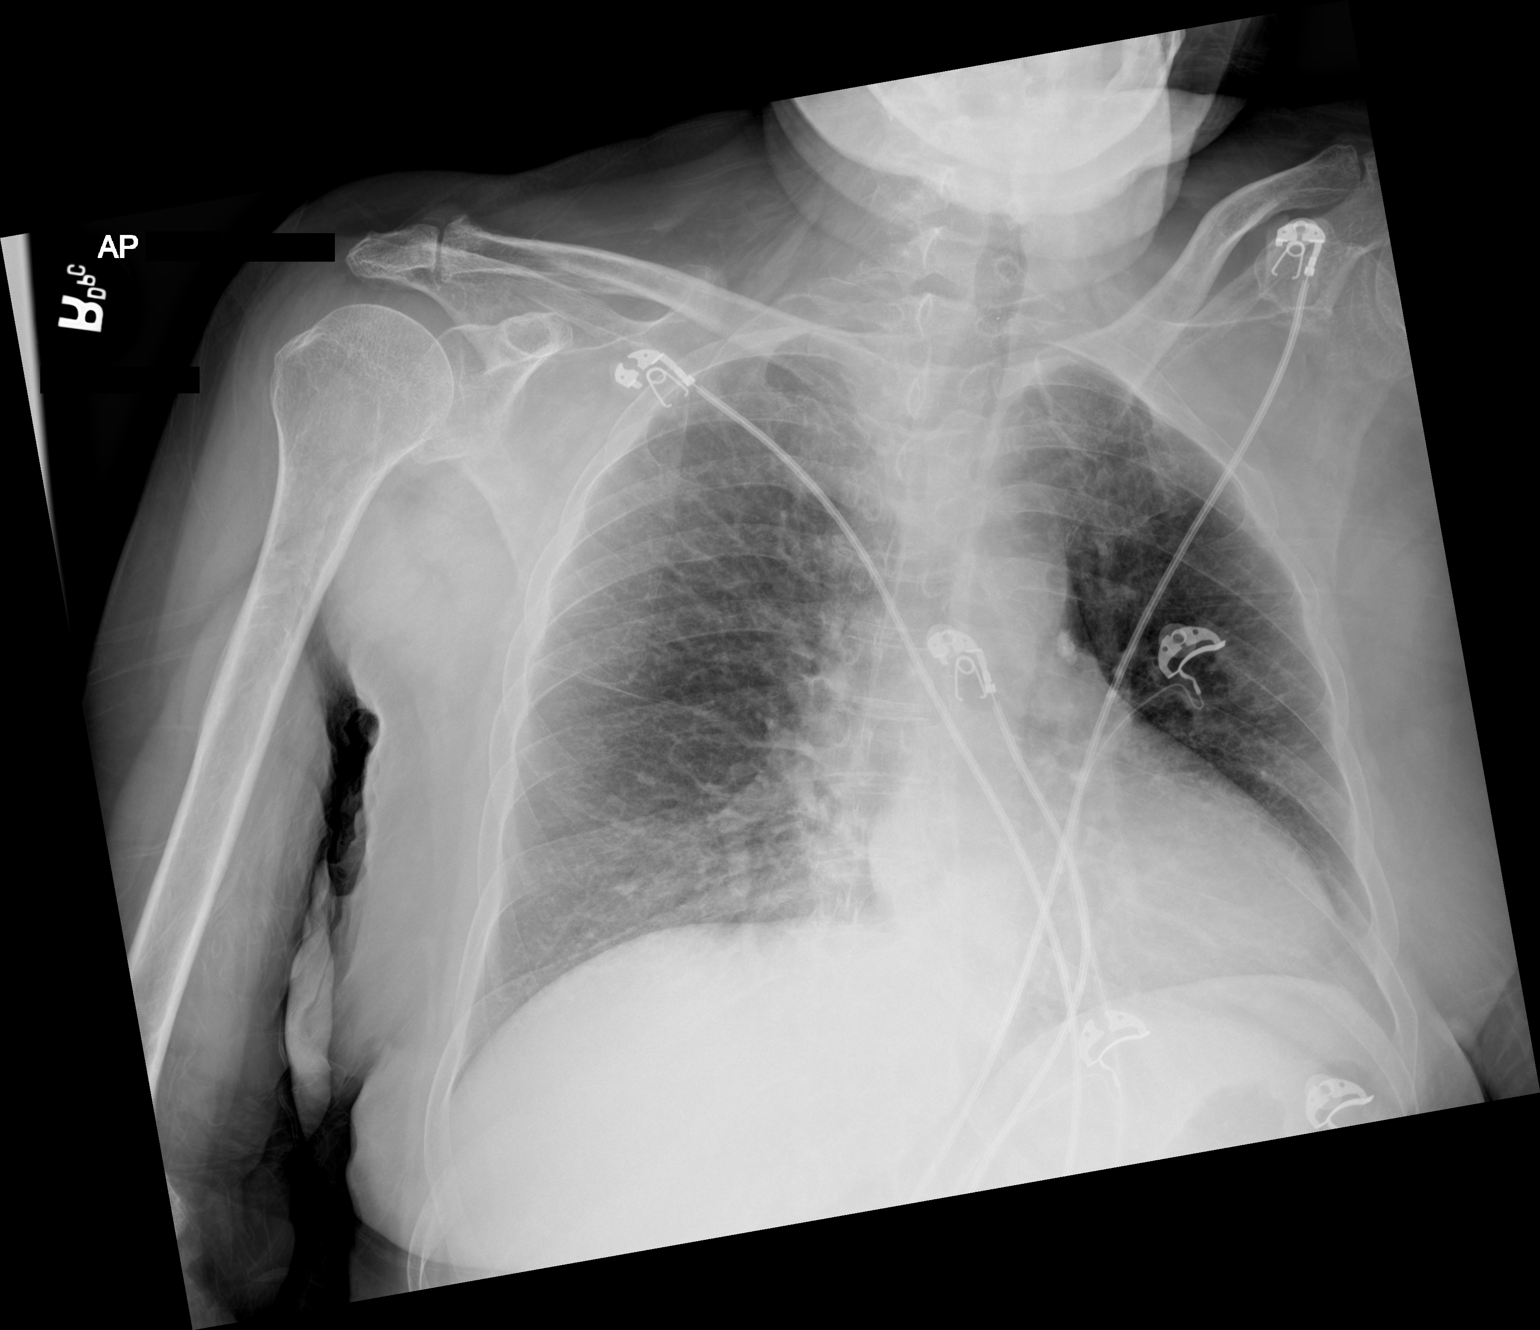

[1 of 1 positions shown; findings below may reference images not displayed]

FINDINGS: Heart is normal size. Mild interstitial prominence, most pronounced
in the right lower lobe. No effusions. No acute bony abnormality.
IMPRESSION: Interstitial prominence in the right lung, most pronounced in the
right lung base. This could reflect early infiltrate.

## 2021-06-20 IMAGING — CT CT HEAD CODE STROKE
4 series · 16 of 47 positions shown, 18 images · non-contrast
Comparison: None.

CLINICAL DATA: Code stroke.  Altered mental status



[Series 3: head wo · axial · 0.54mm/px · z∈[-160,-55]mm · 7 of 29 slices shown, 9 images]
[im 4/29  brain]
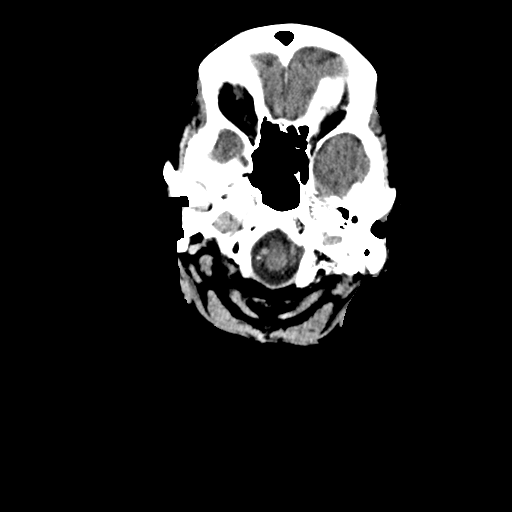
[im 4/29  bone]
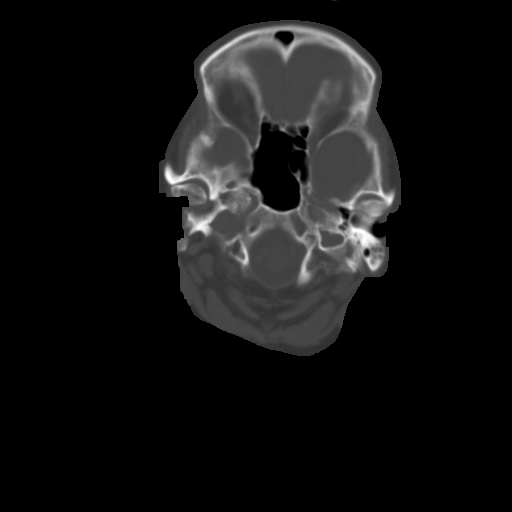
[im 8/29  brain]
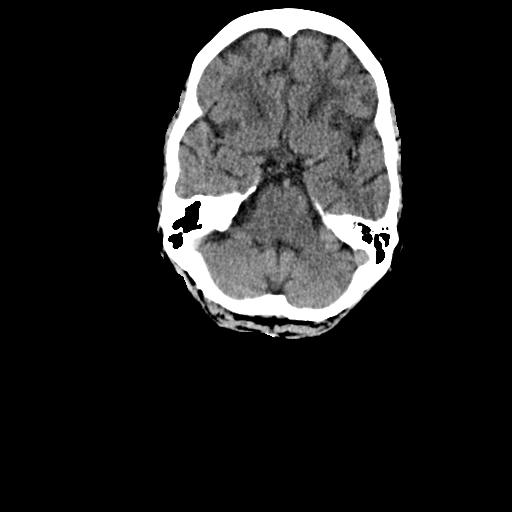
[im 11/29  brain]
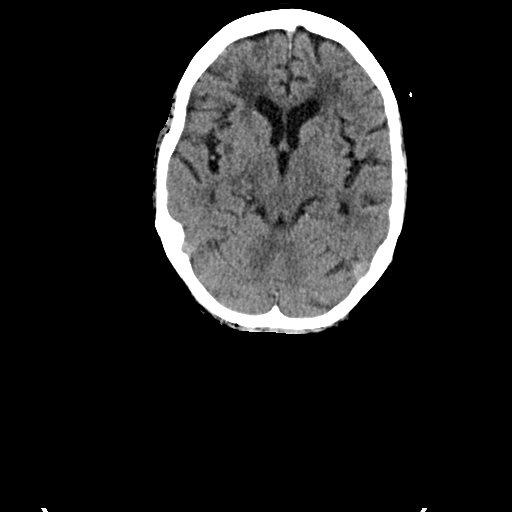
[im 15/29  brain]
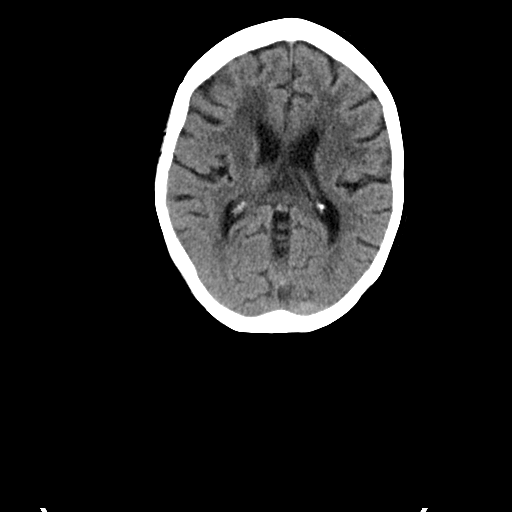
[im 18/29  brain]
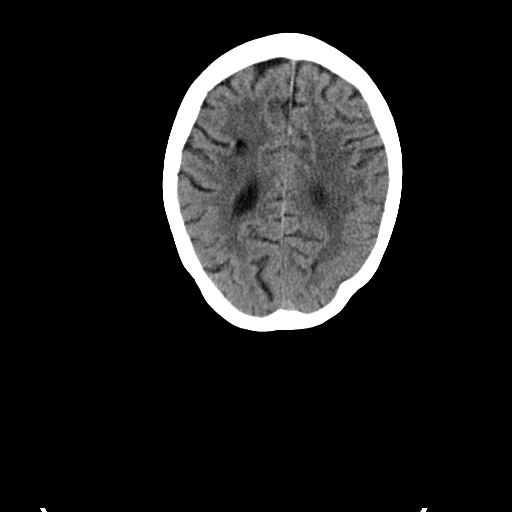
[im 18/29  bone]
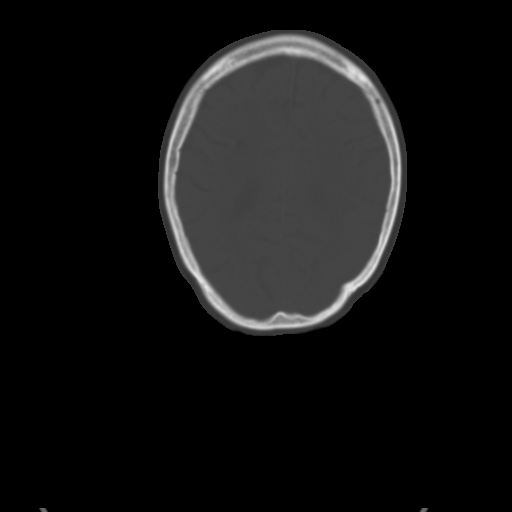
[im 22/29  brain]
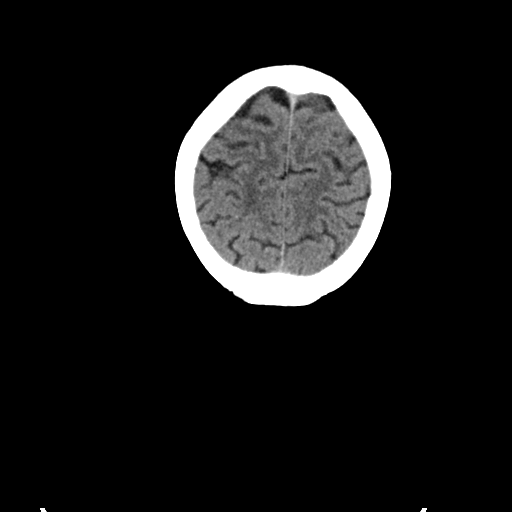
[im 25/29  brain]
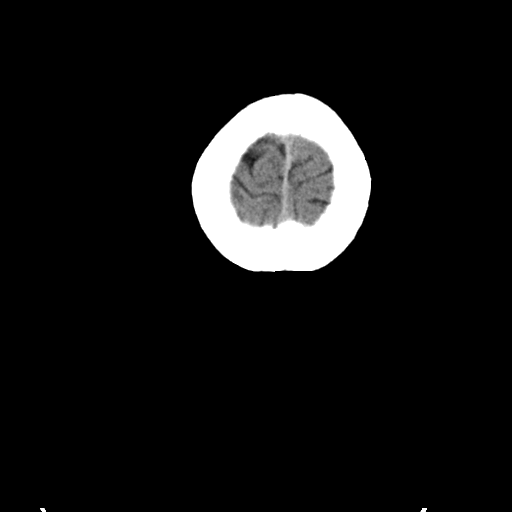

[Series 4: head bone · axial · 0.54mm/px · z∈[-161,-133]mm · 3 of 72 slices shown]
[im 8/72  bone]
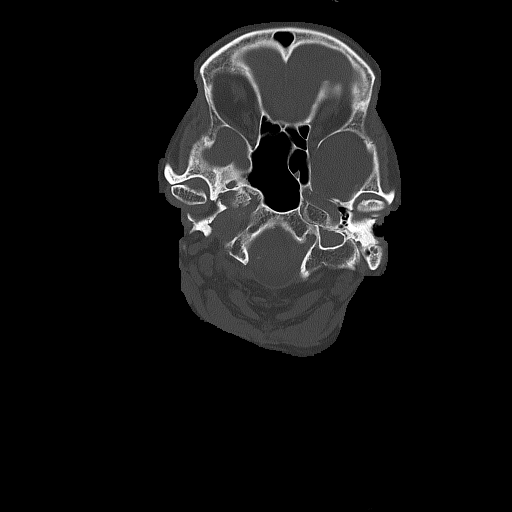
[im 15/72  bone]
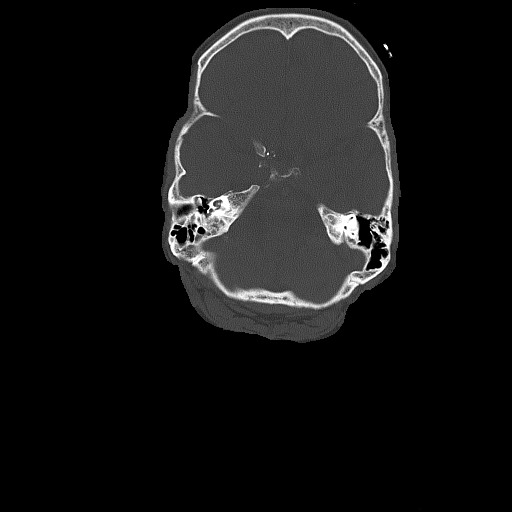
[im 22/72  bone]
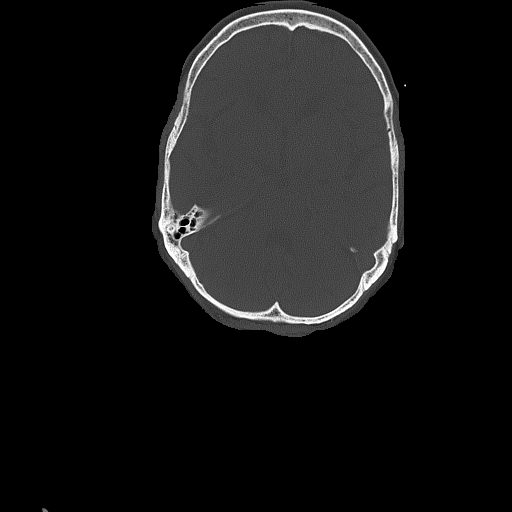

[Series 5: coronal soft tissue · coronal · 0.29mm/px · 3 of 63 slices shown]
[im 21/63  brain]
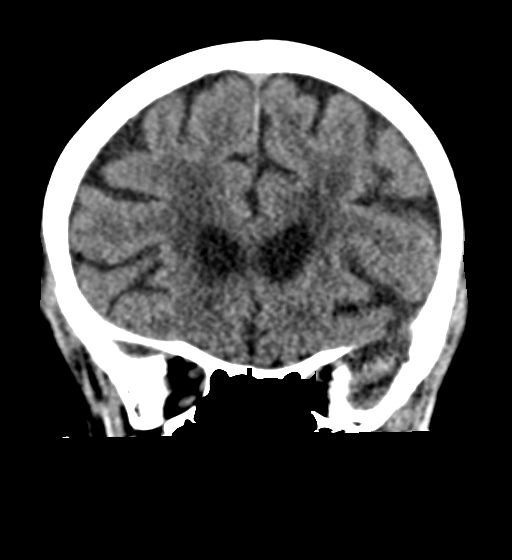
[im 28/63  brain]
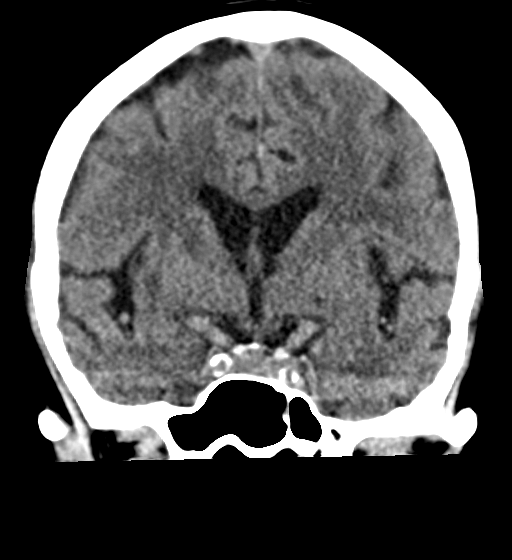
[im 35/63  brain]
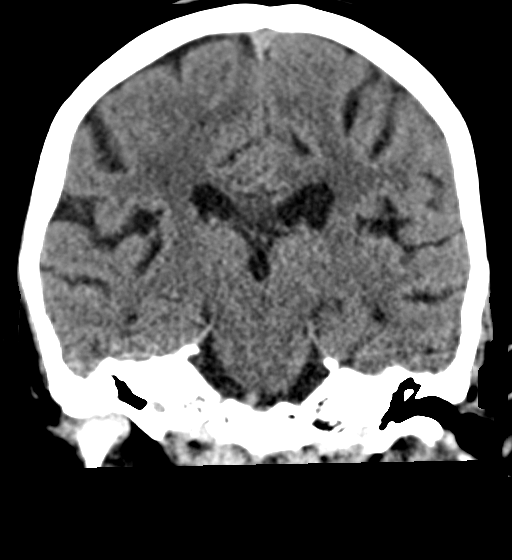

[Series 6: sagittal soft tissue · sagittal · 0.32mm/px · 3 of 50 slices shown]
[im 17/50  brain]
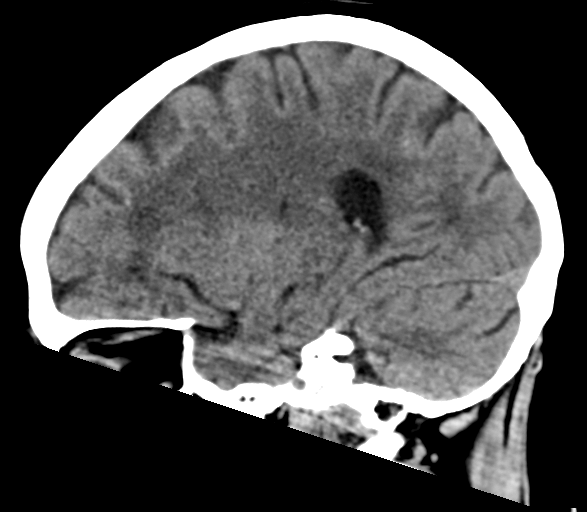
[im 25/50  brain]
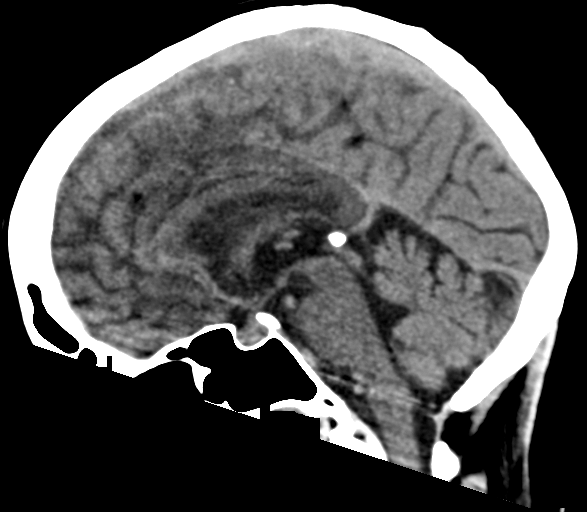
[im 33/50  brain]
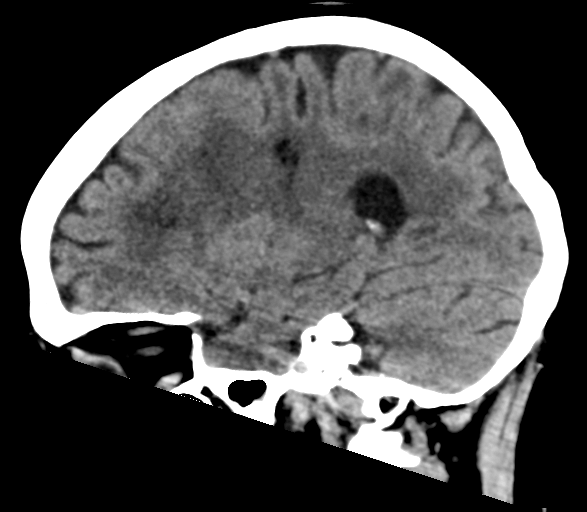

[16 of 47 positions shown; findings below may reference images not displayed]

FINDINGS: Brain: There is a small hyperdense focus within the superior right
frontal lobe (axial image 23 and coronal image 41). The size and
configuration of the ventricles and extra-axial CSF spaces are
normal. There is hypoattenuation of the periventricular white
matter, most commonly indicating chronic ischemic microangiopathy.

Old right basal ganglia small vessel infarct.

Vascular: No abnormal hyperdensity of the major intracranial
arteries or dural venous sinuses. No intracranial atherosclerosis.

Skull: The visualized skull base, calvarium and extracranial soft
tissues are normal.

Sinuses/Orbits: No fluid levels or advanced mucosal thickening of
the visualized paranasal sinuses. No mastoid or middle ear effusion.
The orbits are normal.
IMPRESSION: 1. Small hyperdense focus within the superior right frontal lobe,
favored to be a small amount of cortical parenchymal hemorrhage.
This may be a small hemorrhagic metastasis. Recommend MRI with and
without contrast.
2. Chronic ischemic microangiopathy and old right basal ganglia
small vessel infarct.

These results were called by telephone at the time of interpretation
on [DATE] at [DATE] to provider SORIN , who verbally
acknowledged these results.

## 2021-06-20 IMAGING — CT CT ABD-PELV W/ CM
3 of 7 series · 14 of 46 positions shown, 16 images · IV contrast (APPLIED)
Comparison: CT with IV contrast [DATE], [DATE] and
[DATE]

CLINICAL DATA: Acute nonlocalized abdominal pain.

EXAM:
CT ABDOMEN AND PELVIS WITHOUT AND WITH CONTRAST
TECHNIQUE: Multidetector CT imaging of the abdomen and pelvis was performed
using the standard protocol prior to and following bolus
administration of intravenous contrast.

[Series 2: routine abd/pel with · axial · 0.71mm/px · z∈[-556,-186]mm · 9 of 94 slices shown, 11 images (1 of 2)]
[im 10/94  soft-tissue]
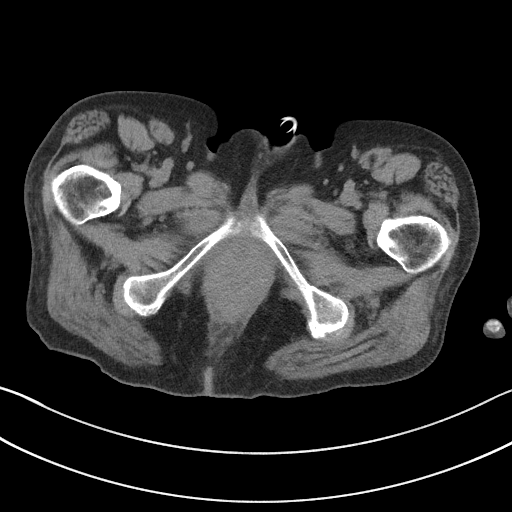
[im 10/94  bone]
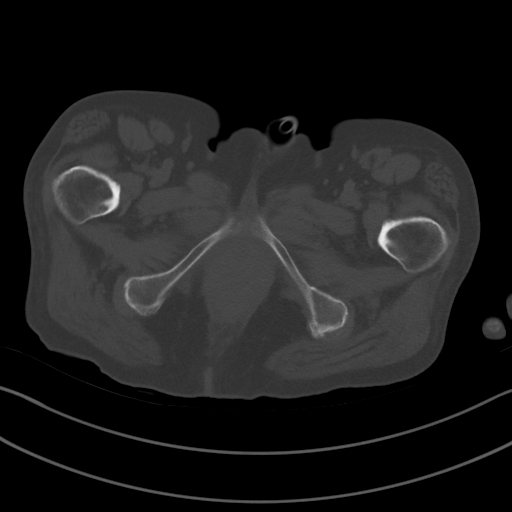
[im 19/94  soft-tissue]
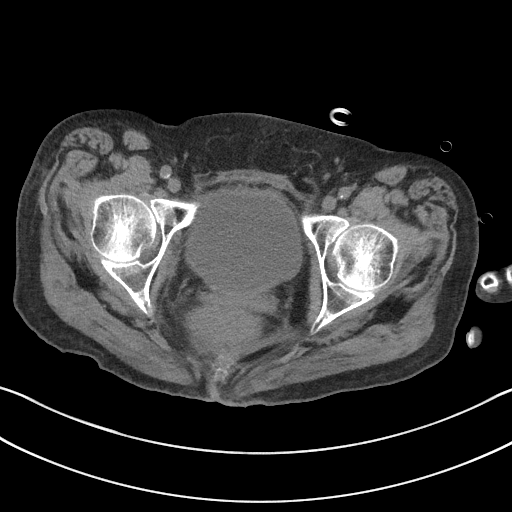
[im 28/94  soft-tissue]
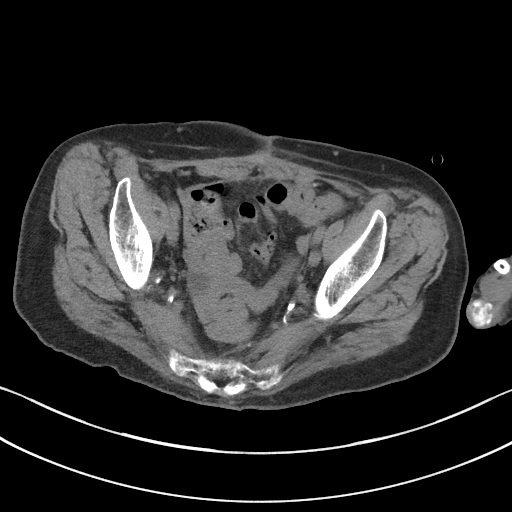
[im 38/94  soft-tissue]
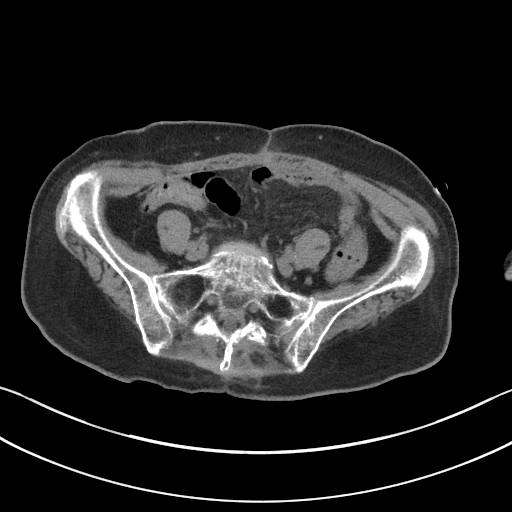
[im 47/94  soft-tissue]
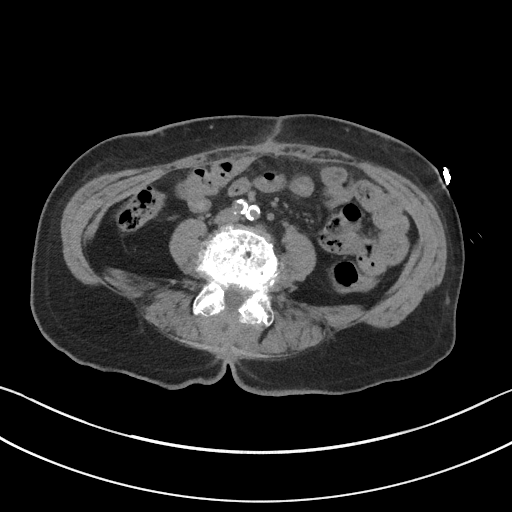
[im 56/94  soft-tissue]
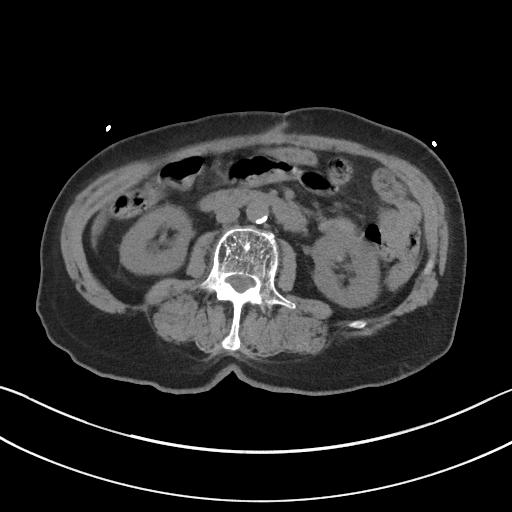
[im 66/94  soft-tissue]
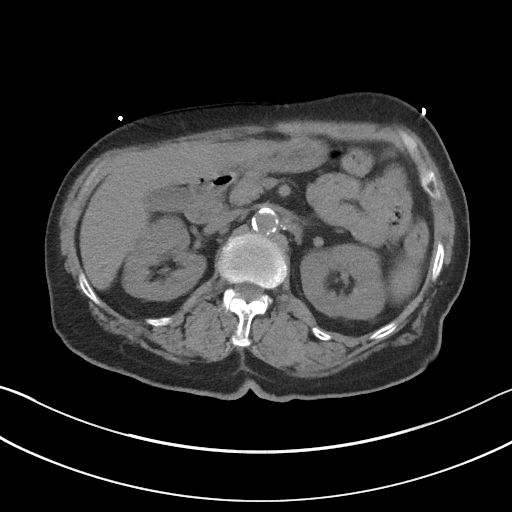
[im 75/94  soft-tissue]
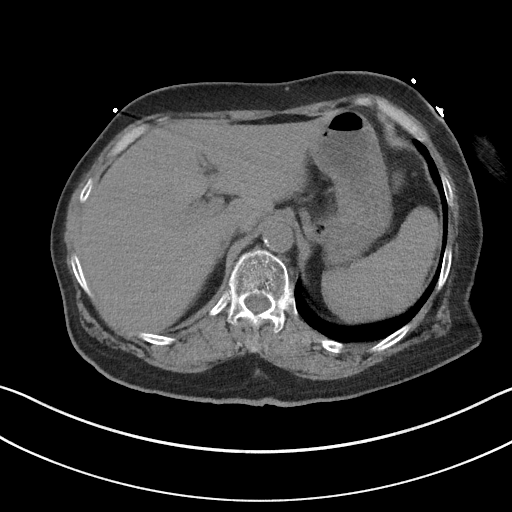
[im 84/94  soft-tissue]
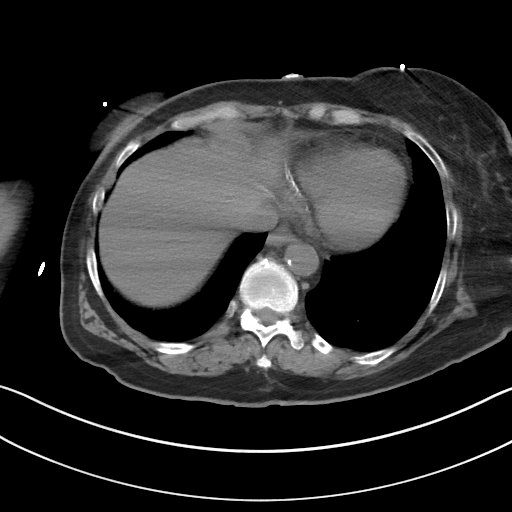
[im 84/94  bone]
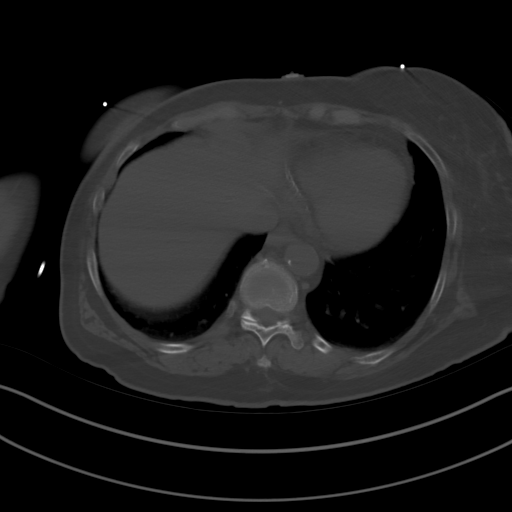

[Series 4: routine abd/pel with · axial · 0.71mm/px · z∈[-556,-511]mm · 2 of 94 slices shown (2 of 2)]
[im 10/94  soft-tissue]
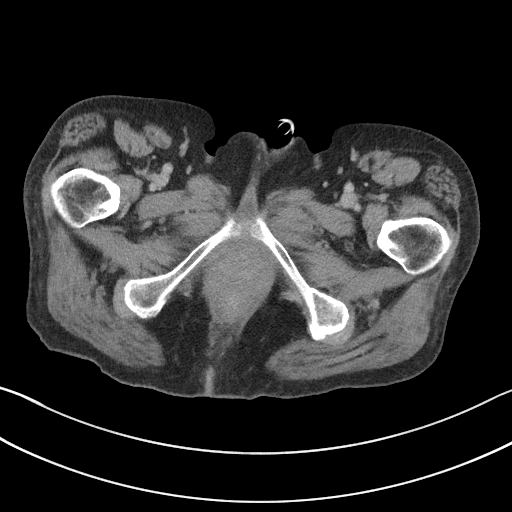
[im 19/94  soft-tissue]
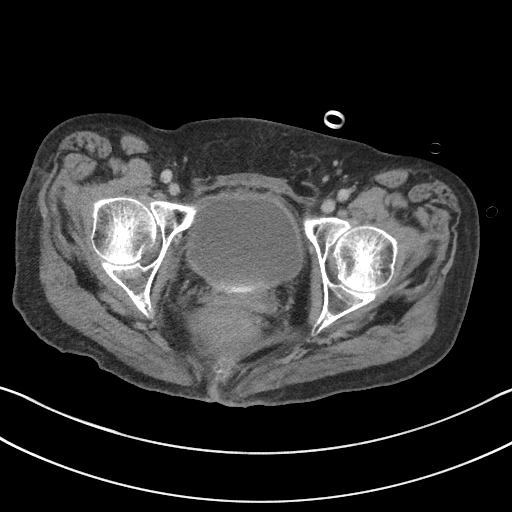

[Series 11: coronal st · coronal · 0.67mm/px · 3 of 83 slices shown]
[im 28/83  soft-tissue]
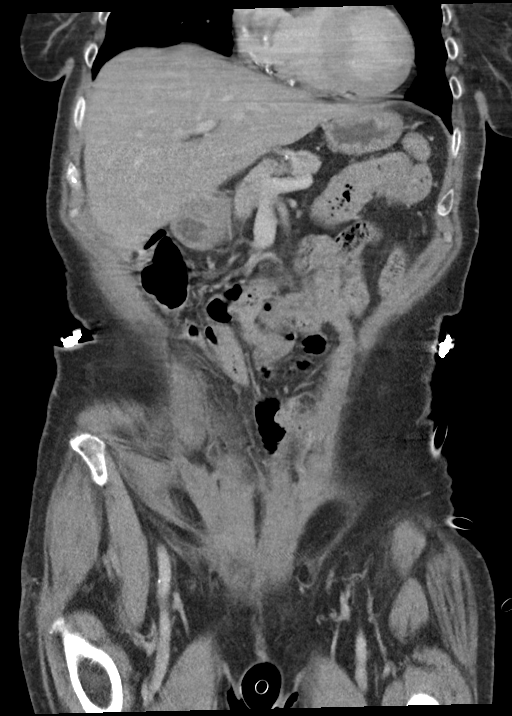
[im 37/83  soft-tissue]
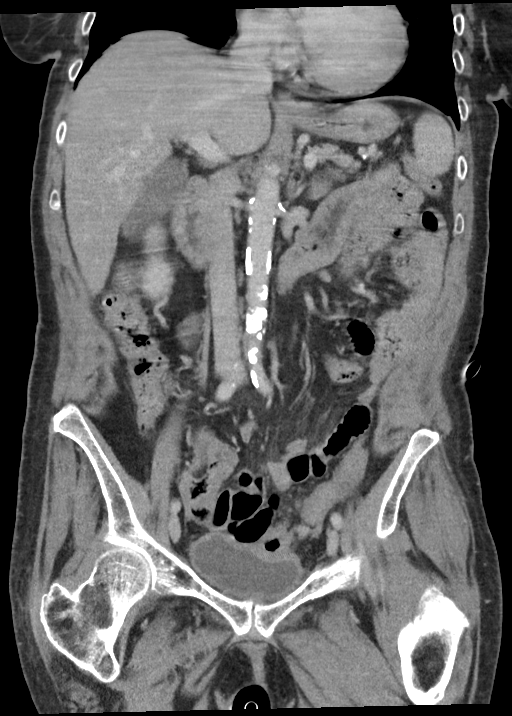
[im 46/83  soft-tissue]
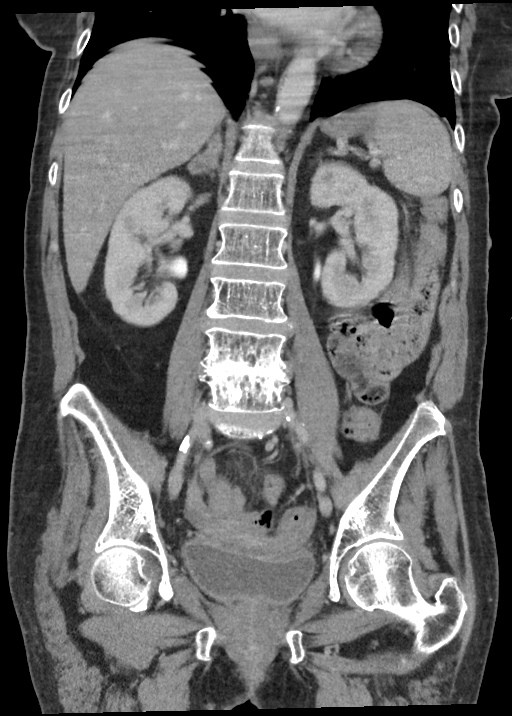

[14 of 46 positions shown; findings below may reference images not displayed]

RADIATION DOSE REDUCTION: This exam was performed according to the
departmental dose-optimization program which includes automated
exposure control, adjustment of the mA and/or kV according to
patient size and/or use of iterative reconstruction technique.

CONTRAST:  100mL OMNIPAQUE IOHEXOL 300 MG/ML  SOLN
FINDINGS: Factors affecting image quality: Respiratory motion.

Lower chest: Chronic 7 mm pleural-based noncalcified right middle
lobe nodule is stable consistent with a benign process. There are
calcifications in the LAD and right coronary arteries with normal
heart size. There is no lung base infiltrate.

Hepatobiliary: 18.5 cm length mildly steatotic liver. No mass is
seen through the motion artifact. Gallbladder and bile ducts are
unremarkable.

Pancreas: Unremarkable .

Spleen: No mass enhancement or splenomegaly.

Adrenals/Urinary Tract: Mild chronic nodular thickening both adrenal
glands. There is no evidence of urinary stones or obstruction. There
is no renal mass enhancement, calculus or hydronephrosis.

There are small left renal cysts. There previously was
hydronephrosis and mucosal enhancement in the renal pelvis and
ureters is not seen today. There is mild thickening of the bladder
versus underdistention.

Stomach/Bowel: There is interval increased thickening and adjacent
stranding involving the rectal wall concerning for proctitis or less
likely infiltrating disease given the finding was not present on
[DATE].

The gastric wall is contracted. The unopacified small bowel is
unremarkable. An appendix is not seen. There are colonic diverticula
without focal inflammatory change, but the distal descending and
sigmoid colon wall are thicker than the remainder which could be
inflammatory or from nondistention.

Vascular/Lymphatic: Aortic atherosclerosis. No enlarged abdominal or
pelvic lymph nodes.

Reproductive: Surgically absent uterus and cervix. Fiducial markers
are again noted in the pelvic floor alongside the vaginal cuff.
Right adnexal tubular structure presumably hydrosalpinx is
redemonstrated as well as ill-defined soft tissue just above the
vaginal cuff likely treatment related.

Other: Trace ascites in the posterior deep pelvis is similar to
prior studies. There is no free air, abscess or hemorrhage.

Musculoskeletal: There is osteopenia with degenerative changes of
the spine and chronic L4-5 disc collapse with endplate Schmorl's
nodes. Old L4-5 laminectomy defect. There is no destructive regional
bone lesion.
IMPRESSION: 1. Increased wall prominence in the distal descending and sigmoid
colon which could be due to colitis/diverticulitis or nondistention.
There are no inflammatory changes associated with this but there are
stranding changes around the increasingly thickened rectum, most
likely due to proctitis, less likely infiltrating disease. Consider
colonoscopy follow-up.
2. Cystitis versus bladder nondistention.
3. Postsurgical changes in the pelvis, with chronic ill-defined soft
tissue just above the vaginal cuff probably treatment related, and
with a right adnexal fluid-filled tubular structure most likely a
hydrosalpinx.
4. Aortoiliac atherosclerosis.
5. Chronic L4-5 disc collapse, laminectomy, osteopenia and
degenerative changes.

## 2021-06-20 MED ORDER — SODIUM CHLORIDE 0.9 % IV SOLN
500.0000 mg | Freq: Once | INTRAVENOUS | Status: AC
  Administered 2021-06-21: 500 mg via INTRAVENOUS
  Filled 2021-06-20: qty 5

## 2021-06-20 MED ORDER — IOHEXOL 300 MG/ML  SOLN
100.0000 mL | Freq: Once | INTRAMUSCULAR | Status: AC | PRN
  Administered 2021-06-20: 100 mL via INTRAVENOUS

## 2021-06-20 MED ORDER — AMPICILLIN-SULBACTAM SODIUM 3 (2-1) G IJ SOLR
3.0000 g | Freq: Once | INTRAMUSCULAR | Status: AC
Start: 2021-06-20 — End: 2021-06-21
  Administered 2021-06-21: 3 g via INTRAVENOUS
  Filled 2021-06-20: qty 8

## 2021-06-20 NOTE — Progress Notes (Signed)
2029-Code stroke activated (EMS pre-alert) ?Patient was taken to CT prior to being brought to the room ?2041-pt returned to ED from [place] joined teleneuro cart ?

## 2021-06-20 NOTE — ED Notes (Signed)
Returned to room 26 from CT. Dr. Starleen Blue at bedside ?

## 2021-06-20 NOTE — ED Triage Notes (Signed)
Pt found from home on the toilet pr  has hx of strokes, pt LVO 3 per ems and LKW 330pm; cbg 323; cbg here 280; pt daughter said pt is normally A&Ox4 and ambulates well; pt is on plavix, BP 190/93, HR 70, spo2 100% on RA, RR 20, pt not following commands at this time. ?

## 2021-06-20 NOTE — Progress Notes (Signed)
Chaplain responded to code stroke. No family present. Prayer for calmness, mimistry of presence ? ?

## 2021-06-20 NOTE — ED Notes (Signed)
Pt found wearing 2 briefs and covered in feces. Pt cleaned and placed in hospital provided scrubs. In- and - out catherization. Purewick placed afterwards.  ?

## 2021-06-20 NOTE — Progress Notes (Incomplete)
Pt had 100 ml isovue 300 contrast extravasation in left upper arm. Dr. Starleen Blue informed.  ?

## 2021-06-20 NOTE — ED Provider Notes (Signed)
? ?[facility] ?Provider Note ? ? ? Event Date/Time  ? First MD Initiated Contact with Patient 06/20/21 2040   ?  (approximate) ? ? ?History  ? ?Code Stroke ? ? ?HPI ? ?RUCHAMA KUBICEK is a 71 y.o. female with past medical history of diabetes, hypertension, hyperlipidemia, CVA with residual left-sided deficits who presents with altered mental status.  Per EMS patient was in her normal state of health when her daughter helped her to the bathroom around 3 PM.  Several hours later her daughter found her still sitting on the toilet.  EMS noted some left-sided weakness so stroke alert was called.  Unable to obtain any history from the patient she is pretty much nonverbal at this point. ? ?Reviewed most recent PCP note where it is documented that patient does have some behavioral disturbance and is on olanzapine for this.  As agitated delirium at times. ? ?  ? ?Past Medical History:  ?Diagnosis Date  ? Diabetes mellitus without complication ([place])   ? Endometrial cancer ([place])   ? details unavailable. brachytherapy seeds within region of cervix on CT imaging 2019  ? HTN (hypertension)   ? Peripheral neuropathy   ? Spinal stenosis   ? Stroke [facility])   ? left sided arm weakness  ? ? ?Patient Active Problem List  ? Diagnosis Date Noted  ? UTI (urinary tract infection) 02/02/2021  ? 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 Protein-calorie malnutrition, severe 11/18/2020  ? Enterocolitis 11/10/2020  ? Abdominal pain 11/10/2020  ? Nausea & vomiting 11/10/2020  ? AKI (acute kidney injury) ([place]) 11/10/2020  ? Hyperglycemia due to diabetes mellitus ([place]) 11/10/2020  ? Leukocytosis 11/10/2020  ? Mixed hyperlipidemia 11/10/2020  ? Tobacco use 11/10/2020  ? Asthma 11/10/2020  ? SIRS (systemic inflammatory response syndrome) ([place]) 11/10/2020  ? Restless leg syndrome 11/10/2020  ? Vitamin D deficiency 11/10/2020  ? Enteritis 11/10/2020  ? Cerebrovascular  accident (CVA) ([place])   ? Generalized weakness 10/25/2020  ? Essential hypertension 10/25/2020  ? Type 2 diabetes mellitus with hyperlipidemia ([place]) 10/25/2020  ? Psychiatric problem 10/25/2020  ? ? ? ?Physical Exam  ?Triage Vital Signs: ?ED Triage Vitals  ?Enc Vitals Group  ?   BP 06/20/21 2045 (!) 173/79  ?   Pulse Rate 06/20/21 2045 67  ?   Resp 06/20/21 2100 20  ?   Temp 06/20/21 2103 98.3 ?F (36.8 ?C)  ?   Temp Source 06/20/21 2103 Oral  ?   SpO2 06/20/21 2045 98 %  ?   Weight 06/20/21 2041 139 lb 1.8 oz (63.1 kg)  ?   Height 06/20/21 2041 _0  (1.626 m)  ?   Head Circumference --   ?   Peak Flow --   ?   Pain Score 06/20/21 2045 0  ?   Pain Loc --   ?   Pain Edu? --   ?   Excl. in [place]? --   ? ? ?Most recent vital signs: ?Vitals:  ? 06/20/21 2256 06/20/21 2300  ?BP: (!) 193/64 (!) 193/65  ?Pulse: 63 64  ?Resp: 16 15  ?Temp:    ?SpO2: 99% 98%  ? ? ? ?General: Awake, eyes are open, patient is chronically ill-appearing ?CV:  Good peripheral perfusion.  No peripheral edema ?Resp:  Normal effort.  No increased work of breathing ?Abd:  Mild lower abdominal tenderness throughout ?Neuro:  She is eyes are open, she tells me her name but does not answer other questions.  Does not follow commands.  Pupils are 3 mm round and reactive, face is symmetric she has diminished strength throughout but also question whether this is secondary to effort both arms and legs dropped after several seconds of them being passively lifted ?Other:   ? ? ?ED Results / Procedures / Treatments  ?Labs ?(all labs ordered are listed, but only abnormal results are displayed) ?Labs Reviewed  ?BLOOD GAS, VENOUS - Abnormal; Notable for the following components:  ?    Result Value  ? pCO2, Ven 41 (*)   ? All other components within normal limits  ?URINALYSIS, ROUTINE W REFLEX MICROSCOPIC - Abnormal; Notable for the following components:  ? Color, Urine YELLOW (*)   ? APPearance CLOUDY (*)   ? Glucose, UA >=500 (*)   ? Hgb urine dipstick  SMALL (*)   ? Ketones, ur 20 (*)   ? Protein, ur 100 (*)   ? Leukocytes,Ua LARGE (*)   ? WBC, UA >50 (*)   ? Bacteria, UA RARE (*)   ? All other components within normal limits  ?COMPREHENSIVE METABOLIC PANEL - Abnormal; Notable for the following components:  ? CO2 21 (*)   ? Glucose, Bld 255 (*)   ? All other components within normal limits  ?CBG MONITORING, ED - Abnormal; Notable for the following components:  ? Glucose-Capillary 280 (*)   ? All other components within normal limits  ?CBC WITH DIFFERENTIAL/PLATELET  ?PROTIME-INR  ?APTT  ?TROPONIN I (HIGH SENSITIVITY)  ? ? ? ?EKG ? ?EKG interpretation performed by myself: NSR, nml axis, nml intervals, no acute ischemic changes ? ? ? ?RADIOLOGY ?I reviewed the chest x-ray which shows some increased interstitial markings in the right lower lobe which could represent an infiltrate ? ? ?PROCEDURES: ? ?Critical Care performed: Yes, see critical care procedure note(s) ? ?Procedures ? ?The patient is on the cardiac monitor to evaluate for evidence of arrhythmia and/or significant heart rate changes. ? ? ?MEDICATIONS ORDERED IN ED: ?Medications  ?Ampicillin-Sulbactam (UNASYN) 3 g in sodium chloride 0.9 % 100 mL IVPB (has no administration in time range)  ?azithromycin (ZITHROMAX) 500 mg in sodium chloride 0.9 % 250 mL IVPB (has no administration in time range)  ?iohexol (OMNIPAQUE) 300 MG/ML solution 100 mL (100 mLs Intravenous Contrast Given 06/20/21 2306)  ? ? ? ?IMPRESSION / MDM / ASSESSMENT AND PLAN / ED COURSE  ?I reviewed the triage vital signs and the nursing notes. ?             ?               ? ?Differential diagnosis includes, but is not limited to, CVA, ICH, AKI, infection from UTI or PNA ? ? ?The patient is a 71 year old female with past medical history of prior CVA with residual left-sided weakness who presents with altered mental status today.  Currently she was brought to the bathroom to sit on the toilet around 3 PM but never got up.  Found still sitting on  the toilet at 7 PM by EMS.  Was noted to have left-sided weakness so stroke alert was called.  My evaluation patient opens her eyes but does not really follow commands.  Not saying much at all.  She has decreased strength throughout but it is also effort dependent.  Does not hold up either arms or her legs.  Does move all her extremities spontaneously.  Pupils are equal round and reactive and face is symmetric.  Patient was evaluated by teleneurology who agrees that this is likely more of a metabolic encephalopathy and diffuse process as opposed to focal CVA.  CT head was obtained as part of her stroke work-up which showed a hyperdense lesion in the right frontal lobe which is favored to be a small cortical hemorrhage could be potentially from a met.  I am not aware of prior cancer history.  Will work-up broadly for causes of encephalopathy including infectious and metabolic. ? ?Patient's labs are overall reassuring she has no leukocytosis she is mildly hyperglycemic but there is no signs of DKA.  Her troponin is negative.  No significant hypercarbia on VBG.  She is hypertensive but the rest of her vitals are within normal limits.  Chest x-ray shows possibly developing right lower lobe infiltrate.  UA has greater than 50 WBCs.  Unclear if patient is having urinary symptoms has grown Enterococcus in the past.  Given no other cause of her altered mental status do think she needs treatment for this.  Will cover with Unasyn and azithromycin after talking with pharmacy for best coverage of Enterococcus.  Do not see that she has had vancomycin-resistant Enterococcus in the past.  Patient also had abdominal tenderness on my exam so I ordered a CT of her abdomen which is pending at the time of signout.  She will need an MRI of her brain with and without contrast to further evaluate this cortical hyperdensity. ? ?Patient signed out pending CT abdomen.  Will need admission. ?  ? ? ?FINAL CLINICAL IMPRESSION(S) / ED DIAGNOSES   ? ?Final diagnoses:  ?Altered mental status, unspecified altered mental status type  ?Urinary tract infection without hematuria, site unspecified  ? ? ? ?Rx / DC Orders  ? ?ED Discharge Orders   ? ? None  ? ?

## 2021-06-20 NOTE — Progress Notes (Signed)
Dr. Libby Maw made aware of non-contrast head CT results ?

## 2021-06-20 NOTE — ED Notes (Signed)
Pt arrived via [facility] code stroke called in the field by EMS, pt taken to CT immediately on arrival after EDP McHugh cleared. Pt taken to CT 1, Katie A RN with patient as well as Dr. Starleen Blue, teleneuro cart in room, ED stretcher with zeroed out weight taken to CT.  ?

## 2021-06-20 NOTE — Consult Note (Addendum)
? ?[facility] Tele[facility] Consult Services ? ? ?Patient Name:   Cheyenne Cheyenne [name], Cheyenne Cheyenne [name] ?Date of Birth:   [birth year] ?Identification Number:   MRN - [id number] ?Date of Service:   06/20/2021 20:38:10 ? ?Diagnosis: ?      G93.41 - Encephalopathy Metabolic ? ?Impression: ?     Patient presented with altered mental status. Head CT shows Cheyenne [name] small hyperdense lesion. no thrombolysis since patient is outside of treatment time window and alternative diagnosis. Patient signs and symptoms are consistent with toxic metabolic encephalopathy.  ?CT findings seems to be more incidental findings. Would recommend MRI Brain wwo to rule out the brain lesions. Reasonable to hold all antiplatelets therapy.  ? ? ?Sign Out: ?      Discussed with Emergency Department Provider ? ? ? ?------------------------------------------------------------------------------ ? ?Advanced Imaging: ?Advanced Imaging Deferred because: ?Stroke not suspected with clinical presentation and exam ? ?Metrics: ?Last Known Well: 06/20/2021 15:30:00 ?[facility] Notification Time: 06/20/2021 20:38:10 ?Arrival Time: 06/20/2021 20:32:00 ?Stamp Time: 06/20/2021 20:38:10 ?Initial Response Time: 06/20/2021 20:42:36 ?Symptoms: Left sided weakness. ?NIHSS Start Assessment Time: 06/20/2021 20:44:35 ?Patient is not Cheyenne [name] candidate for Thrombolytic. ?Thrombolytic Medical Decision: 06/20/2021 20:50:08 ?Patient was not deemed candidate for Thrombolytic because of following reasons: ?Last Well Known Above 4.5 Hours. ? ?I personally Reviewed the CT Head and it Showed  ?1. Small hyperdense focus within the superior right frontal lobe, ?favored to be Cheyenne [name] small amount of cortical parenchymal hemorrhage. 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 Recommend MRI with and ?without contrast. ?2. 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 ?These results were called by telephone at the time of interpretation ?on 06/20/2021 at 8:53 pm to provider Cheyenne Cheyenne [name] , who verbally ?acknowledged these results. ? ?Primary Provider Notified of Diagnostic Impression and Management Plan on: 06/20/2021 20:53:53 ? ? ? ?------------------------------------------------------------------------------ ? ?History of Present Illness: ?Patient is Cheyenne [name] 71 year old Female. ? ?Patient was brought by EMS for symptoms of Left sided weakness. ?Past Medical history of Diabetes Mellitus, Hypertension, Hyperlipidemia, Stroke presented with left sided weakness. History was provided by the stroke RN. Patient was helped to the bathroom around 1500. She was found by EMS altered this evening in the same bathroom and was brought in for further evaluation. ?  ? ?Past Medical History: ?     Hypertension ?     Diabetes Mellitus ?     Hyperlipidemia ?     Stroke ? ?Medications: ? ?No Anticoagulant use  ?Antiplatelet use: Yes ASA/ Plavix ?Reviewed EMR for current medications ? ?Allergies:  ?Reviewed ? ?Social History: ?Unable To Obtain Due To Patient Status : Patient Cannot Speak ? ?Family History: ? ?There is no family history of premature cerebrovascular disease pertinent to this consultation ? ?ROS : ?14 Points Review of Systems was performed and was negative except mentioned in HPI. ? ?Past Surgical History: ?There Is No Surgical History Contributory To Today's Visit ? ?  ? ?Examination: ?BP(170/70), Pulse(70), Blood Glucose(280) ?1A: Level of Consciousness - Alert; keenly responsive + 0 ?1B: Ask Month and Age - Could Not Answer Either Question Correctly + 2 ?1C: Blink Eyes & Squeeze Hands - Performs 0 Tasks + 2 ?2: Test Horizontal Extraocular Movements - Normal + 0 ?3: Test Visual Fields - No Visual Loss + 0 ?4: Test Facial Palsy (Use Grimace if Obtunded) - Normal symmetry + 0 ?5A: Test Left Arm Motor Drift - Drift, hits bed + 2 ?5B: Test Right Arm Motor Drift - Drift, hits bed + 2 ?  6A: Test Left Leg Motor Drift - Drift, hits bed + 2 ?6B: Test Right Leg Motor Drift - Drift, hits bed + 2 ?7: Test Limb  Ataxia (FNF/Heel-Shin) - No Ataxia + 0 ?8: Test Sensation - Normal; No sensory loss + 0 ?9: Test Language/Aphasia - Mute/Global Aphasia: No Usable Speech/Auditory Comprehension + 3 ?10: Test Dysarthria - Mute/Anarthric + 2 ?11: Test Extinction/Inattention - No abnormality + 0 ? ?NIHSS Score: 17 ? ? ?Pre-Morbid Modified Rankin Scale: ?4 Points = Moderately severe disability; unable to walk and attend to bodily needs without assistance ? ? ?Patient/Family was informed the Neurology Consult would occur via TeleHealth consult by way of interactive audio and video telecommunications and consented to receiving care in this manner. ? ? ?Patient is being evaluated for possible acute neurologic impairment and high probability of imminent or life-threatening deterioration. I spent total of 25 minutes providing care to this patient, including time for face to face visit via telemedicine, review of medical records, imaging studies and discussion of findings with providers, the patient and/or family. ? ? ?Dr Neldon Newport Mc-ONeil Annita Ratliff ? ? ?[facility] ?[phone] ? ? ?Case [id number] ?

## 2021-06-21 ENCOUNTER — Encounter: Payer: Self-pay | Admitting: Internal Medicine

## 2021-06-21 ENCOUNTER — Inpatient Hospital Stay
Admit: 2021-06-21 | Discharge: 2021-06-21 | Disposition: A | Discharge status: Home / Self Care | Payer: Medicare HMO | Attending: Internal Medicine | Admitting: Internal Medicine

## 2021-06-21 DIAGNOSIS — Z885 Allergy status to narcotic agent status: Secondary | ICD-10-CM | POA: Diagnosis not present

## 2021-06-21 DIAGNOSIS — J690 Pneumonitis due to inhalation of food and vomit: Secondary | ICD-10-CM | POA: Diagnosis present

## 2021-06-21 DIAGNOSIS — G9341 Metabolic encephalopathy: Secondary | ICD-10-CM | POA: Diagnosis present

## 2021-06-21 DIAGNOSIS — Z794 Long term (current) use of insulin: Secondary | ICD-10-CM | POA: Diagnosis not present

## 2021-06-21 DIAGNOSIS — I6389 Other cerebral infarction: Secondary | ICD-10-CM | POA: Diagnosis present

## 2021-06-21 DIAGNOSIS — Z8542 Personal history of malignant neoplasm of other parts of uterus: Secondary | ICD-10-CM | POA: Diagnosis not present

## 2021-06-21 DIAGNOSIS — I680 Cerebral amyloid angiopathy: Secondary | ICD-10-CM | POA: Diagnosis present

## 2021-06-21 DIAGNOSIS — Z7984 Long term (current) use of oral hypoglycemic drugs: Secondary | ICD-10-CM | POA: Diagnosis not present

## 2021-06-21 DIAGNOSIS — I69354 Hemiplegia and hemiparesis following cerebral infarction affecting left non-dominant side: Secondary | ICD-10-CM

## 2021-06-21 DIAGNOSIS — I160 Hypertensive urgency: Secondary | ICD-10-CM | POA: Diagnosis present

## 2021-06-21 DIAGNOSIS — K529 Noninfective gastroenteritis and colitis, unspecified: Secondary | ICD-10-CM | POA: Diagnosis present

## 2021-06-21 DIAGNOSIS — I639 Cerebral infarction, unspecified: Secondary | ICD-10-CM | POA: Diagnosis not present

## 2021-06-21 DIAGNOSIS — Z79899 Other long term (current) drug therapy: Secondary | ICD-10-CM | POA: Diagnosis not present

## 2021-06-21 DIAGNOSIS — J45909 Unspecified asthma, uncomplicated: Secondary | ICD-10-CM | POA: Diagnosis present

## 2021-06-21 DIAGNOSIS — F03918 Unspecified dementia, unspecified severity, with other behavioral disturbance: Secondary | ICD-10-CM | POA: Diagnosis present

## 2021-06-21 DIAGNOSIS — Z87891 Personal history of nicotine dependence: Secondary | ICD-10-CM | POA: Diagnosis not present

## 2021-06-21 DIAGNOSIS — E1142 Type 2 diabetes mellitus with diabetic polyneuropathy: Secondary | ICD-10-CM | POA: Diagnosis present

## 2021-06-21 DIAGNOSIS — E1165 Type 2 diabetes mellitus with hyperglycemia: Secondary | ICD-10-CM | POA: Diagnosis present

## 2021-06-21 DIAGNOSIS — G928 Other toxic encephalopathy: Secondary | ICD-10-CM | POA: Diagnosis present

## 2021-06-21 DIAGNOSIS — Z7983 Long term (current) use of bisphosphonates: Secondary | ICD-10-CM | POA: Diagnosis not present

## 2021-06-21 DIAGNOSIS — Z888 Allergy status to other drugs, medicaments and biological substances status: Secondary | ICD-10-CM | POA: Diagnosis not present

## 2021-06-21 DIAGNOSIS — E854 Organ-limited amyloidosis: Secondary | ICD-10-CM | POA: Diagnosis present

## 2021-06-21 DIAGNOSIS — E119 Type 2 diabetes mellitus without complications: Secondary | ICD-10-CM | POA: Insufficient documentation

## 2021-06-21 DIAGNOSIS — N390 Urinary tract infection, site not specified: Secondary | ICD-10-CM | POA: Diagnosis present

## 2021-06-21 DIAGNOSIS — Z7902 Long term (current) use of antithrombotics/antiplatelets: Secondary | ICD-10-CM | POA: Diagnosis not present

## 2021-06-21 DIAGNOSIS — Z7982 Long term (current) use of aspirin: Secondary | ICD-10-CM | POA: Diagnosis not present

## 2021-06-21 DIAGNOSIS — I10 Essential (primary) hypertension: Secondary | ICD-10-CM | POA: Diagnosis present

## 2021-06-21 LAB — URINE DRUG SCREEN, QUALITATIVE (ARMC ONLY)
Amphetamines, Ur Screen: NOT DETECTED
Barbiturates, Ur Screen: NOT DETECTED
Benzodiazepine, Ur Scrn: NOT DETECTED
Cannabinoid 50 Ng, Ur ~~LOC~~: NOT DETECTED
Cocaine Metabolite,Ur ~~LOC~~: NOT DETECTED
MDMA (Ecstasy)Ur Screen: NOT DETECTED
Methadone Scn, Ur: NOT DETECTED
Opiate, Ur Screen: NOT DETECTED
Phencyclidine (PCP) Ur S: NOT DETECTED
Tricyclic, Ur Screen: NOT DETECTED

## 2021-06-21 LAB — ECHOCARDIOGRAM COMPLETE
AR max vel: 2.17 cm2
AV Area VTI: 2.14 cm2
AV Area mean vel: 2.22 cm2
AV Mean grad: 2 mmHg
AV Peak grad: 4.4 mmHg
Ao pk vel: 1.05 m/s
Area-P 1/2: 6.9 cm2
Height: 64 in
MV VTI: 2.81 cm2
S' Lateral: 3.21 cm
Weight: 2225.76 oz

## 2021-06-21 LAB — HEMOGLOBIN A1C
Hgb A1c MFr Bld: 8.4 % — ABNORMAL HIGH (ref 4.8–5.6)
Mean Plasma Glucose: 194.38 mg/dL

## 2021-06-21 LAB — CBG MONITORING, ED
Glucose-Capillary: 196 mg/dL — ABNORMAL HIGH (ref 70–99)
Glucose-Capillary: 163 mg/dL — ABNORMAL HIGH (ref 70–99)
Glucose-Capillary: 268 mg/dL — ABNORMAL HIGH (ref 70–99)

## 2021-06-21 LAB — LIPID PANEL
Cholesterol: 143 mg/dL (ref 0–200)
HDL: 48 mg/dL (ref >40)
LDL Cholesterol: 74 mg/dL (ref 0–99)
Total CHOL/HDL Ratio: 3 RATIO
Triglycerides: 107 mg/dL (ref <150)
VLDL: 21 mg/dL (ref 0–40)

## 2021-06-21 LAB — HIV ANTIBODY (ROUTINE TESTING W REFLEX): HIV Screen 4th Generation wRfx: NONREACTIVE

## 2021-06-21 LAB — GLUCOSE, CAPILLARY
Glucose-Capillary: 166 mg/dL — ABNORMAL HIGH (ref 70–99)
Glucose-Capillary: 141 mg/dL — ABNORMAL HIGH (ref 70–99)

## 2021-06-21 LAB — PROCALCITONIN: Procalcitonin: <0.1 ng/mL

## 2021-06-21 MED ORDER — LABETALOL HCL 5 MG/ML IV SOLN
10.0000 mg | INTRAVENOUS | Status: DC | PRN
  Administered 2021-06-21 – 2021-06-22 (×3): 10 mg via INTRAVENOUS
  Filled 2021-06-21 (×3): qty 4

## 2021-06-21 MED ORDER — GADOBUTROL 1 MMOL/ML IV SOLN
7.5000 mL | Freq: Once | INTRAVENOUS | Status: AC | PRN
  Administered 2021-06-21: 7.5 mL via INTRAVENOUS

## 2021-06-21 MED ORDER — ACETAMINOPHEN 325 MG PO TABS: 650.0000 mg | ORAL_TABLET | ORAL | Status: DC | PRN

## 2021-06-21 MED ORDER — METRONIDAZOLE 500 MG/100ML IV SOLN
500.0000 mg | Freq: Once | INTRAVENOUS | Status: AC
  Administered 2021-06-21: 500 mg via INTRAVENOUS
  Filled 2021-06-21: qty 100

## 2021-06-21 MED ORDER — SODIUM CHLORIDE 0.9 % IV SOLN
3.0000 g | Freq: Four times a day (QID) | INTRAVENOUS | Status: DC
  Administered 2021-06-21 – 2021-06-23 (×9): 3 g via INTRAVENOUS
  Filled 2021-06-21: qty 8
  Filled 2021-06-21 (×2): qty 3
  Filled 2021-06-21 (×2): qty 8
  Filled 2021-06-21 (×5): qty 3

## 2021-06-21 MED ORDER — ACETAMINOPHEN 650 MG RE SUPP: 650.0000 mg | RECTAL | Status: DC | PRN

## 2021-06-21 MED ORDER — SODIUM CHLORIDE 0.9 % IV SOLN: INTRAVENOUS | Status: DC

## 2021-06-21 MED ORDER — ACETAMINOPHEN 160 MG/5ML PO SOLN
650.0000 mg | ORAL | Status: DC | PRN
  Filled 2021-06-21: qty 20.3

## 2021-06-21 MED ORDER — INSULIN ASPART 100 UNIT/ML IJ SOLN
0.0000 [IU] | INTRAMUSCULAR | Status: DC
  Administered 2021-06-21 (×2): 3 [IU] via SUBCUTANEOUS
  Administered 2021-06-21: 8 [IU] via SUBCUTANEOUS
  Administered 2021-06-21: 2 [IU] via SUBCUTANEOUS
  Administered 2021-06-21: 3 [IU] via SUBCUTANEOUS
  Administered 2021-06-22: 8 [IU] via SUBCUTANEOUS
  Administered 2021-06-22: 2 [IU] via SUBCUTANEOUS
  Administered 2021-06-22: 5 [IU] via SUBCUTANEOUS
  Administered 2021-06-22: 11 [IU] via SUBCUTANEOUS
  Administered 2021-06-23: 3 [IU] via SUBCUTANEOUS
  Administered 2021-06-23: 8 [IU] via SUBCUTANEOUS
  Administered 2021-06-23: 2 [IU] via SUBCUTANEOUS
  Filled 2021-06-21 (×10): qty 1

## 2021-06-21 MED ORDER — STROKE: EARLY STAGES OF RECOVERY BOOK: Freq: Once | Status: DC

## 2021-06-21 MED ORDER — QUETIAPINE FUMARATE 25 MG PO TABS
25.0000 mg | ORAL_TABLET | Freq: Every day | ORAL | Status: DC
  Administered 2021-06-22: 25 mg via ORAL
  Filled 2021-06-21: qty 1

## 2021-06-21 MED ORDER — SODIUM CHLORIDE 0.9 % IV SOLN
500.0000 mg | INTRAVENOUS | Status: DC
  Filled 2021-06-21: qty 5

## 2021-06-21 MED ORDER — METRONIDAZOLE 500 MG/100ML IV SOLN: 500.0000 mg | Freq: Two times a day (BID) | INTRAVENOUS | Status: DC

## 2021-06-21 MED ORDER — LORAZEPAM 2 MG/ML IJ SOLN
1.0000 mg | Freq: Once | INTRAMUSCULAR | Status: AC
  Administered 2021-06-21: 1 mg via INTRAVENOUS
  Filled 2021-06-21: qty 1

## 2021-06-21 NOTE — ED Notes (Addendum)
MRI called out for pt being combative on table in MRI pt trying to get up and throwing hands around at this time.  ?

## 2021-06-21 NOTE — Progress Notes (Signed)
Eeg done 

## 2021-06-21 NOTE — Progress Notes (Signed)
?Progress Note ? ? ?Patient: Cheyenne [name] SKA:[id number] DOB: [birth year] DOA: 06/20/2021     0 ?DOS: the patient was seen and examined on 06/21/2021 ?  ?Brief hospital course: ?Taken from H&P. ? ?Cheyenne [name] is a 71 y.o. female with medical history significant for DM, HTN, left hemiparesis from prior stroke, HLD,  who at baseline ambulates independently who was brought to the ED by EMS after her daughter found her sitting on the commode several hours after she was helped to the commode at 3 PM.  Patient had previously been complaining of weakness.  A code stroke was activated for NIHSS of 17.  Head CT showed a small hyperdense lesion.  No thrombolysis was given as patient was outside tPA window and due to likely alternative diagnosis.  Neurology telemetry consult was done and findings were found to be more consistent with toxic metabolic encephalopathy. ?Additional data review: BP in ED as high as 213/101 with otherwise normal vitals.  Blood work showed glucose of 255, VBG with PCO2 of 41, urinalysis consistent with UTI but labs otherwise unremarkable.  Chest x-ray showing developing right middle lobe infiltrate.  CT abdomen and pelvis with possible sigmoid colitis/diverticulitis, cystitis.  MRI brain as recommended by neurologist showed the following: ?IMPRESSION: ?1. Punctate focus of acute ischemia within the right parietal lobe. ?No hemorrhage or mass effect. ?2. 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 ?3. Moderate chronic microvascular ischemia and volume loss. ? ?Patient received Unasyn, azithromycin and Flagyl in ED.  Flagyl was discontinued as Unasyn can provide anaerobic coverage. ? ?Concern of UTI versus pneumonia.  Baseline procalcitonin negative.  UA with pyuria and urine cultures pending.  Unremarkable CBC.  Doubt it is pneumonia so we will discontinue azithromycin. ?Lipid panel without any significant  normality, LDL of 74. ?UDS negative. ? ?4/19: Patient remained quite encephalopathic and only showing minimum response to sternal rub.  Protecting airway.  Not following any commands today.  Unable to obtain swallow evaluation so we are keeping her n.p.o. until mental status improves. ?Blood pressure remained elevated, mostly above 200, added as needed labetalol and we will allow permissive hypertension up to 203 systolic. ?Patient also has an history of underlying dementia and cognitive impairment, per PCP note this is a struggle with altered mental status for a long time and they were recommending continue Depakote and olanzapine for now.  Her psychiatrist was recommending restarting Lexapro clonazepam due to aggressive behavior.  ? ?She was recently admitted at [facility] with bladder rupture s/p repair and now Foley is out and she is voiding on her own.  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  And she was discharged to rehab after that admission. ? ?Neurology was consulted-will appreciate their recommendations. ? ? ?Assessment and Plan: ?* Acute metabolic encephalopathy ?Patient with advanced dementia with behavioral changes and prior history of becoming encephalopathic on different occasions. ?Can be due to UTI, less likely pneumonia as procalcitonin remain negative. ?UA with pyuria, urine cultures pending. ?MRI with punctate acute ischemia within the right parietal lobe with should not be responsible for this change in mental status. ?-Keep her n.p.o. until she is more awake and able to take p.o. ?-Continue with Unasyn so I can provide coverage for concern of aspiration and UTI ?-Follow-up urine cultures ?Fall and aspiration precautions ? ?Acute CVA (cerebrovascular accident) ([place]) ?Likely incidental finding based on review of neurology  note.  Neurologic deficit more consistent with encephalopathy ?Head CT shows a small  hyperdense lesion, confirmed on CT ?Multiple chronic microhemorrhages on MRIs consistent with amyloid. ?Neurology consult to follow. ?Routine stroke work-up ?  ? ?Urinary tract infection ?Started on Unasyn for possible aspiration which we will continue for UTI ?Follow cultures ? ?Aspiration pneumonia ([place]) ?Less likely pneumonia as procalcitonin remain negative. ?Discontinue Zithromax ?Continue Unasyn.   ?Keep n.p.o. with fall and aspiration precautions ? ?Hypertensive urgency ?Hold home antihypertensives of amlodipine and losartan to allow for permissive hypertension. ?-As needed labetalol for systolic above 009. ? ?Colitis on CT ?She was placed on Flagyl in ED for concern of colitis. ?Unasyn can provide anaerobic coverage so Flagyl was discontinued. ?-Continue to monitor ? ?Uncontrolled type 2 diabetes mellitus with hyperglycemia, with long-term current use of insulin ([place]) ?CBG elevated.  A1c pending. ?-Continue with sliding scale only as patient is n.p.o. ? ?Hemiparesis affecting left side as late effect of cerebrovascular accident (CVA) ([facility]) ?Left-sided upper extremity weakness from prior stroke.  Increase nursing assistance with transfers. ? ? ?Subjective: Patient was pretty much unresponsive when seen today.  Just blink eyes momentarily on sternal rub.  Not following any commands.  Protecting airway at this time. ? ?Physical Exam: ?Vitals:  ? 06/21/21 1015 06/21/21 1227 06/21/21 1230 06/21/21 1342  ?BP: (!) 155/142 (!) 163/62 129/64 (!) 150/113  ?Pulse: 72 73 71 68  ?Resp: '19 18  16  '$ ?Temp:  97.7 ?F (36.5 ?C)  98.4 ?F (36.9 ?C)  ?TempSrc:  Axillary    ?SpO2: 96% 96% 97% 99%  ?Weight:      ?Height:      ? ?General.  Unresponsive lady, in no acute distress. ?Pulmonary.  Lungs clear bilaterally, normal respiratory effort. ?CV.  Regular rate and rhythm, no JVD, rub or murmur. ?Abdomen.  Soft, nontender, nondistended, BS positive. ?CNS.  Minimum response to sternal rub.  Not following any commands ?Extremities.   No edema, no cyanosis, pulses intact and symmetrical. ?Psychiatry.  Judgment and insight appears impaired. ? ?Data Reviewed: ?Prior notes, labs and images reviewed. ? ?Family Communication: Discussed  With grand daughter on phone. ? ?Disposition: ?Status is: Inpatient ?Remains inpatient appropriate because: Severity of illness. ? ? Planned Discharge Destination: Home with Home Health ? ?Time spent: 50 minutes ? ?This record has been created using Systems analyst. Errors have been sought and corrected,but may not always be located. Such creation errors do not reflect on the standard of care. ? ?Author: ?Lorella Nimrod, MD ?06/21/2021 2:12 PM ? ?For on call review [email or web address].  ?

## 2021-06-21 NOTE — ED Notes (Signed)
Speech therapy at bedside.

## 2021-06-21 NOTE — Procedures (Signed)
Patient Name: Cheyenne [name]  ?MRN: [id number]  ?Epilepsy Attending: Lora Havens  ?Referring Physician/Provider: Kerney Elbe, MD ?Date: 06/21/2021 ?Duration: 21.32 mins ? ?Patient history:  71yo F patient presented with altered mental status.  EEG to evaluate for seizure ? ?Level of alertness: Awake, asleep ? ?AEDs during EEG study: None ? ?Technical aspects: This EEG study was done with scalp electrodes positioned according to the 10-20 International system of electrode placement. Electrical activity was acquired at a sampling rate of '500Hz'$  and reviewed with a high frequency filter of '70Hz'$  and a low frequency filter of '1Hz'$ . EEG data were recorded continuously and digitally stored.  ? ?Description: During awake state, no posterior dominant rhythm was seen. EEG showed continuous generalized 3 to 6 Hz theta-delta slowing. Sleep was characterized by vertex waves, sleep spindles (12 to 14 Hz), maximal frontocentral region. Hyperventilation and photic stimulation were not performed.    ? ?ABNORMALITY ?- Continuous slow, generalized ? ?IMPRESSION: ?This study is suggestive of moderate diffuse encephalopathy, nonspecific etiology. No seizures or epileptiform discharges were seen throughout the recording. ? ?Lora Havens  ? ?

## 2021-06-21 NOTE — Assessment & Plan Note (Addendum)
Hold home antihypertensives of amlodipine and losartan to allow for permissive hypertension. ?-As needed labetalol for systolic above 144. ?

## 2021-06-21 NOTE — Progress Notes (Signed)
Pharmacy Antibiotic Note ? ?Cheyenne [name] is a 71 y.o. female admitted on 06/20/2021 with aspiration pneumonia.  Pharmacy has been consulted for Unasyn dosing. ? ?Plan: ?Unasyn 3 gm q6h x 5 days per indication & renal fxn.  Pharmacy will continue to follow and will adjust and/or extend abx dosing as warranted. ? ?Height: '5\' 4"'$  (162.6 cm) ?Weight: 63.1 kg (139 lb 1.8 oz) ?IBW/kg (Calculated) : 54.7 ? ?Temp (24hrs), Avg:98.3 ?F (36.8 ?C), Min:98.3 ?F (36.8 ?C), Max:98.3 ?F (36.8 ?C) ? ?Recent Labs  ?Lab 06/20/21 ?2047  ?WBC 5.5  ?CREATININE 0.85  ?  ?Estimated Creatinine Clearance: 53.2 mL/min (by C-G formula based on SCr of 0.85 mg/dL).   ? ?Allergies  ?Allergen Reactions  ? Clonazepam   ?  Other reaction(s): Other (See Comments) ?irritability  ? Gabapentin   ?  Bad dreams  ? Pregabalin   ?  Drowsiness. Mind not clear.  ? Codeine Itching  ? ? ?Antimicrobials this admission: ?4/19 Azithromycin >> x 5 days ?4/19 Unasyn >> x 5 days ?4/19 Flagyl >>  ? ?Microbiology results: ?No lab cx ordered or pending at this time. ? ?Thank you for allowing pharmacy to be a part of this patient?s care. ? ?Renda Rolls, PharmD, MBA ?06/21/2021 ?2:06 AM ? ? ?

## 2021-06-21 NOTE — Evaluation (Addendum)
Clinical/Bedside Swallow Evaluation ?Patient Details  ?Name: Cheyenne [name] ?MRN: [id number] ?Date of Birth: 1950/12/21 ? ?Today's Date: 06/21/2021 ?Time: SLP Start Time (ACUTE ONLY): [id number] SLP Stop Time (ACUTE ONLY): 9794 ?SLP Time Calculation (min) (ACUTE ONLY): 45 min ? ?Past Medical History:  ?Past Medical History:  ?Diagnosis Date  ? Diabetes mellitus without complication ([place])   ? Endometrial cancer ([place])   ? details unavailable. brachytherapy seeds within region of cervix on CT imaging 2019  ? HTN (hypertension)   ? Peripheral neuropathy   ? Spinal stenosis   ? Stroke [facility])   ? left sided arm weakness  ? ?Past Surgical History:  ?Past Surgical History:  ?Procedure Laterality Date  ? APPENDECTOMY    ? BACK SURGERY  2016  ? laminectomy and facetectomy  ? ?HPI:  ?Pt is a 71 y.o. female with medical history significant for DM, HTN, left hemiparesis from prior stroke, HLD,  who at baseline ambulates independently who was brought to the ED by EMS after her daughter found her sitting on the commode several hours after she was helped to the commode at 3 PM.  Patient had previously been complaining of weakness.  A code stroke was activated for NIHSS of 17.  Head CT showed a small hyperdense lesion.  No thrombolysis was given as patient was outside tPA window and due to likely alternative diagnosis.  Neurology telemetry consult was done and findings were found to be more consistent with toxic metabolic encephalopathy.   ?Head CT shows a small hyperdense lesion. no thrombolysis since patient is outside of treatment time window and alternative diagnosis. Patient signs and symptoms are consistent with toxic metabolic encephalopathy.   ?MRI: Punctate focus of acute ischemia within the right parietal lobe.  No hemorrhage or mass effect.  2. Innumerable chronic microhemorrhages in a predominantly  peripheral distribution, consistent with cerebral amyloid  angiopathy.  3. Moderate chronic microvascular ischemia and volume loss.    ?CXR: Interstitial prominence in the right lung, most pronounced in the  right lung base. This could reflect early infiltrate.  ? ?  ?Assessment / Plan / Recommendation  ?Clinical Impression ? Pt seen for BSE this morning -- limited assessment d/t pt's behavior and Cognitive presentations. She exhibited reduced alertness; mumbled 1-2 words. Eyes remained tightly closed throughout session. Tremorous UEs and tight, drawn to her chest. In attempts to elicit oral awareness and stimulation, trials of ice chips were attempted as assessment.  ? ?Pt appears to present w/ oropharyngeal phase dysphagia in setting of Significantly declined Cognitive status; much reduced oral engagement and awareness. This decline in Cognitive presentation can impact overall awareness/timing of swallow and safety during po tasks which increases risk for aspiration, choking. Pt's risk for aspiration is HIGH at this time in setting of Acute illness, neurological change w/ ischemia in R parietal lobe this admit, previous CVA, and poor alertness/awareness. Full NPO status is recommended. ? ?OF NOTE: pt was seen by ST services in 10/2020 (admitted for Acute Infarct in the Right Frontal white matter with surrounding punctate acute frontal infarct; Numerous supratentorial chronic micro hemorrhages, probably related to amyloid angiopathy) w/ assessment that admit: "pt presents with mild cognitive deficits characterized by impaired orientation and working memory. Pt indicates that she has had some trouble with her memory since her stroke in July. She lives with her daughter and has appropriate level of supervision to assist with medication management.".  ? ?Oral care yielded reduced oral engagement for effective care and assessment of oral cavity.  Pt often bit down on swab stick. Moist oral cavity and anterior tongue noted. Edentulous status.  ?Pt responded to tactile stim of single ice chip placed at lips w/ lip pursing and intermittent sucking on the  chips. Pharyngeal swallows were appreciated; no overt coughing nor decline in respiratory presentation noted; O2 sats remained 97%. Due to pt's limited engagement and poor Cognitive awareness, no further trial consistencies were presented.  ?No unilateral weakness noted during brief lingual extension to lips; no unilateral labial weakness noted.  ? ?Recommend pt remain NPO w/ frequent oral care for hygiene and stimulation of swallowing; aspiration precautions. ST services will f/u w/ ongoing assessment of swallowing daily to establish a safe, least restrictive oral diet. Will also follow for updated cognitive-linguistic assessment when appropriate to determine needs for Discharge f/u. NSG and MD updated.  ?SLP Visit Diagnosis: Dysphagia, oropharyngeal phase (R13.12) (reduced Cognitive awareness and engagement) ?   ?Aspiration Risk ? Severe aspiration risk;Risk for inadequate nutrition/hydration  ?  ?Diet Recommendation   NPO status including meds.  ? ?Medication Administration: Via alternative means  ?  ?Other  Recommendations Recommended Consults:  (Dietician f/u) ?Oral Care Recommendations: Oral care QID;Staff/trained caregiver to provide oral care ?Other Recommendations:  (TBD)   ? ?Recommendations for follow up therapy are one component of a multi-disciplinary discharge planning process, led by the attending physician.  Recommendations may be updated based on patient status, additional functional criteria and insurance authorization. ? ?Follow up Recommendations Skilled nursing-short term rehab (<3 hours/day) (TBD)  ? ? ?  ?Assistance Recommended at Discharge Frequent or constant Supervision/Assistance  ?Functional Status Assessment  (unable to assess today)  ?Frequency and Duration min 3x week  ?2 weeks ?  ?   ? ?Prognosis Prognosis for Safe Diet Advancement: Guarded ?Barriers to Reach Goals: Cognitive deficits;Language deficits;Severity of deficits;Behavior  ? ?  ? ?Swallow Study   ?General Date of Onset:  06/20/21 ?HPI: Pt is a 71 y.o. female with medical history significant for DM, HTN, left hemiparesis from prior stroke, HLD,  who at baseline ambulates independently who was brought to the ED by EMS after her daughter found her sitting on the commode several hours after she was helped to the commode at 3 PM.  Patient had previously been complaining of weakness.  A code stroke was activated for NIHSS of 17.  Head CT showed a small hyperdense lesion.  No thrombolysis was given as patient was outside tPA window and due to likely alternative diagnosis.  Neurology telemetry consult was done and findings were found to be more consistent with toxic metabolic encephalopathy.  Head CT shows a small hyperdense lesion. no thrombolysis since patient is outside of treatment time window and alternative diagnosis. Patient signs and symptoms are consistent with toxic metabolic encephalopathy.  MRI: Punctate focus of acute ischemia within the right parietal lobe.  No hemorrhage or mass effect.  2. Innumerable chronic microhemorrhages in a predominantly  peripheral distribution, consistent with cerebral amyloid  angiopathy.  3. Moderate chronic microvascular ischemia and volume loss.  CXR: Interstitial prominence in the right lung, most pronounced in the  right lung base. This could reflect early infiltrate. ?Type of Study: Bedside Swallow Evaluation ?Previous Swallow Assessment: none; but did have a cog-linguistic assessment in 10/2020: "Pt presents with mild cognitive deficits characterized by impaired orientation and working memory. Pt indicates that she has had some trouble with her memory since her stroke in July. She lives with her daughter and has appropriate level of supervision to assist  with medication management." ?Diet Prior to this Study: NPO ?Temperature Spikes Noted: No (wbc 5.5) ?Respiratory Status: Nasal cannula (2L) ?History of Recent Intubation: No ?Behavior/Cognition: Confused;Doesn't follow directions (shutdown;  poorly engaged) ?Oral Cavity Assessment:  (limited view of anterior oral cavity) ?Oral Care Completed by SLP: Yes ?Oral Cavity - Dentition: Edentulous ?Vision:  (n/a) ?Self-Feeding Abilities: Total assis

## 2021-06-21 NOTE — Assessment & Plan Note (Addendum)
CBG elevated.  A1c pending. ?-Continue with sliding scale only as patient is n.p.o. ?

## 2021-06-21 NOTE — Progress Notes (Signed)
Pt only arousable to painful stimuli. Could not do most of her neuro/NIH assessment nor able to do most of assessment as pt was unable tro cooperate at this time. ?

## 2021-06-21 NOTE — H&P (Addendum)
?History and Physical  ? ? ?Patient: Cheyenne [name] HYQ:[id number] DOB: 1950/04/25 ?DOA: 06/20/2021 ?DOS: the patient was seen and examined on 06/21/2021 ?PCP: Toni Arthurs, PA  ?Patient coming from: Home ? ?Chief Complaint:  ?Chief Complaint  ?Patient presents with  ? Code Stroke  ? ? ?HPI: Cheyenne [name] is a 71 y.o. female with medical history significant for DM, HTN, left hemiparesis from prior stroke, HLD,  who at baseline ambulates independently who was brought to the ED by EMS after her daughter found her sitting on the commode several hours after she was helped to the commode at 3 PM.  Patient had previously been complaining of weakness.  A code stroke was activated for NIHSS of 17.  Head CT showed a small hyperdense lesion.  No thrombolysis was given as patient was outside tPA window and due to likely alternative diagnosis.  Neurology telemetry consult was done and findings were found to be more consistent with toxic metabolic encephalopathy. ?Additional data review: BP in ED as high as 213/101 with otherwise normal vitals.  Blood work showed glucose of 255, VBG with PCO2 of 41, urinalysis consistent with UTI but labs otherwise unremarkable.  Chest x-ray showing developing right middle lobe infiltrate.  CT abdomen and pelvis with possible sigmoid colitis/diverticulitis, cystitis.  MRI brain as recommended by neurologist showed the following: ?IMPRESSION: ?1. Punctate focus of acute ischemia within the right parietal lobe. ?No hemorrhage or mass effect. ?2. 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 ?3. Moderate chronic microvascular ischemia and volume loss ? ?Patient was treated with Unasyn and azithromycin and Flagyl.  Hospitalist consulted for admission  ? ?Review of Systems  ?Unable to perform ROS: Mental status change  ? ?Past Medical History:  ?Diagnosis Date  ? Diabetes mellitus without complication ([place])   ?  Endometrial cancer ([place])   ? details unavailable. brachytherapy seeds within region of cervix on CT imaging 2019  ? HTN (hypertension)   ? Peripheral neuropathy   ? Spinal stenosis   ? Stroke [facility])   ? left sided arm weakness  ? ?Past Surgical History:  ?Procedure Laterality Date  ? APPENDECTOMY    ? BACK SURGERY  2016  ? laminectomy and facetectomy  ? ?Social History:  reports that she quit smoking about 7 months ago. Her smoking use included cigarettes. She smoked an average of .5 packs per day. She has never used smokeless tobacco. She reports that she does not currently use alcohol. She reports that she does not use drugs. ? ?Allergies  ?Allergen Reactions  ? Clonazepam   ?  Other reaction(s): Other (See Comments) ?irritability  ? Gabapentin   ?  Bad dreams  ? Pregabalin   ?  Drowsiness. Mind not clear.  ? Codeine Itching  ? ? ?History reviewed. No pertinent family history. ? ?Prior to Admission medications   ?Medication Sig Start Date End Date Taking? Authorizing Provider  ?acetaminophen (TYLENOL) 325 MG tablet Take 650 mg by mouth every 6 (six) hours as needed for mild pain.    [provider]  ?albuterol (VENTOLIN HFA) 108 (90 Base) MCG/ACT inhaler Inhale 1-2 puffs into the lungs every 6 (six) hours as needed for wheezing or shortness of breath. 01/18/19   Gertie Baron, NP  ?alendronate (FOSAMAX) 70 MG tablet Take 70 mg by mouth once a week. Wednesday 10/03/20   [provider]  ?amLODipine (NORVASC) 10 MG tablet Take 1 tablet (10 mg total) by mouth daily.  10/31/20   Regalado, Jerald Kief A, MD  ?aspirin EC 81 MG EC tablet Take 1 tablet (81 mg total) by mouth daily. Swallow whole. 01/27/21   Roxan Hockey, MD  ?atorvastatin (LIPITOR) 40 MG tablet Take 80 mg by mouth daily. 11/26/17   [provider]  ?cholecalciferol (VITAMIN D3) 25 MCG (1000 UNIT) tablet Take 1,000 Units by mouth daily.    [provider]  ?clopidogrel (PLAVIX) 75 MG tablet Take 1 tablet (75 mg total) by  mouth daily. 01/27/21   Roxan Hockey, MD  ?escitalopram (LEXAPRO) 20 MG tablet Take 20 mg by mouth daily. 01/03/21   [provider]  ?glimepiride (AMARYL) 1 MG tablet Take 1 mg by mouth daily with breakfast. 11/05/17   [provider]  ?insulin NPH Human (NOVOLIN N) 100 UNIT/ML injection Inject 0.13 mLs (13 Units total) into the skin 2 (two) times daily at 8 am and 10 pm. ?Patient taking differently: Inject 20-40 Units into the skin 2 (two) times daily at 8 am and 10 pm. Inject 40 in the morning and 20 units every evening 11/21/20   Wynetta Emery, Clanford L, MD  ?loperamide (IMODIUM) 2 MG capsule Take 1 capsule (2 mg total) by mouth 3 (three) times daily as needed for diarrhea or loose stools. 11/21/20   Johnson, Clanford L, MD  ?loratadine (CLARITIN) 10 MG tablet Take 10 mg by mouth daily.    [provider]  ?losartan (COZAAR) 50 MG tablet Take 50 mg by mouth 2 (two) times daily. 12/08/20   [provider]  ?nicotine (NICODERM CQ - DOSED IN MG/24 HOURS) 14 mg/24hr patch Place 1 patch (14 mg total) onto the skin daily. 01/26/21 01/26/22  Roxan Hockey, MD  ?potassium chloride SA (KLOR-CON) 20 MEQ tablet Take 40 mEq by mouth every morning. 01/04/21   [provider]  ? ? ?Physical Exam: ?Vitals:  ? 06/21/21 0030 06/21/21 0045 06/21/21 0100 06/21/21 0115  ?BP: (!) 195/92 (!) 185/65 (!) 143/98 (!) 166/76  ?Pulse: 81 78 74 81  ?Resp: '14 16 12 14  '$ ?Temp:      ?TempSrc:      ?SpO2: 97% 99% 100% 98%  ?Weight:      ?Height:      ? ?Physical Exam ?Vitals and nursing note reviewed.  ?Constitutional:   ?   General: She is sleeping. She is not in acute distress. ?HENT:  ?   Head: Normocephalic and atraumatic.  ?Cardiovascular:  ?   Rate and Rhythm: Normal rate and regular rhythm.  ?   Heart sounds: Normal heart sounds.  ?Pulmonary:  ?   Effort: Pulmonary effort is normal.  ?   Breath sounds: Normal breath sounds.  ?Abdominal:  ?   Palpations: Abdomen is soft.  ?   Tenderness: There is  no abdominal tenderness.  ?Neurological:  ?   Mental Status: She is lethargic.  ? ? ? ?Data Reviewed: ?Relevant notes from primary care and specialist visits, past discharge summaries as available in EHR, including Care Everywhere. ?Prior diagnostic testing as pertinent to current admission diagnoses ?Updated medications and problem lists for reconciliation ?ED course, including vitals, labs, imaging, treatment and response to treatment ?Triage notes, nursing and pharmacy notes and ED provider's notes ?Notable results as noted in HPI ? ? ?Assessment and Plan: ?* Acute metabolic encephalopathy ?Suspect related to acute infection, pneumonia, UTI ?Treat acute infections ?Follow UDS ?We will keep n.p.o. ?Fall and aspiration precautions ? ?Aspiration pneumonia ([place]) ?Continue Unasyn.  Continue azithromycin ?Keep n.p.o. with  fall and aspiration precautions ? ?Urinary tract infection ?Started on Unasyn for possible aspiration which we will continue for UTI ?Follow cultures ? ?Acute CVA (cerebrovascular accident) ([place]) ?Likely incidental finding based on review of neurology note.  Neurologic deficit more consistent with encephalopathy ?Head CT shows a small hyperdense lesion, confirmed on CT ?Multiple chronic microhemorrhages on MRIs consistent with amyloid ?Neurology consult to follow. ?Routine stroke work-up ?  ? ?Hypertensive urgency ?Hold home antihypertensives of amlodipine and losartan to allow for permissive hypertension ? ?Uncontrolled type 2 diabetes mellitus with hyperglycemia, with long-term current use of insulin ([place]) ?Sliding scale insulin coverage ? ?Colitis on CT ?Continue Flagyl from the ED ? ?Hemiparesis affecting left side as late effect of cerebrovascular accident (CVA) ([place]) ?Left-sided upper extremity weakness from prior stroke.  Increase nursing assistance with transfers. ? ? ? ? ? ? ?Advance Care Planning:   Code Status: Prior  ? ?Consults: neurology, Dr Cheral Marker ? ?Family Communication:  none ? ?Severity of Illness: ?The appropriate patient status for this patient is INPATIENT. Inpatient status is judged to be reasonable and necessary in order to provide the required intensity of service to ensure the p

## 2021-06-21 NOTE — ED Notes (Addendum)
Report received from [place], [place]. Per previous RN will hold hypertensive medications at this time as recommended by MD. Will verify with oncoming MD. Pt in bed with eyes closed. RR equal and unlabored. Lights dimmed for pt comfort. Purewick and bed alarm in place.  ?

## 2021-06-21 NOTE — Progress Notes (Signed)
?   06/21/21 1342  ?Assess: MEWS Score  ?Temp 98.4 ?F (36.9 ?C)  ?BP (!) 150/113  ?Pulse Rate 68  ?Resp 16  ?Level of Consciousness Responds to Pain  ?SpO2 99 %  ?O2 Device Room Air  ?Assess: MEWS Score  ?MEWS Temp 0  ?MEWS Systolic 0  ?MEWS Pulse 0  ?MEWS RR 0  ?MEWS LOC 2  ?MEWS Score 2  ?MEWS Score Color Yellow  ?Assess: if the MEWS score is Yellow or Red  ?Were vital signs taken at a resting state? Yes  ?Focused Assessment No change from prior assessment  ?Does the patient meet 2 or more of the SIRS criteria? No  ?MEWS guidelines implemented *See Row Information* Yes  ?Treat  ?MEWS Interventions Other (Comment) ?(Pt responsive to pain only)  ?Pain Scale 0-10  ?Pain Score Asleep  ?Take Vital Signs  ?Increase Vital Sign Frequency  Yellow: Q 2hr X 2 then Q 4hr X 2, if remains yellow, continue Q 4hrs  ?Escalate  ?MEWS: Escalate Yellow: discuss with charge nurse/RN and consider discussing with provider and RRT  ?Notify: Charge Nurse/RN  ?Name of Charge Nurse/RN Notified Doloris Hall  ?Date Charge Nurse/RN Notified 06/21/21  ?Time Charge Nurse/RN Notified 1355  ?Document  ?Patient Outcome Other (Comment) ?(Continue to monitor)  ?Progress note created (see row info) Yes  ?Assess: SIRS CRITERIA  ?SIRS Temperature  0  ?SIRS Pulse 0  ?SIRS Respirations  0  ?SIRS WBC 0  ?SIRS Score Sum  0  ? ? ?

## 2021-06-21 NOTE — Assessment & Plan Note (Addendum)
Less likely pneumonia as procalcitonin remain negative. ?Discontinue Zithromax ?Continue Unasyn.   ?Keep n.p.o. with fall and aspiration precautions ?

## 2021-06-21 NOTE — Progress Notes (Signed)
OT Cancellation Note ? ?Patient Details ?Name: KRUPA STEGE ?MRN: [id number] ?DOB: 09-20-1950 ? ? ?Cancelled Treatment:    Reason Eval/Treat Not Completed: Fatigue/lethargy limiting ability to participate;Medical issues which prohibited therapy;Other (comment). OT order received, chart reviewed. Per care team, pt remains lethargic, and systolic BP still elevated. Will hold at this time and re-attempt at a later date/time as available.  ? ?Shara Blazing, M.S., OTR/L ?[facility] - [facility] ?Ascom: [phone] ?06/21/21, 10:41 AM ? ? ?

## 2021-06-21 NOTE — Progress Notes (Signed)
PT Cancellation Note ? ?Patient Details ?Name: Cheyenne [name] ?MRN: [id number] ?DOB: [birth year] ? ? ?Cancelled Treatment:    Reason Eval/Treat Not Completed: Other (comment). Per RN pt lethargic still this AM, and systolic BP still elevated. PT to re-attempt as able.  ? ? ?Lieutenant Diego PT, DPT ?10:05 AM,06/21/21 ? ?

## 2021-06-21 NOTE — Assessment & Plan Note (Addendum)
Likely incidental finding based on review of neurology note.  Neurologic deficit more consistent with encephalopathy ?Head CT shows a small hyperdense lesion, confirmed on CT ?Multiple chronic microhemorrhages on MRIs consistent with amyloid. ?Neurology consult to follow. ?Routine stroke work-up ?  ?

## 2021-06-21 NOTE — Hospital Course (Addendum)
Taken from H&P. ? ?Cheyenne [name] is a 71 y.o. female with medical history significant for DM, HTN, left hemiparesis from prior stroke, HLD,  who at baseline ambulates independently who was brought to the ED by EMS after her daughter found her sitting on the commode several hours after she was helped to the commode at 3 PM.  Patient had previously been complaining of weakness.  A code stroke was activated for NIHSS of 17.  Head CT showed a small hyperdense lesion.  No thrombolysis was given as patient was outside tPA window and due to likely alternative diagnosis.  Neurology telemetry consult was done and findings were found to be more consistent with toxic metabolic encephalopathy. ?Additional data review: BP in ED as high as 213/101 with otherwise normal vitals.  Blood work showed glucose of 255, VBG with PCO2 of 41, urinalysis consistent with UTI but labs otherwise unremarkable.  Chest x-ray showing developing right middle lobe infiltrate.  CT abdomen and pelvis with possible sigmoid colitis/diverticulitis, cystitis.  MRI brain as recommended by neurologist showed the following: ?IMPRESSION: ?1. Punctate focus of acute ischemia within the right parietal lobe. ?No hemorrhage or mass effect. ?2. 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 ?3. Moderate chronic microvascular ischemia and volume loss. ? ?Patient received Unasyn, azithromycin and Flagyl in ED.  Flagyl was discontinued as Unasyn can provide anaerobic coverage. ? ?Concern of UTI versus pneumonia.  Baseline procalcitonin negative.  UA with pyuria and urine cultures pending.  Unremarkable CBC.  Doubt it is pneumonia so we will discontinue azithromycin. ?Lipid panel without any significant normality, LDL of 74. ?UDS negative. ? ?4/19: Patient remained quite encephalopathic and only showing minimum response to sternal rub.  Protecting airway.  Not following any commands  today.  Unable to obtain swallow evaluation so we are keeping her n.p.o. until mental status improves. ?Blood pressure remained elevated, mostly above 200, added as needed labetalol and we will allow permissive hypertension up to 751 systolic. ?Patient also has an history of underlying dementia and cognitive impairment, per PCP note this is a struggle with altered mental status for a long time and they were recommending continue Depakote and olanzapine for now.  Her psychiatrist was recommending restarting Lexapro clonazepam due to aggressive behavior.  ? ?She was recently admitted at [facility] with bladder rupture s/p repair and now Foley is out and she is voiding on her own.  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  And she was discharged to rehab after that admission. ? ?Neurology was consulted-will appreciate their recommendations. ? ?4/20: Echocardiogram with normal EF and no other significant abnormality. ?EEG with with continuous generalized slowing/encephalopathy, nonspecific to etiology.  No seizure or epileptiform changes. ?Because of the concern of cerebral amyloid angiopathy and increased risk of ICH associated with it the neurology recommending discontinuing aspirin, Plavix and statin.  See full neurology consult note for detail.  Patient will need outpatient neurology evaluation for possible CAA as it can lead to dementia. ? ?CTA with unchanged small focus of hyperdensity in the posterior superior right frontal lobe suspicious for small amount of acute blood products, possibly related to underlying amyloid angiopathy. ?No emergent large vessel occlusion.  Please see full report. ?Urine cultures negative.  Procalcitonin remain negative. ? ?Patient became little more alert and following commands today.  Passed swallow evaluation and dysphagia 1 diet was ordered per recommendations. ? ?4/21:  Patient appears to be back to  her baseline.  Alert and oriented to self and place.  Eating and drinking okay.  PT/OT are recommending rehab.  Stable for discharge.  TOC is working on placement. ?Neurology recommending stopping aspirin and Plavix because of increased risk of hemorrhage with CVA.  She will need a outpatient neurology follow-up for further management. ?Urine cultures and procalcitonin remain negative, discontinuing antibiotics. ? ?4/22: Home dose of amlodipine was restarted, apparently patient stopped taking it. ?Also added Semglee for elevated CBG. ?Patient remained stable and at her baseline-waiting for SNF placement. ? ?4/23: Hydralazine added due to persistently elevated blood pressure. ? ?4/24: 1 extra dose of Zyprexa given for some agitation.  Patient appears little confused this morning and asking for a friend who lives around the corner. ?Insurance authorization for rehab is still pending. ? ?4/25: Patient remained stable and is being discharged to rehab for further management as recommended by our physical therapist. ?We discontinued aspirin and Plavix for concern of possible CAA.  Patient need a close follow-up with outpatient neurology for further recommendations. ? ?Patient will continue with current medications and follow-up with her providers. ?

## 2021-06-21 NOTE — Assessment & Plan Note (Signed)
Started on Unasyn for possible aspiration which we will continue for UTI ?Follow cultures ?

## 2021-06-21 NOTE — Assessment & Plan Note (Signed)
Left-sided upper extremity weakness from prior stroke.  Increase nursing assistance with transfers. ?

## 2021-06-21 NOTE — Progress Notes (Signed)
?   06/21/21 1530  ?Vitals  ?Temp  ?(Off the unit for EEG)  ? ? ?

## 2021-06-21 NOTE — Consult Note (Addendum)
?                    NEURO HOSPITALIST CONSULT NOTE  ? ?Requesting physician: Dr. Reesa Chew ? ?Reason for Consult: Right parietal lobe punctate cortically based acute infarction on MRI ? ?History obtained from:  Chart    ? ?HPI:                                                                                                                                         ? Cheyenne [name] is an 71 y.o. female with a PMHx of stroke with residual left arm weakness, DM, endometrial cancer, HTN and spinal stenosis who presented to the ED overnight as a Code Stroke which was called in th field by EMS for altered mental status. She had been found on the toilet at home altered, wearing 2 briefs and covered in feces. LKN was at 3 PM Tuesday, when her daughter helped her to the bathroom. Several hours later her daughter found her still sitting on the toilet in the condition described above. In the ED her BP was 190/93 with HR 70. She was essentially nonverbal and unable to follow commands on arrival. Teleneurology was consulted and it was determined that the patient was not a candidate for thrombolysis due to time criteria and overall presentation being most consistent with a toxic-metabolic encephalopathy. NIHSS was 17 in the context of her AMS.  ? ?EDP reviewed past records and noted that per the patient's most recent PCP note, it was documented that the patient does have some behavioral disturbance and is on olanzapine for this. At her baseline, she has agitated delirium at times. ? ?ED labs were overall reassuring; she had no leukocytosis; she was mildly hyperglycemic but there were no signs of DKA.  Her troponin was negative and there was no significant hypercarbia on VBG. She was empirically treated for a possible UTI. Ultimately was also diagnosed with pneumonia and colitis.  ? 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 Neurology was consulted to further evaluate.  ? ?Past Medical History:  ?Diagnosis Date  ? Diabetes mellitus without complication ([place])   ? Endometrial cancer ([place])   ? details unavailable. brachytherapy seeds within region of cervix on CT imaging 2019  ? HTN (hypertension)   ? Peripheral neuropathy   ? Spinal stenosis   ? Stroke [facility])   ? left sided arm weakness  ? ? ?Past Surgical History:  ?Procedure Laterality Date  ? APPENDECTOMY    ? BACK SURGERY  2016  ? laminectomy and facetectomy  ? ? ?History reviewed. No pertinent family history.          ? ?Social History:  reports that she quit smoking about 7 months ago. Her smoking use included cigarettes. She smoked an average of .5 packs per  day. She has never used smokeless tobacco. She reports that she does not currently use alcohol. She reports that she does not use drugs. ? ?Allergies  ?Allergen Reactions  ? Clonazepam   ?  Other reaction(s): Other (See Comments) ?irritability  ? Gabapentin   ?  Bad dreams  ? Pregabalin   ?  Drowsiness. Mind not clear.  ? Codeine Itching  ? ? ?MEDICATIONS:                                                                                                                     ?No current facility-administered medications on file prior to encounter.  ? ?Current Outpatient Medications on File Prior to Encounter  ?Medication Sig Dispense Refill  ? acetaminophen (TYLENOL) 325 MG tablet Take 650 mg by mouth every 6 (six) hours as needed for mild pain.    ? albuterol (VENTOLIN HFA) 108 (90 Base) MCG/ACT inhaler Inhale 1-2 puffs into the lungs every 6 (six) hours as needed for wheezing or shortness of breath. 8 g 0  ? alendronate (FOSAMAX) 70 MG tablet Take 70 mg by mouth once a week. Wednesday    ? aspirin EC 81 MG EC tablet Take 1 tablet (81 mg total) by mouth daily. Swallow whole. 30 tablet 11  ? atorvastatin (LIPITOR) 80 MG tablet Take 80 mg by mouth daily.    ? Cholecalciferol 25 MCG (1000 UT) tablet Take 1,000 Units by  mouth daily.    ? clopidogrel (PLAVIX) 75 MG tablet Take 1 tablet (75 mg total) by mouth daily. 30 tablet 0  ? divalproex (DEPAKOTE) 250 MG DR tablet Take 250 mg by mouth every morning.    ? divalproex (DEPAKOTE) 500 MG DR tablet Take 500 mg by mouth at bedtime.    ? Docusate Sodium (DSS) 100 MG CAPS Take 100 mg by mouth in the morning and at bedtime.    ? famotidine (PEPCID) 20 MG tablet Take 20 mg by mouth 2 (two) times daily.    ? glimepiride (AMARYL) 1 MG tablet Take 1 mg by mouth daily with breakfast.  5  ? insulin glargine (LANTUS) 100 UNIT/ML injection Inject 20-40 Units into the skin 2 (two) times daily in the am and at bedtime.. Inject 40 units into the skin in the morning and 20 units into the skin at bedtime.    ? loperamide (IMODIUM) 2 MG capsule Take 1 capsule (2 mg total) by mouth 3 (three) times daily as needed for diarrhea or loose stools. 30 capsule 0  ? loratadine (CLARITIN) 10 MG tablet Take 10 mg by mouth daily.    ? losartan (COZAAR) 50 MG tablet Take 50 mg by mouth 2 (two) times daily.    ? Melatonin 10 MG CAPS Take 10 mg by mouth at bedtime as needed.    ? NOVOLOG 100 UNIT/ML injection Inject into the skin.    ? OLANZapine zydis (ZYPREXA) 5 MG disintegrating tablet Place 2.5 mg under the tongue at bedtime.    ?  oxyCODONE-acetaminophen (PERCOCET/ROXICET) 5-325 MG tablet Take 1 tablet by mouth every 4 (four) hours as needed for pain.    ? polyethylene glycol powder (GLYCOLAX/MIRALAX) 17 GM/SCOOP powder Take 17 g by mouth in the morning and at bedtime. Dissolve in 4 to 8 ounces of coffee, tea, juice or soda and drink twice a day.    ? potassium chloride SA (KLOR-CON) 20 MEQ tablet Take 40 mEq by mouth every morning.    ? rOPINIRole (REQUIP) 0.25 MG tablet Take 0.25 mg by mouth at bedtime.    ? thiamine 100 MG tablet Take 100 mg by mouth daily.    ? amLODipine (NORVASC) 10 MG tablet Take 1 tablet (10 mg total) by mouth daily. (Patient not taking: Reported on 06/21/2021) 30 tablet 1  ? escitalopram  (LEXAPRO) 20 MG tablet Take 20 mg by mouth daily. (Patient not taking: Reported on 06/21/2021)    ? insulin NPH Human (NOVOLIN N) 100 UNIT/ML injection Inject 0.13 mLs (13 Units total) into the skin 2 (two) times daily at 8 am and 10 pm. (Patient not taking: Reported on 06/21/2021) 10 mL 11  ? nicotine (NICODERM CQ - DOSED IN MG/24 HOURS) 14 mg/24hr patch Place 1 patch (14 mg total) onto the skin daily. (Patient not taking: Reported on 06/21/2021) 30 patch 0  ? ? ?Scheduled: ?  stroke: early stages of recovery book   Does not apply Once  ? insulin aspart  0-15 Units Subcutaneous Q4H  ? ?Continuous: ? sodium chloride Stopped (06/21/21 2595)  ? ampicillin-sulbactam (UNASYN) IV Stopped (06/21/21 6387)  ? azithromycin    ? metronidazole    ? ? ? ?ROS:                                                                                                                                       ?Unable to obtain due to AMS.  ? ? ?Blood pressure (!) 228/113, pulse (!) 110, temperature 98.3 ?F (36.8 ?C), temperature source Oral, resp. rate (!) 24, height '5\' 4"'$  (1.626 m), weight 63.1 kg, SpO2 97 %. ? ? ?General Examination:                                                                                                      ? ?Physical Exam  ?HEENT-  [place]/AT. No neck stiffness.  ?Lungs- Respirations unlabored ?Extremities- No edema ? ?Neurological Examination ?Mental Status: Eyes are closed. Patient does not respond to name, but will exclaim to noxious stimuli and move  arms semipurposefully to sternal rub, as well as withdrawing legs. Otherwise lies in the bed with arms folded across chest and legs crossed. Does not follow any commands. Does not open eyes to any stimuli. Does not attempt to communicate.  ?Cranial Nerves: ?II: Does not blink to threat when eyelids are held open. PERRL.  ?III,IV, VI: Eyes are conjugate near the midline. Does not gaze towards or away from visual stimuli. Some roving eye movements are noted.  ?V: Resists eye  opening when eyelids are touched.  ?VII: Grimace is symmetric, facial light touch sensation normal bilaterally ?VIII: Unable to assess ?IX,X: When exclaiming, no hypophonia noted. ?XI: Head is midline ?XII: Does not

## 2021-06-21 NOTE — Progress Notes (Signed)
*  PRELIMINARY RESULTS* ?Echocardiogram ?2D Echocardiogram has been performed. ? ?Cheyenne [name] ?06/21/2021, 9:42 AM ?

## 2021-06-21 NOTE — Assessment & Plan Note (Addendum)
Patient with advanced dementia with behavioral changes and prior history of becoming encephalopathic on different occasions. ?Can be due to UTI, less likely pneumonia as procalcitonin remain negative. ?UA with pyuria, urine cultures pending. ?MRI with punctate acute ischemia within the right parietal lobe with should not be responsible for this change in mental status. ?-Keep her n.p.o. until she is more awake and able to take p.o. ?-Continue with Unasyn so I can provide coverage for concern of aspiration and UTI ?-Follow-up urine cultures ?Fall and aspiration precautions ?

## 2021-06-21 NOTE — Assessment & Plan Note (Addendum)
She was placed on Flagyl in ED for concern of colitis. ?Unasyn can provide anaerobic coverage so Flagyl was discontinued. ?-Continue to monitor ?

## 2021-06-22 ENCOUNTER — Inpatient Hospital Stay: Payer: Medicare HMO

## 2021-06-22 ENCOUNTER — Encounter: Payer: Self-pay | Admitting: Internal Medicine

## 2021-06-22 DIAGNOSIS — G9341 Metabolic encephalopathy: Secondary | ICD-10-CM | POA: Diagnosis not present

## 2021-06-22 LAB — GLUCOSE, CAPILLARY
Glucose-Capillary: 110 mg/dL — ABNORMAL HIGH (ref 70–99)
Glucose-Capillary: 113 mg/dL — ABNORMAL HIGH (ref 70–99)
Glucose-Capillary: 126 mg/dL — ABNORMAL HIGH (ref 70–99)
Glucose-Capillary: 130 mg/dL — ABNORMAL HIGH (ref 70–99)
Glucose-Capillary: 212 mg/dL — ABNORMAL HIGH (ref 70–99)
Glucose-Capillary: 272 mg/dL — ABNORMAL HIGH (ref 70–99)
Glucose-Capillary: 334 mg/dL — ABNORMAL HIGH (ref 70–99)

## 2021-06-22 LAB — URINE CULTURE: Culture: NO GROWTH

## 2021-06-22 LAB — PROCALCITONIN: Procalcitonin: <0.1 ng/mL

## 2021-06-22 IMAGING — CT CT ANGIO HEAD-NECK (W OR W/O PERF)
3 of 11 series · 8 of 35 positions shown · IV contrast (APPLIED)
Comparison: Brain MRI dated 1 day prior CT head [DATE]

CLINICAL DATA: Weakness, code stroke

EXAM:
CT ANGIOGRAPHY HEAD AND NECK
TECHNIQUE: Multidetector CT imaging of the head and neck was performed using
the standard protocol during bolus administration of intravenous
contrast. Multiplanar CT image reconstructions and MIPs were
obtained to evaluate the vascular anatomy. Carotid stenosis
measurements (when applicable) are obtained utilizing NASCET
criteria, using the distal internal carotid diameter as the
denominator.

[Series 9: cta head neck · axial · 0.48mm/px · z∈[-266,-142]mm · 2 of 185 slices shown]
[im 62/185  soft-tissue]
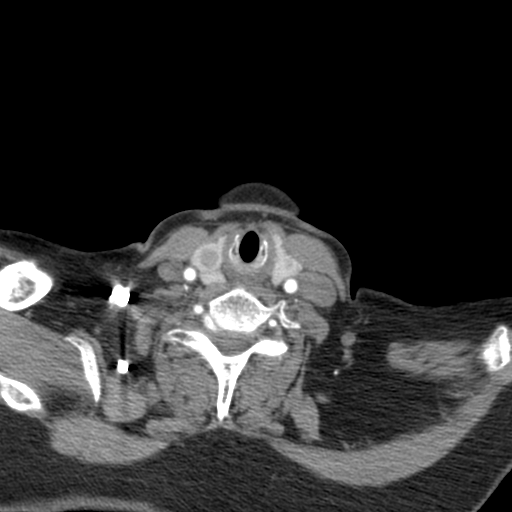
[im 123/185  soft-tissue]
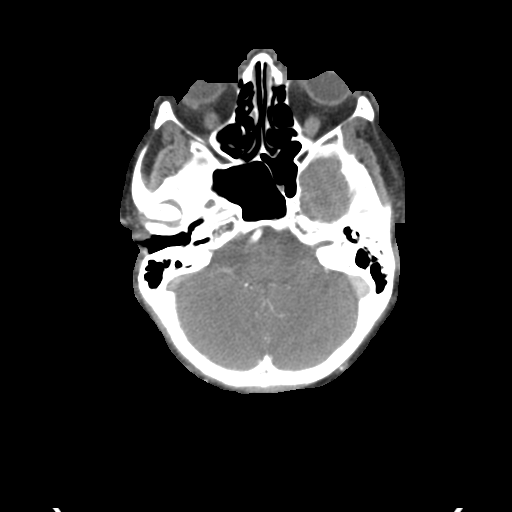

[Series 11: ax thin · axial · 0.51mm/px · z∈[-320,-72]mm · 5 of 365 slices shown]
[im 61/365  soft-tissue]
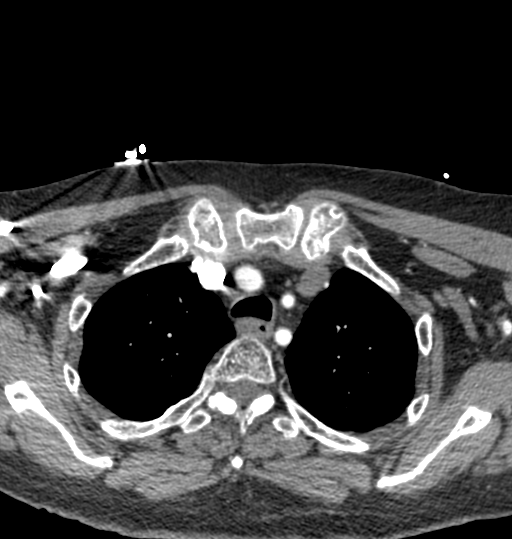
[im 122/365  bone]
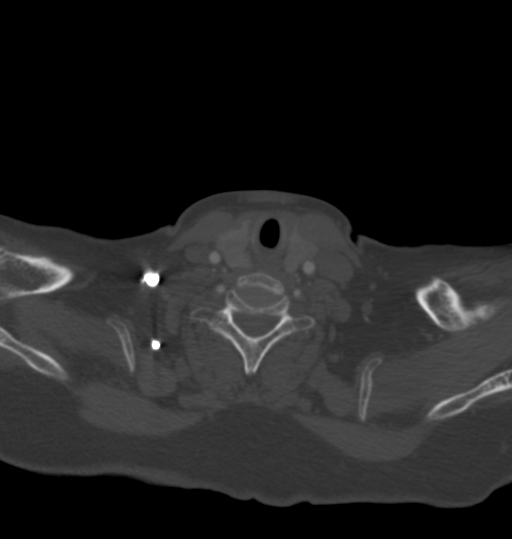
[im 183/365  soft-tissue]
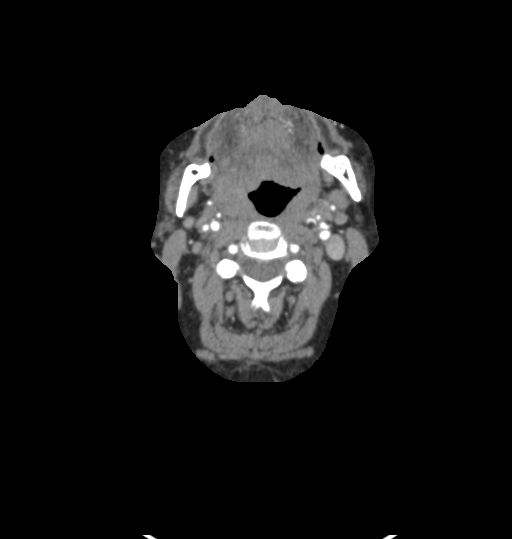
[im 243/365  bone]
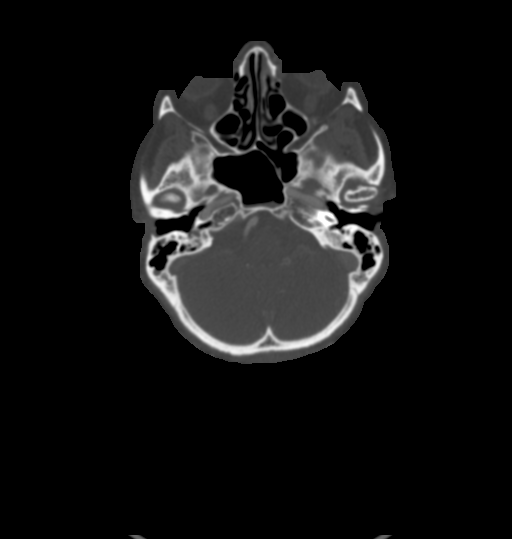
[im 304/365  soft-tissue]
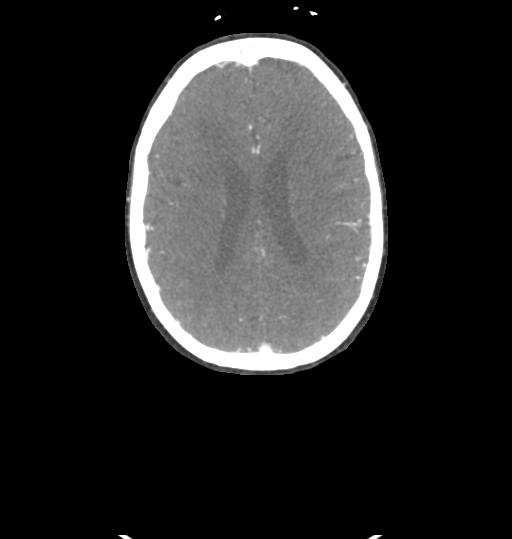

[Series 14: sagittal thin · sagittal · 0.54mm/px · 1 of 264 slices shown]
[im 160/264  soft-tissue]
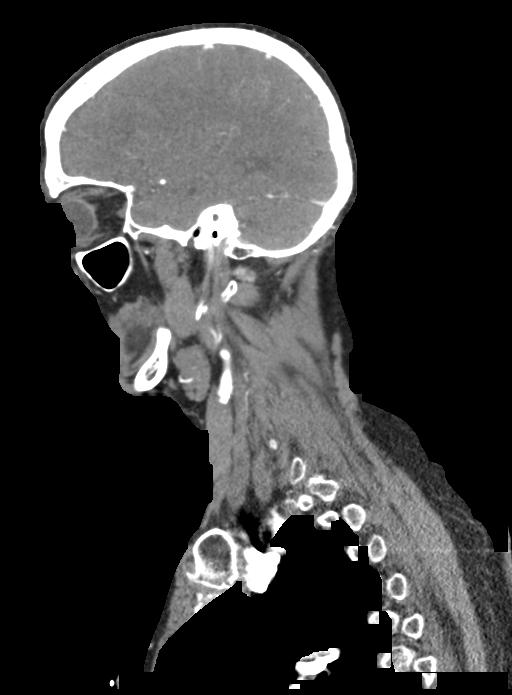

[8 of 35 positions shown; findings below may reference images not displayed]

RADIATION DOSE REDUCTION: This exam was performed according to the
departmental dose-optimization program which includes automated
exposure control, adjustment of the mA and/or kV according to
patient size and/or use of iterative reconstruction technique.

CONTRAST:  75mL OMNIPAQUE IOHEXOL 350 MG/ML SOLN
FINDINGS: CT HEAD FINDINGS

Brain: The known small infarct in the right parietal lobe is not
well seen on the current study. The small focus of hyperdensity in
the right superior frontal lobe is unchanged, indeterminate but felt
unlikely to reflect acute hemorrhage. There is no evidence of new
acute intracranial hemorrhage, extra-axial fluid collection, or
territorial infarct.

The ventricles are stable in size. A remote infarct in the right
corona radiata is unchanged. Additional confluent hypodensity
throughout the subcortical and periventricular white matter likely
reflecting advanced chronic white matter microangiopathy is also
unchanged.

There is no mass effect or midline shift.

Vascular: See below.

Skull: Normal. Negative for fracture or focal lesion.

Sinuses and orbits: There is trace layering fluid in the right
sphenoid sinus, unchanged. A right lens implant is noted. The globes
and orbits are otherwise unremarkable.

Other: None.

Review of the MIP images confirms the above findings

CTA NECK FINDINGS

Aortic arch: The imaged aortic arch is unremarkable. The origins of
the major branch vessels are patent. Subclavian arteries are patent
to the level imaged.

Right carotid system: The right common, internal, and external
carotid arteries are patent with mild plaque at the bifurcation.
There is no hemodynamically significant stenosis or occlusion. There
is no dissection or aneurysm.

Left carotid system: The left common, internal, and external carotid
arteries are patent with mild plaque at the bifurcation. There is no
hemodynamically significant stenosis or occlusion. There is no
dissection or aneurysm.

Vertebral arteries: The vertebral arteries are patent, without
hemodynamically significant stenosis or occlusion. There is no
dissection or aneurysm.

Skeleton: There is minimal degenerative change C5-C6. There is no
acute osseous abnormality or suspicious osseous lesion. There is no
visible canal hematoma.

Other neck: A few small subcentimeter thyroid nodules are noted, for
which no specific imaging follow-up is required. The soft tissues
are otherwise unremarkable.

Upper chest: There is moderate emphysema in the lung apices.

Review of the MIP images confirms the above findings

CTA HEAD FINDINGS

Anterior circulation: There is calcified plaque at the left
petrous/cavernous junction resulting in mild-to-moderate stenosis.
There is mixed plaque in the right intracranial ICA resulting in up
to mild stenosis.

The bilateral MCAs are patent

The bilateral ACAs are patent.

There is no aneurysm or AVM.

Posterior circulation: The bilateral V4 segments are patent. PICA is
identified bilaterally. The basilar artery is patent.

The bilateral PCAs are patent. A small right posterior communicating
artery is identified. The left posterior communicating artery is not
definitely seen.

There is no aneurysm or AVM.

Venous sinuses: Patent.

Anatomic variants: None.

Review of the MIP images confirms the above findings
IMPRESSION: 1. Unchanged small focus of hyperdensity in the posterosuperior
right frontal lobe again suspicious for a small amount of acute
blood products, possibly related to underlying amyloid angiopathy.
The known small infarct in the right parietal lobe is not well seen
on the current study.
2. No emergent large vessel occlusion.
3. Mild calcified plaque at the carotid bifurcations without
hemodynamically significant stenosis.
4. Calcified plaque in the intracranial ICAs resulting in up to
mild-to-moderate stenosis of the left petrous/cavernous junction.
Otherwise, patent intracranial vasculature with no proximal
high-grade stenosis or occlusion.

## 2021-06-22 MED ORDER — PROSOURCE PLUS PO LIQD
30.0000 mL | Freq: Three times a day (TID) | ORAL | Status: DC
  Administered 2021-06-22 – 2021-06-26 (×10): 30 mL via ORAL
  Filled 2021-06-22: qty 30

## 2021-06-22 MED ORDER — ADULT MULTIVITAMIN W/MINERALS CH
1.0000 | ORAL_TABLET | Freq: Every day | ORAL | Status: DC
Start: 2021-06-22 — End: 2021-06-27
  Administered 2021-06-22 – 2021-06-27 (×6): 1 via ORAL
  Filled 2021-06-22 (×6): qty 1

## 2021-06-22 MED ORDER — IOHEXOL 350 MG/ML SOLN
75.0000 mL | Freq: Once | INTRAVENOUS | Status: AC | PRN
  Administered 2021-06-22: 75 mL via INTRAVENOUS

## 2021-06-22 NOTE — TOC Initial Note (Addendum)
Transition of Care (TOC) - Initial/Assessment Note  ? ? ?Patient Details  ?Name: Cheyenne [name] ?MRN: [id number] ?Date of Birth: [birth year] ? ?Transition of Care (TOC) CM/SW Contact:    ?Candie Chroman, LCSW ?Phone Number: ?06/22/2021, 12:54 PM ? ?Clinical Narrative:   Patient only oriented to self. Received call back from granddaughter. CSW introduced role and explained that PT recommendations would be discussed. Granddaughter is agreeable to SNF placement. Patient independent prior to admission. She lives with her granddaughter and three great-grandchildren. Will follow up with bed offers once available. No further concerns. CSW encouraged patient's granddaughter to contact CSW as needed. CSW will continue to follow patient and her granddaughter for support and facilitate discharge to SNF once medically stable.      ? ?3:00 pm: Patient has one bed offer from [facility]. All others in [place] declined. Granddaughter is aware. She would like to research and tour facility tomorrow.      ? ?Expected Discharge Plan: [place] ?Barriers to Discharge: Continued Medical Work up, Ship broker ? ? ?Patient Goals and CMS Choice ?  ?  ?  ? ?Expected Discharge Plan and Services ?Expected Discharge Plan: [place] ?  ?  ?Post Acute Care Choice: [place] ?Living arrangements for the past 2 months: [place] ?                ?  ?  ?  ?  ?  ?  ?  ?  ?  ?  ? ?Prior Living Arrangements/Services ?Living arrangements for the past 2 months: [place] ?Lives with:: Relatives (Granddaughter and 3 great-grandchildren.) ?Patient language and need for interpreter reviewed:: Yes ?Do you feel safe going back to the place where you live?: Yes      ?Need for Family Participation in Patient Care: Yes (Comment) ?Care giver support system in place?: Yes (comment) ?Current home services: Home OT, Home PT (Home Speech therapy, social work) ?Criminal  Activity/Legal Involvement Pertinent to Current Situation/Hospitalization: No - Comment as needed ? ?Activities of Daily Living ?Home Assistive Devices/Equipment: Gilford Rile (specify type), Dentures (specify type), Eyeglasses, Shower chair without back ?ADL Screening (condition at time of admission) ?Patient's cognitive ability adequate to safely complete daily activities?: No ? ?Permission Sought/Granted ?Permission sought to share information with : Facility Sport and exercise psychologist, Family Supports ?  ? Share Information with NAME: Lanna Poche ? Permission granted to share info w AGENCY: SNF's ? Permission granted to share info w Relationship: Granddaughter ? Permission granted to share info w Contact Information: [phone] ? ?Emotional Assessment ?Appearance:: Appears stated age ?Attitude/Demeanor/Rapport: Unable to Assess ?Affect (typically observed): Unable to Assess ?Orientation: : Oriented to Self ?Alcohol / Substance Use: Not Applicable ?Psych Involvement: No (comment) ? ?Admission diagnosis:  Urinary tract infection without hematuria, site unspecified [N39.0] ?Altered mental status, unspecified altered mental status type [R41.82] ?Acute metabolic encephalopathy [O24.23] ?Patient Active Problem List  ? Diagnosis Date Noted  ? Acute metabolic encephalopathy 53/61/4431  ? Hypertensive urgency 06/21/2021  ? Diabetes mellitus without complication ([place])   ? Hemiparesis affecting left side as late effect of cerebrovascular accident (CVA) ([place])   ? Aspiration pneumonia ([place])   ? Urinary tract infection 02/02/2021  ? 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 Protein-calorie malnutrition, severe 11/18/2020  ? Colitis on CT 11/10/2020  ? Abdominal pain 11/10/2020  ? Nausea & vomiting 11/10/2020  ? AKI (acute kidney injury) ([place]) 11/10/2020  ?  Hyperglycemia due to diabetes mellitus ([place]) 11/10/2020  ? Leukocytosis 11/10/2020  ? Mixed hyperlipidemia 11/10/2020  ?  Tobacco use 11/10/2020  ? Asthma 11/10/2020  ? SIRS (systemic inflammatory response syndrome) ([place]) 11/10/2020  ? Restless leg syndrome 11/10/2020  ? Vitamin D deficiency 11/10/2020  ? Enteritis 11/10/2020  ? Acute CVA (cerebrovascular accident) ([place])   ? Generalized weakness 10/25/2020  ? Essential hypertension 10/25/2020  ? Uncontrolled type 2 diabetes mellitus with hyperglycemia, with long-term current use of insulin ([place]) 10/25/2020  ? Psychiatric problem 10/25/2020  ? ?PCP:  Toni Arthurs, PA ?Pharmacy:   ?[place], [place] [facility] [place] ?[address] ?[place] [address] ?Phone: [phone] Fax: [phone] ? ?[place], [place] ?[address] ?[place] [address] ?Phone: [phone] Fax: [phone] ? ? ? ? ?Social Determinants of Health (SDOH) Interventions ?  ? ?Readmission Risk Interventions ? ?  02/03/2021  ? 11:22 AM 01/25/2021  ?  4:22 PM 11/14/2020  ?  4:33 PM  ?Readmission Risk Prevention Plan  ?Medication Screening  Complete   ?Transportation Screening Complete Complete   ?Home Care Screening     ?Medication Review (RN CM)     ?[place] or Home Care Consult Complete    ?Social Work Consult for [place] Planning/Counseling Complete    ?Palliative Care Screening Not Applicable    ?Medication Review Press photographer) Complete    ?  ? Information is confidential and restricted. Go to Review Flowsheets to unlock data.  ? ? ? ?

## 2021-06-22 NOTE — Assessment & Plan Note (Signed)
Urine cultures are negative. ?We will continue with Unasyn for concern of some colitis for another day. ?

## 2021-06-22 NOTE — Progress Notes (Signed)
Initial Nutrition Assessment ? ?DOCUMENTATION CODES:  ? ?Not applicable ? ?INTERVENTION:  ? ?-30 ml Prosource Plus TID, each supplement provides 100 kcals and 15 grams protein ?-Hormel Shake TID with meals, each supplement provides 520 kcals and 22 grams protein ?-MVI with minerals daily ?-Feeding assistance with meals ? ?NUTRITION DIAGNOSIS:  ? ?Inadequate oral intake related to lethargy/confusion as evidenced by per patient/family report. ? ?GOAL:  ? ?Patient will meet greater than or equal to 90% of their needs ? ?MONITOR:  ? ?PO intake, Supplement acceptance, Diet advancement, Labs, Weight trends, Skin, I & O's ? ?REASON FOR ASSESSMENT:  ? ?Low [place] ?  ? ?ASSESSMENT:  ? ?Cheyenne [name] is a 71 y.o. female with medical history significant for DM, HTN, left hemiparesis from prior stroke, HLD,  who at baseline ambulates independently who was brought to the ED by EMS after her daughter found her sitting on the commode several hours after she was helped to the commode at 3 PM.  Patient had previously been complaining of weakness.  A code stroke was activated for NIHSS of 17.  Head CT showed a small hyperdense lesion.  No thrombolysis was given as patient was outside tPA window and due to likely alternative diagnosis.  Neurology telemetry consult was done and findings were found to be more consistent with toxic metabolic encephalopathy. ? ?Pt admitted with acute metabolic encephalopathy and aspiration pneumonia.  ? ?4/19- s/p BSE- recommend NPO ?4/20- s/p BSE- advanced to dysphagia 1 diet with thin liquids ? ?Reviewed I/O's: -200 ml x 24 hours ? ?UOP: 400 ml x 24 hours ? ?Pt resting quietly at time of visit. She did not respond to voice or touch. No meal completion data available at this time.  ? ?Reviewed wt hx; no wt loss over the past 5 months.  ? ?Per TOC notes, plan to d/c to SNF once medically stable.  ?  ?Medications reviewed and include 0.9% sodium chloride infusion @ 75 ml/hr.  ? ?Lab Results  ?Component  Value Date  ? HGBA1C 8.4 (H) 06/21/2021  ? PTA DM medications are 1 mg amaryl daily, 40 units insulin glargine daily, 20 units insulin glargine daily, and 13 units insulin NPH BID. Per ADA's Standards of Medical Care of Diabetes, glycemic targets for older adults who have multiple co-morbidities, cognitive impairments, and functional dependence should be less stringent (Hgb A1c <8.0-8.5).   ? ?Labs reviewed: CBGS: 161-096 (inpatient orders for glycemic control are 0-15 units insulin aspart every 4 hours).   ? ?NUTRITION - FOCUSED PHYSICAL EXAM: ? ?Flowsheet Row Most Recent Value  ?Orbital Region No depletion  ?Upper Arm Region No depletion  ?Thoracic and Lumbar Region No depletion  ?Buccal Region No depletion  ?Temple Region No depletion  ?Clavicle Bone Region No depletion  ?Clavicle and Acromion Bone Region No depletion  ?Scapular Bone Region No depletion  ?Dorsal Hand No depletion  ?Patellar Region Mild depletion  ?Anterior Thigh Region Mild depletion  ?Posterior Calf Region Mild depletion  ?Edema (RD Assessment) None  ?Hair Reviewed  ?Eyes Reviewed  ?Mouth Reviewed  ?Skin Reviewed  ?Nails Reviewed  ? ?  ? ? ?Diet Order:   ?Diet Order   ? ?       ?  DIET - DYS 1 Room service appropriate? No; Fluid consistency: Thin  Diet effective now       ?  ? ?  ?  ? ?  ? ? ?EDUCATION NEEDS:  ? ?Not appropriate for education at this time ? ?  Skin:  Skin Assessment: Reviewed RN Assessment ? ?Last BM:  Unknown ? ?Height:  ? ?Ht Readings from Last 1 Encounters:  ?06/20/21 '5\' 4"'$  (1.626 m)  ? ? ?Weight:  ? ?Wt Readings from Last 1 Encounters:  ?06/20/21 63.1 kg  ? ? ?Ideal Body Weight:  54.5 kg ? ?BMI:  Body mass index is 23.88 kg/m?. ? ?Estimated Nutritional Needs:  ? ?Kcal:  1600-1800 ? ?Protein:  80-95 grams ? ?Fluid:  > 1.6 L ? ? ? ?Loistine Chance, RD, LDN, CDCES ?Registered Dietitian II ?Certified Diabetes Care and Education Specialist ?Please refer to [facility] for RD and/or RD on-call/weekend/after hours pager  ?

## 2021-06-22 NOTE — Assessment & Plan Note (Signed)
She was placed on Flagyl in ED for concern of colitis. ?Unasyn can provide anaerobic coverage so Flagyl was discontinued. ?-Continue to monitor ?

## 2021-06-22 NOTE — Assessment & Plan Note (Signed)
Likely incidental finding based on review of neurology note.  Neurologic deficit more consistent with encephalopathy ?Head CT shows a small hyperdense lesion, confirmed on CT ?Multiple chronic microhemorrhages on MRIs consistent with amyloid. ?Neurology was consulted-appreciate their recommendations. ?Patient completed the stroke work-up ?Neurology stopped aspirin, Plavix and statin due to increased risk of hemorrhage with CAA which is evident on her images. ?Patient will need outpatient neurology follow-up for further management. ?PT/OT are recommending SNF. ? ?  ?

## 2021-06-22 NOTE — Assessment & Plan Note (Signed)
Less likely pneumonia as procalcitonin remain negative. ?Discontinue Zithromax ?Continue Unasyn for 1 more day ?Passed swallow evaluation- started on dysphagia 1 diet ?

## 2021-06-22 NOTE — Evaluation (Signed)
Occupational Therapy Evaluation ?Patient Details ?Name: Cheyenne [name] ?MRN: [id number] ?DOB: 11-Nov-1950 ?Today's Date: 06/22/2021 ? ? ?History of Present Illness Pt is a 25 female that presented to ED for AMS, found on commode by family. Imaging showed punctate focus of acute ischemia within the right parietal lobe. No hemorrhage or mass effect, innumerable chronic microhemorrhages in a predominantly peripheral distribution, consistent with cerebral amyloid angiopathy. PMH of  DM, HTN, left hemiparesis from prior stroke, HLD, behavioral distrubance, endometrial cancer.  ? ?Clinical Impression ?  ?Pt seen as PT/OT co-evaluation to maximize pt/therapist safety and pt's performance. Patient wakes to voice and touch, intermittent reminders to keep eyes open. Pt poor historian, did report living with her granddaughter and using a SPC at baseline, PLOF gathered from last admission.Pt requires repeated simple cues for initiating and proceeding with tasks, slow processing,  ?  ?She was able to follow simple one step commands inconsistently with repetition and extended time. She performed supine <> sit with modA, tactile and verbal cues to initiate movement. She was able to sit for several minutes with fair balance. Sit <> stand twice, modA-minAx2 for safety with RW. She was able to take several steps to the left with minA and PT assist with RW.  Pt could start simple grooming tasks while seated EOB, requires MOD A for LB ADL tasks from seated position. Overall the patient demonstrated deficits that impede the patient's ADL/IADL/mobility and safety. Pt would benefit from skilled OT services to maximize safety and return to PLOF.Marland Kitchen Recommendation at this time is SNF due to decline in functional status and level of assistance needed. ?   ? ?Recommendations for follow up therapy are one component of a multi-disciplinary discharge planning process, led by the attending physician.  Recommendations may be updated based on patient  status, additional functional criteria and insurance authorization.  ? ?Follow Up Recommendations ? Skilled nursing-short term rehab (<3 hours/day)  ?  ?Assistance Recommended at Discharge Frequent or constant Supervision/Assistance  ?Patient can return home with the following A little help with walking and/or transfers;A lot of help with bathing/dressing/bathroom;Assistance with cooking/housework;Assist for transportation;Help with stairs or ramp for entrance;Direct supervision/assist for medications management;Direct supervision/assist for financial management ? ?  ?Functional Status Assessment ? Patient has had a recent decline in their functional status and demonstrates the ability to make significant improvements in function in a reasonable and predictable amount of time.  ?Equipment Recommendations ? BSC/3in1  ?  ?Recommendations for Other Services   ? ? ?  ?Precautions / Restrictions Precautions ?Precautions: Fall ?Restrictions ?Weight Bearing Restrictions: No  ? ?  ? ?Mobility Bed Mobility ?Overal bed mobility: Needs Assistance ?Bed Mobility: Supine to Sit, Sit to Supine ?  ?  ?Supine to sit: Mod assist, +2 for safety/equipment ?Sit to supine: Mod assist, +2 for safety/equipment ?  ?  ?  ? ?Transfers ?Overall transfer level: Needs assistance ?Equipment used: Rolling walker (2 wheels) ?Transfers: Sit to/from Stand ?Sit to Stand: Min assist, +2 safety/equipment, Mod assist ?  ?  ?  ?  ?  ?General transfer comment: VC and physical assist to place hands on RW ?  ? ?  ?Balance Overall balance assessment: Needs assistance ?Sitting-balance support: Feet supported ?Sitting balance-Leahy Scale: Good ?  ?  ?Standing balance support: Reliant on assistive device for balance, Bilateral upper extremity supported ?Standing balance-Leahy Scale: Poor ?  ?  ?  ?  ?  ?  ?  ?  ?  ?  ?  ?  ?   ? ?  ADL either performed or assessed with clinical judgement  ? ?ADL   ?  ?  ?  ?  ?  ?  ?  ?  ?  ?  ?  ?  ?  ?  ?  ?  ?  ?  ?  ?General  ADL Comments: In sitting EOB, pt able to perform grooming tasks with MOD-MAX A (washes her R side of her face, attempts to comb L side of hair but doesn't reach high enough despite visual cues from mirror and verbal cues, MIN A +2 for ADL transfers and lateral steps EOB with VC for RW mgt. Will initiate attempts to doff socks while seated EOB but then doesn't follow through requiring repeated simple cues to initiate/continue but ultimately unable.  ? ? ? ?Vision   ?Additional Comments: Difficult to assess 2/2 cognition, intermittent nystagmus noted  ?   ?Perception   ?  ?Praxis   ?  ? ?Pertinent Vitals/Pain Pain Assessment ?Pain Assessment: Faces ?Faces Pain Scale: No hurt ?Pain Intervention(s): Monitored during session  ? ? ? ?Hand Dominance Right ?  ?Extremity/Trunk Assessment Upper Extremity Assessment ?Upper Extremity Assessment: Generalized weakness;Difficult to assess due to impaired cognition ?  ?Lower Extremity Assessment ?Lower Extremity Assessment: Generalized weakness;Difficult to assess due to impaired cognition ?  ?Cervical / Trunk Assessment ?Cervical / Trunk Assessment: Kyphotic ?  ?Communication   ?  ?Cognition Arousal/Alertness: Lethargic ?Behavior During Therapy: Flat affect ?Overall Cognitive Status: No family/caregiver present to determine baseline cognitive functioning ?  ?  ?  ?  ?  ?  ?  ?  ?  ?  ?  ?  ?  ?  ?  ?  ?General Comments: pt oriented to self, flat affect throughout. able to follow one step commands ~70% of the time with repetition and extended time ?  ?  ?General Comments    ? ?  ?Exercises   ?  ?Shoulder Instructions    ? ? ?Home Living Family/patient expects to be discharged to:: Private residence ?Living Arrangements: Other relatives ?Available Help at Discharge: Family;Available 24 hours/day;Available PRN/intermittently;Other (Comment) (pt reported living with granddaughter) ?Type of Home: House ?Home Access: Stairs to enter ?Entrance Stairs-Number of Steps: 4 ?Entrance  Stairs-Rails: Right ?Home Layout: One level ?  ?  ?Bathroom Shower/Tub: Walk-in shower ?  ?Bathroom Toilet: Standard ?Bathroom Accessibility: Yes ?  ?Home Equipment: Conservation officer, nature (2 wheels);Cane - single point;Shower seat ?  ?Additional Comments: information from previous admission, patient is poor historian ?  ? ?  ?Prior Functioning/Environment Prior Level of Function : Needs assist ?  ?  ?  ?  ?  ?  ?Mobility Comments: Independent/Modified Independent household ambulator, does not drive due to poor vision, uses RW occasionally. ?ADLs Comments: Assisted by family and home nurse as needed. ?  ? ?  ?  ?OT Problem List: Decreased strength;Decreased cognition;Decreased safety awareness;Decreased knowledge of use of DME or AE;Impaired balance (sitting and/or standing) ?  ?   ?OT Treatment/Interventions: Self-care/ADL training;Therapeutic exercise;Therapeutic activities;DME and/or AE instruction;Patient/family education;Balance training  ?  ?OT Goals(Current goals can be found in the care plan section) Acute Rehab OT Goals ?Patient Stated Goal: get better ?OT Goal Formulation: With patient ?Time For Goal Achievement: 07/06/21 ?Potential to Achieve Goals: Fair ?ADL Goals ?Pt Will Perform Grooming: sitting;with supervision;with min assist ?Pt Will Transfer to Toilet: bedside commode;ambulating;with min guard assist ?Pt Will Perform Toileting - Clothing Manipulation and hygiene: sitting/lateral leans;with supervision ?Additional ADL Goal #1: Pt will complete  seated bath with MOD A and  VC for safety.  ?OT Frequency: Min 2X/week ?  ? ?Co-evaluation PT/OT/SLP Co-Evaluation/Treatment: Yes ?Reason for Co-Treatment: Necessary to address cognition/behavior during functional activity;For patient/therapist safety;To address functional/ADL transfers ?PT goals addressed during session: Mobility/safety with mobility;Balance ?OT goals addressed during session: ADL's and self-care;Strengthening/ROM;Proper use of Adaptive equipment  and DME ?  ? ?  ?AM-PAC OT "6 Clicks" Daily Activity     ?Outcome Measure Help from another person eating meals?: A Little ?Help from another person taking care of personal grooming?: A Lot ?Help from another per

## 2021-06-22 NOTE — Progress Notes (Signed)
Speech Language Pathology Treatment:    ?Patient Details ?Name: Cheyenne [name] ?MRN: [id number] ?DOB: 08/27/1950 ?Today's Date: 06/22/2021 ?Time: [phone] ?SLP Time Calculation (min) (ACUTE ONLY): 20 min ? ?Assessment / Plan / Recommendation ?Clinical Impression ? Pt seen for clinical swallowing evaluation. Pt received sleeping in room. Pt initially required max verbal/visual/tactile cues (e.g. repositioning, lights turn on, wet washcloth) to maintain LOA. PT assisted with repositioning prior to POs. R mitt donned upon SLP entrance to room. AMS evident. Speech c/b low volume mumbling. Cleared with RN.  ? ?Pt given trials of ice chips (x5), thin liquids (via tsp, cup, straw; ~6 oz totals), and puree (2 oz). Pt presents with s/sx at least a mild oral dysphagia c/b intermittent, delayed oral transit of ice chips and pureed. Pharyngeal swallow appeared [facility] per clinical assessment. No overt s/sx pharyngeal dysphagia across trials. No change to vocal quality across trials. Solid trials deferred due to pt's clinical presentation, oral s/sx, and AMS.  ? ?Recommend cautious initiation of pureed diet with thin liquids and safe swallowing strategies/aspiration precautions as outlined below.  ? ?Pt is at increased risk for aspiration/aspiration PNA due to dental status, mental status, cognitive-linguistic deficits, need for assistance with feeding, and medical comorbidities.  ? ?SLP to f/u per POC for diet tolerance and trials of upgraded textures as appropriate. ? ?RN made aware of results, recommendations, and SLP. White board updated with diet recommendations and safe swallowing strategies/aspiration precautions for reinforcement of content by medical team. ? ?   ?HPI HPI: Pt is a 71 y.o. female with medical history significant for DM, HTN, left hemiparesis from prior stroke, HLD,  who at baseline ambulates independently who was brought to the ED by EMS after her daughter found her sitting on the commode several hours after  she was helped to the commode at 3 PM.  Patient had previously been complaining of weakness.  A code stroke was activated for NIHSS of 17.  Head CT showed a small hyperdense lesion.  No thrombolysis was given as patient was outside tPA window and due to likely alternative diagnosis.  Neurology telemetry consult was done and findings were found to be more consistent with toxic metabolic encephalopathy.  Head CT shows a small hyperdense lesion. no thrombolysis since patient is outside of treatment time window and alternative diagnosis. Patient signs and symptoms are consistent with toxic metabolic encephalopathy.  MRI: Punctate focus of acute ischemia within the right parietal lobe.  No hemorrhage or mass effect.  2. Innumerable chronic microhemorrhages in a predominantly  peripheral distribution, consistent with cerebral amyloid  angiopathy.  3. Moderate chronic microvascular ischemia and volume loss.  CXR: Interstitial prominence in the right lung, most pronounced in the  right lung base. This could reflect early infiltrate. ?  ?   ?SLP Plan ? Continue with current plan of care ? ?  ?  ?Recommendations for follow up therapy are one component of a multi-disciplinary discharge planning process, led by the attending physician.  Recommendations may be updated based on patient status, additional functional criteria and insurance authorization. ?  ? ?Recommendations  ?Diet recommendations: Dysphagia 1 (puree);Thin liquid ?Medication Administration: Crushed with puree ?Supervision: Full supervision/cueing for compensatory strategies;Staff to assist with self feeding ?Compensations: Minimize environmental distractions;Slow rate;Small sips/bites (check for oral clearance between bites and prior to leaving room) ?Postural Changes and/or Swallow Maneuvers: Seated upright 90 degrees;Upright 30-60 min after meal  ?   ?    ?   ? ? ? ? Oral Care  Recommendations: Oral care QID;Staff/trained caregiver to provide oral care ?Follow Up  Recommendations: Acute inpatient rehab (3hours/day) ?Assistance recommended at discharge: Frequent or constant Supervision/Assistance ?SLP Visit Diagnosis: Dysphagia, oropharyngeal phase (R13.12) ?Plan: Continue with current plan of care ? ? ? ? ?  ?  ? ?[name], M.S., CCC-SLP ?Speech-Language Pathologist ?[facility] ?[phone])  ? ?[name] ? ?06/22/2021, 10:55 AM ?

## 2021-06-22 NOTE — Evaluation (Signed)
Physical Therapy Evaluation ?Patient Details ?Name: Cheyenne [name] ?MRN: [id number] ?DOB: [birth year] ?Today's Date: 06/22/2021 ? ?History of Present Illness ? Pt is a 58 female that presented to ED for AMS, found on commode by family. Imaging showed punctate focus of acute ischemia within the right parietal lobe. No hemorrhage or mass effect, innumerable chronic microhemorrhages in a predominantly peripheral distribution, consistent with cerebral amyloid angiopathy. PMH of  DM, HTN, left hemiparesis from prior stroke, HLD, behavioral distrubance, endometrial cancer. ?  ?Clinical Impression ? Patient wakes to voice and touch, intermittent reminders to keep eyes open. Pt poor historian, did report living with her granddaughter and using a SPC at baseline, PLOF gathered from last admission. ? ?Pt seen as PT/OT co-evaluation to maximize pt/therapist safety as well as function. She was able to follow simple one step commands inconsistently with repetition and extended time. She performed supine <> sit with modA, tactile and verbal cues to initiate movement. She was able to sit for several minutes with fair balance. Sit <> stand twice, modA-minAx2 for safety with RW. She was able to take several steps to the left with minA and PT assist with RW.  Overall the patient demonstrated deficits (see "PT Problem List") that impede the patient's functional abilities, safety, and mobility and would benefit from skilled PT intervention. Recommendation at this time is SNF due to decline in functional status and level of assistance needed. ?   ? ?Recommendations for follow up therapy are one component of a multi-disciplinary discharge planning process, led by the attending physician.  Recommendations may be updated based on patient status, additional functional criteria and insurance authorization. ? ?Follow Up Recommendations Skilled nursing-short term rehab (<3 hours/day) ? ?  ?Assistance Recommended at Discharge Frequent or constant  Supervision/Assistance  ?Patient can return home with the following ? Two people to help with walking and/or transfers;Two people to help with bathing/dressing/bathroom;Assistance with feeding;Assist for transportation;Direct supervision/assist for financial management;Assistance with cooking/housework;Direct supervision/assist for medications management;Help with stairs or ramp for entrance ? ?  ?Equipment Recommendations Other (comment) (TBD)  ?Recommendations for Other Services ?    ?  ?Functional Status Assessment Patient has had a recent decline in their functional status and demonstrates the ability to make significant improvements in function in a reasonable and predictable amount of time.  ? ?  ?Precautions / Restrictions Precautions ?Precautions: Fall ?Restrictions ?Weight Bearing Restrictions: No  ? ?  ? ?Mobility ? Bed Mobility ?Overal bed mobility: Needs Assistance ?Bed Mobility: Supine to Sit, Sit to Supine ?  ?  ?Supine to sit: Mod assist, +2 for safety/equipment ?Sit to supine: Mod assist, +2 for safety/equipment ?  ?  ?  ? ?Transfers ?Overall transfer level: Needs assistance ?Equipment used: Rolling walker (2 wheels) ?Transfers: Sit to/from Stand ?Sit to Stand: Min assist, +2 safety/equipment, Mod assist ?  ?  ?  ?  ?  ?  ?  ? ?Ambulation/Gait ?Ambulation/Gait assistance: Min assist ?Gait Distance (Feet): 2 Feet ?Assistive device: Rolling walker (2 wheels) ?  ?  ?  ?  ?General Gait Details: step by step cueing, assist with RW from PT ? ?Stairs ?  ?  ?  ?  ?  ? ?Wheelchair Mobility ?  ? ?Modified Rankin (Stroke Patients Only) ?  ? ?  ? ?Balance Overall balance assessment: Needs assistance ?Sitting-balance support: Feet supported ?Sitting balance-Leahy Scale: Good ?  ?  ?Standing balance support: Reliant on assistive device for balance, Bilateral upper extremity supported ?Standing balance-Leahy Scale: Poor ?  ?  ?  ?  ?  ?  ?  ?  ?  ?  ?  ?  ?   ? ? ? ?  Pertinent Vitals/Pain Pain Assessment ?Pain  Assessment: Faces ?Faces Pain Scale: No hurt  ? ? ?Home Living Family/patient expects to be discharged to:: Private residence ?Living Arrangements: Other relatives ?Available Help at Discharge: Family;Available 24 hours/day;Available PRN/intermittently;Other (Comment) (pt reported living with granddaughter) ?Type of Home: House ?Home Access: Stairs to enter ?Entrance Stairs-Rails: Right ?Entrance Stairs-Number of Steps: 4 ?  ?Home Layout: One level ?Home Equipment: Conservation officer, nature (2 wheels);Cane - single point;Shower seat ?Additional Comments: information from previous admission, patient is poor historian  ?  ?Prior Function Prior Level of Function : Needs assist ?  ?  ?  ?  ?  ?  ?Mobility Comments: Independent/Modified Independent household ambulator, does not drive due to poor vision, uses RW occasionally. ?ADLs Comments: Assisted by family and home nurse as needed. ?  ? ? ?Hand Dominance  ? Dominant Hand: Right ? ?  ?Extremity/Trunk Assessment  ? Upper Extremity Assessment ?Upper Extremity Assessment: Defer to OT evaluation ?  ? ?Lower Extremity Assessment ?Lower Extremity Assessment: Generalized weakness (able to move both LE against gravity) ?  ? ?Cervical / Trunk Assessment ?Cervical / Trunk Assessment: Kyphotic  ?Communication  ?    ?Cognition Arousal/Alertness: Lethargic ?Behavior During Therapy: Flat affect ?Overall Cognitive Status: No family/caregiver present to determine baseline cognitive functioning ?  ?  ?  ?  ?  ?  ?  ?  ?  ?  ?  ?  ?  ?  ?  ?  ?General Comments: pt oriented to self, flat affect throughout. able to follow one step commands ~70% of the time with repetition and extended time ?  ?  ? ?  ?General Comments   ? ?  ?Exercises    ? ?Assessment/Plan  ?  ?PT Assessment Patient needs continued PT services  ?PT Problem List Decreased strength;Decreased mobility;Decreased range of motion;Decreased knowledge of precautions;Decreased balance;Decreased activity tolerance;Decreased knowledge of use  of DME ? ?   ?  ?PT Treatment Interventions DME instruction;Therapeutic exercise;Gait training;Balance training;Stair training;Neuromuscular re-education;Functional mobility training;Therapeutic activities;Patient/family education   ? ?PT Goals (Current goals can be found in the Care Plan section)  ?Acute Rehab PT Goals ?PT Goal Formulation: Patient unable to participate in goal setting ?Time For Goal Achievement: 07/06/21 ?Potential to Achieve Goals: Good ? ?  ?Frequency Min 2X/week ?  ? ? ?Co-evaluation PT/OT/SLP Co-Evaluation/Treatment: Yes ?Reason for Co-Treatment: Necessary to address cognition/behavior during functional activity;To address functional/ADL transfers;For patient/therapist safety ?PT goals addressed during session: Mobility/safety with mobility;Balance ?OT goals addressed during session: ADL's and self-care;Strengthening/ROM;Proper use of Adaptive equipment and DME ?  ? ? ?  ?AM-PAC PT "6 Clicks" Mobility  ?Outcome Measure Help needed turning from your back to your side while in a flat bed without using bedrails?: A Lot ?Help needed moving from lying on your back to sitting on the side of a flat bed without using bedrails?: A Lot ?Help needed moving to and from a bed to a chair (including a wheelchair)?: A Lot ?Help needed standing up from a chair using your arms (e.g., wheelchair or bedside chair)?: A Lot ?Help needed to walk in hospital room?: A Lot ?Help needed climbing 3-5 steps with a railing? : Total ?6 Click Score: 11 ? ?  ?End of Session   ?Activity Tolerance: Patient limited by lethargy ?Patient left: in bed;with call bell/phone within reach;with bed alarm set ?Nurse Communication: Mobility status ?PT Visit Diagnosis: Other abnormalities of gait and mobility (R26.89);Difficulty in walking, not elsewhere classified (R26.2);Muscle weakness (generalized) (M62.81) ?  ? ?  Time: 7915-0413 ?PT Time Calculation (min) (ACUTE ONLY): 19 min ? ? ?Charges:   PT Evaluation ?$PT Eval Moderate Complexity:  1 Mod ?  ?  ?   ? ? ?Lieutenant Diego PT, DPT ?11:29 AM,06/22/21 ? ?

## 2021-06-22 NOTE — TOC CM/SW Note (Signed)
Left voicemail for granddaughter. Will discuss SNF recommendation when she calls back. ? ?Dayton Scrape, [place] ?[phone] ? ?

## 2021-06-22 NOTE — Assessment & Plan Note (Signed)
Patient with advanced dementia with behavioral changes and prior history of becoming encephalopathic on different occasions. ?Can be due to UTI, less likely pneumonia as procalcitonin remain negative. ?UA with pyuria, urine cultures negative. ?MRI with punctate acute ischemia within the right parietal lobe with should not be responsible for this change in mental status. ?CTA with concern of small hemorrhage and no large vessel occlusion. ?Becoming more alert and following commands. ?Passed swallow evaluation-dysphagia 1 diet started ?-Fall and aspiration precautions ?

## 2021-06-22 NOTE — Progress Notes (Signed)
?Progress Note ? ? ?Patient: Cheyenne [name] NKN:[id number] DOB: 03/29/1950 DOA: 06/20/2021     1 ?DOS: the patient was seen and examined on 06/22/2021 ?  ?Brief hospital course: ?Taken from H&P. ? ?Cheyenne [name] is a 71 y.o. female with medical history significant for DM, HTN, left hemiparesis from prior stroke, HLD,  who at baseline ambulates independently who was brought to the ED by EMS after her daughter found her sitting on the commode several hours after she was helped to the commode at 3 PM.  Patient had previously been complaining of weakness.  A code stroke was activated for NIHSS of 17.  Head CT showed a small hyperdense lesion.  No thrombolysis was given as patient was outside tPA window and due to likely alternative diagnosis.  Neurology telemetry consult was done and findings were found to be more consistent with toxic metabolic encephalopathy. ?Additional data review: BP in ED as high as 213/101 with otherwise normal vitals.  Blood work showed glucose of 255, VBG with PCO2 of 41, urinalysis consistent with UTI but labs otherwise unremarkable.  Chest x-ray showing developing right middle lobe infiltrate.  CT abdomen and pelvis with possible sigmoid colitis/diverticulitis, cystitis.  MRI brain as recommended by neurologist showed the following: ?IMPRESSION: ?1. Punctate focus of acute ischemia within the right parietal lobe. ?No hemorrhage or mass effect. ?2. 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 ?3. Moderate chronic microvascular ischemia and volume loss. ? ?Patient received Unasyn, azithromycin and Flagyl in ED.  Flagyl was discontinued as Unasyn can provide anaerobic coverage. ? ?Concern of UTI versus pneumonia.  Baseline procalcitonin negative.  UA with pyuria and urine cultures pending.  Unremarkable CBC.  Doubt it is pneumonia so we will discontinue azithromycin. ?Lipid panel without any significant  normality, LDL of 74. ?UDS negative. ? ?4/19: Patient remained quite encephalopathic and only showing minimum response to sternal rub.  Protecting airway.  Not following any commands today.  Unable to obtain swallow evaluation so we are keeping her n.p.o. until mental status improves. ?Blood pressure remained elevated, mostly above 200, added as needed labetalol and we will allow permissive hypertension up to 379 systolic. ?Patient also has an history of underlying dementia and cognitive impairment, per PCP note this is a struggle with altered mental status for a long time and they were recommending continue Depakote and olanzapine for now.  Her psychiatrist was recommending restarting Lexapro clonazepam due to aggressive behavior.  ? ?She was recently admitted at [facility] with bladder rupture s/p repair and now Foley is out and she is voiding on her own.  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  And she was discharged to rehab after that admission. ? ?Neurology was consulted-will appreciate their recommendations. ? ?4/20: Echocardiogram with normal EF and no other significant abnormality. ?EEG with with continuous generalized slowing/encephalopathy, nonspecific to etiology.  No seizure or epileptiform changes. ?Because of the concern of cerebral amyloid angiopathy and increased risk of ICH associated with it the neurology recommending discontinuing aspirin, Plavix and statin.  See full neurology consult note for detail.  Patient will need outpatient neurology evaluation for possible CAA as it can lead to dementia. ? ?CTA with unchanged small focus of hyperdensity in the posterior superior right frontal lobe suspicious for small amount of acute blood products, possibly related to underlying amyloid angiopathy. ?No emergent large vessel occlusion.  Please see full  report. ?Urine cultures negative.  Procalcitonin remain  negative. ? ?Patient became little more alert and following commands today.  Passed swallow evaluation and dysphagia 1 diet was ordered per recommendations. ? ? ? ?Assessment and Plan: ?* Acute metabolic encephalopathy ?Patient with advanced dementia with behavioral changes and prior history of becoming encephalopathic on different occasions. ?Can be due to UTI, less likely pneumonia as procalcitonin remain negative. ?UA with pyuria, urine cultures negative. ?MRI with punctate acute ischemia within the right parietal lobe with should not be responsible for this change in mental status. ?CTA with concern of small hemorrhage and no large vessel occlusion. ?Becoming more alert and following commands. ?Passed swallow evaluation-dysphagia 1 diet started ?-Fall and aspiration precautions ? ?Acute CVA (cerebrovascular accident) ([place]) ?Likely incidental finding based on review of neurology note.  Neurologic deficit more consistent with encephalopathy ?Head CT shows a small hyperdense lesion, confirmed on CT ?Multiple chronic microhemorrhages on MRIs consistent with amyloid. ?Neurology was consulted-appreciate their recommendations. ?Patient completed the stroke work-up ?Neurology stopped aspirin, Plavix and statin due to increased risk of hemorrhage with CAA which is evident on her images. ?Patient will need outpatient neurology follow-up for further management. ?PT/OT are recommending SNF. ? ?  ? ?Urinary tract infection ?Urine cultures are negative. ?We will continue with Unasyn for concern of some colitis for another day. ? ?Aspiration pneumonia ([place]) ?Less likely pneumonia as procalcitonin remain negative. ?Discontinue Zithromax ?Continue Unasyn for 1 more day ?Passed swallow evaluation- started on dysphagia 1 diet ? ?Hypertensive urgency ?Hold home antihypertensives of amlodipine and losartan to allow for permissive hypertension. ?-As needed labetalol for systolic above 476. ? ?Colitis on CT ?She was placed on Flagyl  in ED for concern of colitis. ?Unasyn can provide anaerobic coverage so Flagyl was discontinued. ?-Continue to monitor ? ?Uncontrolled type 2 diabetes mellitus with hyperglycemia, with long-term current use of insulin ([place]) ?CBG elevated.  A1c pending. ?-Continue with sliding scale only as patient is n.p.o. ? 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  Increase nursing assistance with transfers. ? ?  ? ?Subjective: Patient was seen and examined today.  She was more alert, oriented to self only.  She was following commands and feeling little hungry.  Denies any pain. ? ?Physical Exam: ?Vitals:  ? 06/22/21 0419 06/22/21 0824 06/22/21 1236 06/22/21 1534  ?BP: (!) 142/49 (!) 142/57 (!) 164/65 (!) 151/63  ?Pulse: 63 73 81 82  ?Resp: '16 17 16 17  '$ ?Temp: 98.3 ?F (36.8 ?C) 98.2 ?F (36.8 ?C) 98.4 ?F (36.9 ?C) 98 ?F (36.7 ?C)  ?TempSrc: Oral Oral Oral Oral  ?SpO2: 95% 95% 94% 98%  ?Weight:      ?Height:      ? ?General.  Chronically ill-appearing elderly lady, in no acute distress. ?Pulmonary.  Lungs clear bilaterally, normal respiratory effort. ?CV.  Regular rate and rhythm, no JVD, rub or murmur. ?Abdomen.  Soft, nontender, nondistended, BS positive. ?CNS.  Alert and oriented to self.  No focal neurologic deficit. ?Extremities.  No edema, no cyanosis, pulses intact and symmetrical. ?Psychiatry.  Judgment and insight appears impaired. ? ?Data Reviewed: ?Prior notes and labs reviewed ? ?Family Communication:  ? ?Disposition: ?Status is: Inpatient ?Remains inpatient appropriate because: Severity of illness, now medically stable, TOC is looking for placement ? ? Planned Discharge Destination: Skilled nursing facility ? ?DVT prophylaxis.  SCDs ? ?Time spent: 45 minutes ? ?This record has been created using Set designer  software. Errors have been sought and corrected,but may not always be located. Such creation errors  do not reflect on the standard of care. ? ?Author: ?Lorella Nimrod, MD ?06/22/2021 4:51 PM ? ?For on call review [email or web address].  ?

## 2021-06-22 NOTE — [place] FL2 (Signed)
?[place] MEDICAID FL2 LEVEL OF CARE SCREENING TOOL  ?  ? ?IDENTIFICATION  ?Patient Name: ?Cheyenne [name] Birthdate: 05-27-1950 Sex: female Admission Date (Current Location): ?06/20/2021  ?[place] and [place] Number: ? [place] ?  Facility and Address:  ?[facility], [address], [place], [place] [address] ?     Provider Number: ?[id number]  ?Attending Physician Name and Address:  ?Lorella Nimrod, MD ? Relative Name and Phone Number:  ?  ?   ?Current Level of Care: ?Hospital Recommended Level of Care: ?[place] Prior Approval Number: ?  ? ?Date Approved/Denied: ?  PASRR Number: ?[id number] ? ?Discharge Plan: ?SNF ?  ? ?Current Diagnoses: ?Patient Active Problem List  ? Diagnosis Date Noted  ? Acute metabolic encephalopathy 56/21/3086  ? Hypertensive urgency 06/21/2021  ? Diabetes mellitus without complication ([place])   ? Hemiparesis affecting left side as late effect of cerebrovascular accident (CVA) ([place])   ? Aspiration pneumonia ([place])   ? Urinary tract infection 02/02/2021  ? 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 Protein-calorie malnutrition, severe 11/18/2020  ? Colitis on CT 11/10/2020  ? Abdominal pain 11/10/2020  ? Nausea & vomiting 11/10/2020  ? AKI (acute kidney injury) ([place]) 11/10/2020  ? Hyperglycemia due to diabetes mellitus ([place]) 11/10/2020  ? Leukocytosis 11/10/2020  ? Mixed hyperlipidemia 11/10/2020  ? Tobacco use 11/10/2020  ? Asthma 11/10/2020  ? SIRS (systemic inflammatory response syndrome) ([place]) 11/10/2020  ? Restless leg syndrome 11/10/2020  ? Vitamin D deficiency 11/10/2020  ? Enteritis 11/10/2020  ? Acute CVA (cerebrovascular accident) ([place])   ? Generalized weakness 10/25/2020  ? Essential hypertension 10/25/2020  ? Uncontrolled type 2 diabetes mellitus with hyperglycemia, with long-term current use of insulin ([place]) 10/25/2020  ? Psychiatric problem 10/25/2020  ? ? ?Orientation RESPIRATION  BLADDER Height & Weight   ?  ?Self ? Normal Incontinent, External catheter Weight: 139 lb 1.8 oz (63.1 kg) ?Height:  '5\' 4"'$  (162.6 cm)  ?BEHAVIORAL SYMPTOMS/MOOD NEUROLOGICAL BOWEL NUTRITION STATUS  ? (None)  (None) Continent Diet (DYS 1)  ?AMBULATORY STATUS COMMUNICATION OF NEEDS Skin   ?Limited Assist Verbally Skin abrasions ?  ?  ?  ?    ?     ?     ? ? ?Personal Care Assistance Level of Assistance  ?Bathing, Feeding, Dressing Bathing Assistance: Maximum assistance ?Feeding assistance: Limited assistance ?Dressing Assistance: Maximum assistance ?   ? ?Functional Limitations Info  ?Sight, Hearing, Speech Sight Info: Adequate ?Hearing Info: Adequate ?Speech Info: Adequate  ? ? ?SPECIAL CARE FACTORS FREQUENCY  ?PT (By licensed PT), OT (By licensed OT), Speech therapy   ?  ?PT Frequency: 5 x week ?OT Frequency: 5 x week ?  ?  ?Speech Therapy Frequency: 5 x week ?   ? ? ?Contractures Contractures Info: Not present  ? ? ?Additional Factors Info  ?Code Status, Allergies Code Status Info: Full code ?Allergies Info: Clonazepam, Gabapentin, Pregabalin, Codeine ?  ?  ?  ?   ? ?Current Medications (06/22/2021):  This is the current hospital active medication list ?Current Facility-Administered Medications  ?Medication Dose Route Frequency Provider Last Rate Last Admin  ?  stroke: early stages of recovery book   Does not apply Once Athena Masse, MD      ? 0.9 %  sodium chloride infusion   Intravenous Continuous Athena Masse, MD 75 mL/hr at 06/22/21 0648 New Bag at 06/22/21 5784  ? acetaminophen (TYLENOL) tablet 650 mg  650 mg Oral Q4H PRN Athena Masse, MD      ? Or  ? acetaminophen (TYLENOL) 160 MG/5ML solution 650 mg  650 mg Per Tube Q4H PRN Athena Masse, MD      ? Or  ? acetaminophen (TYLENOL) suppository 650 mg  650 mg Rectal Q4H PRN Athena Masse, MD      ? Ampicillin-Sulbactam (UNASYN) 3 g in sodium chloride 0.9 % 100 mL IVPB  3 g Intravenous Q6H Bel[place], Alver Sorrow, RPH 200 mL/hr at 06/22/21 1223 3 g at 06/22/21  1223  ? insulin aspart (novoLOG) injection 0-15 Units  0-15 Units Subcutaneous Q4H Athena Masse, MD   5 Units at 06/22/21 1223  ? labetalol (NORMODYNE) injection 10 mg  10 mg Intravenous Q2H PRN Lorella Nimrod, MD   10 mg at 06/22/21 0046  ? QUEtiapine (SEROQUEL) tablet 25 mg  25 mg Oral QHS Lorella Nimrod, MD      ? ? ? ?Discharge Medications: ?Please see discharge summary for a list of discharge medications. ? ?Relevant Imaging Results: ? ?Relevant Lab Results: ? ? ?Additional Information ?SS#: [id number] ? ?Candie Chroman, LCSW ? ? ? ? ?

## 2021-06-23 DIAGNOSIS — G9341 Metabolic encephalopathy: Secondary | ICD-10-CM | POA: Diagnosis not present

## 2021-06-23 LAB — GLUCOSE, CAPILLARY
Glucose-Capillary: 105 mg/dL — ABNORMAL HIGH (ref 70–99)
Glucose-Capillary: 135 mg/dL — ABNORMAL HIGH (ref 70–99)
Glucose-Capillary: 176 mg/dL — ABNORMAL HIGH (ref 70–99)
Glucose-Capillary: 191 mg/dL — ABNORMAL HIGH (ref 70–99)
Glucose-Capillary: 268 mg/dL — ABNORMAL HIGH (ref 70–99)
Glucose-Capillary: 295 mg/dL — ABNORMAL HIGH (ref 70–99)

## 2021-06-23 LAB — PROCALCITONIN: Procalcitonin: <0.1 ng/mL

## 2021-06-23 MED ORDER — FAMOTIDINE 20 MG PO TABS
20.0000 mg | ORAL_TABLET | Freq: Two times a day (BID) | ORAL | Status: DC
Start: 2021-06-23 — End: 2021-06-27
  Administered 2021-06-23 – 2021-06-27 (×9): 20 mg via ORAL
  Filled 2021-06-23 (×9): qty 1

## 2021-06-23 MED ORDER — INSULIN ASPART 100 UNIT/ML IJ SOLN
0.0000 [IU] | Freq: Every day | INTRAMUSCULAR | Status: DC
  Administered 2021-06-23: 3 [IU] via SUBCUTANEOUS
  Administered 2021-06-24: 4 [IU] via SUBCUTANEOUS
  Administered 2021-06-25: 3 [IU] via SUBCUTANEOUS
  Filled 2021-06-23 (×3): qty 1

## 2021-06-23 MED ORDER — OLANZAPINE 5 MG PO TBDP
2.5000 mg | ORAL_TABLET | Freq: Every day | ORAL | Status: DC
  Administered 2021-06-23 – 2021-06-26 (×4): 2.5 mg via SUBLINGUAL
  Filled 2021-06-23 (×4): qty 0.5

## 2021-06-23 MED ORDER — LOSARTAN POTASSIUM 50 MG PO TABS
50.0000 mg | ORAL_TABLET | Freq: Two times a day (BID) | ORAL | Status: DC
  Administered 2021-06-23 – 2021-06-27 (×9): 50 mg via ORAL
  Filled 2021-06-23 (×9): qty 1

## 2021-06-23 MED ORDER — THIAMINE HCL 100 MG PO TABS
100.0000 mg | ORAL_TABLET | Freq: Every day | ORAL | Status: DC
  Administered 2021-06-23 – 2021-06-27 (×5): 100 mg via ORAL
  Filled 2021-06-23 (×5): qty 1

## 2021-06-23 MED ORDER — INSULIN ASPART 100 UNIT/ML IJ SOLN
0.0000 [IU] | Freq: Three times a day (TID) | INTRAMUSCULAR | Status: DC
  Administered 2021-06-23 – 2021-06-24 (×2): 3 [IU] via SUBCUTANEOUS
  Administered 2021-06-24 (×2): 8 [IU] via SUBCUTANEOUS
  Administered 2021-06-25: 5 [IU] via SUBCUTANEOUS
  Administered 2021-06-25: 15 [IU] via SUBCUTANEOUS
  Administered 2021-06-25: 11 [IU] via SUBCUTANEOUS
  Administered 2021-06-26 (×3): 8 [IU] via SUBCUTANEOUS
  Administered 2021-06-27: 5 [IU] via SUBCUTANEOUS
  Filled 2021-06-23 (×11): qty 1

## 2021-06-23 NOTE — Progress Notes (Addendum)
Inpatient Diabetes Program Recommendations ? ?AACE/ADA: New Consensus Statement on Inpatient Glycemic Control (2015) ? ?Target Ranges:  Prepandial:   less than 140 mg/dL ?     Peak postprandial:   less than 180 mg/dL (1-2 hours) ?     Critically ill patients:  140 - 180 mg/dL  ? ? Latest Reference Range & Units 06/22/21 00:25 06/22/21 04:20 06/22/21 08:11 06/22/21 08:25 06/22/21 12:12 06/22/21 16:51 06/22/21 20:08  ?Glucose-Capillary 70 - 99 mg/dL 130 (H) ? ?2 units Novolog ? 110 (H) 113 (H) 126 (H) 212 (H) ? ?5 units Novolog 272 (H) ? ?8 units Novolog 334 (H) ? ?11 units Novolog  ?(H): Data is abnormally high ? Latest Reference Range & Units 06/23/21 00:07 06/23/21 04:58 06/23/21 08:09  ?Glucose-Capillary 70 - 99 mg/dL 268 (H) ? ?8 units Novolog 105 (H) 135 (H) ? ?2 units Novolog  ?(H): Data is abnormally high ? ? ? ? ?Home DM Meds: Amaryl 1 mg daily ?      Lantus 40 units AM/ 20 units PM ?      Novolog?? ? ?Current Orders: Novolog Moderate Correction Scale/ SSI (0-15 units) Q4 hours ? ? ? ?MD- Note patient takes Lantus at home but AM CBGs have been well controlled.  ? ?Afternoon CBGs elevated ? ?Please consider starting low dose Novolog Meal Coverage:  ? ?Novolog 3 units TID with meals ?HOLD if pt eats <50% meals ? ? ? ?--Will follow patient during hospitalization-- ? ?Wyn Quaker RN, MSN, CDE ?Diabetes Coordinator ?Inpatient Glycemic Control Team ?Team Pager: [phone] (8a-5p) ? ? ?

## 2021-06-23 NOTE — TOC Progression Note (Addendum)
Transition of Care (TOC) - Progression Note  ? ? ?Patient Details  ?Name: Cheyenne [name] ?MRN: [id number] ?Date of Birth: 09-Aug-1950 ? ?Transition of Care (TOC) CM/SW Contact  ?Candie Chroman, LCSW ?Phone Number: ?06/23/2021, 12:05 PM ? ?Clinical Narrative: Granddaughter is still planning to [place] today. She will contact CSW when she is done.   ? ?2:09 pm: Granddaughter has accepted bed offer from [facility]. Left message for admissions coordinator to notify. CMA will start insurance authorization. ? ?2:42 pm: Josem Kaufmann has been started. [facility] does not accept weekend admissions. ? ?Expected Discharge Plan: [place] ?Barriers to Discharge: Continued Medical Work up, Ship broker ? ?Expected Discharge Plan and Services ?Expected Discharge Plan: [place] ?  ?  ?Post Acute Care Choice: [place] ?Living arrangements for the past 2 months: [place] ?                ?  ?  ?  ?  ?  ?  ?  ?  ?  ?  ? ? ?Social Determinants of Health (SDOH) Interventions ?  ? ?Readmission Risk Interventions ? ?  02/03/2021  ? 11:22 AM 01/25/2021  ?  4:22 PM 11/14/2020  ?  4:33 PM  ?Readmission Risk Prevention Plan  ?Medication Screening  Complete   ?Transportation Screening Complete Complete   ?Home Care Screening     ?Medication Review (RN CM)     ?[place] or Home Care Consult Complete    ?Social Work Consult for [place] Planning/Counseling Complete    ?Palliative Care Screening Not Applicable    ?Medication Review Press photographer) Complete    ?  ? Information is confidential and restricted. Go to Review Flowsheets to unlock data.  ? ? ?

## 2021-06-23 NOTE — Assessment & Plan Note (Signed)
Blood pressure elevated.  Completed permissive hypertension. ?-Restarting home losartan ?-Apparently she was not taking amlodipine-we will readd if needed ?-As needed labetalol for systolic above 888. ?

## 2021-06-23 NOTE — Assessment & Plan Note (Signed)
Less likely pneumonia as procalcitonin remain negative. ?Discontinue Zithromax ?Discontinue Unasyn ?Passed swallow evaluation- started on dysphagia 1 diet ?

## 2021-06-23 NOTE — Assessment & Plan Note (Signed)
Likely incidental finding based on review of neurology note.  Neurologic deficit more consistent with encephalopathy ?Head CT shows a small hyperdense lesion, confirmed on CT ?Multiple chronic microhemorrhages on MRIs consistent with amyloid. ?Neurology was consulted-appreciate their recommendations. ?Patient completed the stroke work-up ?Neurology stopped aspirin, Plavix and statin due to increased risk of hemorrhage with CAA which is evident on her images. ?Patient will need outpatient neurology follow-up for further management. ?PT/OT are recommending SNF. ? ?  ?

## 2021-06-23 NOTE — Assessment & Plan Note (Signed)
Urine cultures are negative. ?-Discontinue antibiotics ?

## 2021-06-23 NOTE — Assessment & Plan Note (Signed)
CBG within goal. -Continue with SSI 

## 2021-06-23 NOTE — Plan of Care (Signed)
?  Problem: Clinical Measurements: ?Goal: Ability to maintain clinical measurements within normal limits will improve ?Outcome: Progressing ?Goal: Respiratory complications will improve ?Outcome: Progressing ?Goal: Cardiovascular complication will be avoided ?Outcome: Progressing ?  ?Problem: Activity: ?Goal: Risk for activity intolerance will decrease ?Outcome: Progressing ?  ?Problem: Nutrition: ?Goal: Adequate nutrition will be maintained ?Outcome: Progressing ?  ?Problem: Coping: ?Goal: Level of anxiety will decrease ?Outcome: Progressing ?  ?Problem: Elimination: ?Goal: Will not experience complications related to bowel motility ?Outcome: Progressing ?Goal: Will not experience complications related to urinary retention ?Outcome: Progressing ?  ?

## 2021-06-23 NOTE — Assessment & Plan Note (Signed)
Left-sided upper extremity weakness from prior stroke.  Increase nursing assistance with transfers. ?

## 2021-06-23 NOTE — Progress Notes (Signed)
Physical Therapy Treatment ?Patient Details ?Name: Cheyenne [name] ?MRN: [id number] ?DOB: 08-31-1950 ?Today's Date: 06/23/2021 ? ? ?History of Present Illness Pt is a 76 female that presented to ED for AMS, found on commode by family. Imaging showed punctate focus of acute ischemia within the right parietal lobe. No hemorrhage or mass effect, innumerable chronic microhemorrhages in a predominantly peripheral distribution, consistent with cerebral amyloid angiopathy. PMH of  DM, HTN, left hemiparesis from prior stroke, HLD, behavioral distrubance, endometrial cancer. ? ?  ?PT Comments  ? ? Pt up with tech upon arrival in the middle of bath.  Assisted with care for safety and after she is able to walk x 2 laps in room with RW and min guard.  Slow gait with decreased step height and length.  Remained in chair after session. ? ?Pt has made good progress with mobility but is not at baseline.  Per chart she is independent but living with family.  She continues to need min guard/assist for mobility and safety along with cues as safety deficits are evident.  SNF remains appropriate.  If pt/family decline SNF, she will need +1 assist for mobility and safety at home along with RW and SBC. ?  ?Recommendations for follow up therapy are one component of a multi-disciplinary discharge planning process, led by the attending physician.  Recommendations may be updated based on patient status, additional functional criteria and insurance authorization. ? ?Follow Up Recommendations ? Skilled nursing-short term rehab (<3 hours/day) ?  ?  ?Assistance Recommended at Discharge Frequent or constant Supervision/Assistance  ?Patient can return home with the following A little help with walking and/or transfers;A little help with bathing/dressing/bathroom;Assistance with cooking/housework;Direct supervision/assist for financial management;Help with stairs or ramp for entrance;Assist for transportation;Assistance with feeding ?  ?Equipment  Recommendations ?    ?  ?Recommendations for Other Services   ? ? ?  ?Precautions / Restrictions Precautions ?Precautions: Fall ?Restrictions ?Weight Bearing Restrictions: No  ?  ? ?Mobility ? Bed Mobility ?  ?  ?  ?  ?  ?  ?  ?General bed mobility comments: in chair at sink with tech upon arrival ?  ? ?Transfers ?Overall transfer level: Needs assistance ?Equipment used: Rolling walker (2 wheels) ?Transfers: Sit to/from Stand ?Sit to Stand: Min assist ?  ?  ?  ?  ?  ?  ?  ? ?Ambulation/Gait ?Ambulation/Gait assistance: Min assist, Min guard ?Gait Distance (Feet): 50 Feet ?Assistive device: Rolling walker (2 wheels) ?Gait Pattern/deviations: Step-through pattern, Decreased step length - right, Decreased step length - left, Narrow base of support ?Gait velocity: decreased ?  ?  ?General Gait Details: to door and back x 2 in room. ? ? ?Stairs ?  ?  ?  ?  ?  ? ? ?Wheelchair Mobility ?  ? ?Modified Rankin (Stroke Patients Only) ?  ? ? ?  ?Balance Overall balance assessment: Needs assistance ?Sitting-balance support: Feet supported ?Sitting balance-Leahy Scale: Fair ?  ?  ?Standing balance support: Reliant on assistive device for balance, Bilateral upper extremity supported ?Standing balance-Leahy Scale: Poor ?  ?  ?  ?  ?  ?  ?  ?  ?  ?  ?  ?  ?  ? ?  ?Cognition Arousal/Alertness: Awake/alert ?Behavior During Therapy: [facility] for tasks assessed/performed, Flat affect ?Overall Cognitive Status: Within Functional Limits for tasks assessed ?  ?  ?  ?  ?  ?  ?  ?  ?  ?  ?  ?  ?  ?  ?  ?  ?  General Comments: seems improved today and more engaged but poor historian and does not answer appropriately at times to question asked.  ? hearing vs cognition ?  ?  ? ?  ?Exercises Other Exercises ?Other Exercises: assisted tech with bathing.  she is able to stand several times at sink for care ? ?  ?General Comments   ?  ?  ? ?Pertinent Vitals/Pain Pain Assessment ?Pain Assessment: No/denies pain  ? ? ?Home Living   ?  ?  ?  ?  ?  ?  ?  ?  ?   ?   ?  ?Prior Function    ?  ?  ?   ? ?PT Goals (current goals can now be found in the care plan section) Progress towards PT goals: Progressing toward goals ? ?  ?Frequency ? ? ? Min 2X/week ? ? ? ?  ?PT Plan Current plan remains appropriate  ? ? ?Co-evaluation   ?  ?  ?  ?  ? ?  ?AM-PAC PT "6 Clicks" Mobility   ?Outcome Measure ? Help needed turning from your back to your side while in a flat bed without using bedrails?: A Little ?Help needed moving from lying on your back to sitting on the side of a flat bed without using bedrails?: A Little ?Help needed moving to and from a bed to a chair (including a wheelchair)?: A Little ?Help needed standing up from a chair using your arms (e.g., wheelchair or bedside chair)?: A Little ?Help needed to walk in hospital room?: A Little ?Help needed climbing 3-5 steps with a railing? : A Lot ?6 Click Score: 17 ? ?  ?End of Session Equipment Utilized During Treatment: Gait belt ?Activity Tolerance: Patient tolerated treatment well ?Patient left: in chair;with call bell/phone within reach;with chair alarm set;with nursing/sitter in room ?Nurse Communication: Mobility status ?PT Visit Diagnosis: Other abnormalities of gait and mobility (R26.89);Difficulty in walking, not elsewhere classified (R26.2);Muscle weakness (generalized) (M62.81) ?  ? ? ?Time: 4008-6761 ?PT Time Calculation (min) (ACUTE ONLY): 13 min ? ?Charges:  $Gait Training: 8-22 mins          ?         Chesley Noon, PTA ?06/23/21, 11:44 AM ? ?

## 2021-06-23 NOTE — Assessment & Plan Note (Signed)
Resolved.  Patient appears to be at her baseline now ?Patient with advanced dementia with behavioral changes and prior history of becoming encephalopathic on different occasions. ?Can be due to UTI, less likely pneumonia as procalcitonin remain negative. ?UA with pyuria, urine cultures negative. ?MRI with punctate acute ischemia within the right parietal lobe with should not be responsible for this change in mental status. ?CTA with concern of small hemorrhage and no large vessel occlusion. ?Becoming more alert and following commands. ?Passed swallow evaluation-dysphagia 1 diet started ?-Fall and aspiration precautions ?

## 2021-06-23 NOTE — Progress Notes (Signed)
?Progress Note ? ? ?Patient: Cheyenne [name] VQQ:[id number] DOB: [birth year] DOA: 06/20/2021     2 ?DOS: the patient was seen and examined on 06/23/2021 ?  ?Brief hospital course: ?Taken from H&P. ? ?Cheyenne [name] is a 71 y.o. female with medical history significant for DM, HTN, left hemiparesis from prior stroke, HLD,  who at baseline ambulates independently who was brought to the ED by EMS after her daughter found her sitting on the commode several hours after she was helped to the commode at 3 PM.  Patient had previously been complaining of weakness.  A code stroke was activated for NIHSS of 17.  Head CT showed a small hyperdense lesion.  No thrombolysis was given as patient was outside tPA window and due to likely alternative diagnosis.  Neurology telemetry consult was done and findings were found to be more consistent with toxic metabolic encephalopathy. ?Additional data review: BP in ED as high as 213/101 with otherwise normal vitals.  Blood work showed glucose of 255, VBG with PCO2 of 41, urinalysis consistent with UTI but labs otherwise unremarkable.  Chest x-ray showing developing right middle lobe infiltrate.  CT abdomen and pelvis with possible sigmoid colitis/diverticulitis, cystitis.  MRI brain as recommended by neurologist showed the following: ?IMPRESSION: ?1. Punctate focus of acute ischemia within the right parietal lobe. ?No hemorrhage or mass effect. ?2. 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 ?3. Moderate chronic microvascular ischemia and volume loss. ? ?Patient received Unasyn, azithromycin and Flagyl in ED.  Flagyl was discontinued as Unasyn can provide anaerobic coverage. ? ?Concern of UTI versus pneumonia.  Baseline procalcitonin negative.  UA with pyuria and urine cultures pending.  Unremarkable CBC.  Doubt it is pneumonia so we will discontinue azithromycin. ?Lipid panel without any significant  normality, LDL of 74. ?UDS negative. ? ?4/19: Patient remained quite encephalopathic and only showing minimum response to sternal rub.  Protecting airway.  Not following any commands today.  Unable to obtain swallow evaluation so we are keeping her n.p.o. until mental status improves. ?Blood pressure remained elevated, mostly above 200, added as needed labetalol and we will allow permissive hypertension up to 433 systolic. ?Patient also has an history of underlying dementia and cognitive impairment, per PCP note this is a struggle with altered mental status for a long time and they were recommending continue Depakote and olanzapine for now.  Her psychiatrist was recommending restarting Lexapro clonazepam due to aggressive behavior.  ? ?She was recently admitted at [facility] with bladder rupture s/p repair and now Foley is out and she is voiding on her own.  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  And she was discharged to rehab after that admission. ? ?Neurology was consulted-will appreciate their recommendations. ? ?4/20: Echocardiogram with normal EF and no other significant abnormality. ?EEG with with continuous generalized slowing/encephalopathy, nonspecific to etiology.  No seizure or epileptiform changes. ?Because of the concern of cerebral amyloid angiopathy and increased risk of ICH associated with it the neurology recommending discontinuing aspirin, Plavix and statin.  See full neurology consult note for detail.  Patient will need outpatient neurology evaluation for possible CAA as it can lead to dementia. ? ?CTA with unchanged small focus of hyperdensity in the posterior superior right frontal lobe suspicious for small amount of acute blood products, possibly related to underlying amyloid angiopathy. ?No emergent large vessel occlusion.  Please see full  report. ?Urine cultures negative.  Procalcitonin remain  negative. ? ?Patient became little more alert and following commands today.  Passed swallow evaluation and dysphagia 1 diet was ordered per recommendations. ? ?4/21: Patient appears to be back to her baseline.  Alert and oriented to self and place.  Eating and drinking okay.  PT/OT are recommending rehab.  Stable for discharge.  TOC is working on placement. ?Neurology recommending stopping aspirin and Plavix because of increased risk of hemorrhage with CVA.  She will need a outpatient neurology follow-up for further management. ?Urine cultures and procalcitonin remain negative, discontinuing antibiotics. ? ? ? ? ?Assessment and Plan: ?* Acute metabolic encephalopathy ?Resolved.  Patient appears to be at her baseline now ?Patient with advanced dementia with behavioral changes and prior history of becoming encephalopathic on different occasions. ?Can be due to UTI, less likely pneumonia as procalcitonin remain negative. ?UA with pyuria, urine cultures negative. ?MRI with punctate acute ischemia within the right parietal lobe with should not be responsible for this change in mental status. ?CTA with concern of small hemorrhage and no large vessel occlusion. ?Becoming more alert and following commands. ?Passed swallow evaluation-dysphagia 1 diet started ?-Fall and aspiration precautions ? ?Acute CVA (cerebrovascular accident) ([place]) ?Likely incidental finding based on review of neurology note.  Neurologic deficit more consistent with encephalopathy ?Head CT shows a small hyperdense lesion, confirmed on CT ?Multiple chronic microhemorrhages on MRIs consistent with amyloid. ?Neurology was consulted-appreciate their recommendations. ?Patient completed the stroke work-up ?Neurology stopped aspirin, Plavix and statin due to increased risk of hemorrhage with CAA which is evident on her images. ?Patient will need outpatient neurology follow-up for further management. ?PT/OT are recommending SNF. ? ?  ? ?Urinary tract  infection ?Urine cultures are negative. ?-Discontinue antibiotics ? ?Aspiration pneumonia ([place]) ?Less likely pneumonia as procalcitonin remain negative. ?Discontinue Zithromax ?Discontinue Unasyn ?Passed swallow evaluation- started on dysphagia 1 diet ? ?Hypertensive urgency ?Blood pressure elevated.  Completed permissive hypertension. ?-Restarting home losartan ?-Apparently she was not taking amlodipine-we will readd if needed ?-As needed labetalol for systolic above 086. ? ?Colitis on CT ?She was placed on Flagyl in ED for concern of colitis. ?Later she received Unasyn which has been not discontinued. ? ?Uncontrolled type 2 diabetes mellitus with hyperglycemia, with long-term current use of insulin ([place]) ?CBG within goal. ?-Continue with SSI ? 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  Increase nursing assistance with transfers. ? ?  ? ?Subjective: Patient was sitting and resting comfortably in chair when seen today.  She appears to be at her baseline and able to communicate well. Ate her breakfast and no new concerns. ? ?Physical Exam: ?Vitals:  ? 06/23/21 0008 06/23/21 0508 06/23/21 0810 06/23/21 1144  ?BP: (!) 166/77 (!) 176/89 (!) 173/69 (!) 175/64  ?Pulse: 85 87 75 65  ?Resp: '18 18 19 19  '$ ?Temp: 98.1 ?F (36.7 ?C) 98 ?F (36.7 ?C) 97.9 ?F (36.6 ?C) 98.4 ?F (36.9 ?C)  ?TempSrc:      ?SpO2: 94% 96% 91% 99%  ?Weight:      ?Height:      ? ?General.     In no acute distress. ?Pulmonary.  Lungs clear bilaterally, normal respiratory effort. ?CV.  Regular rate and rhythm, no JVD, rub or murmur. ?Abdomen.  Soft, nontender, nondistended, BS positive. ?CNS.  Alert and oriented .  No new focal neurologic deficit. ?Extremities.  No edema, no cyanosis, pulses intact and symmetrical. ?Psychiatry.  Appears to have mild cognitive disorder ? ?Data Reviewed: ?Prior notes and labs reviewed ? ?Family Communication: Discussed with  granddaughter on phone ? ?Disposition: ?Status is: Inpatient ?Remains inpatient appropriate because: Waiting for rehab placement ? ? Planned Discharge Destination: Skilled nursing facility ? ?Time spent: 45 minutes ? ?This record has

## 2021-06-23 NOTE — Assessment & Plan Note (Signed)
She was placed on Flagyl in ED for concern of colitis. ?Later she received Unasyn which has been not discontinued. ?

## 2021-06-23 NOTE — Progress Notes (Signed)
Occupational Therapy Treatment ?Patient Details ?Name: Cheyenne [name] ?MRN: [id number] ?DOB: 01-16-1951 ?Today's Date: 06/23/2021 ? ? ?History of present illness Pt is a 54 female that presented to ED for AMS, found on commode by family. Imaging showed punctate focus of acute ischemia within the right parietal lobe. No hemorrhage or mass effect, innumerable chronic microhemorrhages in a predominantly peripheral distribution, consistent with cerebral amyloid angiopathy. PMH of  DM, HTN, left hemiparesis from prior stroke, HLD, behavioral distrubance, endometrial cancer. ?  ?OT comments ? Pt completed long sitting grooming and self feeding tasks with setup and supervision using RUE but when incorporating LUE into task, required increased assist. Pt set up to hold small pill cup with crushed meds mixed with applesauce from RN and able to  stabilize cup with L hand while using R hand to scoop and self feed. No spillage. She was able to complete with initial CGA improving to supervision. Pt set up with washcloth and washed her entire face with R hand and when prompted to complete using her L hand she required MOD-MAX A. Set up with loosened toothpaste lid, she stabilized the tube with L hand and twisted off the cap with her R hand and squeezed toothpaste onto her toothbrush. She was able to brush her teeth with supv using R hand, requiring MOD A to stabilize kidney bean basin to spit into using L hand to hold. Pt demonstrating significant progress in cognition and functional independence this date as compared to previous session. Continue to recommend SNF at this time as pt continues to benefit from skilled OT services and still currently unsafe to return home.   ? ?Recommendations for follow up therapy are one component of a multi-disciplinary discharge planning process, led by the attending physician.  Recommendations may be updated based on patient status, additional functional criteria and insurance authorization. ?    ?Follow Up Recommendations ? Skilled nursing-short term rehab (<3 hours/day)  ?  ?Assistance Recommended at Discharge Frequent or constant Supervision/Assistance  ?Patient can return home with the following ? A little help with walking and/or transfers;A lot of help with bathing/dressing/bathroom;Assistance with cooking/housework;Assist for transportation;Help with stairs or ramp for entrance;Direct supervision/assist for medications management;Direct supervision/assist for financial management ?  ?Equipment Recommendations ? BSC/3in1  ?  ?Recommendations for Other Services   ? ?  ?Precautions / Restrictions Precautions ?Precautions: Fall ?Restrictions ?Weight Bearing Restrictions: No  ? ? ?  ? ?Mobility Bed Mobility ?  ?  ?  ?  ?  ?  ?  ?  ?  ? ?Transfers ?  ?  ?  ?  ?  ?  ?  ?  ?  ?  ?  ?  ?Balance   ?  ?  ?  ?  ?  ?  ?  ?  ?  ?  ?  ?  ?  ?  ?  ?  ?  ?  ?   ? ?ADL either performed or assessed with clinical judgement  ? ?ADL Overall ADL's : Needs assistance/impaired ?Eating/Feeding: Bed level;Supervision/ safety;Set up;Cueing for safety ?Eating/Feeding Details (indicate cue type and reason): bed in sitting position, pt set up with holding small pill cup with crushed meds mixed with applesauce from RN, holding cup with L hand while using R hand to scoop and self feed. No spillage, able to complete with initial CGA improving to supervision. ?Grooming: Dance movement psychotherapist;Wash/dry hands;Oral care;Bed level;Moderate assistance;Maximal assistance ?Grooming Details (indicate cue type and reason): bed in sitting position,  set up for washcloth, able to wash her entire face wiht R hand and when prompted to complete using her L hand required MOD-MAX A. Set up with loosened toothpaste lid, stabilized with L hand and pt able to twist off cap with R hand and squeeze onto her toothbrush. Brushed her teeth with supv using R hand, requiring MOD A to stabilize kidney bean basin to spit into usingL hand to hold. Set up to wash face  afterwards. ?  ?  ?  ?  ?  ?  ?  ?  ?  ?  ?  ?  ?  ?  ?  ?  ?  ? ?Extremity/Trunk Assessment   ?  ?  ?  ?  ?  ? ?Vision   ?  ?  ?Perception   ?  ?Praxis   ?  ? ?Cognition Arousal/Alertness: Awake/alert ?Behavior During Therapy: [facility] for tasks assessed/performed ?Overall Cognitive Status: No family/caregiver present to determine baseline cognitive functioning ?  ?  ?  ?  ?  ?  ?  ?  ?  ?  ?  ?  ?  ?  ?  ?  ?General Comments: alert and oriented grossly, follows simple commands well ?  ?  ?   ?Exercises   ? ?  ?Shoulder Instructions   ? ? ?  ?General Comments    ? ? ?Pertinent Vitals/ Pain       Pain Assessment ?Pain Assessment: No/denies pain ? ?Home Living   ?  ?  ?  ?  ?  ?  ?  ?  ?  ?  ?  ?  ?  ?  ?  ?  ?  ?  ? ?  ?Prior Functioning/Environment    ?  ?  ?  ?   ? ?Frequency ? Min 2X/week  ? ? ? ? ?  ?Progress Toward Goals ? ?OT Goals(current goals can now be found in the care plan section) ? Progress towards OT goals: Progressing toward goals ? ?Acute Rehab OT Goals ?Patient Stated Goal: get better ?OT Goal Formulation: With patient ?Time For Goal Achievement: 07/06/21 ?Potential to Achieve Goals: Good  ?Plan Discharge plan remains appropriate;Frequency remains appropriate   ? ?Co-evaluation ? ? ?   ?  ?  ?  ?  ? ?  ?AM-PAC OT "6 Clicks" Daily Activity     ?Outcome Measure ? ? Help from another person eating meals?: A Little ?Help from another person taking care of personal grooming?: A Little ?Help from another person toileting, which includes using toliet, bedpan, or urinal?: A Lot ?Help from another person bathing (including washing, rinsing, drying)?: A Lot ?Help from another person to put on and taking off regular upper body clothing?: A Lot ?Help from another person to put on and taking off regular lower body clothing?: A Lot ?6 Click Score: 14 ? ?  ?End of Session   ? ?OT Visit Diagnosis: Other abnormalities of gait and mobility (R26.89);Muscle weakness (generalized) (M62.81);Other symptoms and signs involving  cognitive function ?  ?Activity Tolerance Patient tolerated treatment well ?  ?Patient Left in bed;with call bell/phone within reach;with bed alarm set ?  ?Nurse Communication   ?  ? ?   ? ?Time: 1006-1030 ?OT Time Calculation (min): 24 min ? ?Charges: OT General Charges ?$OT Visit: 1 Visit ?OT Treatments ?$Self Care/Home Management : 23-37 mins ? ?Ardeth Perfect., MPH, MS, OTR/L ?ascom [phone] ?06/23/21, 1:05 PM ? ?

## 2021-06-24 DIAGNOSIS — G9341 Metabolic encephalopathy: Secondary | ICD-10-CM | POA: Diagnosis not present

## 2021-06-24 LAB — GLUCOSE, CAPILLARY
Glucose-Capillary: 199 mg/dL — ABNORMAL HIGH (ref 70–99)
Glucose-Capillary: 276 mg/dL — ABNORMAL HIGH (ref 70–99)
Glucose-Capillary: 286 mg/dL — ABNORMAL HIGH (ref 70–99)
Glucose-Capillary: 313 mg/dL — ABNORMAL HIGH (ref 70–99)

## 2021-06-24 MED ORDER — INSULIN GLARGINE-YFGN 100 UNIT/ML ~~LOC~~ SOLN
8.0000 [IU] | Freq: Two times a day (BID) | SUBCUTANEOUS | Status: DC
  Administered 2021-06-24 – 2021-06-25 (×4): 8 [IU] via SUBCUTANEOUS
  Filled 2021-06-24 (×5): qty 0.08

## 2021-06-24 MED ORDER — AMLODIPINE BESYLATE 10 MG PO TABS
10.0000 mg | ORAL_TABLET | Freq: Every day | ORAL | Status: DC
  Administered 2021-06-24 – 2021-06-27 (×4): 10 mg via ORAL
  Filled 2021-06-24 (×4): qty 1

## 2021-06-24 NOTE — Progress Notes (Signed)
?Progress Note ? ? ?Patient: Cheyenne [name] DJM:[id number] DOB: 08/01/1950 DOA: 06/20/2021     3 ?DOS: the patient was seen and examined on 06/24/2021 ?  ?Brief hospital course: ?Taken from H&P. ? ?EVALENE VATH is a 71 y.o. female with medical history significant for DM, HTN, left hemiparesis from prior stroke, HLD,  who at baseline ambulates independently who was brought to the ED by EMS after her daughter found her sitting on the commode several hours after she was helped to the commode at 3 PM.  Patient had previously been complaining of weakness.  A code stroke was activated for NIHSS of 17.  Head CT showed a small hyperdense lesion.  No thrombolysis was given as patient was outside tPA window and due to likely alternative diagnosis.  Neurology telemetry consult was done and findings were found to be more consistent with toxic metabolic encephalopathy. ?Additional data review: BP in ED as high as 213/101 with otherwise normal vitals.  Blood work showed glucose of 255, VBG with PCO2 of 41, urinalysis consistent with UTI but labs otherwise unremarkable.  Chest x-ray showing developing right middle lobe infiltrate.  CT abdomen and pelvis with possible sigmoid colitis/diverticulitis, cystitis.  MRI brain as recommended by neurologist showed the following: ?IMPRESSION: ?1. Punctate focus of acute ischemia within the right parietal lobe. ?No hemorrhage or mass effect. ?2. 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 ?3. Moderate chronic microvascular ischemia and volume loss. ? ?Patient received Unasyn, azithromycin and Flagyl in ED.  Flagyl was discontinued as Unasyn can provide anaerobic coverage. ? ?Concern of UTI versus pneumonia.  Baseline procalcitonin negative.  UA with pyuria and urine cultures pending.  Unremarkable CBC.  Doubt it is pneumonia so we will discontinue azithromycin. ?Lipid panel without any significant  normality, LDL of 74. ?UDS negative. ? ?4/19: Patient remained quite encephalopathic and only showing minimum response to sternal rub.  Protecting airway.  Not following any commands today.  Unable to obtain swallow evaluation so we are keeping her n.p.o. until mental status improves. ?Blood pressure remained elevated, mostly above 200, added as needed labetalol and we will allow permissive hypertension up to 222 systolic. ?Patient also has an history of underlying dementia and cognitive impairment, per PCP note this is a struggle with altered mental status for a long time and they were recommending continue Depakote and olanzapine for now.  Her psychiatrist was recommending restarting Lexapro clonazepam due to aggressive behavior.  ? ?She was recently admitted at [facility] with bladder rupture s/p repair and now Foley is out and she is voiding on her own.  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  And she was discharged to rehab after that admission. ? ?Neurology was consulted-will appreciate their recommendations. ? ?4/20: Echocardiogram with normal EF and no other significant abnormality. ?EEG with with continuous generalized slowing/encephalopathy, nonspecific to etiology.  No seizure or epileptiform changes. ?Because of the concern of cerebral amyloid angiopathy and increased risk of ICH associated with it the neurology recommending discontinuing aspirin, Plavix and statin.  See full neurology consult note for detail.  Patient will need outpatient neurology evaluation for possible CAA as it can lead to dementia. ? ?CTA with unchanged small focus of hyperdensity in the posterior superior right frontal lobe suspicious for small amount of acute blood products, possibly related to underlying amyloid angiopathy. ?No emergent large vessel occlusion.  Please see full  report. ?Urine cultures negative.  Procalcitonin remain  negative. ? ?Patient became little more alert and following commands today.  Passed swallow evaluation and dysphagia 1 diet was ordered per recommendations. ? ?4/21: Patient appears to be back to her baseline.  Alert and oriented to self and place.  Eating and drinking okay.  PT/OT are recommending rehab.  Stable for discharge.  TOC is working on placement. ?Neurology recommending stopping aspirin and Plavix because of increased risk of hemorrhage with CVA.  She will need a outpatient neurology follow-up for further management. ?Urine cultures and procalcitonin remain negative, discontinuing antibiotics. ? ?4/22: Home dose of amlodipine was restarted, apparently patient stopped taking it. ?Also added Semglee for elevated CBG. ?Patient remained stable and at her baseline-waiting for SNF placement. ? ? ? ?Assessment and Plan: ?* Acute metabolic encephalopathy ?Resolved.  Patient appears to be at her baseline now ?Patient with advanced dementia with behavioral changes and prior history of becoming encephalopathic on different occasions. ?Can be due to UTI, less likely pneumonia as procalcitonin remain negative. ?UA with pyuria, urine cultures negative. ?MRI with punctate acute ischemia within the right parietal lobe with should not be responsible for this change in mental status. ?CTA with concern of small hemorrhage and no large vessel occlusion. ?Becoming more alert and following commands. ?Passed swallow evaluation-dysphagia 1 diet started ?-Fall and aspiration precautions ? ?Acute CVA (cerebrovascular accident) ([place]) ?Likely incidental finding based on review of neurology note.  Neurologic deficit more consistent with encephalopathy ?Head CT shows a small hyperdense lesion, confirmed on CT ?Multiple chronic microhemorrhages on MRIs consistent with amyloid. ?Neurology was consulted-appreciate their recommendations. ?Patient completed the stroke work-up ?Neurology stopped aspirin, Plavix and statin due to increased  risk of hemorrhage with CAA which is evident on her images. ?Patient will need outpatient neurology follow-up for further management. ?PT/OT are recommending SNF. ? ?  ? ?Urinary tract infection ?Urine cultures are negative. ?-Discontinue antibiotics ? ?Aspiration pneumonia ([place]) ?Less likely pneumonia as procalcitonin remain negative. ?Discontinue Zithromax ?Discontinue Unasyn ?Passed swallow evaluation- started on dysphagia 1 diet ? ?Hypertensive urgency ?Blood pressure remained elevated.  Completed permissive hypertension. ?-Continue home losartan ?-Start home amlodipine at 10 mg daily ?-As needed labetalol for systolic above 621. ? ?Colitis on CT ?She was placed on Flagyl in ED for concern of colitis. ?Later she received Unasyn which has been not discontinued. ? ?Uncontrolled type 2 diabetes mellitus with hyperglycemia, with long-term current use of insulin ([place]) ?CBG started trending up. ?-Add Semglee 8 units twice daily ?-Continue with SSI ? 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  Increase nursing assistance with transfers. ? ?  ? ?Subjective: Patient was seen and examined today.  She was sitting comfortably in recliner and watching TV.  Appears to be at her baseline.  No acute concerns. ? ?Physical Exam: ?Vitals:  ? 06/24/21 0017 06/24/21 0611 06/24/21 0805 06/24/21 1147  ?BP: (!) 174/92 (!) 181/60 (!) 158/77 (!) 187/72  ?Pulse: 74 60 67 63  ?Resp: '20 17 17 19  '$ ?Temp: (!) 97.5 ?F (36.4 ?C) 98 ?F (36.7 ?C) (!) 97.5 ?F (36.4 ?C) 98.1 ?F (36.7 ?C)  ?TempSrc: Oral     ?SpO2: 99% 99% 100% 100%  ?Weight:      ?Height:      ? ?General.     In no acute distress. ?Pulmonary.  Lungs clear bilaterally, normal respiratory effort. ?CV.  Regular rate and rhythm, no JVD, rub or  murmur. ?Abdomen.  Soft, nontender, nondistended, BS positive. ?CNS.  Alert and oriented .  No focal neurologic deficit. ?Extremities.  No edema,  no cyanosis, pulses intact and symmetrical. ?Psychiatry.  Judgment and insight appears normal. ? ?Data Reviewed: ?There are no new results to review at this time. ? ?Family Communication:  ? ?Disposition: ?Status is: [place]pat[place]

## 2021-06-24 NOTE — Assessment & Plan Note (Signed)
Blood pressure remained elevated.  Completed permissive hypertension. ?-Continue home losartan ?-Start home amlodipine at 10 mg daily ?-As needed labetalol for systolic above 763. ?

## 2021-06-24 NOTE — Assessment & Plan Note (Signed)
CBG started trending up. ?-Add Semglee 8 units twice daily ?-Continue with SSI ?

## 2021-06-25 DIAGNOSIS — G9341 Metabolic encephalopathy: Secondary | ICD-10-CM | POA: Diagnosis not present

## 2021-06-25 LAB — GLUCOSE, CAPILLARY
Glucose-Capillary: 245 mg/dL — ABNORMAL HIGH (ref 70–99)
Glucose-Capillary: 349 mg/dL — ABNORMAL HIGH (ref 70–99)
Glucose-Capillary: 364 mg/dL — ABNORMAL HIGH (ref 70–99)
Glucose-Capillary: 257 mg/dL — ABNORMAL HIGH (ref 70–99)

## 2021-06-25 MED ORDER — HYDRALAZINE HCL 50 MG PO TABS
25.0000 mg | ORAL_TABLET | Freq: Three times a day (TID) | ORAL | Status: DC
  Administered 2021-06-25 – 2021-06-27 (×7): 25 mg via ORAL
  Filled 2021-06-25 (×7): qty 1

## 2021-06-25 NOTE — Assessment & Plan Note (Signed)
Blood pressure remained elevated.  Completed permissive hypertension. ?-Add hydralazine 25 mg TID ?-Continue home losartan ?-Continue home amlodipine at 10 mg daily ?-As needed labetalol for systolic above 625. ?

## 2021-06-25 NOTE — Progress Notes (Signed)
?Progress Note ? ? ?Patient: Cheyenne [name]:[id number] DOB: [birth year] DOA: 06/20/2021     4 ?DOS: the patient was seen and examined on 06/25/2021 ?  ?Brief hospital course: ?Taken from H&P. ? ?Cheyenne [name] is a 71 y.o. female with medical history significant for DM, HTN, left hemiparesis from prior stroke, HLD,  who at baseline ambulates independently who was brought to the ED by EMS after her daughter found her sitting on the commode several hours after she was helped to the commode at 3 PM.  Patient had previously been complaining of weakness.  A code stroke was activated for NIHSS of 17.  Head CT showed a small hyperdense lesion.  No thrombolysis was given as patient was outside tPA window and due to likely alternative diagnosis.  Neurology telemetry consult was done and findings were found to be more consistent with toxic metabolic encephalopathy. ?Additional data review: BP in ED as high as 213/101 with otherwise normal vitals.  Blood work showed glucose of 255, VBG with PCO2 of 41, urinalysis consistent with UTI but labs otherwise unremarkable.  Chest x-ray showing developing right middle lobe infiltrate.  CT abdomen and pelvis with possible sigmoid colitis/diverticulitis, cystitis.  MRI brain as recommended by neurologist showed the following: ?IMPRESSION: ?1. Punctate focus of acute ischemia within the right parietal lobe. ?No hemorrhage or mass effect. ?2. 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 ?3. Moderate chronic microvascular ischemia and volume loss. ? ?Patient received Unasyn, azithromycin and Flagyl in ED.  Flagyl was discontinued as Unasyn can provide anaerobic coverage. ? ?Concern of UTI versus pneumonia.  Baseline procalcitonin negative.  UA with pyuria and urine cultures pending.  Unremarkable CBC.  Doubt it is pneumonia so we will discontinue azithromycin. ?Lipid panel without any significant  normality, LDL of 74. ?UDS negative. ? ?4/19: Patient remained quite encephalopathic and only showing minimum response to sternal rub.  Protecting airway.  Not following any commands today.  Unable to obtain swallow evaluation so we are keeping her n.p.o. until mental status improves. ?Blood pressure remained elevated, mostly above 200, added as needed labetalol and we will allow permissive hypertension up to 350 systolic. ?Patient also has an history of underlying dementia and cognitive impairment, per PCP note this is a struggle with altered mental status for a long time and they were recommending continue Depakote and olanzapine for now.  Her psychiatrist was recommending restarting Lexapro clonazepam due to aggressive behavior.  ? ?She was recently admitted at [facility] with bladder rupture s/p repair and now Foley is out and she is voiding on her own.  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  And she was discharged to rehab after that admission. ? ?Neurology was consulted-will appreciate their recommendations. ? ?4/20: Echocardiogram with normal EF and no other significant abnormality. ?EEG with with continuous generalized slowing/encephalopathy, nonspecific to etiology.  No seizure or epileptiform changes. ?Because of the concern of cerebral amyloid angiopathy and increased risk of ICH associated with it the neurology recommending discontinuing aspirin, Plavix and statin.  See full neurology consult note for detail.  Patient will need outpatient neurology evaluation for possible CAA as it can lead to dementia. ? ?CTA with unchanged small focus of hyperdensity in the posterior superior right frontal lobe suspicious for small amount of acute blood products, possibly related to underlying amyloid angiopathy. ?No emergent large vessel occlusion.  Please see full  report. ?Urine cultures negative.  Procalcitonin remain  negative. ? ?Patient became little more alert and following commands today.  Passed swallow evaluation and dysphagia 1 diet was ordered per recommendations. ? ?4/21: Patient appears to be back to her baseline.  Alert and oriented to self and place.  Eating and drinking okay.  PT/OT are recommending rehab.  Stable for discharge.  TOC is working on placement. ?Neurology recommending stopping aspirin and Plavix because of increased risk of hemorrhage with CVA.  She will need a outpatient neurology follow-up for further management. ?Urine cultures and procalcitonin remain negative, discontinuing antibiotics. ? ?4/22: Home dose of amlodipine was restarted, apparently patient stopped taking it. ?Also added Semglee for elevated CBG. ?Patient remained stable and at her baseline-waiting for SNF placement. ? ?4/23: Hydralazine added due to persistently elevated blood pressure. ? ? ?Assessment and Plan: ?* Acute metabolic encephalopathy ?Resolved.  Patient appears to be at her baseline now ?Patient with advanced dementia with behavioral changes and prior history of becoming encephalopathic on different occasions. ?Can be due to UTI, less likely pneumonia as procalcitonin remain negative. ?UA with pyuria, urine cultures negative. ?MRI with punctate acute ischemia within the right parietal lobe with should not be responsible for this change in mental status. ?CTA with concern of small hemorrhage and no large vessel occlusion. ?Becoming more alert and following commands. ?Passed swallow evaluation-dysphagia 1 diet started ?-Fall and aspiration precautions ? ?Acute CVA (cerebrovascular accident) ([place]) ?Likely incidental finding based on review of neurology note.  Neurologic deficit more consistent with encephalopathy ?Head CT shows a small hyperdense lesion, confirmed on CT ?Multiple chronic microhemorrhages on MRIs consistent with amyloid. ?Neurology was consulted-appreciate their recommendations. ?Patient completed the stroke  work-up ?Neurology stopped aspirin, Plavix and statin due to increased risk of hemorrhage with CAA which is evident on her images. ?Patient will need outpatient neurology follow-up for further management. ?PT/OT are recommending SNF. ? ?  ? ?Urinary tract infection ?Urine cultures are negative. ?-Discontinue antibiotics ? ?Aspiration pneumonia ([place]) ?Less likely pneumonia as procalcitonin remain negative. ?Discontinue Zithromax ?Discontinue Unasyn ?Passed swallow evaluation- started on dysphagia 1 diet ? ?Hypertensive urgency ?Blood pressure remained elevated.  Completed permissive hypertension. ?-Add hydralazine 25 mg TID ?-Continue home losartan ?-Continue home amlodipine at 10 mg daily ?-As needed labetalol for systolic above 350. ? ?Colitis on CT ?She was placed on Flagyl in ED for concern of colitis. ?Later she received Unasyn which has been not discontinued. ? ?Uncontrolled type 2 diabetes mellitus with hyperglycemia, with long-term current use of insulin ([place]) ?CBG started trending up. ?-Add Semglee 8 units twice daily ?-Continue with SSI ? ?Hemiparesis affecting left side as late effect of cerebrovascular accident (CVA) ([place]) ?Left-sided upper extremity weakness from prior stroke.  Increase nursing assistance with transfers. ? ?  ? ?Subjective: Patient was seen and examined today.  She was resting comfortably in bed.  No complaints or concerns. ? ?Physical Exam: ?Vitals:  ? 06/24/21 1624 06/24/21 2107 06/25/21 0459 06/25/21 0800  ?BP: (!) 156/92 (!) 174/72 (!) 182/66 131/84  ?Pulse: 76 80 63 (!) 56  ?Resp: '19 16 16   '$ ?Temp: 98.2 ?F (36.8 ?C) 98 ?F (36.7 ?C) 98.3 ?F (36.8 ?C) (!) 97 ?F (36.1 ?C)  ?TempSrc:    Axillary  ?SpO2: 100% 100% 98% 97%  ?Weight:      ?Height:      ? ?General.     In no acute distress. ?Pulmonary.  Lungs clear bilaterally, normal respiratory effort. ?CV.  Regular rate and rhythm,  no JVD, rub or murmur. ?Abdomen.  Soft, nontender, nondistended, BS positive. ?CNS.  Alert and oriented .   No focal neurologic deficit. ?Extremities.  No edema, no cyanosis, pulses intact and symmetrical. ?Psychiatry.  Judgment and insight appears normal. ? ?Data Reviewed: ?No new results to be reviewed. ? ?Family Communication:

## 2021-06-25 NOTE — Assessment & Plan Note (Signed)
Resolved.  Patient appears to be at her baseline now ?Patient with advanced dementia with behavioral changes and prior history of becoming encephalopathic on different occasions. ?Can be due to UTI, less likely pneumonia as procalcitonin remain negative. ?UA with pyuria, urine cultures negative. ?MRI with punctate acute ischemia within the right parietal lobe with should not be responsible for this change in mental status. ?CTA with concern of small hemorrhage and no large vessel occlusion. ?Becoming more alert and following commands. ?Passed swallow evaluation-dysphagia 1 diet started ?-Fall and aspiration precautions ?

## 2021-06-25 NOTE — Progress Notes (Signed)
Speech Language Pathology Treatment:    ?Patient Details ?Name: Cheyenne [name] ?MRN: [id number] ?DOB: 04-23-1950 ?Today's Date: 06/25/2021 ?Time: 0900-0910 ?SLP Time Calculation (min) (ACUTE ONLY): 10 min ? ?Assessment / Plan / Recommendation ?Clinical Impression ? Pt seen for diet tolerance and trials of upgraded textures. Pt received upright in bed and finished breakfast with nursing at bedside. Per nursing, pt tolerating pureed diet with thin liquids as well as meds whole with water without overt s/sx pharyngeal dysphagia. Pt alert, cooperative, and pleasantly confused.  ? ?Per chart review, temp and WBC WNL. No recent chest imaging. Pt on room air.  ? ?Pt observed with trials of thin liquids (via cup, straw; ~2 oz total), pureed (x3 bites from meal tray), and solid (saltine cracker broken in half). Pt with s/sx mild-moderate oral dysphagia c/b prolonged mastication and trace oral residual with solids which cleared with liquid wash. Oral deficits likely due to edentulism. Pharyngeal phase appeared [facility] per clinical assessment. No overt or subtle s/sx pharyngeal dysphagia. No change to vocal quality or respiratory status across trials.  ? ?Pt endorsed consuming "softer" food PTA due to dental status.  ? ?Recommend diet upgrade to Dysphagia 2 (chopped) diet with Thin Liquids and safe swallowing strategies/aspiration precautions as outlined below.  ? ?Pt is at remains increased risk for aspiration/aspiration PNA due to dental status, baseline cognitive-linguistic deficits, need for intermittent assistance with feeding, and medical comorbidities.  ? ?SLP to f/u per POC for diet tolerance and trials of upgraded textures as appropriate.  ? ?Pt and RN made aware of results, recommendations, and SLP POC. ?full understanding by pt. White board updated in pt's room for reinforcement by medical team.  ?  ?HPI HPI: Pt is a 71 y.o. female with medical history significant for DM, HTN, left hemiparesis from prior stroke, HLD,   who at baseline ambulates independently who was brought to the ED by EMS after her daughter found her sitting on the commode several hours after she was helped to the commode at 3 PM.  Patient had previously been complaining of weakness.  A code stroke was activated for NIHSS of 17.  Head CT showed a small hyperdense lesion.  No thrombolysis was given as patient was outside tPA window and due to likely alternative diagnosis.  Neurology telemetry consult was done and findings were found to be more consistent with toxic metabolic encephalopathy.  Head CT shows a small hyperdense lesion. no thrombolysis since patient is outside of treatment time window and alternative diagnosis. Patient signs and symptoms are consistent with toxic metabolic encephalopathy.  MRI: Punctate focus of acute ischemia within the right parietal lobe.  No hemorrhage or mass effect.  2. Innumerable chronic microhemorrhages in a predominantly  peripheral distribution, consistent with cerebral amyloid  angiopathy.  3. Moderate chronic microvascular ischemia and volume loss.  CXR: Interstitial prominence in the right lung, most pronounced in the  right lung base. This could reflect early infiltrate. ?  ?   ?SLP Plan ? Continue with current plan of care ? ?  ?  ?Recommendations for follow up therapy are one component of a multi-disciplinary discharge planning process, led by the attending physician.  Recommendations may be updated based on patient status, additional functional criteria and insurance authorization. ?  ? ?Recommendations  ?Diet recommendations: Dysphagia 2 (fine chop);Thin liquid ?Medication Administration: Whole meds with liquid (as tolerated) ?Supervision: Full supervision/cueing for compensatory strategies;Staff to assist with self feeding (assistance with feeding PRN) ?Compensations: Minimize environmental distractions;Slow rate;Small sips/bites (check  for oral clearance between bites and prior to leaving room) ?Postural Changes  and/or Swallow Maneuvers: Seated upright 90 degrees;Upright 30-60 min after meal  ?   ?    ?   ? ? ? ? Oral Care Recommendations: Oral care QID;Staff/trained caregiver to provide oral care ?Follow Up Recommendations: Skilled nursing-short term rehab (<3 hours/day) ?Assistance recommended at discharge: Frequent or constant Supervision/Assistance ?SLP Visit Diagnosis: Dysphagia, oropharyngeal phase (R13.12) ?Plan: Continue with current plan of care ? ? ? ? ?  ?  ? ?[name], M.S., CCC-SLP ?Speech-Language Pathologist ?[facility] ?[phone] ([place]) ? ?[name] ? ?06/25/2021, 10:11 AM ?

## 2021-06-26 DIAGNOSIS — G9341 Metabolic encephalopathy: Secondary | ICD-10-CM | POA: Diagnosis not present

## 2021-06-26 LAB — GLUCOSE, CAPILLARY
Glucose-Capillary: 274 mg/dL — ABNORMAL HIGH (ref 70–99)
Glucose-Capillary: 261 mg/dL — ABNORMAL HIGH (ref 70–99)
Glucose-Capillary: 262 mg/dL — ABNORMAL HIGH (ref 70–99)
Glucose-Capillary: 147 mg/dL — ABNORMAL HIGH (ref 70–99)

## 2021-06-26 MED ORDER — OLANZAPINE 5 MG PO TBDP
2.5000 mg | ORAL_TABLET | Freq: Once | ORAL | Status: AC
  Administered 2021-06-26: 2.5 mg via ORAL
  Filled 2021-06-26: qty 0.5

## 2021-06-26 MED ORDER — INSULIN GLARGINE-YFGN 100 UNIT/ML ~~LOC~~ SOLN
10.0000 [IU] | Freq: Two times a day (BID) | SUBCUTANEOUS | Status: DC
  Administered 2021-06-26 – 2021-06-27 (×3): 10 [IU] via SUBCUTANEOUS
  Filled 2021-06-26 (×4): qty 0.1

## 2021-06-26 MED ORDER — INSULIN ASPART 100 UNIT/ML IJ SOLN
5.0000 [IU] | Freq: Three times a day (TID) | INTRAMUSCULAR | Status: DC
  Administered 2021-06-26 – 2021-06-27 (×4): 5 [IU] via SUBCUTANEOUS
  Filled 2021-06-26 (×4): qty 1

## 2021-06-26 NOTE — Assessment & Plan Note (Signed)
Patient again little confused with some agitation, she wants to go see a friend who lives around the corner stating that she will help her but unable to explain what. ?Patient with advanced dementia with behavioral changes and prior history of becoming encephalopathic on different occasions. ?Can be due to UTI, less likely pneumonia as procalcitonin remain negative. ?UA with pyuria, urine cultures negative. ?MRI with punctate acute ischemia within the right parietal lobe with should not be responsible for this change in mental status. ?CTA with concern of small hemorrhage and no large vessel occlusion. ?Becoming more alert and following commands. ?Passed swallow evaluation-dysphagia 1 diet started ?-Fall and aspiration precautions ?

## 2021-06-26 NOTE — Progress Notes (Signed)
06/25/2021 at 1930: ? ?Pt has tried to ambulate independently twice while trying to receive bedside report from day shift RN. Pt educated on safety from falls and proper use of call bell. Bed is locked, in lowest position, with bed alarm on. Call bell within pt's reach.  ?

## 2021-06-26 NOTE — Progress Notes (Signed)
Occupational Therapy Treatment ?Patient Details ?Name: Cheyenne [name] ?MRN: [id number] ?DOB: 09-30-1950 ?Today's Date: 06/26/2021 ? ? ?History of present illness Pt is a 64 female that presented to ED for AMS, found on commode by family. Imaging showed punctate focus of acute ischemia within the right parietal lobe. No hemorrhage or mass effect, innumerable chronic microhemorrhages in a predominantly peripheral distribution, consistent with cerebral amyloid angiopathy. PMH of  DM, HTN, left hemiparesis from prior stroke, HLD, behavioral distrubance, endometrial cancer. ?  ?OT comments ? Pt seen for OT tx. Pt alert, oriented to self, and requires PRN VC for sequencing/safety during standing grooming tasks. Pt required CGA to stand at the sink to wash her hands/face and MIN A for thoroughness for combing her hair using her R hand. Pt fatigues quickly, requiring return to bed after <12mn standing. Pt progressing towards goals, however, a bit more confused this date. Pt continues to benefit from skilled OT services to maximize return to PLOF.   ? ?Recommendations for follow up therapy are one component of a multi-disciplinary discharge planning process, led by the attending physician.  Recommendations may be updated based on patient status, additional functional criteria and insurance authorization. ?   ?Follow Up Recommendations ? Skilled nursing-short term rehab (<3 hours/day)  ?  ?Assistance Recommended at Discharge Frequent or constant Supervision/Assistance  ?Patient can return home with the following ? A little help with walking and/or transfers;Assistance with cooking/housework;Assist for transportation;Help with stairs or ramp for entrance;Direct supervision/assist for medications management;Direct supervision/assist for financial management;A little help with bathing/dressing/bathroom ?  ?Equipment Recommendations ? BSC/3in1  ?  ?Recommendations for Other Services   ? ?  ?Precautions / Restrictions  Precautions ?Precautions: Fall ?Restrictions ?Weight Bearing Restrictions: No  ? ? ?  ? ?Mobility Bed Mobility ?Overal bed mobility: Needs Assistance ?Bed Mobility: Sit to Supine, Rolling, Sidelying to Sit ?Rolling: Min guard ?Sidelying to sit: Min guard ?  ?Sit to supine: Min guard ?  ?General bed mobility comments: back pain limiting her, cues for log roll to assist ?  ? ?Transfers ?Overall transfer level: Needs assistance ?Equipment used: 1 person hand held assist ?Transfers: Sit to/from Stand ?Sit to Stand: Min assist ?  ?  ?  ?  ?  ?  ?  ?  ?Balance Overall balance assessment: Needs assistance ?Sitting-balance support: Feet supported, Single extremity supported ?Sitting balance-Leahy Scale: Fair ?  ?  ?Standing balance support: Single extremity supported, No upper extremity supported, During functional activity ?Standing balance-Leahy Scale: Fair ?Standing balance comment: static standing balance fair, requires UE support on counter for dynamic standing balance ?  ?  ?  ?  ?  ?  ?  ?  ?  ?  ?  ?   ? ?ADL either performed or assessed with clinical judgement  ? ?ADL Overall ADL's : Needs assistance/impaired ?  ?  ?Grooming: WDance movement psychotherapistWash/dry hands;Standing;Min guard;Brushing hair;Minimal assistance ?Grooming Details (indicate cue type and reason): CGA to stand and wash her hands/face. MIN A for thoroughness for combing her hair using her R hand. ?  ?  ?  ?  ?  ?  ?  ?  ?  ?  ?  ?  ?  ?  ?  ?  ?  ? ?Extremity/Trunk Assessment   ?  ?  ?  ?  ?  ? ?Vision   ?  ?  ?Perception   ?  ?Praxis   ?  ? ?Cognition Arousal/Alertness: Awake/alert ?Behavior During Therapy: [facility]  tasks assessed/performed ?Overall Cognitive Status: No family/caregiver present to determine baseline cognitive functioning ?  ?  ?  ?  ?  ?  ?  ?  ?  ?  ?  ?  ?  ?  ?  ?  ?General Comments: pt a little confused, reports she's here for a school project, unable to state where she is. Follows commands with PRN VC. ?  ?  ?   ?Exercises   ? ?   ?Shoulder Instructions   ? ? ?  ?General Comments    ? ? ?Pertinent Vitals/ Pain       Pain Assessment ?Pain Assessment: Faces ?Faces Pain Scale: Hurts little more ?Pain Location: middle back ?Pain Descriptors / Indicators: Grimacing, Moaning ?Pain Intervention(s): Limited activity within patient's tolerance, Monitored during session, Repositioned ? ?Home Living   ?  ?  ?  ?  ?  ?  ?  ?  ?  ?  ?  ?  ?  ?  ?  ?  ?  ?  ? ?  ?Prior Functioning/Environment    ?  ?  ?  ?   ? ?Frequency ? Min 2X/week  ? ? ? ? ?  ?Progress Toward Goals ? ?OT Goals(current goals can now be found in the care plan section) ? Progress towards OT goals: Progressing toward goals ? ?Acute Rehab OT Goals ?Patient Stated Goal: get better ?OT Goal Formulation: With patient ?Time For Goal Achievement: 07/06/21 ?Potential to Achieve Goals: Good  ?Plan Discharge plan remains appropriate;Frequency remains appropriate   ? ?Co-evaluation ? ? ?   ?  ?  ?  ?  ? ?  ?AM-PAC OT "6 Clicks" Daily Activity     ?Outcome Measure ? ? Help from another person eating meals?: A Little ?Help from another person taking care of personal grooming?: A Little ?Help from another person toileting, which includes using toliet, bedpan, or urinal?: A Lot ?Help from another person bathing (including washing, rinsing, drying)?: A Lot ?Help from another person to put on and taking off regular upper body clothing?: A Lot ?Help from another person to put on and taking off regular lower body clothing?: A Lot ?6 Click Score: 14 ? ?  ?End of Session   ? ?OT Visit Diagnosis: Other abnormalities of gait and mobility (R26.89);Muscle weakness (generalized) (M62.81);Other symptoms and signs involving cognitive function ?  ?Activity Tolerance Patient tolerated treatment well ?  ?Patient Left in bed;with call bell/phone within reach;with bed alarm set ?  ?Nurse Communication   ?  ? ?   ? ?Time: 7628-3151 ?OT Time Calculation (min): 10 min ? ?Charges: OT General Charges ?$OT Visit: 1 Visit ?OT  Treatments ?$Self Care/Home Management : 8-22 mins ? ?Ardeth Perfect., MPH, MS, OTR/L ?ascom [phone] ?06/26/21, 4:40 PM ? ?

## 2021-06-26 NOTE — Progress Notes (Signed)
Physical Therapy Treatment ?Patient Details ?Name: Cheyenne [name] ?MRN: [id number] ?DOB: [birth year] ?Today's Date: 06/26/2021 ? ? ?History of Present Illness Pt is a 74 female that presented to ED for AMS, found on commode by family. Imaging showed punctate focus of acute ischemia within the right parietal lobe. No hemorrhage or mass effect, innumerable chronic microhemorrhages in a predominantly peripheral distribution, consistent with cerebral amyloid angiopathy. PMH of  DM, HTN, left hemiparesis from prior stroke, HLD, behavioral distrubance, endometrial cancer. ? ?  ?PT Comments  ? ? Pt in bed, ready for session.  To EOB with supervision for safety.  Steady in sitting.  She is able to walk to bathroom to void but does need assist for clothing management and balance.  She completes x 1 lap on unit with RW and min guard/assist with cues and light min assist to correct walker from hitting objects in hallway.  After short seated rests, stands for BLE ex with walker support. ? ?Pt continues to make good strides in her mobility.  She does demonstrate decreased safety awareness -> running into objects in hallway and poor awareness walking very close to people in hallway needing cues for awareness.  Balance deficits with min guard to correct imbalances with turns and ADL tasks.  She does endorse feeling better but still feels a bit "mixed up."   SNF remains a good option for transition home.  If SNF is not an option for her, increased support and +1 at all times for mobility and safety would be necessary initially at discharge along with HHPT.  She reports being independent prior to admission with no RW use per chart. ?  ?Recommendations for follow up therapy are one component of a multi-disciplinary discharge planning process, led by the attending physician.  Recommendations may be updated based on patient status, additional functional criteria and insurance authorization. ? ?Follow Up Recommendations ? Skilled  nursing-short term rehab (<3 hours/day) ?  ?  ?Assistance Recommended at Discharge Frequent or constant Supervision/Assistance  ?Patient can return home with the following A little help with walking and/or transfers;A little help with bathing/dressing/bathroom;Assistance with cooking/housework;Direct supervision/assist for financial management;Help with stairs or ramp for entrance;Assist for transportation ?  ?Equipment Recommendations ? Rollator (4 wheels);BSC/3in1  ?  ?Recommendations for Other Services   ? ? ?  ?Precautions / Restrictions Precautions ?Precautions: Fall ?Restrictions ?Weight Bearing Restrictions: No  ?  ? ?Mobility ? Bed Mobility ?Overal bed mobility: Needs Assistance ?Bed Mobility: Supine to Sit ?  ?  ?Supine to sit: Min guard ?  ?  ?  ?  ? ?Transfers ?Overall transfer level: Needs assistance ?Equipment used: Rolling walker (2 wheels) ?  ?Sit to Stand: Min assist ?  ?  ?  ?  ?  ?General transfer comment: vc's for hand placements and safety ?  ? ?Ambulation/Gait ?Ambulation/Gait assistance: Min assist, Min guard ?Gait Distance (Feet): 180 Feet ?Assistive device: Rolling walker (2 wheels) ?Gait Pattern/deviations: Step-through pattern, Narrow base of support ?Gait velocity: decreased ?  ?  ?General Gait Details: completed lap on unit ? ? ?Stairs ?  ?  ?  ?  ?  ? ? ?Wheelchair Mobility ?  ? ?Modified Rankin (Stroke Patients Only) ?  ? ? ?  ?Balance Overall balance assessment: Needs assistance ?Sitting-balance support: Feet supported ?Sitting balance-Leahy Scale: Fair ?  ?  ?Standing balance support: Reliant on assistive device for balance, Bilateral upper extremity supported ?Standing balance-Leahy Scale: Fair ?  ?  ?  ?  ?  ?  ?  ?  ?  ?  ?  ?  ?  ? ?  ?  Cognition Arousal/Alertness: Awake/alert ?Behavior During Therapy: [facility] for tasks assessed/performed ?Overall Cognitive Status: Within Functional Limits for tasks assessed ?  ?  ?  ?  ?  ?  ?  ?  ?  ?  ?  ?  ?  ?  ?  ?  ?General Comments: reports  still feeling a bit confused and "mixed up" ?  ?  ? ?  ?Exercises Other Exercises ?Other Exercises: standing AROM BLE with walker support ? ?  ?General Comments   ?  ?  ? ?Pertinent Vitals/Pain Pain Assessment ?Pain Assessment: No/denies pain  ? ? ?Home Living   ?  ?  ?  ?  ?  ?  ?  ?  ?  ?   ?  ?Prior Function    ?  ?  ?   ? ?PT Goals (current goals can now be found in the care plan section) Progress towards PT goals: Progressing toward goals ? ?  ?Frequency ? ? ? Min 2X/week ? ? ? ?  ?PT Plan Current plan remains appropriate  ? ? ?Co-evaluation   ?  ?  ?  ?  ? ?  ?AM-PAC PT "6 Clicks" Mobility   ?Outcome Measure ? Help needed turning from your back to your side while in a flat bed without using bedrails?: None ?Help needed moving from lying on your back to sitting on the side of a flat bed without using bedrails?: A Little ?Help needed moving to and from a bed to a chair (including a wheelchair)?: A Little ?Help needed standing up from a chair using your arms (e.g., wheelchair or bedside chair)?: A Little ?Help needed to walk in hospital room?: A Little ?Help needed climbing 3-5 steps with a railing? : A Little ?6 Click Score: 19 ? ?  ?End of Session Equipment Utilized During Treatment: Gait belt ?Activity Tolerance: Patient tolerated treatment well ?Patient left: in chair;with call bell/phone within reach;with chair alarm set ?Nurse Communication: Mobility status ?PT Visit Diagnosis: Other abnormalities of gait and mobility (R26.89);Difficulty in walking, not elsewhere classified (R26.2);Muscle weakness (generalized) (M62.81) ?  ? ? ?Time: 7989-2119 ?PT Time Calculation (min) (ACUTE ONLY): 24 min ? ?Charges:  $Gait Training: 8-22 mins ?$Therapeutic Exercise: 8-22 mins          ?         Chesley Noon, PTA ?06/26/21, 9:53 AM ? ?

## 2021-06-26 NOTE — Progress Notes (Signed)
?Progress Note ? ? ?Patient: Cheyenne [name] DJS:[id number] DOB: 1950-07-19 DOA: 06/20/2021     5 ?DOS: the patient was seen and examined on 06/26/2021 ?  ?Brief hospital course: ?Taken from H&P. ? ?Cheyenne [name] is a 71 y.o. female with medical history significant for DM, HTN, left hemiparesis from prior stroke, HLD,  who at baseline ambulates independently who was brought to the ED by EMS after her daughter found her sitting on the commode several hours after she was helped to the commode at 3 PM.  Patient had previously been complaining of weakness.  A code stroke was activated for NIHSS of 17.  Head CT showed a small hyperdense lesion.  No thrombolysis was given as patient was outside tPA window and due to likely alternative diagnosis.  Neurology telemetry consult was done and findings were found to be more consistent with toxic metabolic encephalopathy. ?Additional data review: BP in ED as high as 213/101 with otherwise normal vitals.  Blood work showed glucose of 255, VBG with PCO2 of 41, urinalysis consistent with UTI but labs otherwise unremarkable.  Chest x-ray showing developing right middle lobe infiltrate.  CT abdomen and pelvis with possible sigmoid colitis/diverticulitis, cystitis.  MRI brain as recommended by neurologist showed the following: ?IMPRESSION: ?1. Punctate focus of acute ischemia within the right parietal lobe. ?No hemorrhage or mass effect. ?2. 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 ?3. Moderate chronic microvascular ischemia and volume loss. ? ?Patient received Unasyn, azithromycin and Flagyl in ED.  Flagyl was discontinued as Unasyn can provide anaerobic coverage. ? ?Concern of UTI versus pneumonia.  Baseline procalcitonin negative.  UA with pyuria and urine cultures pending.  Unremarkable CBC.  Doubt it is pneumonia so we will discontinue azithromycin. ?Lipid panel without any significant  normality, LDL of 74. ?UDS negative. ? ?4/19: Patient remained quite encephalopathic and only showing minimum response to sternal rub.  Protecting airway.  Not following any commands today.  Unable to obtain swallow evaluation so we are keeping her n.p.o. until mental status improves. ?Blood pressure remained elevated, mostly above 200, added as needed labetalol and we will allow permissive hypertension up to 885 systolic. ?Patient also has an history of underlying dementia and cognitive impairment, per PCP note this is a struggle with altered mental status for a long time and they were recommending continue Depakote and olanzapine for now.  Her psychiatrist was recommending restarting Lexapro clonazepam due to aggressive behavior.  ? ?She was recently admitted at [facility] with bladder rupture s/p repair and now Foley is out and she is voiding on her own.  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  And she was discharged to rehab after that admission. ? ?Neurology was consulted-will appreciate their recommendations. ? ?4/20: Echocardiogram with normal EF and no other significant abnormality. ?EEG with with continuous generalized slowing/encephalopathy, nonspecific to etiology.  No seizure or epileptiform changes. ?Because of the concern of cerebral amyloid angiopathy and increased risk of ICH associated with it the neurology recommending discontinuing aspirin, Plavix and statin.  See full neurology consult note for detail.  Patient will need outpatient neurology evaluation for possible CAA as it can lead to dementia. ? ?CTA with unchanged small focus of hyperdensity in the posterior superior right frontal lobe suspicious for small amount of acute blood products, possibly related to underlying amyloid angiopathy. ?No emergent large vessel occlusion.  Please see full  report. ?Urine cultures negative.  Procalcitonin remain  negative. ? ?Patient became little more alert and following commands today.  Passed swallow evaluation and dysphagia 1 diet was ordered per recommendations. ? ?4/21: Patient appears to be back to her baseline.  Alert and oriented to self and place.  Eating and drinking okay.  PT/OT are recommending rehab.  Stable for discharge.  TOC is working on placement. ?Neurology recommending stopping aspirin and Plavix because of increased risk of hemorrhage with CVA.  She will need a outpatient neurology follow-up for further management. ?Urine cultures and procalcitonin remain negative, discontinuing antibiotics. ? ?4/22: Home dose of amlodipine was restarted, apparently patient stopped taking it. ?Also added Semglee for elevated CBG. ?Patient remained stable and at her baseline-waiting for SNF placement. ? ?4/23: Hydralazine added due to persistently elevated blood pressure. ? ?4/24: 1 extra dose of Zyprexa given for some agitation.  Patient appears little confused this morning and asking for a friend who lives around the corner. ?Insurance authorization for rehab is still pending. ? ? ?Assessment and Plan: ?* Acute metabolic encephalopathy ?Patient again little confused with some agitation, she wants to go see a friend who lives around the corner stating that she will help her but unable to explain what. ?Patient with advanced dementia with behavioral changes and prior history of becoming encephalopathic on different occasions. ?Can be due to UTI, less likely pneumonia as procalcitonin remain negative. ?UA with pyuria, urine cultures negative. ?MRI with punctate acute ischemia within the right parietal lobe with should not be responsible for this change in mental status. ?CTA with concern of small hemorrhage and no large vessel occlusion. ?Becoming more alert and following commands. ?Passed swallow evaluation-dysphagia 1 diet started ?-Fall and aspiration precautions ? ?Acute CVA (cerebrovascular accident) ([place]) ?Likely  incidental finding based on review of neurology note.  Neurologic deficit more consistent with encephalopathy ?Head CT shows a small hyperdense lesion, confirmed on CT ?Multiple chronic microhemorrhages on MRIs consistent with amyloid. ?Neurology was consulted-appreciate their recommendations. ?Patient completed the stroke work-up ?Neurology stopped aspirin, Plavix and statin due to increased risk of hemorrhage with CAA which is evident on her images. ?Patient will need outpatient neurology follow-up for further management. ?PT/OT are recommending SNF. ? ?  ? ?Urinary tract infection ?Urine cultures are negative. ?-Discontinue antibiotics ? ?Aspiration pneumonia ([place]) ?Less likely pneumonia as procalcitonin remain negative. ?Discontinue Zithromax ?Discontinue Unasyn ?Passed swallow evaluation- started on dysphagia 1 diet ? ?Hypertensive urgency ?Blood pressure seems improving.  Completed permissive hypertension. ?-Continue hydralazine 25 mg TID ?-Continue home losartan ?-Continue home amlodipine at 10 mg daily ?-As needed labetalol for systolic above 235. ? ?Colitis on CT ?She was placed on Flagyl in ED for concern of colitis. ?Later she received Unasyn which has been not discontinued. ? ?Uncontrolled type 2 diabetes mellitus with hyperglycemia, with long-term current use of insulin ([place]) ?CBG elevated ?-Increase Semglee 10 units twice daily ?-Continue with SSI ?-Add 5 units of NovoLog for mealtime coverage ? ?Hemiparesis affecting left side as late effect of cerebrovascular accident (CVA) ([place]) ?Left-sided upper extremity weakness from prior stroke.  Increase nursing assistance with transfers. ? ?  ? ?Subjective: Patient was seen and examined today.  She was oriented to self only and asking about a friend, she wants to see her to get some help but unable to explain what.  Despite telling multiple times that she is in hospital she did not really register. ? ?Physical Exam: ?Vitals:  ? 06/26/21 0526 06/26/21 0526  06/26/21 0723 06/26/21  1240  ?BP: (!) 131/53 (!) 131/53 122/64 (!) 157/70  ?Pulse: 74 74 62 92  ?Resp: '18 18 16 18  '$ ?Temp: 98.3 ?F (36.8 ?C) 98.3 ?F (36.8 ?C) 97.9 ?F (36.6 ?C)   ?TempSrc: Oral  Oral   ?SpO2: 97% 97% 99% 99%  ?W

## 2021-06-26 NOTE — Assessment & Plan Note (Signed)
CBG elevated ?-Increase Semglee 10 units twice daily ?-Continue with SSI ?-Add 5 units of NovoLog for mealtime coverage ?

## 2021-06-26 NOTE — Assessment & Plan Note (Signed)
Blood pressure seems improving.  Completed permissive hypertension. ?-Continue hydralazine 25 mg TID ?-Continue home losartan ?-Continue home amlodipine at 10 mg daily ?-As needed labetalol for systolic above 202. ?

## 2021-06-26 NOTE — Progress Notes (Signed)
Speech Language Pathology Treatment:    ?Patient Details ?Name: Cheyenne [name] ?MRN: [id number] ?DOB: 11/02/1950 ?Today's Date: 06/26/2021 ?Time: 0855-0905 ?SLP Time Calculation (min) (ACUTE ONLY): 10 min ? ?Assessment / Plan / Recommendation ?Clinical Impression ? Pt seen for diet tolerance. Pt alert, pleasantly confused, and cooperative. Pt consuming breakfast upon SLP entrance to room.  ? ?Pt observed with meal tray consisting of scrambled eggs, chopped sausage, cream of wheat, coffee, and water (with straw). Pt demonstrated a grossly functional oral swallow with meal - trace oral residual noted with eggs which pt independently utilized a liquid wash to clear. No overt or subtle s/sx pharyngeal dysphagia noted.  ? ?Recommend continuation of Dysphagia 2 Diet (Chopped) with Thin Liquids and safe swallowing strategies/aspiratoin precautions as outlined below.  ? ?SLP to sign off at this time as pt has no acute SLP needs.  ? ?Pt and RN made aware of results, recommendations, and SLP POC. Pt verbalized understanding/agreement; however, reinforcement may be necessary given confusion.  ? ?  ?HPI HPI: Pt is a 71 y.o. female with medical history significant for DM, HTN, left hemiparesis from prior stroke, HLD,  who at baseline ambulates independently who was brought to the ED by EMS after her daughter found her sitting on the commode several hours after she was helped to the commode at 3 PM.  Patient had previously been complaining of weakness.  A code stroke was activated for NIHSS of 17.  Head CT showed a small hyperdense lesion.  No thrombolysis was given as patient was outside tPA window and due to likely alternative diagnosis.  Neurology telemetry consult was done and findings were found to be more consistent with toxic metabolic encephalopathy.  Head CT shows a small hyperdense lesion. no thrombolysis since patient is outside of treatment time window and alternative diagnosis. Patient signs and symptoms are  consistent with toxic metabolic encephalopathy.  MRI: Punctate focus of acute ischemia within the right parietal lobe.  No hemorrhage or mass effect.  2. Innumerable chronic microhemorrhages in a predominantly  peripheral distribution, consistent with cerebral amyloid  angiopathy.  3. Moderate chronic microvascular ischemia and volume loss.  CXR: Interstitial prominence in the right lung, most pronounced in the  right lung base. This could reflect early infiltrate. ?  ?   ?SLP Plan ? All goals met;Goals updated ? ?  ?  ?Recommendations for follow up therapy are one component of a multi-disciplinary discharge planning process, led by the attending physician.  Recommendations may be updated based on patient status, additional functional criteria and insurance authorization. ?  ? ?Recommendations  ?Diet recommendations: Dysphagia 2 (fine chop);Thin liquid ?Medication Administration: Whole meds with liquid ?Supervision: Full supervision/cueing for compensatory strategies;Staff to assist with self feeding (set up assistance with meals) ?Compensations: Minimize environmental distractions;Slow rate;Small sips/bites;Follow solids with liquid ?Postural Changes and/or Swallow Maneuvers: Seated upright 90 degrees;Upright 30-60 min after meal  ?   ?    ?   ? ? ? ? Oral Care Recommendations: Oral care QID;Staff/trained caregiver to provide oral care ?Assistance recommended at discharge: Frequent or constant Supervision/Assistance ?SLP Visit Diagnosis: Dysphagia, oropharyngeal phase (R13.12) ?Plan: All goals met;Goals updated ? ? ? ? ?  ?  ? ? , M.S., CCC-SLP ?Speech-Language Pathologist ?[facility] ?[phone] (ASCOM) ? ? M  ? ?06/26/2021, 9:53 AM ?

## 2021-06-26 NOTE — TOC Progression Note (Addendum)
Transition of Care (TOC) - Progression Note  ? ? ?Patient Details  ?Name: Cheyenne [name] ?MRN: [id number] ?Date of Birth: November 21, 1950 ? ?Transition of Care (TOC) CM/SW Contact  ?Candie Chroman, LCSW ?Phone Number: ?06/26/2021, 8:25 AM ? ?Clinical Narrative:  Per CMA, insurance authorization is still pending.  ? ?1:20 pm: Insurance authorization still pending. ? ?Expected Discharge Plan: [place] ?Barriers to Discharge: Continued Medical Work up, Ship broker ? ?Expected Discharge Plan and Services ?Expected Discharge Plan: [place] ?  ?  ?Post Acute Care Choice: [place] ?Living arrangements for the past 2 months: [place] ?                ?  ?  ?  ?  ?  ?  ?  ?  ?  ?  ? ? ?Social Determinants of Health (SDOH) Interventions ?  ? ?Readmission Risk Interventions ? ?  02/03/2021  ? 11:22 AM 01/25/2021  ?  4:22 PM 11/14/2020  ?  4:33 PM  ?Readmission Risk Prevention Plan  ?Medication Screening  Complete   ?Transportation Screening Complete Complete   ?Home Care Screening     ?Medication Review (RN CM)     ?[place] or Home Care Consult Complete    ?Social Work Consult for [place] Planning/Counseling Complete    ?Palliative Care Screening Not Applicable    ?Medication Review Press photographer) Complete    ?  ? Information is confidential and restricted. Go to Review Flowsheets to unlock data.  ? ? ?

## 2021-06-26 NOTE — Care Management Important Message (Signed)
Important Message ? ?Patient Details  ?Name: Cheyenne [name] ?MRN: [id number] ?Date of Birth: 09-16-1950 ? ? ?Medicare Important Message Given:  N/A - LOS <3 / Initial given by admissions ? ? ? ? ?Juliann Pulse A Allison Silva ?06/26/2021, 8:19 AM ?

## 2021-06-26 NOTE — Assessment & Plan Note (Signed)
Urine cultures are negative. ?-Discontinue antibiotics ?

## 2021-06-27 DIAGNOSIS — G9341 Metabolic encephalopathy: Secondary | ICD-10-CM | POA: Diagnosis not present

## 2021-06-27 LAB — GLUCOSE, CAPILLARY
Glucose-Capillary: 195 mg/dL — ABNORMAL HIGH (ref 70–99)
Glucose-Capillary: 208 mg/dL — ABNORMAL HIGH (ref 70–99)
Glucose-Capillary: 418 mg/dL — ABNORMAL HIGH (ref 70–99)

## 2021-06-27 MED ORDER — PROSOURCE PLUS PO LIQD: 30.0000 mL | Freq: Three times a day (TID) | ORAL | Status: AC

## 2021-06-27 MED ORDER — INSULIN ASPART 100 UNIT/ML IJ SOLN: 0.0000 [IU] | Freq: Three times a day (TID) | INTRAMUSCULAR | 11 refills | Status: AC

## 2021-06-27 MED ORDER — ADULT MULTIVITAMIN W/MINERALS CH: 1.0000 | ORAL_TABLET | Freq: Every day | ORAL | Status: AC

## 2021-06-27 NOTE — TOC Transition Note (Signed)
Transition of Care (TOC) - CM/SW Discharge Note ? ? ?Patient Details  ?Name: Cheyenne [name] ?MRN: [id number] ?Date of Birth: [birth year] ? ?Transition of Care (TOC) CM/SW Contact:  ?Candie Chroman, LCSW ?Phone Number: ?06/27/2021, 11:53 AM ? ? ?Clinical Narrative:  Patient has orders to discharge to [facility] SNF today. RN will call report to [phone] (Room 222B). EMS transport has been arranged and she is next on the list. No further concerns. CSW signing off.  ? ?Final next level of care: [place] ?Barriers to Discharge: Barriers Resolved ? ? ?Patient Goals and CMS Choice ?  ?  ?Choice offered to / list presented to :  (Granddaughter) ? ?Discharge Placement ?PASRR number recieved: 06/22/21 ?           ?Patient chooses bed at: [facility] ?Patient to be transferred to facility by: EMS ?Name of family member notified: Lanna Poche ?Patient and family notified of of transfer: 06/27/21 ? ?Discharge Plan and Services ?  ?  ?Post Acute Care Choice: [place]          ?  ?  ?  ?  ?  ?  ?  ?  ?  ?  ? ?Social Determinants of Health (SDOH) Interventions ?  ? ? ?Readmission Risk Interventions ? ?  02/03/2021  ? 11:22 AM 01/25/2021  ?  4:22 PM 11/14/2020  ?  4:33 PM  ?Readmission Risk Prevention Plan  ?Medication Screening  Complete   ?Transportation Screening Complete Complete   ?Home Care Screening     ?Medication Review (RN CM)     ?[place] or Home Care Consult Complete    ?Social Work Consult for [place] Planning/Counseling Complete    ?Palliative Care Screening Not Applicable    ?Medication Review Press photographer) Complete    ?  ? Information is confidential and restricted. Go to Review Flowsheets to unlock data.  ? ? ? ? ? ?

## 2021-06-27 NOTE — Progress Notes (Signed)
Report called  

## 2021-06-27 NOTE — TOC Progression Note (Addendum)
Transition of Care (TOC) - Progression Note  ? ? ?Patient Details  ?Name: Cheyenne [name] ?MRN: [id number] ?Date of Birth: 14-Jun-1950 ? ?Transition of Care (TOC) CM/SW Contact  ?Candie Chroman, LCSW ?Phone Number: ?06/27/2021, 8:24 AM ? ?Clinical Narrative:   Insurance authorization is approved: [id number]. Valid 4/25-4/27. [facility] admissions coordinator is aware and can accept her today. Sent secure chat to MD and RN to notify. ? ?10:40 am: Left voicemail for granddaughter. Will provide update when she calls back. ? ?Expected Discharge Plan: [place] ?Barriers to Discharge: Continued Medical Work up, Ship broker ? ?Expected Discharge Plan and Services ?Expected Discharge Plan: [place] ?  ?  ?Post Acute Care Choice: [place] ?Living arrangements for the past 2 months: [place] ?                ?  ?  ?  ?  ?  ?  ?  ?  ?  ?  ? ? ?Social Determinants of Health (SDOH) Interventions ?  ? ?Readmission Risk Interventions ? ?  02/03/2021  ? 11:22 AM 01/25/2021  ?  4:22 PM 11/14/2020  ?  4:33 PM  ?Readmission Risk Prevention Plan  ?Medication Screening  Complete   ?Transportation Screening Complete Complete   ?Home Care Screening     ?Medication Review (RN CM)     ?[place] or Home Care Consult Complete    ?Social Work Consult for [place] Planning/Counseling Complete    ?Palliative Care Screening Not Applicable    ?Medication Review Press photographer) Complete    ?  ? Information is confidential and restricted. Go to Review Flowsheets to unlock data.  ? ? ?

## 2021-06-27 NOTE — Discharge Summary (Signed)
?Physician Discharge Summary ?  ?Patient: Cheyenne [name] MRN: [id number] DOB: 09/17/1950  ?Admit date:     06/20/2021  ?Discharge date: 06/27/21  ?Discharge Physician: Lorella Nimrod  ? ?PCP: Toni Arthurs, PA  ? ?Recommendations at discharge:  ?Please obtain CBC and BMP within a week ?Follow-up with neurology within 1 to 2 weeks. ?Patient concerning for cerebral amyloid angiopathy-antiplatelets were discontinued due to increased risk of bleeding related to CAA, outpatient neurology will determine the further management. ?Follow-up with primary care provider within a week ? ?Discharge Diagnoses: ?Principal Problem: ?  Acute metabolic encephalopathy ?Active Problems: ?  Acute CVA (cerebrovascular accident) ([place]) ?  Urinary tract infection ?  Aspiration pneumonia ([place]) ?  Colitis on CT ?  Hypertensive urgency ?  Uncontrolled type 2 diabetes mellitus with hyperglycemia, with long-term current use of insulin ([place]) ?  Hemiparesis affecting left side as late effect of cerebrovascular accident (CVA) ([place]) ? ? ?Hospital Course: ?Taken from H&P. ? ?Cheyenne [name] is a 71 y.o. female with medical history significant for DM, HTN, left hemiparesis from prior stroke, HLD,  who at baseline ambulates independently who was brought to the ED by EMS after her daughter found her sitting on the commode several hours after she was helped to the commode at 3 PM.  Patient had previously been complaining of weakness.  A code stroke was activated for NIHSS of 17.  Head CT showed a small hyperdense lesion.  No thrombolysis was given as patient was outside tPA window and due to likely alternative diagnosis.  Neurology telemetry consult was done and findings were found to be more consistent with toxic metabolic encephalopathy. ?Additional data review: BP in ED as high as 213/101 with otherwise normal vitals.  Blood work showed glucose of 255, VBG with PCO2 of 41, urinalysis consistent with UTI but labs otherwise unremarkable.  Chest x-ray  showing developing right middle lobe infiltrate.  CT abdomen and pelvis with possible sigmoid colitis/diverticulitis, cystitis.  MRI brain as recommended by neurologist showed the following: ?IMPRESSION: ?1. Punctate focus of acute ischemia within the right parietal lobe. ?No hemorrhage or mass effect. ?2. 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 ?3. Moderate chronic microvascular ischemia and volume loss. ? ?Patient received Unasyn, azithromycin and Flagyl in ED.  Flagyl was discontinued as Unasyn can provide anaerobic coverage. ? ?Concern of UTI versus pneumonia.  Baseline procalcitonin negative.  UA with pyuria and urine cultures pending.  Unremarkable CBC.  Doubt it is pneumonia so we will discontinue azithromycin. ?Lipid panel without any significant normality, LDL of 74. ?UDS negative. ? ?4/19: Patient remained quite encephalopathic and only showing minimum response to sternal rub.  Protecting airway.  Not following any commands today.  Unable to obtain swallow evaluation so we are keeping her n.p.o. until mental status improves. ?Blood pressure remained elevated, mostly above 200, added as needed labetalol and we will allow permissive hypertension up to 782 systolic. ?Patient also has an history of underlying dementia and cognitive impairment, per PCP note this is a struggle with altered mental status for a long time and they were recommending continue Depakote and olanzapine for now.  Her psychiatrist was recommending restarting Lexapro clonazepam due to aggressive behavior.  ? ?She was recently admitted at [facility] with bladder rupture s/p repair and now Foley is out and she is voiding on her own.  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  And she was discharged to rehab after that admission. ? ?Neurology was consulted-will appreciate their  recommendations. ? ?4/20: Echocardiogram with normal EF and no other significant abnormality. ?EEG with with continuous generalized slowing/encephalopathy, nonspecific to etiology.  No seizure or epileptiform changes. ?Because of the concern of cerebral amyloid angiopathy and increased risk of ICH associated with it the neurology recommending discontinuing aspirin, Plavix and statin.  See full neurology consult note for detail.  Patient will need outpatient neurology evaluation for possible CAA as it can lead to dementia. ? ?CTA with unchanged small focus of hyperdensity in the posterior superior right frontal lobe suspicious for small amount of acute blood products, possibly related to underlying amyloid angiopathy. ?No emergent large vessel occlusion.  Please see full report. ?Urine cultures negative.  Procalcitonin remain negative. ? ?Patient became little more alert and following commands today.  Passed swallow evaluation and dysphagia 1 diet was ordered per recommendations. ? ?4/21: Patient appears to be back to her baseline.  Alert and oriented to self and place.  Eating and drinking okay.  PT/OT are recommending rehab.  Stable for discharge.  TOC is working on placement. ?Neurology recommending stopping aspirin and Plavix because of increased risk of hemorrhage with CVA.  She will need a outpatient neurology follow-up for further management. ?Urine cultures and procalcitonin remain negative, discontinuing antibiotics. ? ?4/22: Home dose of amlodipine was restarted, apparently patient stopped taking it. ?Also added Semglee for elevated CBG. ?Patient remained stable and at her baseline-waiting for SNF placement. ? ?4/23: Hydralazine added due to persistently elevated blood pressure. ? ?4/24: 1 extra dose of Zyprexa given for some agitation.  Patient appears little confused this morning and asking for a friend who lives around the corner. ?Insurance authorization for rehab is still pending. ? ?4/25: Patient  remained stable and is being discharged to rehab for further management as recommended by our physical therapist. ?We discontinued aspirin and Plavix for concern of possible CAA.  Patient need a close follow-up with outpatient neurology for further recommendations. ? ?Patient will continue with current medications and follow-up with her providers. ? ?Assessment and Plan: ?* Acute metabolic encephalopathy ?Patient again little confused with some agitation, she wants to go see a friend who lives around the corner stating that she will help her but unable to explain what. ?Patient with advanced dementia with behavioral changes and prior history of becoming encephalopathic on different occasions. ?Can be due to UTI, less likely pneumonia as procalcitonin remain negative. ?UA with pyuria, urine cultures negative. ?MRI with punctate acute ischemia within the right parietal lobe with should not be responsible for this change in mental status. ?CTA with concern of small hemorrhage and no large vessel occlusion. ?Becoming more alert and following commands. ?Passed swallow evaluation-dysphagia 1 diet started ?-Fall and aspiration precautions ? ?Acute CVA (cerebrovascular accident) ([place]) ?Likely incidental finding based on review of neurology note.  Neurologic deficit more consistent with encephalopathy ?Head CT shows a small hyperdense lesion, confirmed on CT ?Multiple chronic microhemorrhages on MRIs consistent with amyloid. ?Neurology was consulted-appreciate their recommendations. ?Patient completed the stroke work-up ?Neurology stopped aspirin, Plavix and statin due to increased risk of hemorrhage with CAA which is evident on her images. ?Patient will need outpatient neurology follow-up for further management. ?PT/OT are recommending SNF. ? ?  ? ?Urinary tract infection ?Urine cultures are negative. ?-Discontinue antibiotics ? ?Aspiration pneumonia ([place]) ?Less likely pneumonia as procalcitonin remain  negative. ?Discontinue Zithromax ?Discontinue Unasyn ?Passed swallow evaluation- started on dysphagia  1 diet ? ?Hypertensive urgency ?Blood pressure seems improving.  Completed permissive hypertension. ?-Continue hydralazine

## 2021-06-27 NOTE — Care Management Important Message (Signed)
Important Message ? ?Patient Details  ?Name: Cheyenne [name] ?MRN: [id number] ?Date of Birth: 14-Jul-1950 ? ? ?Medicare Important Message Given:  Yes ? ? ? ? ?[name] ?06/27/2021, 10:53 AM ?

## 2021-08-10 ENCOUNTER — Other Ambulatory Visit: Payer: Self-pay

## 2021-08-10 ENCOUNTER — Emergency Department
Admission: EM | Admit: 2021-08-10 | Discharge: 2021-08-16 | Disposition: A | Discharge status: Home / Self Care | Payer: Medicare HMO | Attending: Emergency Medicine | Admitting: Emergency Medicine

## 2021-08-10 ENCOUNTER — Emergency Department: Payer: Medicare HMO

## 2021-08-10 DIAGNOSIS — R262 Difficulty in walking, not elsewhere classified: Secondary | ICD-10-CM | POA: Diagnosis not present

## 2021-08-10 DIAGNOSIS — W19XXXA Unspecified fall, initial encounter: Secondary | ICD-10-CM | POA: Insufficient documentation

## 2021-08-10 DIAGNOSIS — Z9181 History of falling: Secondary | ICD-10-CM | POA: Diagnosis not present

## 2021-08-10 DIAGNOSIS — R296 Repeated falls: Secondary | ICD-10-CM

## 2021-08-10 DIAGNOSIS — I10 Essential (primary) hypertension: Secondary | ICD-10-CM | POA: Insufficient documentation

## 2021-08-10 DIAGNOSIS — E119 Type 2 diabetes mellitus without complications: Secondary | ICD-10-CM | POA: Diagnosis not present

## 2021-08-10 DIAGNOSIS — F0390 Unspecified dementia without behavioral disturbance: Secondary | ICD-10-CM | POA: Diagnosis present

## 2021-08-10 DIAGNOSIS — F03C0 Unspecified dementia, severe, without behavioral disturbance, psychotic disturbance, mood disturbance, and anxiety: Secondary | ICD-10-CM

## 2021-08-10 DIAGNOSIS — M6281 Muscle weakness (generalized): Secondary | ICD-10-CM | POA: Diagnosis not present

## 2021-08-10 LAB — COMPREHENSIVE METABOLIC PANEL
ALT: <5 U/L (ref 0–44)
AST: 32 U/L (ref 15–41)
Albumin: 3.5 g/dL (ref 3.5–5.0)
Alkaline Phosphatase: 91 U/L (ref 38–126)
Anion gap: 6 (ref 5–15)
BUN: 13 mg/dL (ref 8–23)
CO2: 24 mmol/L (ref 22–32)
Calcium: 8.7 mg/dL — ABNORMAL LOW (ref 8.9–10.3)
Chloride: 106 mmol/L (ref 98–111)
Creatinine, Ser: 0.67 mg/dL (ref 0.44–1.00)
GFR, Estimated: >60 mL/min (ref >60)
Glucose, Bld: 216 mg/dL — ABNORMAL HIGH (ref 70–99)
Potassium: 4.7 mmol/L (ref 3.5–5.1)
Sodium: 136 mmol/L (ref 135–145)
Total Bilirubin: 0.9 mg/dL (ref 0.3–1.2)
Total Protein: 7.1 g/dL (ref 6.5–8.1)

## 2021-08-10 LAB — URINALYSIS, ROUTINE W REFLEX MICROSCOPIC
Bilirubin Urine: NEGATIVE
Glucose, UA: >=500 mg/dL — AB
Hgb urine dipstick: NEGATIVE
Ketones, ur: 5 mg/dL — AB
Leukocytes,Ua: NEGATIVE
Nitrite: NEGATIVE
Protein, ur: 100 mg/dL — AB
Specific Gravity, Urine: 1.008 (ref 1.005–1.030)
pH: 7 (ref 5.0–8.0)

## 2021-08-10 LAB — CBC WITH DIFFERENTIAL/PLATELET
Abs Immature Granulocytes: 0.03 10*3/uL (ref 0.00–0.07)
Basophils Absolute: 0 10*3/uL (ref 0.0–0.1)
Basophils Relative: 0 %
Eosinophils Absolute: 0 10*3/uL (ref 0.0–0.5)
Eosinophils Relative: 0 %
HCT: 40.5 % (ref 36.0–46.0)
Hemoglobin: 13.2 g/dL (ref 12.0–15.0)
Immature Granulocytes: 0 %
Lymphocytes Relative: 16 %
Lymphs Abs: 1.1 10*3/uL (ref 0.7–4.0)
MCH: 27.9 pg (ref 26.0–34.0)
MCHC: 32.6 g/dL (ref 30.0–36.0)
MCV: 85.6 fL (ref 80.0–100.0)
Monocytes Absolute: 0.6 10*3/uL (ref 0.1–1.0)
Monocytes Relative: 8 %
Neutro Abs: 5.5 10*3/uL (ref 1.7–7.7)
Neutrophils Relative %: 76 %
Platelets: 203 10*3/uL (ref 150–400)
RBC: 4.73 MIL/uL (ref 3.87–5.11)
RDW: 13.5 % (ref 11.5–15.5)
WBC: 7.3 10*3/uL (ref 4.0–10.5)
nRBC: 0 % (ref 0.0–0.2)

## 2021-08-10 LAB — CK: Total CK: 79 U/L (ref 38–234)

## 2021-08-10 IMAGING — CT CT HEAD W/O CM
4 series · 16 of 47 positions shown, 18 images · non-contrast
Comparison: None Available.

CLINICAL DATA: Neck trauma (Age >= 65y) fall; fall



[Series 2: head wo · axial · 0.39mm/px · z∈[-131,-31]mm · 7 of 28 slices shown, 9 images]
[im 4/28  brain]
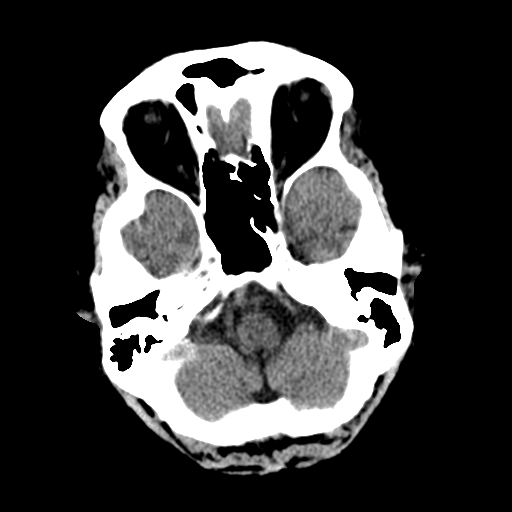
[im 4/28  bone]
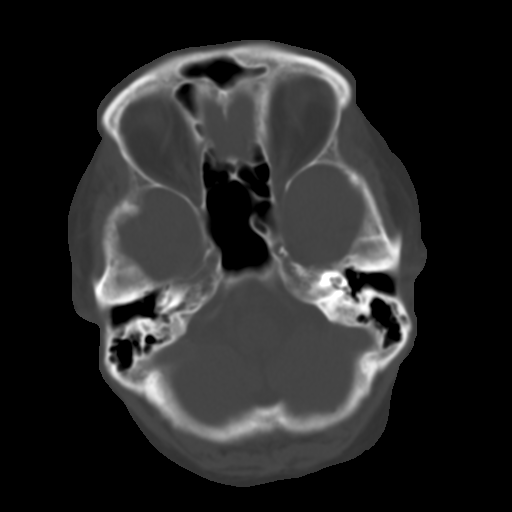
[im 7/28  brain]
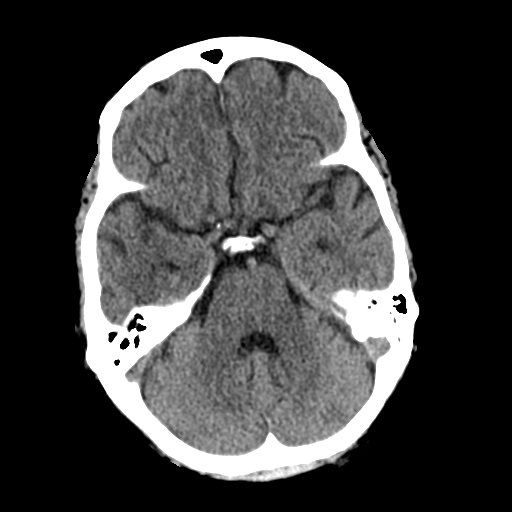
[im 11/28  brain]
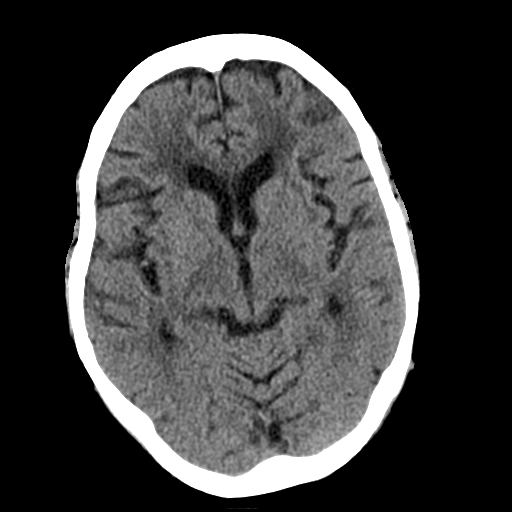
[im 14/28  brain]
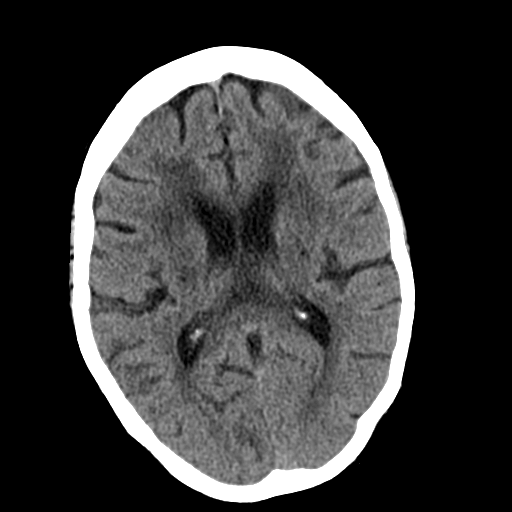
[im 17/28  brain]
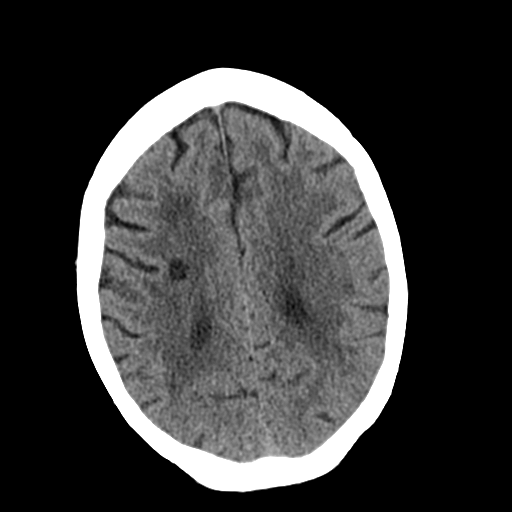
[im 17/28  bone]
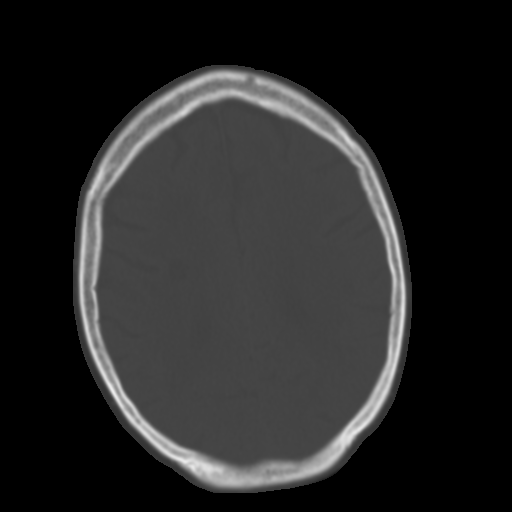
[im 21/28  brain]
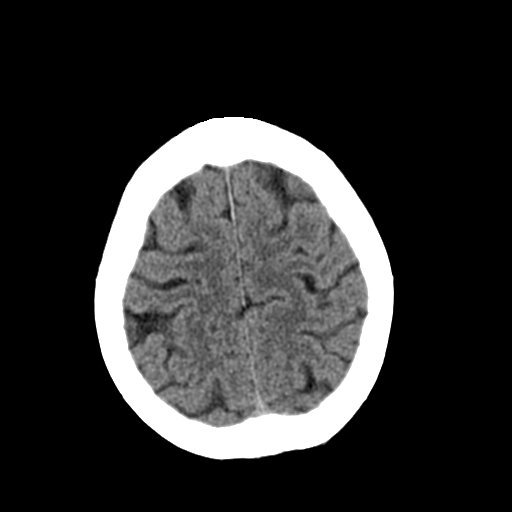
[im 24/28  brain]
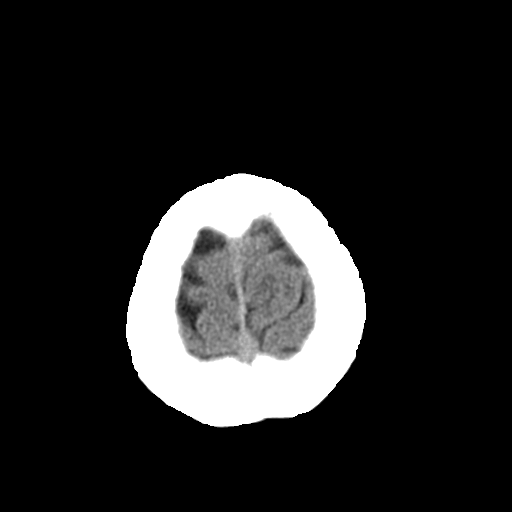

[Series 3: head bone · axial · 0.39mm/px · z∈[-134,-106]mm · 3 of 69 slices shown]
[im 7/69  bone]
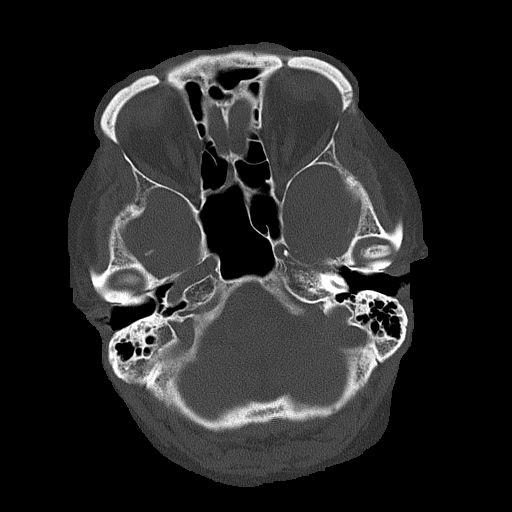
[im 14/69  bone]
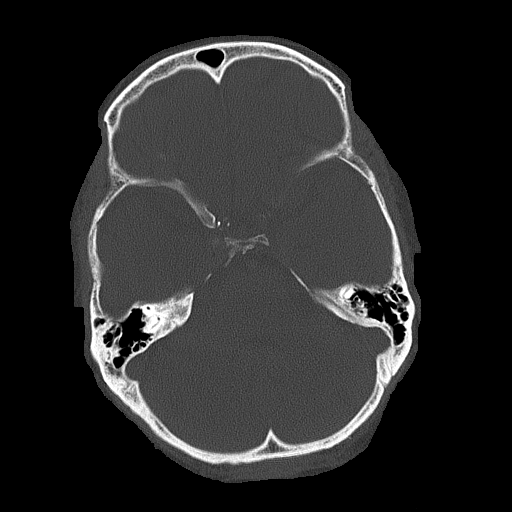
[im 21/69  bone]
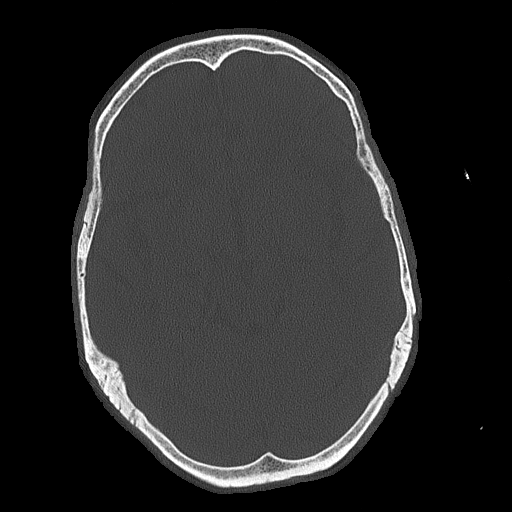

[Series 4: coronal soft tissue · coronal · 0.29mm/px · 3 of 63 slices shown]
[im 21/63  brain]
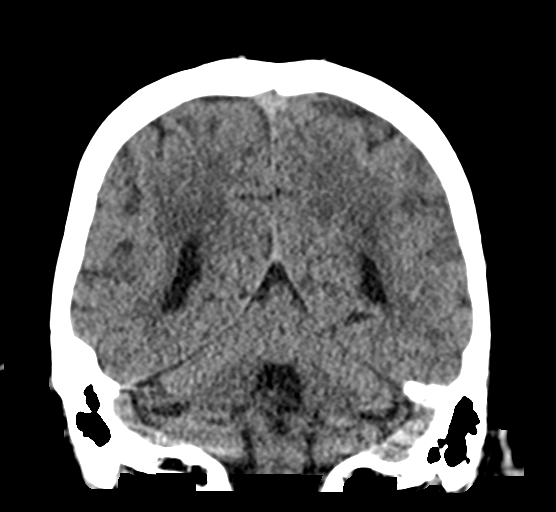
[im 28/63  brain]
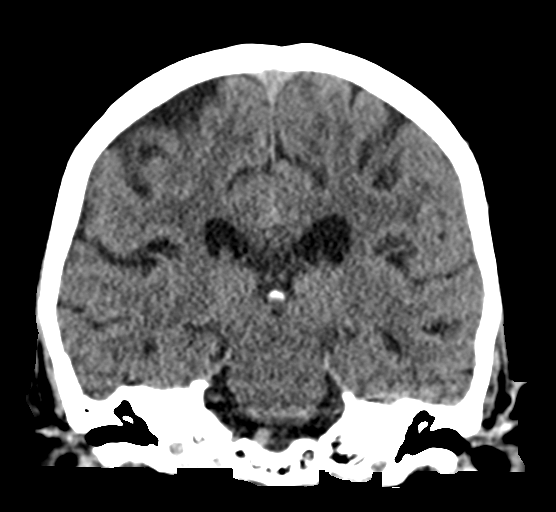
[im 35/63  brain]
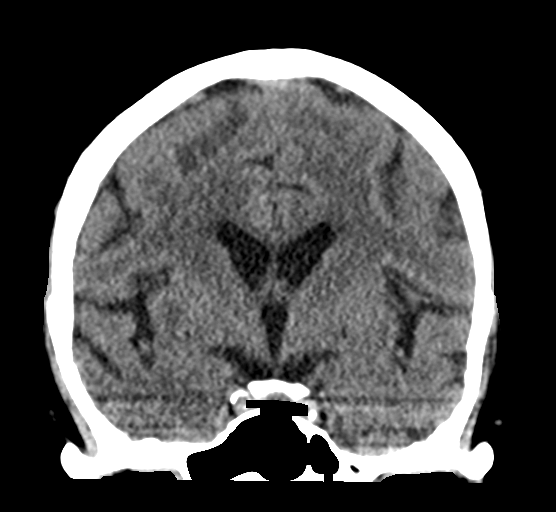

[Series 5: sagittal soft tissue · sagittal · 0.29mm/px · 3 of 54 slices shown]
[im 18/54  brain]
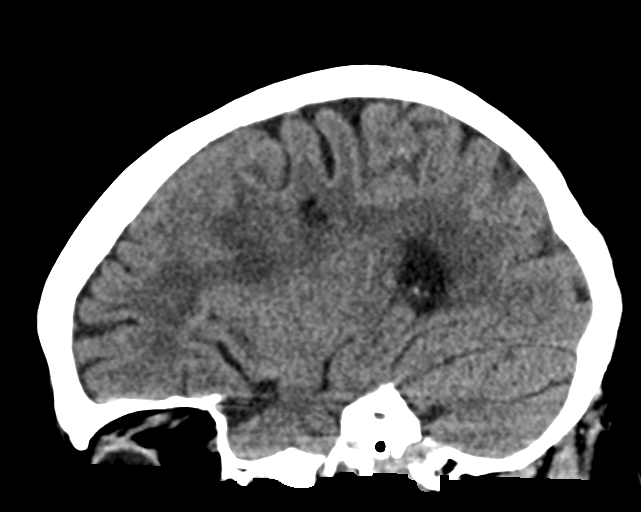
[im 27/54  brain]
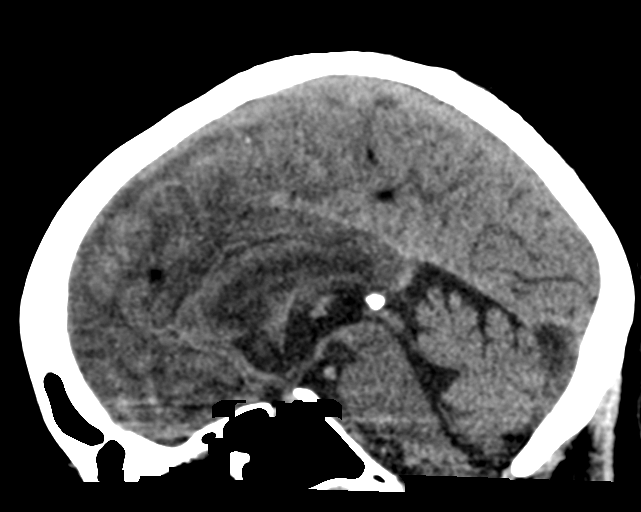
[im 36/54  brain]
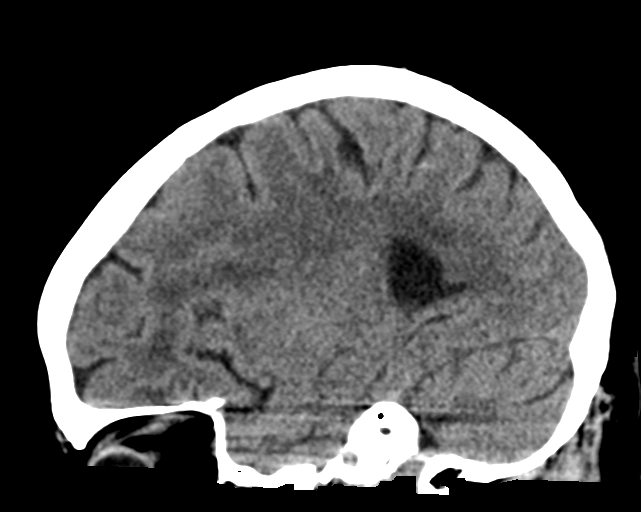

[16 of 47 positions shown; findings below may reference images not displayed]

FINDINGS: CT HEAD FINDINGS

Brain: No evidence of acute infarction, hemorrhage, hydrocephalus,
extra-axial collection or mass lesion/mass effect. Patchy white
matter hypodensities, nonspecific but compatible with chronic
microvascular ischemic disease. Similar remote infarct in the right
corona radiata.

Vascular: No hyperdense vessel identified.

Skull: No acute fracture.

Sinuses/Orbits: Clear sinuses.  No acute orbital findings.

Other: No mastoid effusions.

CT CERVICAL SPINE FINDINGS

Alignment: Straightening.  No substantial sagittal subluxation.

Skull base and vertebrae: Vertebral body heights are maintained. No
evidence of acute fracture.

Soft tissues and spinal canal: No prevertebral fluid or swelling. No
visible canal hematoma.

Disc levels: Degenerative disease is greatest along the posterior
disc space at C5-C6.

Upper chest: Emphysema in the partially imaged lung apices.

Other: Approximately 1.8 cm right thyroid nodule.
IMPRESSION: 1. No evidence of acute intracranial abnormality.
2. No evidence of acute fracture or traumatic malalignment in the
cervical spine.
3. Approximately 1.8 cm right thyroid nodule. Recommend thyroid
ultrasound (ref: [HOSPITAL]. [DATE]): 143-50).

## 2021-08-10 IMAGING — CT CT CERVICAL SPINE W/O CM
3 of 4 series · 11 of 33 positions shown, 13 images · non-contrast
Comparison: None Available.

CLINICAL DATA: Neck trauma (Age >= 65y) fall; fall



[Series 6: sagittal bone · sagittal · 0.25mm/px · 5 of 48 slices shown, 6 images]
[im 16/48  bone]
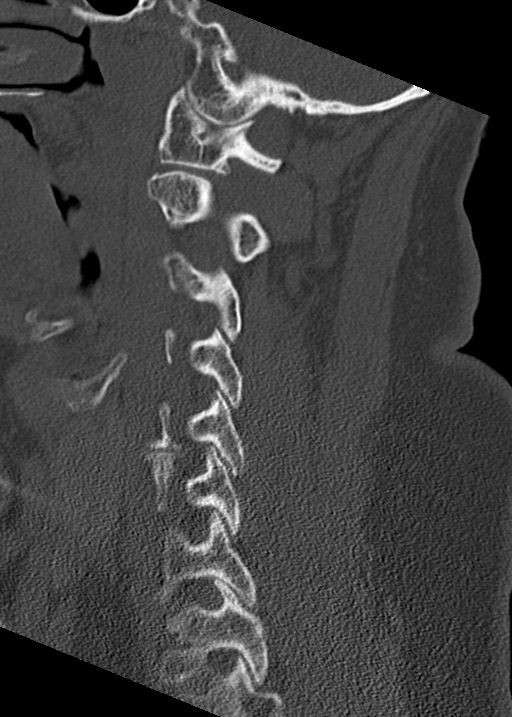
[im 20/48  bone]
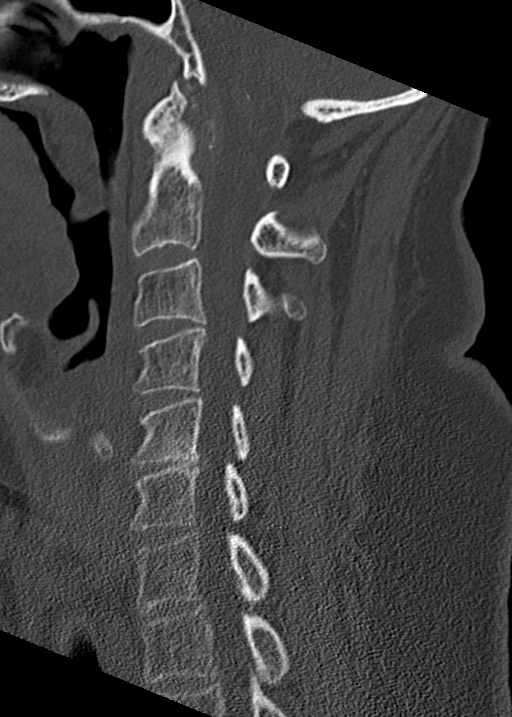
[im 24/48  soft-tissue]
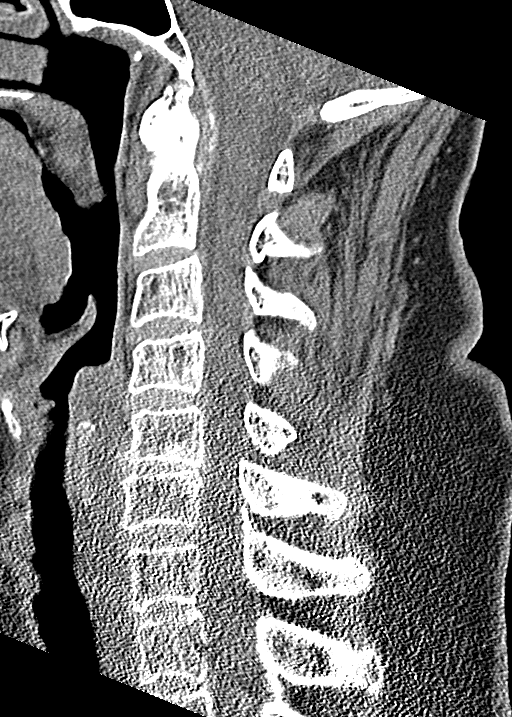
[im 24/48  bone]
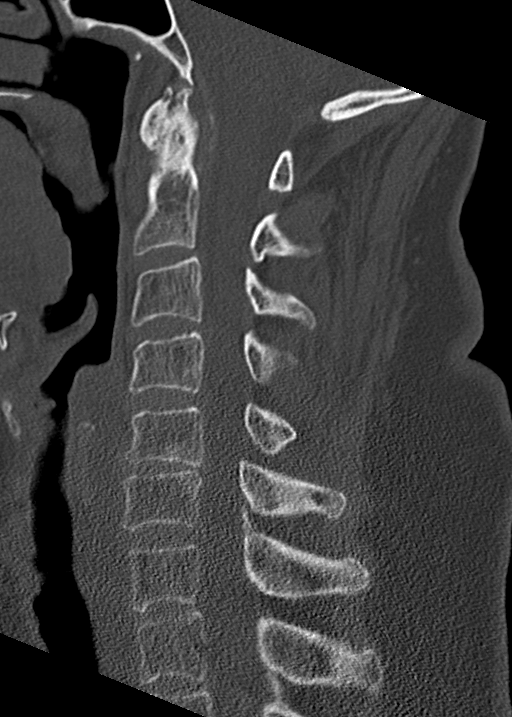
[im 28/48  bone]
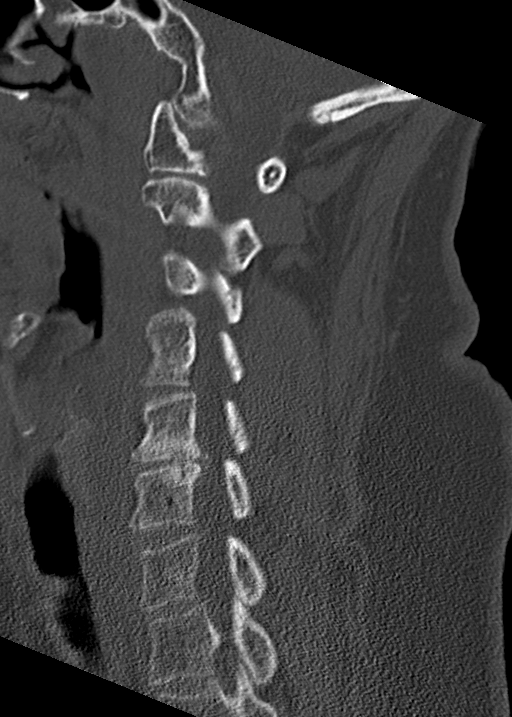
[im 32/48  bone]
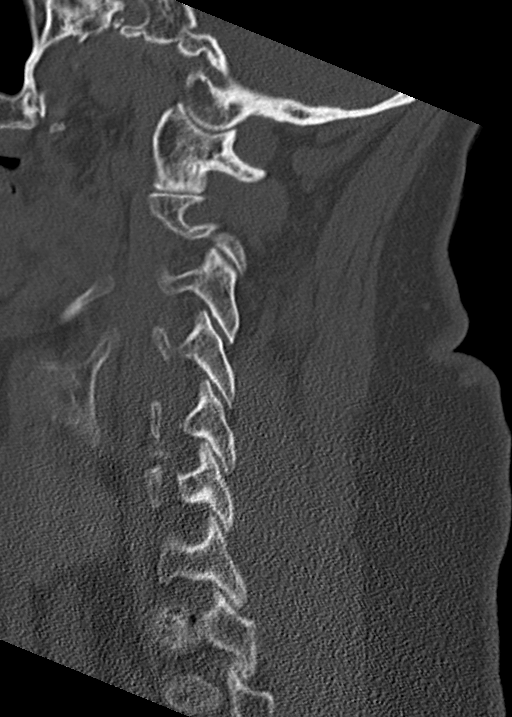

[Series 7: coronal bone · coronal · 0.19mm/px · 3 of 66 slices shown]
[im 14/66  bone]
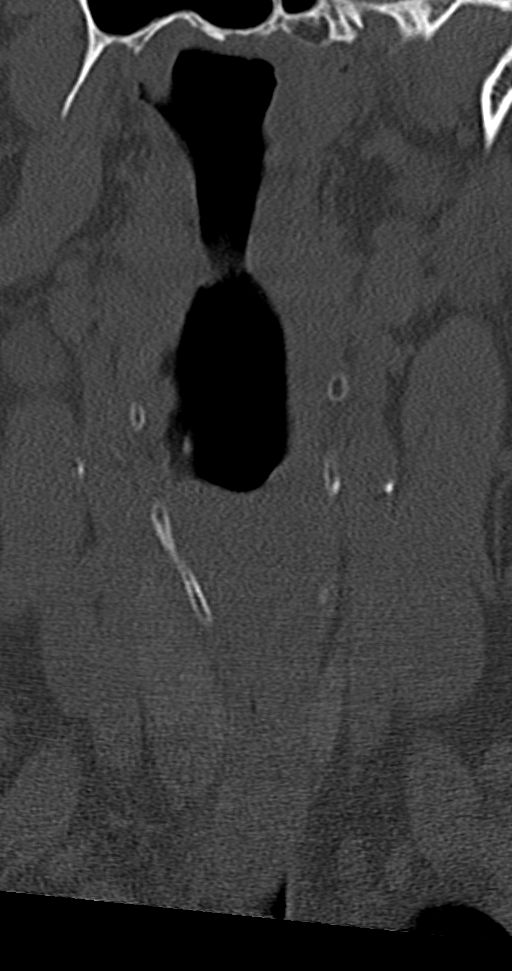
[im 27/66  bone]
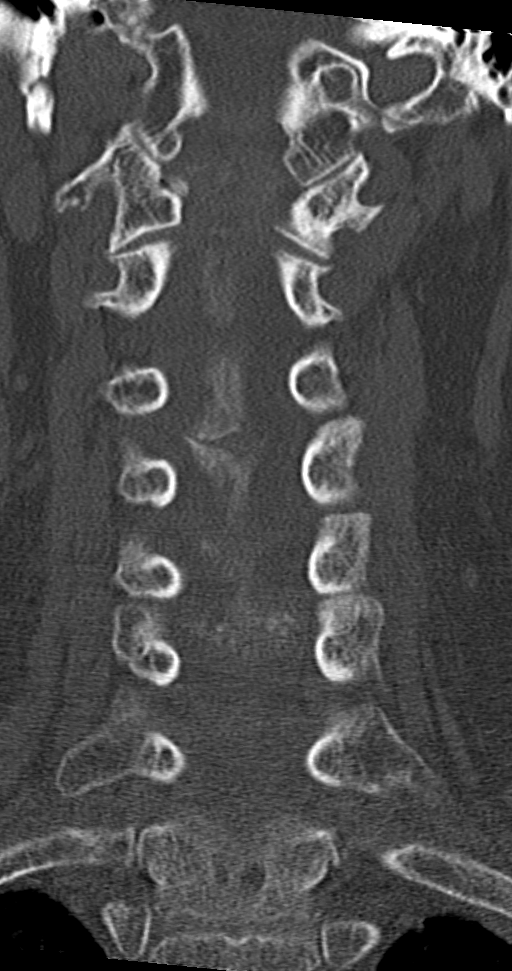
[im 40/66  bone]
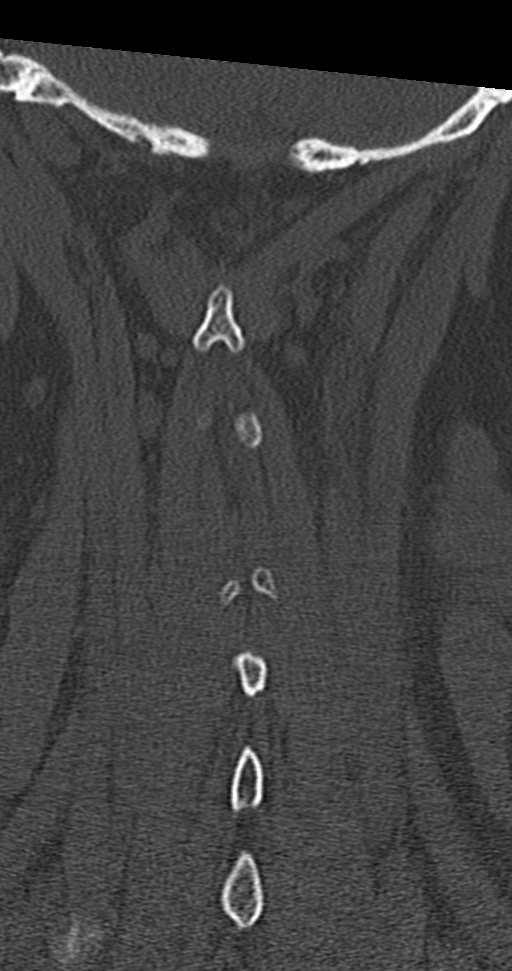

[Series 8: orthogonal bone · axial · 0.19mm/px · z∈[-306,-195]mm · 3 of 92 slices shown, 4 images]
[im 16/92  soft-tissue]
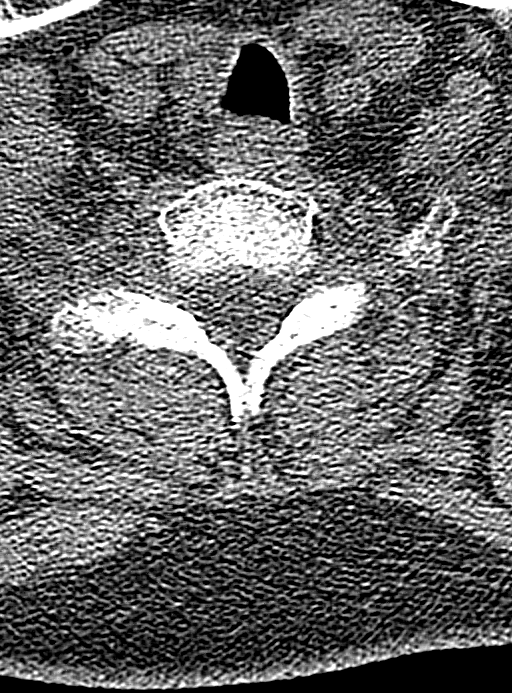
[im 16/92  bone]
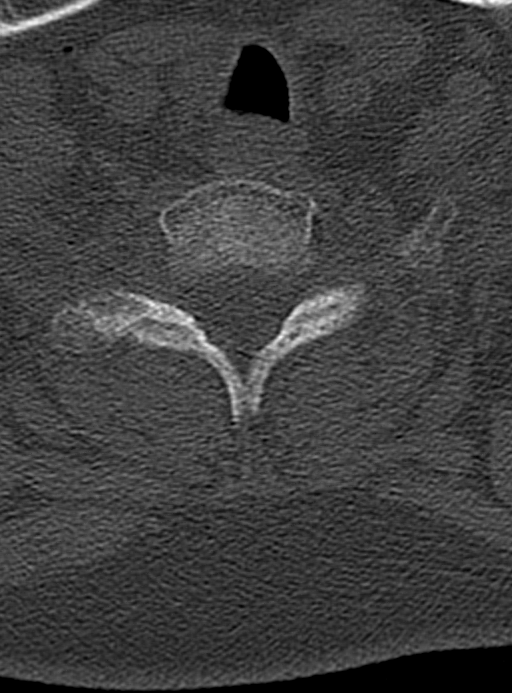
[im 46/92  bone]
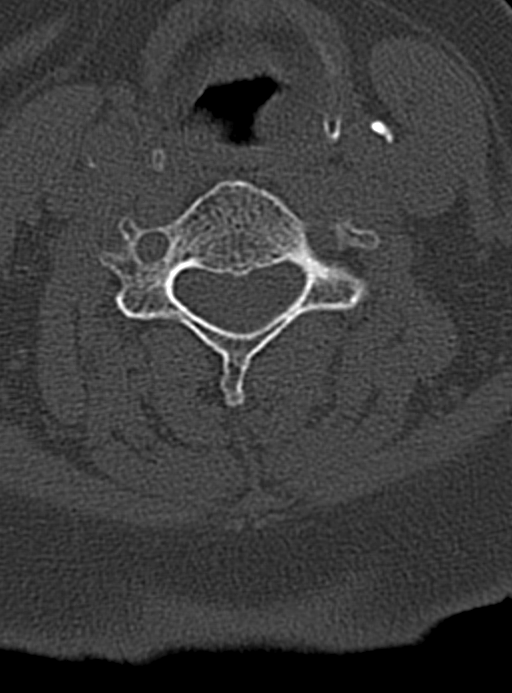
[im 76/92  bone]
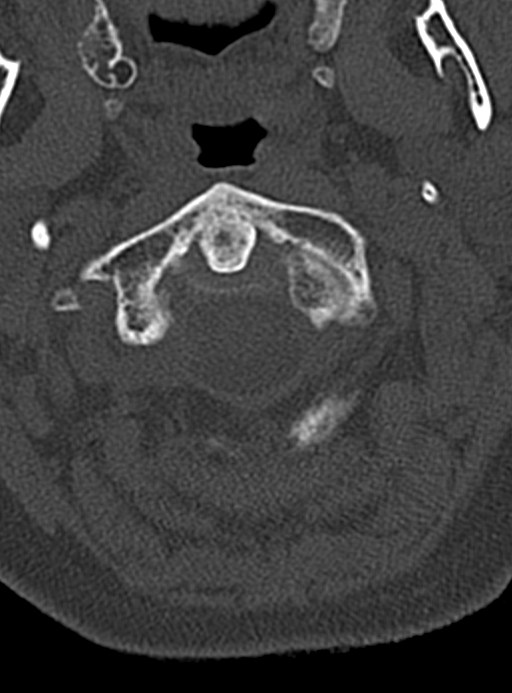

[11 of 33 positions shown; findings below may reference images not displayed]

FINDINGS: CT HEAD FINDINGS

Brain: No evidence of acute infarction, hemorrhage, hydrocephalus,
extra-axial collection or mass lesion/mass effect. Patchy white
matter hypodensities, nonspecific but compatible with chronic
microvascular ischemic disease. Similar remote infarct in the right
corona radiata.

Vascular: No hyperdense vessel identified.

Skull: No acute fracture.

Sinuses/Orbits: Clear sinuses.  No acute orbital findings.

Other: No mastoid effusions.

CT CERVICAL SPINE FINDINGS

Alignment: Straightening.  No substantial sagittal subluxation.

Skull base and vertebrae: Vertebral body heights are maintained. No
evidence of acute fracture.

Soft tissues and spinal canal: No prevertebral fluid or swelling. No
visible canal hematoma.

Disc levels: Degenerative disease is greatest along the posterior
disc space at C5-C6.

Upper chest: Emphysema in the partially imaged lung apices.

Other: Approximately 1.8 cm right thyroid nodule.
IMPRESSION: 1. No evidence of acute intracranial abnormality.
2. No evidence of acute fracture or traumatic malalignment in the
cervical spine.
3. Approximately 1.8 cm right thyroid nodule. Recommend thyroid
ultrasound (ref: [HOSPITAL]. [DATE]): 143-50).

## 2021-08-10 MED ORDER — ACETAMINOPHEN 325 MG PO TABS
650.0000 mg | ORAL_TABLET | Freq: Four times a day (QID) | ORAL | Status: DC | PRN
Start: 2021-08-10 — End: 2021-08-16

## 2021-08-10 MED ORDER — ROPINIROLE HCL 0.25 MG PO TABS
0.2500 mg | ORAL_TABLET | Freq: Every day | ORAL | Status: DC
  Administered 2021-08-11 – 2021-08-15 (×5): 0.25 mg via ORAL
  Filled 2021-08-10 (×6): qty 1

## 2021-08-10 MED ORDER — THIAMINE HCL 100 MG PO TABS
100.0000 mg | ORAL_TABLET | Freq: Every day | ORAL | Status: DC
  Administered 2021-08-11 – 2021-08-16 (×6): 100 mg via ORAL
  Filled 2021-08-10 (×6): qty 1

## 2021-08-10 MED ORDER — INSULIN GLARGINE-YFGN 100 UNIT/ML ~~LOC~~ SOLN
40.0000 [IU] | Freq: Every morning | SUBCUTANEOUS | Status: DC
  Administered 2021-08-12: 40 [IU] via SUBCUTANEOUS
  Filled 2021-08-10 (×3): qty 0.4

## 2021-08-10 MED ORDER — LACTATED RINGERS IV BOLUS
1000.0000 mL | Freq: Once | INTRAVENOUS | Status: AC
  Administered 2021-08-10: 1000 mL via INTRAVENOUS

## 2021-08-10 MED ORDER — LORATADINE 10 MG PO TABS
10.0000 mg | ORAL_TABLET | Freq: Every day | ORAL | Status: DC
  Administered 2021-08-11 – 2021-08-16 (×6): 10 mg via ORAL
  Filled 2021-08-10 (×6): qty 1

## 2021-08-10 MED ORDER — TRAZODONE HCL 50 MG PO TABS
25.0000 mg | ORAL_TABLET | Freq: Every evening | ORAL | Status: DC | PRN
  Administered 2021-08-13 – 2021-08-15 (×2): 25 mg via ORAL
  Filled 2021-08-10 (×2): qty 1

## 2021-08-10 MED ORDER — INSULIN ASPART 100 UNIT/ML IJ SOLN: 0.0000 [IU] | Freq: Three times a day (TID) | INTRAMUSCULAR | Status: DC

## 2021-08-10 MED ORDER — DIVALPROEX SODIUM 500 MG PO DR TAB
500.0000 mg | DELAYED_RELEASE_TABLET | Freq: Every day | ORAL | Status: DC
  Administered 2021-08-10 – 2021-08-15 (×6): 500 mg via ORAL
  Filled 2021-08-10 (×7): qty 1

## 2021-08-10 MED ORDER — MELATONIN 10 MG PO CAPS: 10.0000 mg | ORAL_CAPSULE | Freq: Every evening | ORAL | Status: DC | PRN

## 2021-08-10 MED ORDER — GLIMEPIRIDE 1 MG PO TABS
1.0000 mg | ORAL_TABLET | Freq: Every day | ORAL | Status: DC
  Administered 2021-08-11 – 2021-08-13 (×3): 1 mg via ORAL
  Filled 2021-08-10 (×4): qty 1

## 2021-08-10 MED ORDER — ALBUTEROL SULFATE (2.5 MG/3ML) 0.083% IN NEBU: 3.0000 mL | INHALATION_SOLUTION | Freq: Four times a day (QID) | RESPIRATORY_TRACT | Status: DC | PRN

## 2021-08-10 MED ORDER — LORAZEPAM 0.5 MG PO TABS
0.5000 mg | ORAL_TABLET | Freq: Four times a day (QID) | ORAL | Status: DC | PRN
  Administered 2021-08-10: 0.5 mg via ORAL
  Filled 2021-08-10: qty 1

## 2021-08-10 MED ORDER — ALENDRONATE SODIUM 70 MG PO TABS: 70.0000 mg | ORAL_TABLET | ORAL | Status: DC

## 2021-08-10 MED ORDER — AMLODIPINE BESYLATE 5 MG PO TABS
10.0000 mg | ORAL_TABLET | Freq: Every day | ORAL | Status: DC
  Administered 2021-08-11 – 2021-08-16 (×6): 10 mg via ORAL
  Filled 2021-08-10 (×6): qty 2

## 2021-08-10 MED ORDER — FAMOTIDINE 20 MG PO TABS
20.0000 mg | ORAL_TABLET | Freq: Two times a day (BID) | ORAL | Status: DC
  Administered 2021-08-10 – 2021-08-16 (×12): 20 mg via ORAL
  Filled 2021-08-10 (×12): qty 1

## 2021-08-10 MED ORDER — LOSARTAN POTASSIUM 50 MG PO TABS
50.0000 mg | ORAL_TABLET | Freq: Two times a day (BID) | ORAL | Status: DC
  Administered 2021-08-10 – 2021-08-16 (×12): 50 mg via ORAL
  Filled 2021-08-10 (×12): qty 1

## 2021-08-10 MED ORDER — INSULIN GLARGINE-YFGN 100 UNIT/ML ~~LOC~~ SOLN
20.0000 [IU] | Freq: Every day | SUBCUTANEOUS | Status: DC
  Administered 2021-08-10 – 2021-08-15 (×6): 20 [IU] via SUBCUTANEOUS
  Filled 2021-08-10 (×7): qty 0.2

## 2021-08-10 MED ORDER — DIVALPROEX SODIUM 250 MG PO DR TAB
250.0000 mg | DELAYED_RELEASE_TABLET | ORAL | Status: DC
  Administered 2021-08-11 – 2021-08-16 (×6): 250 mg via ORAL
  Filled 2021-08-10 (×6): qty 1

## 2021-08-10 MED ORDER — ATORVASTATIN CALCIUM 20 MG PO TABS
80.0000 mg | ORAL_TABLET | Freq: Every day | ORAL | Status: DC
  Administered 2021-08-11 – 2021-08-16 (×6): 80 mg via ORAL
  Filled 2021-08-10 (×6): qty 4

## 2021-08-10 MED ORDER — MELATONIN 5 MG PO TABS
10.0000 mg | ORAL_TABLET | Freq: Every evening | ORAL | Status: DC | PRN
Start: 2021-08-10 — End: 2021-08-16

## 2021-08-10 NOTE — ED Notes (Signed)
Pt changed into a hospital gown, brief changed, and socks and Pj pants placed in a belongings bag - purewick in place.

## 2021-08-10 NOTE — TOC Initial Note (Signed)
Transition of Care [facility]) - Initial/Assessment Note    Patient Details  Name: Cheyenne [name] MRN: [id number] Date of Birth: [birth year]  [facility] of [facility]) CM/SW Contact:    Shelbie Hutching, RN Phone Number: 08/10/2021, 2:57 PM  Clinical Narrative:                 Patient brought into the emergency room after falling at home.  Patient lives with her granddaughter, granddaughter reports that the patient has had 4 strokes this past year.  Patient also has dementia.  Granddaughter does not feel that the patient is safe at home any longer, she is having trouble taking care of her, she would like the patient placed in LTC.  Patient does not currently have Medicaid but the granddaughter staid she will qualify for the LTC Medicaid but she does not qualify for community Medicaid.  Granddaughter reports that patient did very well when she was at [facility] and it was never her intention to have to put patient in a nursing home but she realized when the patient came home that she could not take care of her like she needed to be.   PT to evaluate patient.  WE will see if patient can be approved for short term rehab then transition to LTC- she was walking at home.    Expected Discharge Plan: Skilled Nursing Facility Barriers to Discharge: SNF Pending bed offer   Patient Goals and CMS Choice Patient states their goals for this hospitalization and ongoing recovery are:: Granddaughter would like patient placed in LTC- she cannot take care of her at home- her dementia has become too advanced CMS Medicare.gov Compare Post Acute Care list provided to:: Patient Represenative (must comment) Choice offered to / list presented to : Adult Children Loss adjuster, chartered)  Expected Discharge Plan and Services Expected Discharge Plan: [place]   Discharge Planning Services: CM Consult Post Acute Care Choice: [place] Living arrangements for the past 2 months: Mobile Home                  DME Arranged: N/A         HH Arranged: NA [place] Agency: NA        Prior Living Arrangements/Services Living arrangements for the past 2 months: Mobile Home Lives with:: Relatives Patient language and need for interpreter reviewed:: Yes Do you feel safe going back to the place where you live?: No   patient fell last night- granddaughter does not feel patient is safe to be at home any longer  Need for Family Participation in Patient Care: Yes (Comment) Care giver support system in place?: Yes (comment) Current home services: DME (walker) Criminal Activity/Legal Involvement Pertinent to Current Situation/Hospitalization: No - Comment as needed  Activities of Daily Living      Permission Sought/Granted Permission sought to share information with : Case Manager, Customer service manager, Family Supports Permission granted to share information with : Yes, Verbal Permission Granted  Share Information with NAME: Lanna Poche  Permission granted to share info w AGENCY: SNF's  Permission granted to share info w Relationship: granddaughter  Permission granted to share info w Contact Information: [phone]  Emotional Assessment Appearance:: Appears older than stated age Attitude/Demeanor/Rapport: Engaged Affect (typically observed): Accepting Orientation: : Oriented to Self, Oriented to Place Alcohol / Substance Use: Not Applicable Psych Involvement: No (comment)  Admission diagnosis:  Fall Patient Active Problem List   Diagnosis Date Noted   Acute metabolic encephalopathy 10/16/4816  Hypertensive urgency 06/21/2021   Diabetes mellitus without complication (HCC)    Hemiparesis affecting left side as late effect of cerebrovascular accident (CVA) ([place])    Aspiration pneumonia ([place])    Urinary tract infection 02/02/2021   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 ([place]) 01/24/2021   Protein-calorie malnutrition, severe  11/18/2020   Colitis on CT 11/10/2020   Abdominal pain 11/10/2020   Nausea & vomiting 11/10/2020   AKI (acute kidney injury) ([place]) 11/10/2020   Hyperglycemia due to diabetes mellitus ([place]) 11/10/2020   Leukocytosis 11/10/2020   Mixed hyperlipidemia 11/10/2020   Tobacco use 11/10/2020   Asthma 11/10/2020   SIRS (systemic inflammatory response syndrome) ([place]) 11/10/2020   Restless leg syndrome 11/10/2020   Vitamin D deficiency 11/10/2020   Enteritis 11/10/2020   Acute CVA (cerebrovascular accident) ([place])    Generalized weakness 10/25/2020   Essential hypertension 10/25/2020   Uncontrolled type 2 diabetes mellitus with hyperglycemia, with long-term current use of insulin ([place]) 10/25/2020   Psychiatric problem 10/25/2020   PCP:  Toni Arthurs, PA Pharmacy:   [place], [place] [address] [place] [address] Phone: [phone] Fax: [place], [place] [address] [place] [address] Phone: [phone] Fax: [phone]     Social Determinants of Health (SDOH) Interventions    Readmission Risk Interventions    02/03/2021   11:22 AM 01/25/2021    4:22 PM 11/14/2020    4:33 PM  Readmission Risk Prevention Plan  Medication Screening  Complete   Transportation Screening Complete Complete   Home Care Screening     Medication Review (RN CM)     [place] or Home Care Consult Complete    Social Work Consult for [place] Planning/Counseling Complete    Palliative Care Screening Not Applicable    Medication Review (RN Care Manager) Complete       Information is confidential and restricted. Go to Review Flowsheets to unlock data.

## 2021-08-10 NOTE — ED Provider Notes (Signed)
-----------------------------------------   10:04 AM on 08/10/2021 -----------------------------------------  I took over care of this patient from Dr. Joni Fears.  CT head and cervical spine are negative.  I independently viewed and and interpreted the images and there is no evidence of ICH or acute fracture.  Radiology report indicates the following:  IMPRESSION:  1. No evidence of acute intracranial abnormality.  2. No evidence of acute fracture or traumatic malalignment in the  cervical spine.  3. Approximately 1.8 cm right thyroid nodule. Recommend thyroid  ultrasound (ref: J Am Coll Radiol. 2015 Feb;12(2): 143-50).      Electronically Signed    By: Margaretha Sheffield M.D.    On: 08/10/2021 08:22   Urinalysis is negative for acute findings to suggest UTI.  On reassessment, the patient appears comfortable.  She states she would like to go home.  Her vital signs have been stable.  She is appropriate for discharge at this time.  I counseled her on the results of the work-up.  Return precautions given, and she expresses understanding.   Arta Silence, MD 08/10/21 1005

## 2021-08-10 NOTE — ED Triage Notes (Signed)
Pt had an unwitnessed fall today at home. Pt has hx of 4 strokes prior. Pt disoriented to place, and situation. EMS states pt was found down for an unknown amount of time.

## 2021-08-10 NOTE — ED Notes (Signed)
Patient moving in bed.  She has her gown off.
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
## 2021-08-10 NOTE — Discharge Instructions (Addendum)
The scan of your neck shows a small thyroid nodule.  It is recommended that you follow-up with your regular doctor to schedule an ultrasound for further evaluation of this.

## 2021-08-10 NOTE — Evaluation (Signed)
Physical Therapy Evaluation Patient Details Name: Cheyenne [name] MRN: [id number] DOB: 12/22/1950 Today's Date: 08/10/2021  History of Present Illness  Cheyenne [name] is a 71 y.o. female with a history of hypertension, diabetes, dementia, prior stroke who was sent to the ED from home due to unwitnessed fall.  She was unable to get up off the floor alone.  Unknown downtime.   Clinical Impression  Patient received in room sleeping. She is groggy during session but agreeable. She needs min guard for bed mobility and transfers. Ambulated 25 feet with RW and min guard. Patient oriented to self only. Decreased safety awareness and is at high risk of falls. Patient will continue to benefit from skilled PT to improve independence and safety.         Recommendations for follow up therapy are one component of a multi-disciplinary discharge planning process, led by the attending physician.  Recommendations may be updated based on patient status, additional functional criteria and insurance authorization.  Follow Up Recommendations Skilled nursing-short term rehab (<3 hours/day)    Assistance Recommended at Discharge Frequent or constant Supervision/Assistance  Patient can return home with the following  A little help with walking and/or transfers;A little help with bathing/dressing/bathroom;Assist for transportation;Assistance with cooking/housework;Help with stairs or ramp for entrance;Direct supervision/assist for medications management;Direct supervision/assist for financial management    Equipment Recommendations None recommended by PT  Recommendations for Other Services       Functional Status Assessment Patient has had a recent decline in their functional status and demonstrates the ability to make significant improvements in function in a reasonable and predictable amount of time.     Precautions / Restrictions Precautions Precautions: Fall Restrictions Weight Bearing Restrictions: No       Mobility  Bed Mobility Overal bed mobility: Needs Assistance Bed Mobility: Supine to Sit, Sit to Supine     Supine to sit: Min guard Sit to supine: Modified independent (Device/Increase time)        Transfers Overall transfer level: Needs assistance Equipment used: Rolling walker (2 wheels) Transfers: Sit to/from Stand Sit to Stand: Min guard                Ambulation/Gait Ambulation/Gait assistance: Min guard Gait Distance (Feet): 25 Feet Assistive device: Rolling walker (2 wheels) Gait Pattern/deviations: Decreased step length - right, Decreased step length - left, Step-through pattern, Trunk flexed, Decreased stride length Gait velocity: decreased     General Gait Details: patient generally steady with use of RW and min guard. No lob  Stairs            Wheelchair Mobility    Modified Rankin (Stroke Patients Only)       Balance Overall balance assessment: Needs assistance, History of Falls Sitting-balance support: Feet supported Sitting balance-Leahy Scale: Fair     Standing balance support: Bilateral upper extremity supported, During functional activity, Reliant on assistive device for balance Standing balance-Leahy Scale: Fair Standing balance comment: Reliant on RW and min guard                             Pertinent Vitals/Pain Pain Assessment Pain Assessment: No/denies pain Faces Pain Scale: Hurts a little bit Pain Location: back Pain Descriptors / Indicators: Discomfort, Sore    Home Living Family/patient expects to be discharged to:: Private residence Living Arrangements: Other relatives Available Help at Discharge: Family;Available 24 hours/day Type of Home: House Home Access: Stairs to enter Entrance Stairs-Rails: Right  Entrance Stairs-Number of Steps: 4   Home Layout: One level Home Equipment: Conservation officer, nature (2 wheels);Cane - single point;Shower seat Additional Comments: information from previous admission,  patient is poor historian, no family present    Prior Function Prior Level of Function : Needs assist;History of Falls (last six months)             Mobility Comments: Independent/Modified Independent household ambulator, does not drive due to poor vision, uses RW occasionally. ADLs Comments: Assisted by family and home nurse as needed.     Hand Dominance        Extremity/Trunk Assessment   Upper Extremity Assessment Upper Extremity Assessment: Generalized weakness    Lower Extremity Assessment Lower Extremity Assessment: Generalized weakness    Cervical / Trunk Assessment Cervical / Trunk Assessment: Normal  Communication   Communication: No difficulties  Cognition Arousal/Alertness: Lethargic Behavior During Therapy: Flat affect Overall Cognitive Status: No family/caregiver present to determine baseline cognitive functioning                                          General Comments      Exercises     Assessment/Plan    PT Assessment Patient needs continued PT services  PT Problem List Decreased strength;Decreased mobility;Decreased safety awareness;Decreased activity tolerance;Decreased balance;Decreased cognition       PT Treatment Interventions DME instruction;Therapeutic exercise;Gait training;Stair training;Functional mobility training;Therapeutic activities;Patient/family education    PT Goals (Current goals can be found in the Care Plan section)  Acute Rehab PT Goals Patient Stated Goal: none stated PT Goal Formulation: Patient unable to participate in goal setting Time For Goal Achievement: 08/24/21    Frequency Min 2X/week     Co-evaluation               AM-PAC PT "6 Clicks" Mobility  Outcome Measure Help needed turning from your back to your side while in a flat bed without using bedrails?: A Little Help needed moving from lying on your back to sitting on the side of a flat bed without using bedrails?: A Little Help  needed moving to and from a bed to a chair (including a wheelchair)?: A Little Help needed standing up from a chair using your arms (e.g., wheelchair or bedside chair)?: A Little Help needed to walk in hospital room?: A Little Help needed climbing 3-5 steps with a railing? : A Lot 6 Click Score: 17    End of Session Equipment Utilized During Treatment: Gait belt Activity Tolerance: Patient limited by fatigue Patient left: in bed;with call bell/phone within reach;with bed alarm set Nurse Communication: Mobility status PT Visit Diagnosis: Muscle weakness (generalized) (M62.81);Difficulty in walking, not elsewhere classified (R26.2);History of falling (Z91.81);Unsteadiness on feet (R26.81)    Time: 1517-6160 PT Time Calculation (min) (ACUTE ONLY): 9 min   Charges:   PT Evaluation $PT Eval Moderate Complexity: 1 Mod          Azadeh Hyder, PT, GCS 08/10/21,3:12 PM

## 2021-08-10 NOTE — Progress Notes (Signed)

## 2021-08-10 NOTE — ED Notes (Signed)
Pt's granddaughter, Lanna Poche called, granddaughter states that she will work on arranging transportation for pt but that might take an hour. She will call ED when she has arranged transport.

## 2021-08-10 NOTE — ED Provider Notes (Signed)
[facility] Provider Note    Event Date/Time   First MD Initiated Contact with Patient 08/10/21 [phone]     (approximate)   History   Chief Complaint: Fall   HPI  Cheyenne [name] is a 71 y.o. female with a history of hypertension, diabetes, dementia, prior stroke who was sent to the ED from home due to unwitnessed fall.  She was unable to get up off the floor alone.  Unknown downtime.  Patient denies any acute symptoms.  Denies pain or shortness of breath     Physical Exam   Triage Vital Signs: ED Triage Vitals  Enc Vitals Group     BP 08/10/21 0613 (!) 148/67     Pulse Rate 08/10/21 0613 78     Resp 08/10/21 0613 17     Temp 08/10/21 0613 97.8 F (36.6 C)     Temp Source 08/10/21 0613 Oral     SpO2 08/10/21 0613 99 %     Weight 08/10/21 0636 141 lb 1.5 oz (64 kg)     Height 08/10/21 0636 '5\' 4"'$  (1.626 m)     Head Circumference --      Peak Flow --      Pain Score 08/10/21 0611 0     Pain Loc --      Pain Edu? --      Excl. in [place]? --     Most recent vital signs: Vitals:   08/10/21 0613  BP: (!) 148/67  Pulse: 78  Resp: 17  Temp: 97.8 F (36.6 C)  SpO2: 99%    General: Awake, no distress.  CV:  Good peripheral perfusion.  Regular rate and rhythm.  Loud systolic murmur Resp:  Normal effort.  Clear to auscultation bilaterally. Abd:  No distention.  Soft and nontender Other:  Full range of motion all extremities, Long bones stable, no bony point tenderness.  Dry mucous membranes.   ED Results / Procedures / Treatments   Labs (all labs ordered are listed, but only abnormal results are displayed) Labs Reviewed  COMPREHENSIVE METABOLIC PANEL - Abnormal; Notable for the following components:      Result Value   Glucose, Bld 216 (*)    Calcium 8.7 (*)    All other components within normal limits  CBC WITH DIFFERENTIAL/PLATELET  CK  URINALYSIS, ROUTINE W REFLEX MICROSCOPIC     EKG  Interpreted by me Sinus rhythm rate of 82.   Normal axis and intervals.  Progression.  Normal ST segments and T waves.  RADIOLOGY CT head and cervical spine pending   PROCEDURES:  Procedures   MEDICATIONS ORDERED IN ED: Medications  lactated ringers bolus 1,000 mL (1,000 mLs Intravenous Bolus 08/10/21 0657)     IMPRESSION / MDM / ASSESSMENT AND PLAN / ED COURSE  I reviewed the triage vital signs and the nursing notes.                              Differential diagnosis includes, but is not limited to, intracranial hemorrhage, skull fracture, C-spine fracture, dehydration, renal insufficiency, electrolyte abnormality, UTI  Patient's presentation is most consistent with acute presentation with potential threat to life or bodily function.  Patient presents with a fall and generalized weakness.  Unable to provide any specific history.  Exam is reassuring.  She does clinically appear dehydrated, will give IV fluids.  Will check CT head and neck to evaluate for serious traumatic  injury.  Serum labs are okay, so CT is reassuring patient can be discharged back home.       FINAL CLINICAL IMPRESSION(S) / ED DIAGNOSES   Final diagnoses:  Unwitnessed fall  Severe dementia, unspecified dementia type, unspecified whether behavioral, psychotic, or mood disturbance or anxiety ([place])  Type 2 diabetes mellitus without complication, with long-term current use of insulin ([place])     Rx / DC Orders   ED Discharge Orders     None        Note:  This document was prepared using Dragon voice recognition software and may include unintentional dictation errors.   Carrie Mew, MD 08/10/21 [phone]

## 2021-08-10 NOTE — NC FL2 (Signed)
[place] LEVEL OF CARE SCREENING TOOL     IDENTIFICATION  Patient Name: Cheyenne [name] Birthdate: 1950/06/11 Sex: female Admission Date (Current Location): 08/10/2021  [place] and [place] Number:  Engineering geologist and Address:  [facility], [address] [place], [place] [address]      Provider Number: [id number]  Attending Physician Name and Address:  No att. providers found  Relative Name and Phone Number:  Lanna Poche- grandddaughter- [phone]    Current Level of Care: Other (Comment) (ED boarder) Recommended Level of Care: [place] Prior Approval Number:    Date Approved/Denied:   PASRR Number: [id number]  Discharge Plan: SNF    Current Diagnoses: Patient Active Problem List   Diagnosis Date Noted   Acute metabolic encephalopathy 28/36/6294   Hypertensive urgency 06/21/2021   Diabetes mellitus without complication (HCC)    Hemiparesis affecting left side as late effect of cerebrovascular accident (CVA) ([place])    Aspiration pneumonia ([place])    Urinary tract infection 02/02/2021   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 ([place]) 01/24/2021   Protein-calorie malnutrition, severe 11/18/2020   Colitis on CT 11/10/2020   Abdominal pain 11/10/2020   Nausea & vomiting 11/10/2020   AKI (acute kidney injury) ([place]) 11/10/2020   Hyperglycemia due to diabetes mellitus ([place]) 11/10/2020   Leukocytosis 11/10/2020   Mixed hyperlipidemia 11/10/2020   Tobacco use 11/10/2020   Asthma 11/10/2020   SIRS (systemic inflammatory response syndrome) ([place]) 11/10/2020   Restless leg syndrome 11/10/2020   Vitamin D deficiency 11/10/2020   Enteritis 11/10/2020   Acute CVA (cerebrovascular accident) [facility])    Generalized weakness 10/25/2020   Essential hypertension 10/25/2020   Uncontrolled type 2 diabetes mellitus with hyperglycemia, with long-term current use of insulin ([place])  10/25/2020   Psychiatric problem 10/25/2020    Orientation RESPIRATION BLADDER Height & Weight     Self, Place  Normal Incontinent Weight: 64 kg Height:  '5\' 4"'$  (162.6 cm)  BEHAVIORAL SYMPTOMS/MOOD NEUROLOGICAL BOWEL NUTRITION STATUS      Incontinent Diet (Regular)  AMBULATORY STATUS COMMUNICATION OF NEEDS Skin   Limited Assist Verbally Normal                       Personal Care Assistance Level of Assistance  Bathing, Feeding, Dressing Bathing Assistance: Maximum assistance Feeding assistance: Limited assistance Dressing Assistance: Maximum assistance     Functional Limitations Info  Sight, Hearing, Speech Sight Info: Adequate Hearing Info: Adequate Speech Info: Adequate    SPECIAL CARE FACTORS FREQUENCY  PT (By licensed PT), OT (By licensed OT)     PT Frequency: 5 times per week OT Frequency: 5 times per week            Contractures Contractures Info: Not present    Additional Factors Info  Code Status, Allergies Code Status Info: Full Code Allergies Info: Clonazepam, gabapentin, pregabalin, codeine           Current Medications (08/10/2021):  This is the current hospital active medication list No current facility-administered medications for this encounter.   Current Outpatient Medications  Medication Sig Dispense Refill   acetaminophen (TYLENOL) 325 MG tablet Take 650 mg by mouth every 6 (six) hours as needed for mild pain.     albuterol (VENTOLIN HFA) 108 (90 Base) MCG/ACT inhaler Inhale 1-2 puffs into the lungs every 6 (six) hours as needed for wheezing or shortness of breath. 8 g 0   alendronate (  FOSAMAX) 70 MG tablet Take 70 mg by mouth once a week. Wednesday     amLODipine (NORVASC) 10 MG tablet Take 1 tablet (10 mg total) by mouth daily. (Patient not taking: Reported on 06/21/2021) 30 tablet 1   atorvastatin (LIPITOR) 80 MG tablet Take 80 mg by mouth daily.     Cholecalciferol 25 MCG (1000 UT) tablet Take 1,000 Units by mouth daily.      divalproex (DEPAKOTE) 250 MG DR tablet Take 250 mg by mouth every morning.     divalproex (DEPAKOTE) 500 MG DR tablet Take 500 mg by mouth at bedtime.     famotidine (PEPCID) 20 MG tablet Take 20 mg by mouth 2 (two) times daily.     glimepiride (AMARYL) 1 MG tablet Take 1 mg by mouth daily with breakfast.  5   insulin aspart (NOVOLOG) 100 UNIT/ML injection Inject 0-15 Units into the skin 3 (three) times daily with meals. 10 mL 11   insulin glargine (LANTUS) 100 UNIT/ML injection Inject 20-40 Units into the skin 2 (two) times daily in the am and at bedtime.. Inject 40 units into the skin in the morning and 20 units into the skin at bedtime.     loperamide (IMODIUM) 2 MG capsule Take 1 capsule (2 mg total) by mouth 3 (three) times daily as needed for diarrhea or loose stools. 30 capsule 0   loratadine (CLARITIN) 10 MG tablet Take 10 mg by mouth daily.     losartan (COZAAR) 50 MG tablet Take 50 mg by mouth 2 (two) times daily.     Melatonin 10 MG CAPS Take 10 mg by mouth at bedtime as needed.     Multiple Vitamin (MULTIVITAMIN WITH MINERALS) TABS tablet Take 1 tablet by mouth daily.     Nutritional Supplements (,FEEDING SUPPLEMENT, PROSOURCE PLUS) liquid Take 30 mLs by mouth 3 (three) times daily between meals.     OLANZapine zydis (ZYPREXA) 5 MG disintegrating tablet Place 2.5 mg under the tongue at bedtime.     rOPINIRole (REQUIP) 0.25 MG tablet Take 0.25 mg by mouth at bedtime.     thiamine 100 MG tablet Take 100 mg by mouth daily.       Discharge Medications: Please see discharge summary for a list of discharge medications.  Relevant Imaging Results:  Relevant Lab Results:   Additional Information SS#: [id number]  Shelbie Hutching, RN

## 2021-08-11 LAB — CBG MONITORING, ED
Glucose-Capillary: 142 mg/dL — ABNORMAL HIGH (ref 70–99)
Glucose-Capillary: 96 mg/dL (ref 70–99)
Glucose-Capillary: 236 mg/dL — ABNORMAL HIGH (ref 70–99)

## 2021-08-11 MED ORDER — INSULIN ASPART 100 UNIT/ML IJ SOLN
0.0000 [IU] | Freq: Three times a day (TID) | INTRAMUSCULAR | Status: DC
  Administered 2021-08-11: 2 [IU] via SUBCUTANEOUS
  Administered 2021-08-11: 5 [IU] via SUBCUTANEOUS
  Administered 2021-08-12 (×2): 2 [IU] via SUBCUTANEOUS
  Filled 2021-08-11 (×4): qty 1

## 2021-08-11 NOTE — ED Notes (Signed)
Pt lunch tray placed on bedside table for when pt wakes up

## 2021-08-11 NOTE — ED Notes (Signed)
Pt in bed. Pt has meal tray at bed. Pt resting.

## 2021-08-11 NOTE — TOC Progression Note (Signed)
Transition of Care [facility]) - Progression Note    Patient Details  Name: CHENELL LOZON MRN: [id number] Date of Birth: 08-13-1950  [facility] CM/SW Contact  Shelbie Hutching, RN Phone Number: 08/11/2021, 5:45 PM  Clinical Narrative:    Called and left a message for the patient's granddaughter to return call.  Patient has one bed offer for [facility].     Expected Discharge Plan: Skilled Nursing Facility Barriers to Discharge: SNF Pending bed offer  Expected Discharge Plan and Services Expected Discharge Plan: [place]   Discharge Planning Services: CM Consult Post Acute Care Choice: [place] Living arrangements for the past 2 months: Mobile Home                 DME Arranged: N/A         HH Arranged: NA HH Agency: NA         Social Determinants of Health (SDOH) Interventions    Readmission Risk Interventions    02/03/2021   11:22 AM 01/25/2021    4:22 PM 11/14/2020    4:33 PM  Readmission Risk Prevention Plan  Medication Screening  Complete   Transportation Screening Complete Complete   Home Care Screening     Medication Review (RN CM)     [place] or Home Care Consult Complete    Social Work Consult for [place] Planning/Counseling Complete    Palliative Care Screening Not Applicable    Medication Review Press photographer) Complete       Information is confidential and restricted. Go to Review Flowsheets to unlock data.

## 2021-08-11 NOTE — ED Provider Notes (Signed)
Today's Vitals   08/10/21 1819 08/10/21 2135 08/11/21 0021 08/11/21 0217  BP: (!) 151/66 (!) 183/70 (!) 178/67   Pulse: 80 84 77   Resp: '20 18 17   '$ Temp:  98.2 F (36.8 C)    TempSrc:  Oral    SpO2: 100% 96% 97%   Weight:      Height:      PainSc:    Asleep   Body mass index is 24.22 kg/m.  No acute events overnight.  Awaiting social work disposition.   Laporche Martelle, Delice Bison, DO 08/11/21 [phone]

## 2021-08-11 NOTE — ED Notes (Signed)
Dinner tray at bedside. Pt given insulin prior to food/dinner

## 2021-08-12 LAB — CBG MONITORING, ED
Glucose-Capillary: 106 mg/dL — ABNORMAL HIGH (ref 70–99)
Glucose-Capillary: 133 mg/dL — ABNORMAL HIGH (ref 70–99)
Glucose-Capillary: 149 mg/dL — ABNORMAL HIGH (ref 70–99)

## 2021-08-12 NOTE — ED Notes (Signed)
Woke pt in order to straighten out L arm which BP cuff is attached to as pt's BP read low twice when this RN attempting to get accurate reading since pt's arm was bent while asleep; had previously adjusted cuff as well. Pt denied dizziness, nausea and pain before falling asleep again.

## 2021-08-12 NOTE — ED Notes (Signed)
Pt continues to sleep. Lights in room left on when lunch tray presented to pt in an effort to prompt her to wake and eat.

## 2021-08-12 NOTE — ED Notes (Signed)
Pt's dinner tray on bedside table. Pt aware of it.

## 2021-08-12 NOTE — ED Provider Notes (Signed)
Emergency Medicine Observation Re-evaluation Note  Cheyenne [name] is a 71 y.o. female, seen on rounds today.  Pt initially presented to the ED for complaints of Fall No acute events overnight.  Physical Exam  BP 108/63   Pulse 90   Temp 97.6 F (36.4 C) (Axillary)   Resp 16   Ht '5\' 4"'$  (1.626 m)   Wt 64 kg   SpO2 98%   BMI 24.22 kg/m   ED Course / MDM   No recent lab work for review besides a reassuring CBG of 149 today.  Plan  Current plan is for placement to an appropriate living facility once available.  [facility] is not under involuntary commitment.     Harvest Dark, MD 08/12/21 2321

## 2021-08-12 NOTE — ED Notes (Signed)
Pt has untouched food tray on bedside table. Prompted pt about it and offered to help pt set it up to eat; pt declined and went back to sleep.

## 2021-08-12 NOTE — TOC Progression Note (Addendum)
Transition of Care [facility]) - Progression Note    Patient Details  Name: Cheyenne [name] MRN: [id number] Date of Birth: 04/23/1950  Transition of Care [facility]) CM/SW [place], RN Phone Number: 08/12/2021, 10:12 AM  Clinical Narrative:    LVMM for granddaughter with bed offer and need for approval to move forward with discharge to SNF facility. Current bed offer is [facility].    Expected Discharge Plan: Skilled Nursing Facility Barriers to Discharge: SNF Pending bed offer  Expected Discharge Plan and Services Expected Discharge Plan: [place]   Discharge Planning Services: CM Consult Post Acute Care Choice: [place] Living arrangements for the past 2 months: Mobile Home                 DME Arranged: N/A         HH Arranged: NA HH Agency: NA         Social Determinants of Health (SDOH) Interventions    Readmission Risk Interventions    02/03/2021   11:22 AM 01/25/2021    4:22 PM 11/14/2020    4:33 PM  Readmission Risk Prevention Plan  Medication Screening  Complete   Transportation Screening Complete Complete   Home Care Screening     Medication Review (RN CM)     [place] or Home Care Consult Complete    Social Work Consult for [place] Planning/Counseling Complete    Palliative Care Screening Not Applicable    Medication Review Press photographer) Complete       Information is confidential and restricted. Go to Review Flowsheets to unlock data.

## 2021-08-12 NOTE — ED Notes (Signed)
Pt currently awake and eating from food tray.

## 2021-08-12 NOTE — ED Notes (Signed)
Pt had BM in briefs while attempting to get up to bedside toilet alone; bed alarm was set off; peri care provided, briefs changed, linens changed, gown changed, pt had also urinated some in briefs; new purewick applied; pt educated again about purewick and about call bell; teach-back completed in relation to call bell; pt verbalized where call bell is and that she will use it if she needs this RN for anything and when she needs to have BM again; rail up, call bell within reach, stretcher locked low; blinds in room open for visibility for pt's safety.

## 2021-08-12 NOTE — ED Notes (Signed)
Woke pt. Prompted her to eat and drink; pt stated she would try to eat something; placed pillow under pt's R hip; adjusted HOB and table for pt to eat; pt denied feeling like she was wet or needed to urinate; purewick remains in place; pt went to sleep again without eating or drinking.

## 2021-08-12 NOTE — ED Notes (Signed)
Pt set off bed alarm sitting up at side of bed. This RN to bedside; when asked why getting up pt reported "I have to poop". Currently pt's bedside toilet out-of-order so brought mobile bedside commode into room and placed it next to bed for pt. Pt now refusing to try to use toilet despite stating she still needs to go; states she "doesn't like pooping"; denies pain when having BM; pt educated again about location of call bell and to notify this Rn using bell when willing to use bedside commode. Pt states she will. Stretcher locked low; rail up; call bell within reach; door to room cracked in effort to better hear bed alarm; pt remains in room close to nurses station.

## 2021-08-12 NOTE — ED Notes (Signed)
Pt denies feeling like she needs to urinate; purewick remains in place; abdomen soft and not currently distended; very small amount of urine noted in canister; approximately 25cc.

## 2021-08-12 NOTE — ED Notes (Signed)
Patient put on a hospital bed at this time and connected to the Spo2 monitor

## 2021-08-12 NOTE — ED Notes (Signed)
Pt asleep; chest rise and fall noted.

## 2021-08-12 NOTE — ED Notes (Signed)
Pt moved up in the bed. Vs/updated. Pt fell back asleep shortly after.

## 2021-08-12 NOTE — ED Notes (Signed)
Pt continues to work on her lunch tray.

## 2021-08-12 NOTE — ED Notes (Signed)
Pt set off bed alarm again; on arrival to room pt requested to use commode next to bed; pt kept stating over and over "oh God, oh God". When asked if pt was in pain, felt constipated or had burning upon urination pt denied them all; asked why pt stating "oh God" then and pt stated she was unsure why. Pt had large formed BM and urinated. Peri care provided and pt assisted back into bed. Pt reminded to use call bell for assistance using commode. Pure wick removed. Non-slip socks placed on pt's feet as pt had not been wearing pair that was at bedside. Stretcher remains locked low, rail up, call bell within reach, blinds at door remain open for pt's safety.

## 2021-08-12 NOTE — ED Notes (Signed)
Pt repositioned with pillow under L hip.

## 2021-08-13 LAB — CBG MONITORING, ED
Glucose-Capillary: 87 mg/dL (ref 70–99)
Glucose-Capillary: 70 mg/dL (ref 70–99)
Glucose-Capillary: 330 mg/dL — ABNORMAL HIGH (ref 70–99)
Glucose-Capillary: 127 mg/dL — ABNORMAL HIGH (ref 70–99)

## 2021-08-13 MED ORDER — INSULIN ASPART 100 UNIT/ML IJ SOLN
0.0000 [IU] | Freq: Three times a day (TID) | INTRAMUSCULAR | Status: DC
  Administered 2021-08-13: 9 [IU] via SUBCUTANEOUS
  Administered 2021-08-14: 2 [IU] via SUBCUTANEOUS
  Administered 2021-08-14: 3 [IU] via SUBCUTANEOUS
  Administered 2021-08-14: 5 [IU] via SUBCUTANEOUS
  Administered 2021-08-15 (×2): 3 [IU] via SUBCUTANEOUS
  Filled 2021-08-13 (×6): qty 1

## 2021-08-13 MED ORDER — DOCUSATE SODIUM 100 MG PO CAPS: 100.0000 mg | ORAL_CAPSULE | Freq: Two times a day (BID) | ORAL | Status: DC | PRN

## 2021-08-13 MED ORDER — INSULIN ASPART 100 UNIT/ML IJ SOLN
0.0000 [IU] | Freq: Every day | INTRAMUSCULAR | Status: DC
  Administered 2021-08-14 – 2021-08-15 (×2): 3 [IU] via SUBCUTANEOUS
  Filled 2021-08-13 (×2): qty 1

## 2021-08-13 NOTE — TOC Progression Note (Signed)
Transition of Care [facility]) - Progression Note    Patient Details  Name: Cheyenne [name] MRN: [id number] Date of Birth: 1951-02-24  Transition of Care [facility]) CM/SW [place], [place] Phone Number: 08/13/2021, 9:57 AM  Clinical Narrative:     CSW attempted to call pt's grand daughter to discuss the pending bed offer, phone going straight to vm, left message.   Expected Discharge Plan: Skilled Nursing Facility Barriers to Discharge: SNF Pending bed offer  Expected Discharge Plan and Services Expected Discharge Plan: [place]   Discharge Planning Services: CM Consult Post Acute Care Choice: [place] Living arrangements for the past 2 months: Mobile Home                 DME Arranged: N/A         HH Arranged: NA HH Agency: NA         Social Determinants of Health (SDOH) Interventions    Readmission Risk Interventions    02/03/2021   11:22 AM 01/25/2021    4:22 PM 11/14/2020    4:33 PM  Readmission Risk Prevention Plan  Medication Screening  Complete   Transportation Screening Complete Complete   Home Care Screening     Medication Review (RN CM)     [place] or Home Care Consult Complete    Social Work Consult for [place] Planning/Counseling Complete    Palliative Care Screening Not Applicable    Medication Review Press photographer) Complete       Information is confidential and restricted. Go to Review Flowsheets to unlock data.

## 2021-08-13 NOTE — ED Notes (Signed)
Report received from [place], [place]

## 2021-08-13 NOTE — ED Notes (Signed)
Bg 70, edp notified, pt given juice. Pt did not eat breakfast.

## 2021-08-13 NOTE — ED Notes (Signed)
Pt gotten OOB to bedside commode with PT, pt tolerated well.

## 2021-08-13 NOTE — ED Notes (Signed)
Pt sitting up eating, pt tolerating well.

## 2021-08-13 NOTE — Inpatient Diabetes Management (Signed)
Inpatient Diabetes Program Recommendations  AACE/ADA: New Consensus Statement on Inpatient Glycemic Control (2015)  Target Ranges:  Prepandial:   less than 140 mg/dL      Peak postprandial:   less than 180 mg/dL (1-2 hours)      Critically ill patients:  140 - 180 mg/dL   Lab Results  Component Value Date   GLUCAP 70 08/13/2021   HGBA1C 8.4 (H) 06/21/2021    Review of Glycemic Control  Latest Reference Range & Units 08/13/21 09:16 08/13/21 11:33  Glucose-Capillary 70 - 99 mg/dL 87 70   Diabetes history: DM2 Outpatient Diabetes medications: Basagla 40 units QAM, 20 units QHA, Novolog 0-15 units TID and Amaryl 1 mg QD Current orders for Inpatient glycemic control: Semglee 20 units QHS, Novolog 0-15 units TID, Amaryl 1 mg QD  Referral received for DM management while in ED awaiting placement.   Inpatient Diabetes Program Recommendations:   Agree with decreasing to just HS dose of basal, Semglee 20 units QHS  Might also consider:   Semglee 20 units QHS Novolog 0-9 units TID and 0-5 QHS DC Amaryl while waiting on placement in ED  Will continue to follow while inpatient.  Thank you, Reche Dixon, MSN, [place] Diabetes Coordinator Inpatient Diabetes Program [phone] (team pager from 8a-5p)

## 2021-08-13 NOTE — ED Notes (Signed)
Pt. Was given dinner tray and a drink.

## 2021-08-13 NOTE — ED Notes (Addendum)
Pt in bed, sleeping. Purewick not in place, purewick placed.No urine output noted in bed.  Pt sleepy but oriented to self only when asked questions.

## 2021-08-13 NOTE — ED Provider Notes (Signed)
Reviewed recent glucoses.  Patient just now having breakfast, glucose of 70.  Discussed with nurse and noted patient on twice daily glargine.  We will discontinue her morning dosing of glargine, and continue to monitor her glucoses.  I have also placed consultation with diabetes coordinator for additional consult and treatment recommendations moving forward.   Delman Kitten, MD 08/13/21 [phone]

## 2021-08-13 NOTE — Progress Notes (Signed)
Physical Therapy Treatment Patient Details Name: Cheyenne [name] MRN: [id number] DOB: Sep 24, 1950 Today's Date: 08/13/2021   History of Present Illness Cheyenne [name] is a 71 y.o. female with a history of hypertension, diabetes, dementia, prior stroke who was sent to the ED from home due to unwitnessed fall.  She was unable to get up off the floor alone.  Unknown downtime.    PT Comments    Pt awake prior to lunch.  RN in room.  Assisted with juice as FS low but OK to proceed with using BSC at this time.  She is able to get to EOB with min guard and transfer to [facility] with min a x 1 and generally poor safety prior to returning to supine.  She reports being cold with shivering noted.  Further mobility is deferred at this time and she is given a warm blanket and covered as appropriate.   Recommendations for follow up therapy are one component of a multi-disciplinary discharge planning process, led by the attending physician.  Recommendations may be updated based on patient status, additional functional criteria and insurance authorization.  Follow Up Recommendations  Skilled nursing-short term rehab (<3 hours/day)     Assistance Recommended at Discharge Frequent or constant Supervision/Assistance  Patient can return home with the following A little help with walking and/or transfers;A little help with bathing/dressing/bathroom;Assist for transportation;Assistance with cooking/housework;Help with stairs or ramp for entrance;Direct supervision/assist for medications management;Direct supervision/assist for financial management   Equipment Recommendations  None recommended by PT    Recommendations for Other Services       Precautions / Restrictions Precautions Precautions: Fall Restrictions Weight Bearing Restrictions: No     Mobility  Bed Mobility Overal bed mobility: Needs Assistance Bed Mobility: Supine to Sit, Sit to Supine     Supine to sit: Min guard Sit to supine:  Supervision        Transfers Overall transfer level: Needs assistance Equipment used: None Transfers: Sit to/from Stand, Bed to chair/wheelchair/BSC Sit to Stand: Min guard Stand pivot transfers: Min assist         General transfer comment: to commode at bedside    Ambulation/Gait                   Stairs             Wheelchair Mobility    Modified Rankin (Stroke Patients Only)       Balance Overall balance assessment: Needs assistance, History of Falls Sitting-balance support: Feet supported Sitting balance-Leahy Scale: Fair     Standing balance support: Bilateral upper extremity supported, During functional activity, Reliant on assistive device for balance Standing balance-Leahy Scale: Fair                              Cognition Arousal/Alertness: Awake/alert Behavior During Therapy: Flat affect Overall Cognitive Status: No family/caregiver present to determine baseline cognitive functioning                                          Exercises Other Exercises Other Exercises: to commode to void    General Comments        Pertinent Vitals/Pain Pain Assessment Pain Assessment: No/denies pain    Home Living  Prior Function            PT Goals (current goals can now be found in the care plan section) Progress towards PT goals: Progressing toward goals    Frequency    Min 2X/week      PT Plan Current plan remains appropriate    Co-evaluation              AM-PAC PT "6 Clicks" Mobility   Outcome Measure  Help needed turning from your back to your side while in a flat bed without using bedrails?: None Help needed moving from lying on your back to sitting on the side of a flat bed without using bedrails?: A Little Help needed moving to and from a bed to a chair (including a wheelchair)?: A Little Help needed standing up from a chair using your arms (e.g.,  wheelchair or bedside chair)?: A Little Help needed to walk in hospital room?: A Little Help needed climbing 3-5 steps with a railing? : A Lot 6 Click Score: 18    End of Session Equipment Utilized During Treatment: Gait belt Activity Tolerance: Patient limited by fatigue;Other (comment) Patient left: in bed;with call bell/phone within reach;with bed alarm set;with nursing/sitter in room Nurse Communication: Mobility status PT Visit Diagnosis: Muscle weakness (generalized) (M62.81);Difficulty in walking, not elsewhere classified (R26.2);History of falling (Z91.81);Unsteadiness on feet (R26.81)     Time: 2505-3976 PT Time Calculation (min) (ACUTE ONLY): 10 min  Charges:  $Therapeutic Activity: 8-22 mins                   Chesley Noon, PTA 08/13/21, 12:19 PM

## 2021-08-13 NOTE — ED Notes (Signed)
Pt. Was given lunch and a drink.

## 2021-08-13 NOTE — ED Notes (Signed)
Pt helped with one assist to bedside commode and back to bed.

## 2021-08-13 NOTE — ED Notes (Signed)
Pt sitting up and set up for lunch, pt tolerating well.

## 2021-08-14 LAB — CBG MONITORING, ED
Glucose-Capillary: 127 mg/dL — ABNORMAL HIGH (ref 70–99)
Glucose-Capillary: 189 mg/dL — ABNORMAL HIGH (ref 70–99)
Glucose-Capillary: 243 mg/dL — ABNORMAL HIGH (ref 70–99)
Glucose-Capillary: 291 mg/dL — ABNORMAL HIGH (ref 70–99)

## 2021-08-14 NOTE — ED Notes (Signed)
Patient is resting quietly with eyes closed, RR are even and unlabored. Door is open to visualize pt. Siderails up and bed alarm on. Call bell within reach. No acute distress noted

## 2021-08-14 NOTE — Progress Notes (Signed)
Physical Therapy Treatment Patient Details Name: Cheyenne [name] MRN: [id number] DOB: September 05, 1950 Today's Date: 08/14/2021   History of Present Illness Cheyenne [name] is a 71 y.o. female with a history of hypertension, diabetes, dementia, prior stroke who was sent to the ED from home due to unwitnessed fall.  She was unable to get up off the floor alone.  Unknown downtime.    PT Comments    Pt asleep but awakens with cold cloth to face.  Pt noted to be inc Large soft BM but was not aware.  Transferred to [facility] with RW and min guard/assist.  Bath given and RN in to changed linens.  She is then able to walk 35' to ambulance doors and back to room.  Set up for breakfast and encouraged to eat.     Recommendations for follow up therapy are one component of a multi-disciplinary discharge planning process, led by the attending physician.  Recommendations may be updated based on patient status, additional functional criteria and insurance authorization.  Follow Up Recommendations  Skilled nursing-short term rehab (<3 hours/day)     Assistance Recommended at Discharge Frequent or constant Supervision/Assistance  Patient can return home with the following A little help with walking and/or transfers;A little help with bathing/dressing/bathroom;Assist for transportation;Assistance with cooking/housework;Help with stairs or ramp for entrance;Direct supervision/assist for medications management;Direct supervision/assist for financial management   Equipment Recommendations  None recommended by PT    Recommendations for Other Services       Precautions / Restrictions Precautions Precautions: Fall Restrictions Weight Bearing Restrictions: No     Mobility  Bed Mobility Overal bed mobility: Needs Assistance Bed Mobility: Supine to Sit, Sit to Supine     Supine to sit: Min guard, Min assist Sit to supine: Min guard, Min assist   General bed mobility comments: tactile cues for initiation of  tasks    Transfers Overall transfer level: Needs assistance Equipment used: Rolling walker (2 wheels) Transfers: Sit to/from Stand Sit to Stand: Min guard Stand pivot transfers: Min guard, Supervision         General transfer comment: to commode at bedside    Ambulation/Gait Ambulation/Gait assistance: Min guard Gait Distance (Feet): 80 Feet Assistive device: Rolling walker (2 wheels) Gait Pattern/deviations: Decreased step length - right, Decreased step length - left, Step-through pattern, Trunk flexed, Decreased stride length Gait velocity: decreased     General Gait Details: patient generally steady with use of RW and min guard. No lob   Stairs             Wheelchair Mobility    Modified Rankin (Stroke Patients Only)       Balance Overall balance assessment: Needs assistance, History of Falls Sitting-balance support: Feet supported Sitting balance-Leahy Scale: Fair     Standing balance support: Bilateral upper extremity supported, During functional activity, Reliant on assistive device for balance Standing balance-Leahy Scale: Fair Standing balance comment: Reliant on RW and min guard                            Cognition Arousal/Alertness: Awake/alert Behavior During Therapy: Flat affect Overall Cognitive Status: No family/caregiver present to determine baseline cognitive functioning                                          Exercises Other Exercises Other Exercises: to commode  for bathing due to inc BM    General Comments        Pertinent Vitals/Pain Pain Assessment Pain Assessment: No/denies pain    Home Living                          Prior Function            PT Goals (current goals can now be found in the care plan section) Progress towards PT goals: Progressing toward goals    Frequency    Min 2X/week      PT Plan Current plan remains appropriate    Co-evaluation               AM-PAC PT "6 Clicks" Mobility   Outcome Measure  Help needed turning from your back to your side while in a flat bed without using bedrails?: None Help needed moving from lying on your back to sitting on the side of a flat bed without using bedrails?: A Little Help needed moving to and from a bed to a chair (including a wheelchair)?: A Little Help needed standing up from a chair using your arms (e.g., wheelchair or bedside chair)?: A Little Help needed to walk in hospital room?: A Little Help needed climbing 3-5 steps with a railing? : A Little 6 Click Score: 19    End of Session Equipment Utilized During Treatment: Gait belt Activity Tolerance: Patient tolerated treatment well Patient left: in bed;with call bell/phone within reach;with bed alarm set;with nursing/sitter in room Nurse Communication: Mobility status PT Visit Diagnosis: Muscle weakness (generalized) (M62.81);Difficulty in walking, not elsewhere classified (R26.2);History of falling (Z91.81);Unsteadiness on feet (R26.81)     Time: 9741-6384 PT Time Calculation (min) (ACUTE ONLY): 19 min  Charges:  $Gait Training: 8-22 mins                    Chesley Noon, PTA 08/14/21, 10:46 AM

## 2021-08-14 NOTE — ED Notes (Signed)
OT at bedside. Pt soiled brief, sheets and gown after BM. Pt cleaned with assistance by OT. This RN provided clean linen.

## 2021-08-14 NOTE — ED Notes (Signed)
Pt given breakfast tray and drink. Currently sleeping.

## 2021-08-14 NOTE — TOC Progression Note (Signed)
Transition of Care [facility]) - Progression Note    Patient Details  Name: Cheyenne [name] MRN: [id number] Date of Birth: [birth year]  Transition of Care [facility]) CM/SW Contact  Shelbie Hutching, RN Phone Number: 08/14/2021, 9:22 AM  Clinical Narrative:    Called granddaughter Cheyenne [name] this morning to discuss bed offer, no answer, message left for return call.   Expected Discharge Plan: Skilled Nursing Facility Barriers to Discharge: SNF Pending bed offer  Expected Discharge Plan and Services Expected Discharge Plan: [place]   Discharge Planning Services: CM Consult Post Acute Care Choice: [place] Living arrangements for the past 2 months: Mobile Home                 DME Arranged: N/A         HH Arranged: NA HH Agency: NA         Social Determinants of Health (SDOH) Interventions    Readmission Risk Interventions    02/03/2021   11:22 AM 01/25/2021    4:22 PM 11/14/2020    4:33 PM  Readmission Risk Prevention Plan  Medication Screening  Complete   Transportation Screening Complete Complete   Home Care Screening     Medication Review (RN CM)     [place] or Home Care Consult Complete    Social Work Consult for [place] Planning/Counseling Complete    Palliative Care Screening Not Applicable    Medication Review Press photographer) Complete       Information is confidential and restricted. Go to Review Flowsheets to unlock data.

## 2021-08-14 NOTE — TOC Progression Note (Signed)
Transition of Care [facility]) - Progression Note    Patient Details  Name: Cheyenne [name] MRN: [id number] Date of Birth: 04-09-1950  Transition of Care [facility]) CM/SW Contact  Shelbie Hutching, RN Phone Number: 08/14/2021, 1:40 PM  Clinical Narrative:    RNCM was able to speak with patient's granddaughter, Anderson [place], she says her son has had her phone.  Reviewed one bed offer, she will accept the bed offer, informed her that patient can always be moved later to be closer to [place] [facility] that bed offer accepted and that insurance auth can be started.     Expected Discharge Plan: Skilled Nursing Facility Barriers to Discharge: SNF Pending bed offer  Expected Discharge Plan and Services Expected Discharge Plan: [place]   Discharge Planning Services: CM Consult Post Acute Care Choice: [place] Living arrangements for the past 2 months: Mobile Home                 DME Arranged: N/A         HH Arranged: NA HH Agency: NA         Social Determinants of Health (SDOH) Interventions    Readmission Risk Interventions    02/03/2021   11:22 AM 01/25/2021    4:22 PM 11/14/2020    4:33 PM  Readmission Risk Prevention Plan  Medication Screening  Complete   Transportation Screening Complete Complete   Home Care Screening     Medication Review (RN CM)     [place] or Home Care Consult Complete    Social Work Consult for [place] Planning/Counseling Complete    Palliative Care Screening Not Applicable    Medication Review Press photographer) Complete       Information is confidential and restricted. Go to Review Flowsheets to unlock data.

## 2021-08-15 LAB — CBG MONITORING, ED
Glucose-Capillary: 156 mg/dL — ABNORMAL HIGH (ref 70–99)
Glucose-Capillary: 100 mg/dL — ABNORMAL HIGH (ref 70–99)
Glucose-Capillary: 193 mg/dL — ABNORMAL HIGH (ref 70–99)
Glucose-Capillary: 293 mg/dL — ABNORMAL HIGH (ref 70–99)

## 2021-08-15 NOTE — ED Notes (Signed)
Patient given dinner tray.

## 2021-08-15 NOTE — ED Notes (Signed)
Rounding-Patient asleep, respirations noted, bed in lowest position with side rail up, call bell within reach 

## 2021-08-15 NOTE — TOC Progression Note (Signed)
Transition of Care [facility]) - Progression Note    Patient Details  Name: Cheyenne [name] MRN: [id number] Date of Birth: November 17, 1950  Transition of Care [facility]) CM/SW Contact  Shelbie Hutching, RN Phone Number: 08/15/2021, 12:41 PM  Clinical Narrative:    Insurance authorization pending.     Expected Discharge Plan: Skilled Nursing Facility Barriers to Discharge: SNF Pending bed offer  Expected Discharge Plan and Services Expected Discharge Plan: [place]   Discharge Planning Services: CM Consult Post Acute Care Choice: [place] Living arrangements for the past 2 months: Mobile Home                 DME Arranged: N/A         HH Arranged: NA HH Agency: NA         Social Determinants of Health (SDOH) Interventions    Readmission Risk Interventions    02/03/2021   11:22 AM 01/25/2021    4:22 PM 11/14/2020    4:33 PM  Readmission Risk Prevention Plan  Medication Screening  Complete   Transportation Screening Complete Complete   Home Care Screening     Medication Review (RN CM)     [place] or Home Care Consult Complete    Social Work Consult for [place] Planning/Counseling Complete    Palliative Care Screening Not Applicable    Medication Review Press photographer) Complete       Information is confidential and restricted. Go to Review Flowsheets to unlock data.

## 2021-08-15 NOTE — TOC Progression Note (Signed)
Transition of Care [facility]) - Progression Note    Patient Details  Name: Cheyenne [name] MRN: [id number] Date of Birth: Mar 11, 1950  Transition of Care [facility]) CM/SW Contact  Shelbie Hutching, RN Phone Number: 08/15/2021, 4:20 PM  Clinical Narrative:    [place] authorization approved- patient can discharge to [facility] tomorrow.     Expected Discharge Plan: Skilled Nursing Facility Barriers to Discharge: SNF Pending bed offer  Expected Discharge Plan and Services Expected Discharge Plan: [place]   Discharge Planning Services: CM Consult Post Acute Care Choice: [place] Living arrangements for the past 2 months: Mobile Home                 DME Arranged: N/A         HH Arranged: NA HH Agency: NA         Social Determinants of Health (SDOH) Interventions    Readmission Risk Interventions    02/03/2021   11:22 AM 01/25/2021    4:22 PM 11/14/2020    4:33 PM  Readmission Risk Prevention Plan  Medication Screening  Complete   Transportation Screening Complete Complete   Home Care Screening     Medication Review (RN CM)     [place] or Home Care Consult Complete    Social Work Consult for [place] Planning/Counseling Complete    Palliative Care Screening Not Applicable    Medication Review Press photographer) Complete       Information is confidential and restricted. Go to Review Flowsheets to unlock data.

## 2021-08-15 NOTE — ED Notes (Signed)
Pt cleaned of BM, placed in clean brief, linens changes and pt repositioned to position of comfort in bed, No additional needs voiced.

## 2021-08-15 NOTE — ED Notes (Addendum)
Rounding-Patient resting, offered elimination assistance, bed in lowest position with side rail up, call bell within reach 

## 2021-08-15 NOTE — Progress Notes (Signed)
PT Cancellation Note  Patient Details Name: Cheyenne [name] MRN: [id number] DOB: 1951/01/17   Cancelled Treatment:    Reason Eval/Treat Not Completed: Fatigue/lethargy limiting ability to participate (Treatment session attempted.  Patient sleeping soundly; unable to awaken/maintain alertness for participation with session.  Will continue efforts at later time/date as appropriate.)   Amandeep Nesmith H. Owens Shark, PT, DPT, NCS 08/15/21, 12:23 PM [phone]

## 2021-08-15 NOTE — ED Notes (Signed)
Breakfast tray given. °

## 2021-08-15 NOTE — ED Provider Notes (Signed)
    08/15/2021    9:36 PM 08/15/2021    6:30 PM 08/15/2021    5:30 PM  Vitals with BMI  Systolic [phone]  Diastolic 64 59 61  Pulse 75 76 77    Pt resting comfortably. No acute events over the last 24 hours.    Rada Hay, MD 08/16/21 [phone]

## 2021-08-15 NOTE — ED Notes (Signed)
Rounding-Patient resting, offered elimination assistance, bed in lowest position with side rail up, call bell within reach 

## 2021-08-15 NOTE — ED Notes (Signed)
RN to bedside to introduce self to pt. Pt sleeping.  

## 2021-08-16 NOTE — TOC Transition Note (Signed)
Transition of Care [facility]) - CM/SW Discharge Note   Patient Details  Name: Cheyenne [name] MRN: [id number] Date of Birth: [birth year]  Transition of Care [facility]) CM/SW Contact:  Shelbie Hutching, RN Phone Number: 08/16/2021, 9:44 AM   Clinical Narrative:    [place] insurance has approved authorization for SNF- patient will discharge to [facility] in [place], [place]  ED RN will call report to [phone].  ED secretary will set up EMS to transport. Left Granddaughter, Anderson Malta a message that patient discharging.   Final next level of care: Skilled Nursing Facility Barriers to Discharge: Barriers Resolved   Patient Goals and CMS Choice Patient states their goals for this hospitalization and ongoing recovery are:: Granddaughter would like patient placed in LTC- she cannot take care of her at home- her dementia has become too advanced CMS Medicare.gov Compare Post Acute Care list provided to:: Patient Represenative (must comment) Choice offered to / list presented to : Adult Children (Granddaughter)  Discharge Placement              Patient chooses bed at: Other - please specify in the comment section below: ([facility] in [place]) Patient to be transferred to facility by: [facility] Name of family member notified: Granddaughter Anderson Malta Patient and family notified of of transfer: 08/16/21  Discharge Plan and Services   Discharge Planning Services: CM Consult Post Acute Care Choice: [address]          DME Arranged: N/A         HH Arranged: NA [place] Agency: NA        Social Determinants of Health ([place]) Interventions     Readmission Risk Interventions    02/03/2021   11:22 AM 01/25/2021    4:22 PM 11/14/2020    4:33 PM  Readmission Risk Prevention Plan  Medication Screening  Complete   Transportation Screening Complete Complete   Home Care Screening     Medication Review (RN CM)     [place] or Home Care Consult Complete     Social Work Consult for [place] Planning/Counseling Complete    Palliative Care Screening Not Applicable    Medication Review Press photographer) Complete       Information is confidential and restricted. Go to Review Flowsheets to unlock data.

## 2021-08-16 NOTE — ED Provider Notes (Signed)
-----------------------------------------   10:27 AM on 08/16/2021 ----------------------------------------- Patient cleared for discharge to [place], we will arrange transport with EMS.   Blake Divine, MD 08/16/21 1027

## 2021-08-16 NOTE — ED Notes (Signed)
Patient Accepted to Skilled Nursing Facility/ Can be D/C to [facility] today

## 2021-08-16 NOTE — ED Notes (Signed)
[facility] here to take pt to [place]. Pt alert, confuse. IV removed.

## 2021-08-16 NOTE — ED Notes (Signed)
Patient Accepted to Skilled Nursing Facility/ Can be D/C to [facility] today

## 2021-09-18 ENCOUNTER — Ambulatory Visit (INDEPENDENT_AMBULATORY_CARE_PROVIDER_SITE_OTHER): Payer: Medicare HMO

## 2021-09-18 ENCOUNTER — Ambulatory Visit (INDEPENDENT_AMBULATORY_CARE_PROVIDER_SITE_OTHER): Payer: Medicare HMO | Admitting: Physician Assistant

## 2021-09-18 ENCOUNTER — Encounter: Payer: Self-pay | Admitting: Physician Assistant

## 2021-09-18 DIAGNOSIS — M25512 Pain in left shoulder: Secondary | ICD-10-CM | POA: Diagnosis not present

## 2021-09-18 DIAGNOSIS — G8929 Other chronic pain: Secondary | ICD-10-CM | POA: Diagnosis not present

## 2021-09-18 NOTE — Progress Notes (Signed)
Office Visit Note   Patient: Cheyenne [name]           Date of Birth: [birth year]           MRN: [id number] Visit Date: 09/18/2021              Requested by: Toni Arthurs, PA [address] [place],  [place] [address] PCP: Toni Arthurs, [place]  Chief Complaint  Patient presents with   Left Shoulder - New Patient (Initial Visit)      HPI: Patient is a pleasant 71 year old woman who is currently residing in a nursing facility as she had several strokes in the last couple months.  She also has had several falls.  She tends today with ongoing pain and decreased range of motion of her left shoulder.  She does have a history of type 1 diabetes.  Assessment & Plan: Visit Diagnoses:  1. Chronic left shoulder pain     Plan: Findings consistent with concerns for adhesive capsulitis of her left shoulder.  She is very concerned about having a steroid shot as they have caused her difficulty with her blood sugar significantly in the past.  I do think at the nursing facility they do need to work with her daily for range of motion.  Also have recommended a daily anti-inflammatory if this is not contraindicated as she did not present today with her current list of medications.  Follow-up in 1 month for reevaluation  Follow-Up Instructions: No follow-ups on file.   Ortho Exam  Patient is alert, oriented, no adenopathy, well-dressed, normal affect, normal respiratory effort. Examination of her left shoulder no bruising no ecchymosis no swelling she has forward elevation to about 110 degrees and then she gets quite painful cannot progressive for much further passively.  And decreased internal rotation behind her back and external rotation.  She does have some positive impingement findings as well.  Distal arm does not have any paresthesias with good distal pulse  Imaging: XR Shoulder Left  Result Date: 09/18/2021 Radiographs of the left shoulder were obtained today.  No evidence of  dislocation she does have some AC degenerative changes with some hypertrophic bone formation.  No humeral head elevation no acute fractures no evidence of dislocation  No images are attached to the encounter.  Labs: Lab Results  Component Value Date   HGBA1C 8.4 (H) 06/21/2021   HGBA1C 8.2 (H) 01/25/2021   HGBA1C 9.0 (H) 10/25/2020   HGBA1C 9.1 (H) 10/25/2020   REPTSTATUS 06/22/2021 FINAL 06/20/2021   CULT  06/20/2021    NO GROWTH Performed at [facility], [place] [address] [place], [place] [address]      Lab Results  Component Value Date   ALBUMIN 3.5 08/10/2021   ALBUMIN 3.7 06/20/2021   ALBUMIN 3.3 (L) 02/02/2021    Lab Results  Component Value Date   MG 1.7 02/07/2021   MG 1.8 02/07/2021   MG 1.7 02/04/2021   No results found for: "VD25OH"  No results found for: "PREALBUMIN"    Latest Ref Rng & Units 08/10/2021    6:35 AM 06/20/2021    8:47 PM 02/06/2021    3:49 AM  CBC EXTENDED  WBC 4.0 - 10.5 K/uL 7.3  5.5  6.0   RBC 3.87 - 5.11 MIL/uL 4.73  4.71  4.53   Hemoglobin 12.0 - 15.0 g/dL 13.2  13.0  12.3   HCT 36.0 - 46.0 % 40.5  40.5  38.1  Platelets 150 - 400 K/uL 203  264  311   NEUT# 1.7 - 7.7 K/uL 5.5  4.1    Lymph# 0.7 - 4.0 K/uL 1.1  1.0       There is no height or weight on file to calculate BMI.  Orders:  Orders Placed This Encounter  Procedures   XR Shoulder Left   No orders of the defined types were placed in this encounter.    Procedures: No procedures performed  Clinical Data: No additional findings.  ROS:  All other systems negative, except as noted in the HPI. Review of Systems  All other systems reviewed and are negative.   Objective: Vital Signs: There were no vitals taken for this visit.  Specialty Comments:  No specialty comments available.  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 ([place]) 01/24/2021   Protein-calorie malnutrition, severe 11/18/2020   Colitis on CT 11/10/2020   Abdominal pain 11/10/2020   Nausea & vomiting 11/10/2020   AKI (acute kidney injury) ([place]) 11/10/2020   Hyperglycemia due to diabetes mellitus ([place]) 11/10/2020   Leukocytosis 11/10/2020   Mixed hyperlipidemia 11/10/2020   Tobacco use 11/10/2020   Asthma 11/10/2020   SIRS (systemic inflammatory response syndrome) ([place]) 11/10/2020   Restless leg syndrome 11/10/2020   Vitamin D deficiency 11/10/2020   Enteritis 11/10/2020   Acute CVA (cerebrovascular accident) ([place])    Generalized weakness 10/25/2020   Essential hypertension 10/25/2020   Uncontrolled type 2 diabetes mellitus with hyperglycemia, with long-term current use of insulin ([place]) 10/25/2020   Psychiatric problem 10/25/2020   Past Medical History:  Diagnosis Date   Diabetes mellitus without complication ([place])    Endometrial cancer ([place])    details unavailable. brachytherapy seeds within region of cervix on CT imaging 2019   HTN (hypertension)    Peripheral neuropathy    Spinal stenosis    Stroke ([place])    left sided arm weakness    History reviewed. No pertinent family history.  Past Surgical History:  Procedure Laterality Date   APPENDECTOMY     BACK SURGERY  2016   laminectomy and facetectomy   Social History   Occupational History   Not on file  Tobacco Use   Smoking status: Former    Packs/day: 0.50    Types: Cigarettes    Quit date: 10/26/2020    Years since quitting: 0.8   Smokeless tobacco: Never  Vaping Use   Vaping Use: Never used  Substance and Sexual Activity   Alcohol use: Not Currently   Drug use: Never   Sexual activity: Not on file

## 2021-10-03 ENCOUNTER — Telehealth: Payer: Self-pay | Admitting: Physician Assistant

## 2021-10-03 NOTE — Telephone Encounter (Signed)
Cheyenne [name] from [facility] called having some questions about some results from a previous appointment, they asked for her left shoulder to be checked and the results said her right shoulder and they wanted to make sure it was just a typo. If you could give Cheyenne [name] a call back at [phone]

## 2021-10-04 ENCOUNTER — Telehealth: Payer: Self-pay | Admitting: Physician Assistant

## 2021-10-04 NOTE — Telephone Encounter (Signed)
[employer] sales representative) called requesting a call back. Kieth Brightly states she need clarification for orders sent. Please call Kieth Brightly at [phone].

## 2021-10-04 NOTE — Telephone Encounter (Signed)
Patient was last seen in clinic on 09/18/2021 for her left shoulder

## 2021-10-05 NOTE — Telephone Encounter (Signed)
This is being addressed in a different encounter.

## 2021-10-05 NOTE — Telephone Encounter (Signed)
Attempted to contact Kieth Brightly to address questions or concerns however was not able to get answer.

## 2021-10-09 ENCOUNTER — Encounter: Payer: Self-pay | Admitting: Physician Assistant

## 2021-10-09 ENCOUNTER — Ambulatory Visit (INDEPENDENT_AMBULATORY_CARE_PROVIDER_SITE_OTHER): Payer: Medicare HMO | Admitting: Physician Assistant

## 2021-10-09 DIAGNOSIS — M75122 Complete rotator cuff tear or rupture of left shoulder, not specified as traumatic: Secondary | ICD-10-CM

## 2021-10-09 DIAGNOSIS — M7502 Adhesive capsulitis of left shoulder: Secondary | ICD-10-CM | POA: Diagnosis not present

## 2021-10-09 NOTE — Progress Notes (Signed)
Office Visit Note   Patient: Cheyenne [name]           Date of Birth: 04/16/1950           MRN: [id number] Visit Date: 10/09/2021              Requested by: Toni Arthurs, PA [address] [place],  [place] [address] PCP: Darius Bump B, PA  Left shoulder pain    HPI: Patient presents today in follow-up for her left shoulder.  She resides in a nursing facility and has had a history of strokes and several falls.  She is a type I diabetic.  She says physical therapy has been working with her every other day to improve her motion.   Assessment & Plan: Visit Diagnoses:  1. Nontraumatic complete tear of left rotator cuff   2. Adhesive capsulitis of left shoulder     Plan: Patient is a type I diabetic.  And she has had a recent injury.  Certainly she could have a rotator cuff tear but given her limited motion of also concerned about adhesive capsulitis.  She really needs to do therapy daily.  I have discussed with one of her caregivers here the possibility of giving her simple exercises that she can do on her own.  I will go forward and order an MRI to evaluate the rotator cuff and she should follow-up with Dr. Durward Fortes afterwards.  Certainly she has a lot of comorbidities and not sure she would be a good surgical candidate even if her rotator cuff was torn.  If she had a persistent painful frozen shoulder could consider a simple manipulation under anesthesia.  Again she does not want to do any steroids because of her concerns with elevating her blood sugar.  Follow-Up Instructions: No follow-ups on file.   Ortho Exam  Patient is alert, oriented, no adenopathy, well-dressed, normal affect, normal respiratory effort. Examination of her left shoulder she can only forward flex to about 80 degrees.  Cannot internally rotate behind her back both of these are secondary to pain she does seem to have a hard stop.  Consistent with frozen shoulder.  She is right-hand dominant.   Pulses are intact she has limited external rotation  Imaging: No results found. No images are attached to the encounter.  Labs: Lab Results  Component Value Date   HGBA1C 8.4 (H) 06/21/2021   HGBA1C 8.2 (H) 01/25/2021   HGBA1C 9.0 (H) 10/25/2020   HGBA1C 9.1 (H) 10/25/2020   REPTSTATUS 06/22/2021 FINAL 06/20/2021   CULT  06/20/2021    NO GROWTH Performed at [facility], [place] [address] [place], [place] [address]      Lab Results  Component Value Date   ALBUMIN 3.5 08/10/2021   ALBUMIN 3.7 06/20/2021   ALBUMIN 3.3 (L) 02/02/2021    Lab Results  Component Value Date   MG 1.7 02/07/2021   MG 1.8 02/07/2021   MG 1.7 02/04/2021   No results found for: "VD25OH"  No results found for: "PREALBUMIN"    Latest Ref Rng & Units 08/10/2021    6:35 AM 06/20/2021    8:47 PM 02/06/2021    3:49 AM  CBC EXTENDED  WBC 4.0 - 10.5 K/uL 7.3  5.5  6.0   RBC 3.87 - 5.11 MIL/uL 4.73  4.71  4.53   Hemoglobin 12.0 - 15.0 g/dL 13.2  13.0  12.3   HCT 36.0 - 46.0 % 40.5  40.5  38.1   Platelets 150 - 400 K/uL 203  264  311   NEUT# 1.7 - 7.7 K/uL 5.5  4.1    Lymph# 0.7 - 4.0 K/uL 1.1  1.0       There is no height or weight on file to calculate BMI.  Orders:  No orders of the defined types were placed in this encounter.  No orders of the defined types were placed in this encounter.    Procedures: No procedures performed  Clinical Data: No additional findings.  ROS:  All other systems negative, except as noted in the HPI. Review of Systems  Objective: Vital Signs: There were no vitals taken for this visit.  Specialty Comments:  No specialty comments available.  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 ([place]) 01/24/2021   Protein-calorie malnutrition, severe 11/18/2020   Colitis on CT 11/10/2020   Abdominal pain 11/10/2020   Nausea & vomiting 11/10/2020   AKI (acute kidney injury) ([place]) 11/10/2020   Hyperglycemia due to diabetes mellitus ([place]) 11/10/2020   Leukocytosis 11/10/2020   Mixed hyperlipidemia 11/10/2020   Tobacco use 11/10/2020   Asthma 11/10/2020   SIRS (systemic inflammatory response syndrome) ([place]) 11/10/2020   Restless leg syndrome 11/10/2020   Vitamin D deficiency 11/10/2020   Enteritis 11/10/2020   Acute CVA (cerebrovascular accident) ([place])    Generalized weakness 10/25/2020   Essential hypertension 10/25/2020   Uncontrolled type 2 diabetes mellitus with hyperglycemia, with long-term current use of insulin ([place]) 10/25/2020   Psychiatric problem 10/25/2020   Past Medical History:  Diagnosis Date   Diabetes mellitus without complication ([place])    Endometrial cancer ([place])    details unavailable. brachytherapy seeds within region of cervix on CT imaging 2019   HTN (hypertension)    Peripheral neuropathy    Spinal stenosis    Stroke ([place])    left sided arm weakness    History reviewed. No pertinent family history.  Past Surgical History:  Procedure Laterality Date   APPENDECTOMY     BACK SURGERY  2016   laminectomy and facetectomy   Social History   Occupational History   Not on file  Tobacco Use   Smoking status: Former    Packs/day: 0.50    Types: Cigarettes    Quit date: 10/26/2020    Years since quitting: 0.9   Smokeless tobacco: Never  Vaping Use   Vaping Use: Never used  Substance and Sexual Activity   Alcohol use: Not Currently   Drug use: Never   Sexual activity: Not on file

## 2023-04-06 DEATH — deceased
